# Patient Record
Sex: Male | Born: 1945
Health system: Southern US, Community
[De-identification: ages and names within clinical notes are randomized; demographics above are authoritative.]

## PROBLEM LIST (undated history)

## (undated) DIAGNOSIS — K284 Chronic or unspecified gastrojejunal ulcer with hemorrhage: Secondary | ICD-10-CM

## (undated) DIAGNOSIS — I10 Essential (primary) hypertension: Secondary | ICD-10-CM

## (undated) DIAGNOSIS — G510 Bell's palsy: Secondary | ICD-10-CM

## (undated) DIAGNOSIS — K219 Gastro-esophageal reflux disease without esophagitis: Secondary | ICD-10-CM

## (undated) DIAGNOSIS — E669 Obesity, unspecified: Secondary | ICD-10-CM

## (undated) DIAGNOSIS — H9190 Unspecified hearing loss, unspecified ear: Secondary | ICD-10-CM

## (undated) DIAGNOSIS — E119 Type 2 diabetes mellitus without complications: Secondary | ICD-10-CM

## (undated) DIAGNOSIS — E785 Hyperlipidemia, unspecified: Secondary | ICD-10-CM

## (undated) DIAGNOSIS — D509 Iron deficiency anemia, unspecified: Secondary | ICD-10-CM

## (undated) DIAGNOSIS — R0602 Shortness of breath: Secondary | ICD-10-CM

## (undated) DIAGNOSIS — K274 Chronic or unspecified peptic ulcer, site unspecified, with hemorrhage: Secondary | ICD-10-CM

## (undated) HISTORY — PX: FOOT SURGERY: SHX648

## (undated) HISTORY — DX: Unspecified hearing loss, unspecified ear: H91.90

---

## 2002-10-06 ENCOUNTER — Encounter: Payer: Self-pay | Admitting: Emergency Medicine

## 2002-10-06 ENCOUNTER — Emergency Department (HOSPITAL_COMMUNITY): Admission: EM | Admit: 2002-10-06 | Discharge: 2002-10-06 | Payer: Self-pay | Admitting: Emergency Medicine

## 2004-03-21 ENCOUNTER — Emergency Department (HOSPITAL_COMMUNITY): Admission: EM | Admit: 2004-03-21 | Discharge: 2004-03-21 | Payer: Self-pay | Admitting: Emergency Medicine

## 2004-07-06 ENCOUNTER — Ambulatory Visit (HOSPITAL_COMMUNITY): Admission: RE | Admit: 2004-07-06 | Discharge: 2004-07-06 | Payer: Self-pay | Admitting: Internal Medicine

## 2004-08-04 ENCOUNTER — Ambulatory Visit (HOSPITAL_COMMUNITY): Admission: RE | Admit: 2004-08-04 | Discharge: 2004-08-04 | Payer: Self-pay | Admitting: Internal Medicine

## 2004-08-10 ENCOUNTER — Ambulatory Visit (HOSPITAL_COMMUNITY): Admission: RE | Admit: 2004-08-10 | Discharge: 2004-08-10 | Payer: Self-pay | Admitting: Internal Medicine

## 2006-09-28 ENCOUNTER — Encounter: Payer: Self-pay | Admitting: Emergency Medicine

## 2006-09-28 ENCOUNTER — Ambulatory Visit: Payer: Self-pay | Admitting: Cardiology

## 2006-09-28 ENCOUNTER — Inpatient Hospital Stay (HOSPITAL_COMMUNITY): Admission: AD | Admit: 2006-09-28 | Discharge: 2006-09-29 | Payer: Self-pay | Admitting: Cardiology

## 2006-10-06 ENCOUNTER — Ambulatory Visit (HOSPITAL_COMMUNITY): Admission: RE | Admit: 2006-10-06 | Discharge: 2006-10-06 | Payer: Self-pay | Admitting: Internal Medicine

## 2006-10-06 ENCOUNTER — Ambulatory Visit: Payer: Self-pay | Admitting: Internal Medicine

## 2006-10-06 HISTORY — PX: COLONOSCOPY: SHX174

## 2007-02-02 ENCOUNTER — Emergency Department (HOSPITAL_COMMUNITY): Admission: EM | Admit: 2007-02-02 | Discharge: 2007-02-02 | Payer: Self-pay | Admitting: Emergency Medicine

## 2007-05-09 ENCOUNTER — Ambulatory Visit (HOSPITAL_COMMUNITY): Admission: RE | Admit: 2007-05-09 | Discharge: 2007-05-09 | Payer: Self-pay | Admitting: Ophthalmology

## 2007-05-23 ENCOUNTER — Ambulatory Visit (HOSPITAL_COMMUNITY): Admission: RE | Admit: 2007-05-23 | Discharge: 2007-05-23 | Payer: Self-pay | Admitting: Ophthalmology

## 2007-12-05 ENCOUNTER — Ambulatory Visit (HOSPITAL_COMMUNITY): Admission: RE | Admit: 2007-12-05 | Discharge: 2007-12-05 | Payer: Self-pay | Admitting: Internal Medicine

## 2008-05-17 ENCOUNTER — Emergency Department (HOSPITAL_COMMUNITY): Admission: EM | Admit: 2008-05-17 | Discharge: 2008-05-17 | Payer: Self-pay | Admitting: Emergency Medicine

## 2008-11-29 ENCOUNTER — Emergency Department (HOSPITAL_COMMUNITY): Admission: EM | Admit: 2008-11-29 | Discharge: 2008-11-29 | Payer: Self-pay | Admitting: Emergency Medicine

## 2009-09-09 ENCOUNTER — Emergency Department (HOSPITAL_COMMUNITY): Admission: EM | Admit: 2009-09-09 | Discharge: 2009-09-09 | Payer: Self-pay | Admitting: Emergency Medicine

## 2009-12-06 ENCOUNTER — Emergency Department (HOSPITAL_COMMUNITY): Admission: EM | Admit: 2009-12-06 | Discharge: 2009-12-06 | Payer: Self-pay | Admitting: Emergency Medicine

## 2010-01-14 ENCOUNTER — Emergency Department (HOSPITAL_COMMUNITY): Admission: EM | Admit: 2010-01-14 | Discharge: 2010-01-14 | Payer: Self-pay | Admitting: Emergency Medicine

## 2010-01-14 ENCOUNTER — Encounter: Payer: Self-pay | Admitting: Orthopedic Surgery

## 2010-01-15 ENCOUNTER — Encounter (INDEPENDENT_AMBULATORY_CARE_PROVIDER_SITE_OTHER): Payer: Self-pay | Admitting: *Deleted

## 2010-01-15 ENCOUNTER — Ambulatory Visit: Payer: Self-pay | Admitting: Orthopedic Surgery

## 2010-01-15 DIAGNOSIS — S93306A Unspecified dislocation of unspecified foot, initial encounter: Secondary | ICD-10-CM | POA: Insufficient documentation

## 2010-01-21 ENCOUNTER — Encounter: Payer: Self-pay | Admitting: Orthopedic Surgery

## 2010-02-20 ENCOUNTER — Emergency Department (HOSPITAL_COMMUNITY): Admission: EM | Admit: 2010-02-20 | Discharge: 2010-02-20 | Payer: Self-pay | Admitting: Emergency Medicine

## 2010-02-23 ENCOUNTER — Emergency Department (HOSPITAL_COMMUNITY): Admission: EM | Admit: 2010-02-23 | Discharge: 2010-02-23 | Payer: Self-pay | Admitting: Emergency Medicine

## 2010-05-27 ENCOUNTER — Inpatient Hospital Stay (HOSPITAL_COMMUNITY): Admission: EM | Admit: 2010-05-27 | Discharge: 2010-05-29 | Payer: Self-pay | Admitting: Emergency Medicine

## 2010-10-27 NOTE — Letter (Signed)
Summary: History form  History form   Imported By: Ruffin Pyo 01/20/2010 11:44:01  _____________________________________________________________________  External Attachment:    Type:   Image     Comment:   External Document

## 2010-10-27 NOTE — Miscellaneous (Signed)
Summary: [name] appt 01/21/10 245pm, faxed notes  Clinical Lists Changes

## 2010-10-27 NOTE — Consult Note (Signed)
Summary: Consult note from Dr. Romualdo Bolk  Consult note from Dr. Romualdo Bolk   Imported By: Ruffin Pyo 02/03/2010 09:56:26  _____________________________________________________________________  External Attachment:    Type:   Image     Comment:   External Document

## 2010-10-27 NOTE — Letter (Signed)
Summary: Referral faxed to Dr Beola Cord  Referral faxed to Dr Beola Cord   Imported By: Ihor Austin 02/17/2010 18:00:32  _____________________________________________________________________  External Attachment:    Type:   Image     Comment:   External Document

## 2010-10-27 NOTE — Assessment & Plan Note (Signed)
Summary: [facility] ER FOL/UP/FX FOOT/XRAY [facility]/MEDICARE,MEDICAID/CAF   Visit Type:  new patient Referring Provider:  ap er Primary Provider:  Dr. Legrand Rams  CC:  right foot fracture.  History of Present Illness: I saw [name] in the office today for an initial visit.  He is a 65 years old man with the complaint of: right foot fracture.  DOI 01/14/10 slipped on water.  AP er 01/14/10.  Lives at [facility].  independent ambulator with no assistive devices   Meds: Niaspan, Lasix, Benazepril, Ranitidine, Atenolol, Certirizine, ASA, Norco 5, Potassium, Arthritis pain relief, Simvastatin, Eye ointment.  He has been putting some weight on the foot when he moves from chair to bed, etc.  Lisfranc Injury   Allergies (verified): No Known Drug Allergies  Past History:  Past Medical History: htn bells palsy obesity ulcers cholesterol  Past Surgical History: na  Family History: FH of Cancer:  Family History of Diabetes  Social History: Patient is single.  unemployed no smoking no alcohol 2 cups of coffee per day  Review of Systems Constitutional:  Denies weight loss, weight gain, fever, chills, and fatigue. Cardiovascular:  Denies chest pain, palpitations, fainting, and murmurs. Respiratory:  Denies short of breath, wheezing, couch, tightness, pain on inspiration, and snoring . Gastrointestinal:  Complains of diarrhea; denies heartburn, nausea, vomiting, constipation, and blood in your stools. Genitourinary:  Denies frequency, urgency, difficulty urinating, painful urination, flank pain, and bleeding in urine. Neurologic:  Denies numbness, tingling, unsteady gait, dizziness, tremors, and seizure. Musculoskeletal:  Complains of joint pain and swelling; denies instability, stiffness, redness, heat, and muscle pain. Endocrine:  Complains of excessive thirst; denies exessive urination and heat or cold intolerance. Psychiatric:  Denies nervousness, depression, anxiety, and  hallucinations. Skin:  Denies changes in the skin, poor healing, rash, itching, and redness. HEENT:  Denies blurred or double vision, eye pain, redness, and watering. Immunology:  Denies seasonal allergies, sinus problems, and allergic to bee stings. Hemoatologic:  Denies easy bleeding and brusing.  Physical Exam  Msk:  The patient is well developed and nourished, with normal grooming and hygiene. The body habitus is  large  Pulses:  normal PT  weak DP Extremities:  right foot  swelling moderate   tender in the entire mid foot   ankle ROM is normal    The upper extremities have normal appearance, ROM, strength and stability.    No atrophy in the foot   ankle stable   midfoot stability ? based on the fracture  Neurologic:  normal feeling  in the right foot  Skin:  normal  right foot  Psych:  awake alert    Impression & Recommendations:  Problem # 1:  CLOSED DISLOCATION OF FOOT UNSPECIFIED PART (ICD-838.00) Assessment New  The x-rays were done at [facility]. The report and the films have been reviewed.  Lisfranc fracture dislocation right foot     Orders: Orthopedic Surgeon Referral (Ortho Surgeon) New Patient Level III (99203)/ Dr Beola Cord  post splint applied in Neutral   Medications Added to Medication List This Visit: 1)  Norco 5-325 Mg Tabs (Hydrocodone-acetaminophen) .Marland Kitchen.. 1 by mouth q 4 as needed pain  Patient Instructions: 1)  Referral to Dr. Beola Cord for definitive fixation  2)  Please schedule a follow-up appointment as needed. Prescriptions: NORCO 5-325 MG TABS (HYDROCODONE-ACETAMINOPHEN) 1 by mouth q 4 as needed pain  #42 x 5   Entered and Authorized by:   Arther Abbott MD   Signed by:   Arther Abbott MD  on 01/15/2010   Method used:   Print then Give to Patient   RxID:   [id number]

## 2010-12-10 LAB — BASIC METABOLIC PANEL
BUN: 9 mg/dL (ref 6–23)
CO2: 27 mEq/L (ref 19–32)
Calcium: 8.7 mg/dL (ref 8.4–10.5)
Chloride: 102 mEq/L (ref 96–112)
Creatinine, Ser: 1.02 mg/dL (ref 0.4–1.5)
GFR calc Af Amer: >60 mL/min (ref >60)
GFR calc non Af Amer: >60 mL/min (ref >60)
Glucose, Bld: 125 mg/dL — ABNORMAL HIGH (ref 70–99)
Potassium: 3.7 mEq/L (ref 3.5–5.1)
Sodium: 137 mEq/L (ref 135–145)

## 2010-12-10 LAB — DIFFERENTIAL
Basophils Absolute: 0 10*3/uL (ref 0.0–0.1)
Basophils Relative: 0 % (ref 0–1)
Eosinophils Absolute: 0.1 10*3/uL (ref 0.0–0.7)
Eosinophils Relative: 2 % (ref 0–5)
Lymphocytes Relative: 19 % (ref 12–46)
Lymphs Abs: 1.4 10*3/uL (ref 0.7–4.0)
Monocytes Absolute: 0.9 10*3/uL (ref 0.1–1.0)
Monocytes Relative: 12 % (ref 3–12)
Neutro Abs: 5 10*3/uL (ref 1.7–7.7)
Neutrophils Relative %: 67 % (ref 43–77)

## 2010-12-10 LAB — CBC
HCT: 33.4 % — ABNORMAL LOW (ref 39.0–52.0)
Hemoglobin: 10.9 g/dL — ABNORMAL LOW (ref 13.0–17.0)
MCH: 26.1 pg (ref 26.0–34.0)
MCHC: 32.7 g/dL (ref 30.0–36.0)
MCV: 79.8 fL (ref 78.0–100.0)
Platelets: 120 10*3/uL — ABNORMAL LOW (ref 150–400)
RBC: 4.19 MIL/uL — ABNORMAL LOW (ref 4.22–5.81)
RDW: 14.8 % (ref 11.5–15.5)
WBC: 7.4 10*3/uL (ref 4.0–10.5)

## 2010-12-11 LAB — URINALYSIS, ROUTINE W REFLEX MICROSCOPIC
Bilirubin Urine: NEGATIVE
Glucose, UA: NEGATIVE mg/dL
Hgb urine dipstick: NEGATIVE
Ketones, ur: NEGATIVE mg/dL
Leukocytes, UA: NEGATIVE
Nitrite: NEGATIVE
Nitrite: NEGATIVE
Protein, ur: NEGATIVE mg/dL
Protein, ur: NEGATIVE mg/dL
Specific Gravity, Urine: 1.015 (ref 1.005–1.030)
Urobilinogen, UA: 0.2 mg/dL (ref 0.0–1.0)
pH: 5 (ref 5.0–8.0)
pH: 5.5 (ref 5.0–8.0)

## 2010-12-11 LAB — BASIC METABOLIC PANEL
BUN: 14 mg/dL (ref 6–23)
CO2: 25 mEq/L (ref 19–32)
Calcium: 9 mg/dL (ref 8.4–10.5)
Chloride: 99 mEq/L (ref 96–112)
Creatinine, Ser: 1.12 mg/dL (ref 0.4–1.5)
GFR calc Af Amer: >60 mL/min (ref >60)
GFR calc non Af Amer: >60 mL/min (ref >60)
Glucose, Bld: 164 mg/dL — ABNORMAL HIGH (ref 70–99)
Potassium: 3.9 mEq/L (ref 3.5–5.1)
Sodium: 134 mEq/L — ABNORMAL LOW (ref 135–145)

## 2010-12-11 LAB — URINE CULTURE
Culture  Setup Time: 201108311202
Culture: NO GROWTH

## 2010-12-11 LAB — CBC
HCT: 35.9 % — ABNORMAL LOW (ref 39.0–52.0)
Hemoglobin: 11.8 g/dL — ABNORMAL LOW (ref 13.0–17.0)
MCH: 26 pg (ref 26.0–34.0)
MCHC: 32.8 g/dL (ref 30.0–36.0)
MCV: 79.3 fL (ref 78.0–100.0)
Platelets: 134 10*3/uL — ABNORMAL LOW (ref 150–400)
RBC: 4.52 MIL/uL (ref 4.22–5.81)
RDW: 14.2 % (ref 11.5–15.5)
WBC: 13.7 10*3/uL — ABNORMAL HIGH (ref 4.0–10.5)

## 2010-12-11 LAB — DIFFERENTIAL
Basophils Absolute: 0 10*3/uL (ref 0.0–0.1)
Basophils Relative: 0 % (ref 0–1)
Eosinophils Absolute: 0 10*3/uL (ref 0.0–0.7)
Eosinophils Relative: 0 % (ref 0–5)
Lymphocytes Relative: 6 % — ABNORMAL LOW (ref 12–46)
Lymphs Abs: 0.8 10*3/uL (ref 0.7–4.0)
Monocytes Absolute: 0.8 10*3/uL (ref 0.1–1.0)
Monocytes Relative: 6 % (ref 3–12)
Neutro Abs: 12 10*3/uL — ABNORMAL HIGH (ref 1.7–7.7)
Neutrophils Relative %: 88 % — ABNORMAL HIGH (ref 43–77)

## 2010-12-11 LAB — MRSA PCR SCREENING: MRSA by PCR: POSITIVE — AB

## 2010-12-11 LAB — LACTIC ACID, PLASMA: Lactic Acid, Venous: 3.6 mmol/L — ABNORMAL HIGH (ref 0.5–2.2)

## 2010-12-11 LAB — CULTURE, BLOOD (ROUTINE X 2)

## 2010-12-20 LAB — GLUCOSE, CAPILLARY: Glucose-Capillary: 134 mg/dL — ABNORMAL HIGH (ref 70–99)

## 2010-12-29 LAB — DIFFERENTIAL
Basophils Absolute: 0.1 10*3/uL (ref 0.0–0.1)
Lymphocytes Relative: 39 % (ref 12–46)
Monocytes Absolute: 0.6 10*3/uL (ref 0.1–1.0)
Monocytes Relative: 11 % (ref 3–12)
Neutro Abs: 2.6 10*3/uL (ref 1.7–7.7)

## 2010-12-29 LAB — TYPE AND SCREEN
Antibody Screen: POSITIVE
DAT, IgG: NEGATIVE

## 2010-12-29 LAB — BASIC METABOLIC PANEL
Calcium: 8.7 mg/dL (ref 8.4–10.5)
GFR calc Af Amer: >60 mL/min (ref >60)
GFR calc non Af Amer: >60 mL/min (ref >60)
Sodium: 137 mEq/L (ref 135–145)

## 2010-12-29 LAB — CBC
Hemoglobin: 14 g/dL (ref 13.0–17.0)
RBC: 4.62 MIL/uL (ref 4.22–5.81)

## 2011-02-12 NOTE — Op Note (Signed)
[name], [name]                   ACCOUNT NO.:  [id number]   MEDICAL RECORD NO.:  [id number]          PATIENT TYPE:  AMB   LOCATION:  DAY                           FACILITY:  [facility]   PHYSICIAN:  R. Garfield Cornea, M.D. DATE OF BIRTH:  1946/07/07   DATE OF PROCEDURE:  10/06/2006  DATE OF DISCHARGE:                               OPERATIVE REPORT   Screening colonoscopy.   INDICATIONS FOR PROCEDURE:  The patient is a 65 year old gentleman.  He  tells me he has never had a colonoscopy.  He has no lower GI tract  symptoms.  No family history colorectal neoplasia.  He comes for  screening.  He is referred at courtesy of Dr. Legrand Rams.  Screening  colonoscopy is now being done.  This approach has been discussed the  patient at length.  Potential risks, benefits and alternatives have been  reviewed, questions answered.  Please see the documentation in the  medical record.   PROCEDURE NOTE:  O2 saturation, blood pressure, pulse and respiration  were monitored throughout the entire procedure.  Conscious sedation with  Versed 2 mg IV, Demerol 50 mg IV in a single dose.   INSTRUMENT:  Pentax video chip system.   FINDINGS:  Digital rectal exam revealed no abnormalities.   ENDOSCOPIC FINDINGS:  Unfortunately, prep was suboptimal with vegetable  matter and viscous stool intermittently lining the entire colon and  rectum.  This made the exam more difficult, and the finer mucosal detail  of the entire rectal and colonic mucosa was not visualized because of  this deficient prep.   Colon:  Colonic mucosa was surveyed from rectosigmoid junction through  the left, transverse and right colon to the appendiceal orifice,  ileocecal valve and cecum.  These structures were seen and photographed  for the record.  From this level, the scope was slowly withdrawn, and  all previously mentioned mucosal surfaces were again seen.  The colonic  mucosa appeared normal which was seen.  However, because of the poor  prep, we had to turn the patient from side-to-side and wash and suction.  The scope kept getting clogged up.  There were no gross colonic mucosal  abnormalities seen.  Scope was pulled down into the rectum, again same  challenge with the prep.  Retroflexed view of the distal rectal revealed  anal papilla and some internal hemorrhoids.  We moved the patient around  to get the fluid column to move so that the rectal mucosa was seen  fairly well.  However, the prep left a lot to be desired.  The patient  tolerated the procedure well and was reactive to endoscopy.   IMPRESSION:  Grossly normal-appearing rectum and colon aside from  internal hemorrhoids and anal papilla.  Poor prep made the exam more  challenging.  Small polyp or other lesion may not have been seen today  because of the prep.   RECOMMENDATIONS:  Would bring him back a little early, i.e. 5 years, for  repeat screening colonoscopy.      Bridgette Habermann, M.D.  Electronically Signed  RMR/MEDQ  D:  10/06/2006  T:  10/06/2006  Job:  [id number]   cc:   Tesfaye D. Legrand Rams, MD  Fax: [phone]

## 2011-02-12 NOTE — Procedures (Signed)
NAMEJOSEJESUS, [name]                   ACCOUNT NO.:  [id number]   MEDICAL RECORD NO.:  [id number]          PATIENT TYPE:  OUT   LOCATION:  RAD                           FACILITY:  [facility]   PHYSICIAN:  Leslye Peer, MD       DATE OF BIRTH:  11/15/1945   DATE OF PROCEDURE:  DATE OF DISCHARGE:                                  ECHOCARDIOGRAM   REFERRING PHYSICIAN:  Dr. Brandon Melnick D. Fanta.   HISTORY OF PRESENT ILLNESS:  Mr. Billips is a 65 year old gentleman with a past  medical history of hypertension and shortness of breath.   TECHNICAL QUALITY:  The technical quality of the study is adequate.   FINDINGS:  1.  The aorta is within normal limits at 3.6 cm.  2.  The left atrium is mildly dilated at 4.1 cm.  No obvious clots or masses      were appreciated, and the patient appeared to be in sinus rhythm during      this procedure.  3.  The intraventricular septum and posterior wall are mildly thickened at      1.4 cm for each.  There is additionally basal septal hypertrophy noted.  4.  The aortic valve is thin, trileaflet and pliable with normal leaflet      excursion.  No significant aortic insufficiency is noted.  Doppler      interrogation of the aortic valve is within normal limits.  5.  The mitral valve is minimally thickened, but with normal leaflet      excursion.  There is mild prolapse of the posterior leaflet.  There is      an eccentric posteriorly directed jet of mitral regurgitation which      appears mild in most views, but in the apical four-chamber view, does      suggest a more moderate degree.  There is no obvious pulmonary vein flow      reversal noted.  Doppler interrogation of the mitral valve is within      normal limits. Mild mitral annular calcification is noted.  6.  The pulmonic valve appears grossly structurally normal with trivial      pulmonic insufficiency noted.  7.  The tricuspid valve also appears grossly structurally normal with mild      tricuspid regurgitation  noted.  8.  The left ventricle is normal in size with the LVIDD measured at 4.9 cm,      and the LVISD measured at 3.6 cm.  Overall, left ventricular systolic      function appears normal.  In one view only, (apical four-chamber), at      the apex, there is a subtle suggestion of apical hypokinesis.  However,      this is not confirmed in multiple views, and the apex is not well      visualized.  Again, overall left ventricular systolic function is      normal.  The presence of diastolic dysfunction is inferred from pulse      wave Doppler across the mitral valve.  9.  The right  atrium and right ventricle are both mildly generous. The right      ventricular systolic function appears to be preserved.  10. Small anterior echo-free space which may represent fat pad versus a      small anterior pericardial effusion without evidence of hemodynamic      compromise.   IMPRESSION:  1.  Mild biatrial enlargement.  2.  Mild concentric left ventricular hypertrophy.  3.  Mild prolapse of the posterior leaflet of the mitral valve.  4.  An eccentric posteriorly directed jet of mitral regurgitation which      appears mild in most views, but due to its eccentricity, could be      moderate.  5.  Trivial pulmonic insufficiency.  6.  Mild mitral annular calcification.  7.  Mild tricuspid regurgitation.  8.  Normal left ventricular size with overall normal ejection fraction.  In      the apical four-chamber view only, the apex, although not well      visualized, has a subtle suggestion of hypokinesis which is not      appreciated in any other view.  9.  The presence of diastolic dysfunction is inferred from pulse wave      Doppler across the mitral valve.  10. The right ventricle is mildly generous but with preserved right      ventricular systolic function.  11. Small anterior echo-free space which may represent fat pad versus a      small anterior pericardial effusion without evidence of  hemodynamic      compromise.      AB/MEDQ  D:  08/04/2004  T:  08/04/2004  Job:  [id number]   cc:   Tesfaye D. Legrand Rams, [address]  [place]  [place] [address]  Fax: [phone]

## 2011-02-12 NOTE — H&P (Signed)
[name], BOULOS NO.:  [id number]   MEDICAL RECORD NO.:  [id number]          PATIENT TYPE:  INP   LOCATION:  [id number]                         FACILITY:  [facility]   PHYSICIAN:  Ethelle Lyon, MD  DATE OF BIRTH:  08/18/1946   DATE OF ADMISSION:  09/28/2006  DATE OF DISCHARGE:                              HISTORY & PHYSICAL   SUMMARY OF HISTORY:  [name] is a 65 year old white male who was a  resident of [facility] in [place], [place]. He  was transported via [facility] from [facility] to [facility] for cardiac evaluation.  According to [name], he describes  and points to the epigastric xiphoid area and describes a sudden onset  of stabbing discomfort between breakfast and lunch today. He states the  discomfort did not radiate, nor was it associated with and shortness of  breath, nausea, vomiting, diaphoresis, burping, flatulence. He denies  prior occurrences. He denies recent accidents, injuries, lifting  problems, or falls. He is not specific as to the time of onset, except  that it occurred between breakfast and lunch. He is not clear about the  duration, but he states that it was gone before the ambulance arrived to  [facility]. For breakfast he ate his usual eggs and toast  without difficulty.  He denies any exertional fatigue, shortness of  breath, or chest discomfort.  He states that he has been residing in the  nursing home since his parents' death.  It is not clear why he resides  in nursing home from reports that are available.  History is somewhat  limited.   According to [facility] Room, he was having discomfort upon  their arrival, and it was relieved with one sublingual nitroglycerin.  However, on questioning, [name] does not recall receiving any  medications at the emergency room.   PAST MEDICAL HISTORY:  No known drug allergies.   MEDICATIONS:  Based on the nursing home list,  include:  1. Albuterol inhaler q.i.d.  2. Tylenol b.i.d.  3. Atenolol 50 mg daily.  4. Lotensin 10 mg daily.  5. Lasix 40 mg daily.  6. Claritin 10 mg daily.  7. Niaspan 500 mg at bedtime.  8. Zantac 150 daily.  9. Aspirin 81 daily.  10.Zetia 10 mg daily.  11.DuoNebs p.r.n. shortness of breath.   HIS PAST MEDICAL HISTORY:  1. Hypertension.  2. Bell's palsy.  3. Obesity.  4. Hyperlipidemia.  5. He states that he was hospitalized at [facility] long time      ago for bleeding ulcers.  6. He denies any surgeries.  7. He denies any history of diabetes, CVA, myocardial infarction,      COPD, bleeding dyscrasias, or thyroid disorder.   SOCIAL HISTORY:  He resides in [place] at [facility].  He is on disability.  He has never worked. He has no children.  He  states that he smoked a long time ago, approximately one pack per day,  for unknown duration.  He denies any  alcohol or drugs, herbal  medications.  The nursing home maintains a low salt diet.  He states  that the nursing home makes him exercises every day.   FAMILY HISTORY:  Both his parents are deceased.  He states that his  mother was in a nursing home, and his father was killed in a car  accident. Ages and history are unknown.  He states that he had four  brothers and sisters that are all deceased with some type of blood  disease.   REVIEW OF SYSTEMS:  In addition to the above, is notable for edentulous,  positive snoring, nocturia, rare diarrhea.   PHYSICAL EXAMINATION:  GENERAL:  Well-nourished, well-developed,  pleasant obese white male in no apparent distress.  VITAL SIGNS:  Temperature 97.4. Blood pressure initially at [facility]  was 150/84, is now 126/76.  Pulse rate was 91, now at this time it is  approximately 76. Respirations 80, 97% saturation on room air.  HEENT:  Grossly unremarkable, except for edentulous.  NECK:  Supple, without thyromegaly, masses, JVD, or carotid bruits.  CHEST:   Symmetrical excursion.  Lungs sounds were diminished but clear  to auscultation.  '  HEART:  PMI is not displaced.  He has a regular rate and rhythm, with  somewhat distant heart sounds.  I did not appreciate any rubs, clicks,  gallops, S3, or S4.  All peripheral pulses are symmetrical and intact.  I did not appreciate any abdominal or femoral bruits.  SKIN/INTEGUMENT:  Appears to be intact.  ABDOMEN:  Obese.  Bowel sounds present, without organomegaly, masses, or  tenderness.  EXTREMITIES:  Negative cyanosis or clubbing.  He does have approximately  1+ pitting lower extremity edema.  MUSCULOSKELETAL:  Grossly unremarkable.  NEUROLOGIC:  Alert and oriented.  Cranial nerves II-XII are grossly  intact.   At [facility], chest x-ray did not show any active disease.  EKG initially shows a lot of artifact, but appears to be sinus rhythm,  with a ventricular rate and 90, left axis deviation, early R, no acute  changes.  Second EKG shows limb lead reversal, but no acute changes.  H&H is 15.8 and 45.2, normal indices, platelets 167, WBC 6.8. Sodium  136, potassium 3.6, BUN 13, creatinine 1.1, glucose 102, calcium 9.2.  Point of care marker negative x1.   IMPRESSION:  1. Atypical chest or abdominal discomfort, with a negative EKG and      point of care marker. Second EKG is incorrectly applied.  2. Hypertension, improved.  3. History per past medical history.   DISPOSITION:  Dr. Albertine Patricia reviewed the patient's history, spoke with, and  examined the patient, and agrees with the above. We will keep him  overnight to rule out myocardial infarction. Continue his home  medications.  Will discontinue the IV heparin, as this does not sound  ischemic.  Will order a right upper quadrant ultrasound to rule out  cholelithiasis.  Involve the case manager for discharge needs, as he  comes from a nursing home..  We will perform an adenosine Myoview in the morning if his enzymes are negative.  If  this adenosine Myoview is  negative,  anticipate discharge back to [facility].  We will give him  an extra dose of Lasix to assist with his pedal edema and recheck a  potassium in the morning.  He is on Zantac at home.  We will change this  to Protonix 40 mg p.o. daily.  Sharyl Nimrod, PA-C      Ethelle Lyon, MD  Electronically Signed    EW/MEDQ  D:  09/28/2006  T:  09/28/2006  Job:  [id number]   cc:   Tesfaye D. Legrand Rams, MD

## 2011-02-12 NOTE — Discharge Summary (Signed)
[name], PADGET                   ACCOUNT NO.:  [id number]   MEDICAL RECORD NO.:  [id number]          PATIENT TYPE:  INP   LOCATION:  [id number]                         FACILITY:  [facility]   PHYSICIAN:  [name], M.D.             DATE OF BIRTH:  20-Mar-1946   DATE OF ADMISSION:  09/28/2006  DATE OF DISCHARGE:  09/29/2006                               DISCHARGE SUMMARY   DISCHARGE DIAGNOSES:  1. Chest discomfort of uncertain etiology.  2. Hypertension.  3. Dyslipidemia with significantly elevated triglycerides.  4. Unknown history requiring nursing home admission post parents      death.   SUMMARY OF HISTORY:  [name] is a 65 year old white male who is a  resident of [facility] in [place], [place].  He  was transported via ambulance from the nursing home to [facility] with reported chest discomfort and then from [facility] to [facility] for further cardiac evaluation.  However, according to Mr.  Howren he described and pointed at the gastric xiphoid area described a  sudden onset of stabbing sensation sometime between breakfast and lunch  on the day of admission.  The discomfort did not radiate, nor was it  associated with shortness of breath, nausea, vomiting, diaphoresis,  burping or flatulence.  He denied prior occurrences, recent injuries.  The patient is not clear the time of onset or the duration, however, he  states that it was gone prior to arriving to [facility].  Based  on records that are available it is not clear why he is living in a  nursing home.  He states that when his parents died he was moved to the  nursing home.   PAST MEDICAL HISTORY:  1. Hypertension.  2. Bell's palsy.  3. Obesity.  4. Hyperlipidemia.  5. Remote bleeding ulcers requiring hospitalization at [facility] per the patient.  6. Remote tobacco use.   LABORATORY DATA:  Chest x-ray did not show any acute findings.  Abdominal ultrasound on the  third did not show any evidence of  gallstones or biliary dilatation.  Diffuse fatty infiltration of the  liver.  Stress Myoview performed on the third showed EF of 54%, normal  wall motion and no findings of ischemia.  EKG's from [facility]  showed baseline artifact on the initial EKG, normal sinus rhythm, left  axis deviation. No acute abnormalities.  The second EKG from [facility] did not show any acute abnormalities, but it was noted that the  arm leads were reversed.  Admission weight was not documented.  H&H was  15.8 and 45.2.  Normal indices.  Platelets 167, WBC's 6.8, PTT is 66, PT  14.1, sodium 135, potassium 3.5, BUN 13, creatinine 0.83.  Normal LFTs  except for an AST was slightly elevated at 44.  Amylase and lipase were  36 and 23 respectively.  Hemoglobin A1C 5.6.  CK-MB's relative index and  troponin's were negative for myocardial infarction.  Fasting lipids  showed a total cholesterol of 114, triglycerides 583, HDL 27, LDL was  not calculated.  TSH was 2.02.   HOSPITAL COURSE:  [name] was admitted to 38 for further evaluation of  his chest discomfort.  Overnight he ruled out for myocardial infarction.  Did not have any further chest or abdominal discomfort.  Abdominal  ultrasound and Adenosine Myoview were performed with the previously  mentioned results.  It is felt that if these were unremarkable that he  could be discharged back to the nursing home with outpatient follow up  with Dr. Legrand [name].   DISPOSITION:  The patient is discharged back home to the [facility] home. His medications remain unchanged.  These include albuterol  inhaler 4x a day, Tylenol b.i.d., Atenolol 50 mg daily, Lotensin 10 mg  daily, Lasix 40 mg daily, Claritin 10 mg daily, Niaspan 500 mg q.h.s.,  Zantac 150 mg daily, aspirin 81 mg daily, Zetia 10 mg daily and DuoNeb  as needed for shortness of breath as previously.  We will also add a  baby aspirin given his cardiac  risk factors.  He is asked to call Dr.  Legrand [name] to arrange follow up appointment to follow up on hypertension and  his hypertriglyceridemia.  Consideration should be given to increasing  his Niaspan to 1000 mg q.h.s. and considering adding Tricor.   DISCHARGE TIME:  Twenty-five minutes.      Douglas Nimrod, PA-C    ______________________________  [name], M.D.    EW/MEDQ  D:  09/29/2006  T:  09/29/2006  Job:  [id number]   cc:   Douglas D. Legrand Rams, MD  Douglas Bjornstad, MD, [facility]

## 2011-03-25 ENCOUNTER — Ambulatory Visit (HOSPITAL_COMMUNITY)
Admission: RE | Admit: 2011-03-25 | Discharge: 2011-03-25 | Disposition: A | Discharge status: Home / Self Care | Payer: Medicaid Other | Source: Ambulatory Visit | Attending: Internal Medicine | Admitting: Internal Medicine

## 2011-03-25 ENCOUNTER — Other Ambulatory Visit (HOSPITAL_COMMUNITY): Payer: Self-pay | Admitting: Internal Medicine

## 2011-03-25 DIAGNOSIS — M25569 Pain in unspecified knee: Secondary | ICD-10-CM | POA: Insufficient documentation

## 2011-04-01 ENCOUNTER — Encounter (HOSPITAL_COMMUNITY): Payer: Self-pay | Admitting: Radiology

## 2011-04-01 ENCOUNTER — Emergency Department (HOSPITAL_COMMUNITY): Payer: Medicare Other

## 2011-04-01 ENCOUNTER — Emergency Department (HOSPITAL_COMMUNITY)
Admission: EM | Admit: 2011-04-01 | Discharge: 2011-04-01 | Disposition: A | Discharge status: Home / Self Care | Payer: Medicare Other | Attending: Emergency Medicine | Admitting: Emergency Medicine

## 2011-04-01 DIAGNOSIS — Y9210 Unspecified residential institution as the place of occurrence of the external cause: Secondary | ICD-10-CM | POA: Insufficient documentation

## 2011-04-01 DIAGNOSIS — S20219A Contusion of unspecified front wall of thorax, initial encounter: Secondary | ICD-10-CM | POA: Insufficient documentation

## 2011-04-01 DIAGNOSIS — R296 Repeated falls: Secondary | ICD-10-CM | POA: Insufficient documentation

## 2011-04-01 DIAGNOSIS — I10 Essential (primary) hypertension: Secondary | ICD-10-CM | POA: Insufficient documentation

## 2011-04-01 DIAGNOSIS — R109 Unspecified abdominal pain: Secondary | ICD-10-CM | POA: Insufficient documentation

## 2011-04-01 HISTORY — DX: Essential (primary) hypertension: I10

## 2011-04-01 LAB — CBC
MCH: 26.1 pg (ref 26.0–34.0)
Platelets: 166 10*3/uL (ref 150–400)
RBC: 4.67 MIL/uL (ref 4.22–5.81)
RDW: 14.9 % (ref 11.5–15.5)

## 2011-04-01 LAB — BASIC METABOLIC PANEL
CO2: 28 mEq/L (ref 19–32)
Calcium: 9.5 mg/dL (ref 8.4–10.5)
Creatinine, Ser: 1.04 mg/dL (ref 0.50–1.35)
GFR calc Af Amer: >60 mL/min (ref >60)
GFR calc non Af Amer: >60 mL/min (ref >60)
Sodium: 139 mEq/L (ref 135–145)

## 2011-04-01 LAB — DIFFERENTIAL
Basophils Relative: 0 % (ref 0–1)
Eosinophils Absolute: 0 10*3/uL (ref 0.0–0.7)
Eosinophils Relative: 1 % (ref 0–5)
Monocytes Relative: 12 % (ref 3–12)
Neutrophils Relative %: 62 % (ref 43–77)

## 2011-04-01 MED ORDER — IOHEXOL 300 MG/ML  SOLN
100.0000 mL | Freq: Once | INTRAMUSCULAR | Status: AC | PRN
  Administered 2011-04-01: 100 mL via INTRAVENOUS

## 2011-04-08 ENCOUNTER — Other Ambulatory Visit (HOSPITAL_COMMUNITY): Payer: Self-pay | Admitting: Orthopaedic Surgery

## 2011-04-08 DIAGNOSIS — M25562 Pain in left knee: Secondary | ICD-10-CM

## 2011-04-12 ENCOUNTER — Ambulatory Visit (HOSPITAL_COMMUNITY)
Admission: RE | Admit: 2011-04-12 | Discharge: 2011-04-12 | Disposition: A | Discharge status: Home / Self Care | Payer: Medicare Other | Source: Ambulatory Visit | Attending: Orthopaedic Surgery | Admitting: Orthopaedic Surgery

## 2011-04-12 DIAGNOSIS — M7120 Synovial cyst of popliteal space [Baker], unspecified knee: Secondary | ICD-10-CM | POA: Insufficient documentation

## 2011-04-12 DIAGNOSIS — M25569 Pain in unspecified knee: Secondary | ICD-10-CM | POA: Insufficient documentation

## 2011-04-12 DIAGNOSIS — M23329 Other meniscus derangements, posterior horn of medial meniscus, unspecified knee: Secondary | ICD-10-CM | POA: Insufficient documentation

## 2011-04-12 DIAGNOSIS — M25562 Pain in left knee: Secondary | ICD-10-CM

## 2011-05-28 ENCOUNTER — Other Ambulatory Visit (HOSPITAL_COMMUNITY): Payer: Self-pay | Admitting: Orthopaedic Surgery

## 2011-05-28 DIAGNOSIS — M25561 Pain in right knee: Secondary | ICD-10-CM

## 2011-05-29 ENCOUNTER — Emergency Department (HOSPITAL_COMMUNITY)
Admission: EM | Admit: 2011-05-29 | Discharge: 2011-05-29 | Disposition: A | Discharge status: Other Acute Inpatient Hospital | Payer: Medicare Other | Attending: Emergency Medicine | Admitting: Emergency Medicine

## 2011-05-29 ENCOUNTER — Emergency Department (HOSPITAL_COMMUNITY): Payer: Medicare Other

## 2011-05-29 ENCOUNTER — Encounter (HOSPITAL_COMMUNITY): Payer: Self-pay

## 2011-05-29 DIAGNOSIS — M542 Cervicalgia: Secondary | ICD-10-CM

## 2011-05-29 DIAGNOSIS — I10 Essential (primary) hypertension: Secondary | ICD-10-CM | POA: Insufficient documentation

## 2011-05-29 DIAGNOSIS — M549 Dorsalgia, unspecified: Secondary | ICD-10-CM

## 2011-05-29 DIAGNOSIS — M545 Low back pain, unspecified: Secondary | ICD-10-CM | POA: Insufficient documentation

## 2011-05-29 DIAGNOSIS — W1789XA Other fall from one level to another, initial encounter: Secondary | ICD-10-CM | POA: Insufficient documentation

## 2011-05-29 DIAGNOSIS — Z9181 History of falling: Secondary | ICD-10-CM

## 2011-05-29 DIAGNOSIS — Y9210 Unspecified residential institution as the place of occurrence of the external cause: Secondary | ICD-10-CM | POA: Insufficient documentation

## 2011-05-29 HISTORY — DX: Shortness of breath: R06.02

## 2011-05-29 HISTORY — DX: Bell's palsy: G51.0

## 2011-05-29 HISTORY — DX: Obesity, unspecified: E66.9

## 2011-05-29 HISTORY — DX: Hyperlipidemia, unspecified: E78.5

## 2011-05-29 HISTORY — DX: Chronic or unspecified gastrojejunal ulcer with hemorrhage: K28.4

## 2011-05-29 HISTORY — DX: Chronic or unspecified peptic ulcer, site unspecified, with hemorrhage: K27.4

## 2011-05-29 NOTE — ED Notes (Signed)
Pt reports pain in back of neck and upper back after a reported fall while in the bathroom.  Pt denies any LOC or hitting his head when falling.  Pt awake and alert at this time.

## 2011-05-29 NOTE — ED Notes (Signed)
Pt from [facility] for fall today. Pt states he was getting off of toilet when his legs "gave out" pt states he fell onto bathroom floor backwards. Denies hitting head. C/o neck and lower back pain. Denies loc. Pt fully immobilized with c-collar upon arrival. Pt a/o x3. Behavior age appropriate.

## 2011-05-29 NOTE — ED Provider Notes (Signed)
History     CSN: [id number] Arrival date & time: 05/29/2011  7:58 AM  Chief Complaint  Patient presents with  . Fall   Patient is a 65 y.o. male presenting with fall. The history is provided by the patient and the nursing home (pt fell and hurt his back today.  no loc).  Fall The accident occurred 1 to 2 hours ago. The fall occurred while walking. He fell from a height of 1 to 2 ft. He landed on a hard floor. Point of impact: back. Pain location: low back and neck. The pain is at a severity of 2/10. The pain is mild. He was not ambulatory at the scene. Pertinent negatives include no abdominal pain, no bowel incontinence, no nausea, no hematuria and no headaches. Treatment on scene includes a c-collar and a backboard. He has tried nothing for the symptoms.    Past Medical History  Diagnosis Date  . Hypertension   . Obesity   . Bell's palsy   . Bleeding ulcer   . SOB (shortness of breath) on exertion   . Hyperlipemia     Past Surgical History  Procedure Date  . Foot surgery     No family history on file.  History  Substance Use Topics  . Smoking status: Passive Smoker  . Smokeless tobacco: Not on file  . Alcohol Use: No      Review of Systems  Constitutional: Negative for fatigue.  HENT: Negative for congestion, sinus pressure and ear discharge.   Eyes: Negative for discharge.  Respiratory: Negative for cough.   Cardiovascular: Negative for chest pain.  Gastrointestinal: Negative for nausea, abdominal pain, diarrhea and bowel incontinence.  Genitourinary: Negative for frequency and hematuria.  Musculoskeletal: Positive for back pain.       Neck pain  Skin: Negative for rash.  Neurological: Negative for seizures and headaches.  Hematological: Negative.   Psychiatric/Behavioral: Negative for hallucinations.    Physical Exam  BP 131/69  Pulse 87  Temp(Src) 97.6 F (36.4 C) (Oral)  Resp 20  Wt 275 lb (124.739 kg)  SpO2 92%  Physical Exam  Constitutional: He  is oriented to person, place, and time. He appears well-developed.  HENT:  Head: Normocephalic and atraumatic.  Eyes: Conjunctivae and EOM are normal. No scleral icterus.  Neck: Neck supple. No thyromegaly present.  Cardiovascular: Normal rate and regular rhythm.  Exam reveals no gallop and no friction rub.   No murmur heard. Pulmonary/Chest: No stridor. He has no wheezes. He has no rales. He exhibits no tenderness.  Abdominal: He exhibits no distension. There is no tenderness. There is no rebound.  Musculoskeletal: Normal range of motion. He exhibits no edema.       Tender pos neck and mild lumbar tendernousl  Lymphadenopathy:    He has no cervical adenopathy.  Neurological: He is oriented to person, place, and time. He displays normal reflexes. No cranial nerve deficit. He exhibits normal muscle tone. Coordination normal.  Skin: No rash noted. No erythema.  Psychiatric: He has a normal mood and affect. His behavior is normal.    ED Course  Procedures  MDM Fall,  Lumbar and cervicle strain  Results for orders placed during the hospital encounter of 04/01/11  DIFFERENTIAL      Component Value Range   Neutrophils Relative 62  43 - 77 (%)   Neutro Abs 3.8  1.7 - 7.7 (K/uL)   Lymphocytes Relative 25  12 - 46 (%)   Lymphs Abs 1.5  0.7 - 4.0 (K/uL)   Monocytes Relative 12  3 - 12 (%)   Monocytes Absolute 0.7  0.1 - 1.0 (K/uL)   Eosinophils Relative 1  0 - 5 (%)   Eosinophils Absolute 0.0  0.0 - 0.7 (K/uL)   Basophils Relative 0  0 - 1 (%)   Basophils Absolute 0.0  0.0 - 0.1 (K/uL)  CBC      Component Value Range   WBC 6.0  4.0 - 10.5 (K/uL)   RBC 4.67  4.22 - 5.81 (MIL/uL)   Hemoglobin 12.2 (*) 13.0 - 17.0 (g/dL)   HCT 37.8 (*) 39.0 - 52.0 (%)   MCV 80.9  78.0 - 100.0 (fL)   MCH 26.1  26.0 - 34.0 (pg)   MCHC 32.3  30.0 - 36.0 (g/dL)   RDW 14.9  11.5 - 15.5 (%)   Platelets 166  150 - 400 (K/uL)  BASIC METABOLIC PANEL      Component Value Range   Sodium 139  135 - 145  (mEq/L)   Potassium 4.3  3.5 - 5.1 (mEq/L)   Chloride 102  96 - 112 (mEq/L)   CO2 28  19 - 32 (mEq/L)   Glucose, Bld 110 (*) 70 - 99 (mg/dL)   BUN 17  6 - 23 (mg/dL)   Creatinine, Ser 1.04  0.50 - 1.35 (mg/dL)   Calcium 9.5  8.4 - 10.5 (mg/dL)   GFR calc non Af Amer >60  >60 (mL/min)   GFR calc Af Amer >60  >60 (mL/min)   Dg Cervical Spine Complete  05/29/2011  *RADIOLOGY REPORT*  Clinical Data: [place].  CERVICAL SPINE - COMPLETE 4+ VIEW  Comparison: Cervical spine series 05/17/2008.  Findings: Mild degenerative cervical spondylosis.  Normal alignment and no acute bony findings.  The C1-2 articulations are maintained. The lung apices are clear.  IMPRESSION: Normal alignment and no acute bony findings.  Original Report Authenticated By: P. Kalman Jewels, M.D.   Dg Lumbar Spine Complete  05/29/2011  *RADIOLOGY REPORT*  Clinical Data: [place].  Back pain.  LUMBAR SPINE - COMPLETE 4+ VIEW  Comparison: CT scan 04/01/2011.  Findings: Severe degenerative lumbar spondylosis with severe disc disease and facet disease.  No definite acute bony findings.  The visualized bony pelvis is grossly intact.  IMPRESSION:  1.  Severe degenerative lumbar spondylosis. 2.  No definite acute fracture.  Original Report Authenticated By: P. Kalman Jewels, M.D.        Maudry Diego, MD 05/29/11 [id number]

## 2011-05-29 NOTE — ED Notes (Signed)
Dr. Roderic Palau in and examined pt. Pt removed for lsb. c-collar still intact.

## 2011-06-02 ENCOUNTER — Ambulatory Visit (HOSPITAL_COMMUNITY)
Admission: RE | Admit: 2011-06-02 | Discharge: 2011-06-02 | Disposition: A | Discharge status: Home / Self Care | Payer: Medicare Other | Source: Ambulatory Visit | Attending: Orthopaedic Surgery | Admitting: Orthopaedic Surgery

## 2011-06-02 DIAGNOSIS — M25561 Pain in right knee: Secondary | ICD-10-CM

## 2011-06-02 DIAGNOSIS — M23329 Other meniscus derangements, posterior horn of medial meniscus, unspecified knee: Secondary | ICD-10-CM | POA: Insufficient documentation

## 2011-06-02 DIAGNOSIS — M25569 Pain in unspecified knee: Secondary | ICD-10-CM | POA: Insufficient documentation

## 2011-06-02 DIAGNOSIS — M25469 Effusion, unspecified knee: Secondary | ICD-10-CM | POA: Insufficient documentation

## 2011-06-15 ENCOUNTER — Encounter (HOSPITAL_COMMUNITY)
Admission: RE | Admit: 2011-06-15 | Discharge: 2011-06-15 | Disposition: A | Discharge status: Home / Self Care | Payer: Medicare Other | Source: Ambulatory Visit | Attending: Orthopaedic Surgery | Admitting: Orthopaedic Surgery

## 2011-06-15 ENCOUNTER — Emergency Department (HOSPITAL_COMMUNITY): Payer: Medicare Other

## 2011-06-15 ENCOUNTER — Encounter (HOSPITAL_COMMUNITY): Payer: Self-pay

## 2011-06-15 ENCOUNTER — Emergency Department (HOSPITAL_COMMUNITY)
Admission: EM | Admit: 2011-06-15 | Discharge: 2011-06-15 | Disposition: A | Discharge status: Other Acute Inpatient Hospital | Payer: Medicare Other | Attending: Emergency Medicine | Admitting: Emergency Medicine

## 2011-06-15 ENCOUNTER — Encounter (HOSPITAL_COMMUNITY): Payer: Self-pay | Admitting: *Deleted

## 2011-06-15 ENCOUNTER — Other Ambulatory Visit: Payer: Self-pay

## 2011-06-15 DIAGNOSIS — Y9210 Unspecified residential institution as the place of occurrence of the external cause: Secondary | ICD-10-CM | POA: Insufficient documentation

## 2011-06-15 DIAGNOSIS — Z7982 Long term (current) use of aspirin: Secondary | ICD-10-CM | POA: Insufficient documentation

## 2011-06-15 DIAGNOSIS — S39012A Strain of muscle, fascia and tendon of lower back, initial encounter: Secondary | ICD-10-CM

## 2011-06-15 DIAGNOSIS — M545 Low back pain, unspecified: Secondary | ICD-10-CM | POA: Insufficient documentation

## 2011-06-15 DIAGNOSIS — F0390 Unspecified dementia without behavioral disturbance: Secondary | ICD-10-CM | POA: Insufficient documentation

## 2011-06-15 DIAGNOSIS — M5136 Other intervertebral disc degeneration, lumbar region: Secondary | ICD-10-CM

## 2011-06-15 DIAGNOSIS — Z87891 Personal history of nicotine dependence: Secondary | ICD-10-CM | POA: Insufficient documentation

## 2011-06-15 DIAGNOSIS — W050XXA Fall from non-moving wheelchair, initial encounter: Secondary | ICD-10-CM | POA: Insufficient documentation

## 2011-06-15 DIAGNOSIS — M51369 Other intervertebral disc degeneration, lumbar region without mention of lumbar back pain or lower extremity pain: Secondary | ICD-10-CM

## 2011-06-15 DIAGNOSIS — I10 Essential (primary) hypertension: Secondary | ICD-10-CM | POA: Insufficient documentation

## 2011-06-15 LAB — CBC
MCH: 26.6 pg (ref 26.0–34.0)
Platelets: 165 10*3/uL (ref 150–400)
RBC: 4.88 MIL/uL (ref 4.22–5.81)
RDW: 15 % (ref 11.5–15.5)
WBC: 5.7 10*3/uL (ref 4.0–10.5)

## 2011-06-15 LAB — COMPREHENSIVE METABOLIC PANEL
ALT: 24 U/L (ref 0–53)
AST: 34 U/L (ref 0–37)
Albumin: 4.4 g/dL (ref 3.5–5.2)
CO2: 27 mEq/L (ref 19–32)
Calcium: 9.5 mg/dL (ref 8.4–10.5)
Chloride: 101 mEq/L (ref 96–112)
GFR calc non Af Amer: >60 mL/min (ref >60)
Sodium: 139 mEq/L (ref 135–145)

## 2011-06-15 LAB — SURGICAL PCR SCREEN: Staphylococcus aureus: POSITIVE — AB

## 2011-06-15 MED ORDER — HYDROCODONE-ACETAMINOPHEN 5-325 MG PO TABS: 1.0000 | ORAL_TABLET | Freq: Four times a day (QID) | ORAL | Status: DC | PRN

## 2011-06-15 MED ORDER — HYDROCODONE-ACETAMINOPHEN 5-325 MG PO TABS
1.0000 | ORAL_TABLET | Freq: Once | ORAL | Status: AC
  Administered 2011-06-15: 1 via ORAL
  Filled 2011-06-15: qty 1

## 2011-06-15 NOTE — ED Notes (Signed)
Patient back to room from radiology. Pian 3\10 at this time. Denies any needs at this time. Call bell at bedside. Bed in low position and locked with side rails up. Will continue to monitor.

## 2011-06-15 NOTE — ED Notes (Signed)
Patient was sitting in wheelchair approximately 45 minutes. Was trying to get up to the bathroom and the wheelchair fell out from under him. Fell butt first onto linoleum floor on lower back.Complaints of middle lower back pain.

## 2011-06-15 NOTE — ED Notes (Signed)
Patient remains resting in bed on back with eyes closed and lights on. No facial grimacing. Equal chest rise and fall. Will continue to monitor. Bed in low position and locked with side rails up. Call bell within reach.

## 2011-06-15 NOTE — ED Provider Notes (Signed)
Scribed for Kathalene Frames, MD, the patient was seen in room APA05/APA05 . This chart was scribed by Glory Buff. This patient's care was started at 8:00 PM.   CSN: [id number] Arrival date & time: 06/15/2011  7:48 PM   Chief Complaint  Patient presents with  . Back Pain   (Include location/radiation/quality/duration/timing/severity/associated sxs/prior treatment) HPI [name] is a 65 y.o. male brought in by ambulance to the Emergency Department complaining of proximal lumbar back pain after a fall occuring ~45 minutes ago. Pt reports he attempted to get up from his wheel chair to go to the bathroom when his wheelchair rolled out from under him. Pt fell in a seated position onto a linoleum floor. Pt c/o proximal lumbar back pain that has been constant since onset and is described as severe. Denies LOC. Denies n/v, weakness or numbness. Denies other associated pain or injury.    Past Medical History  Diagnosis Date  . Hypertension   . Obesity   . Bell's palsy   . Bleeding ulcer   . SOB (shortness of breath) on exertion   . Hyperlipemia   . DEMENTIA     disoriented to time.does not know medical hx.information obtained from previous charts    Past Surgical History  Procedure Date  . Foot surgery    Family History  Problem Relation Age of Onset  . Anesthesia problems Neg Hx   . Hypotension Neg Hx   . Malignant hyperthermia Neg Hx   . Pseudochol deficiency Neg Hx    History  Substance Use Topics  . Smoking status: Former Smoker -- 1.0 packs/day for 30 years    Types: Cigarettes  . Smokeless tobacco: Not on file  . Alcohol Use: No    Review of Systems 10 Systems reviewed and are negative for acute change except as noted in the HPI.  Allergies  Review of patient's allergies indicates no known allergies.  Home Medications   Current Outpatient Rx  Name Route Sig Dispense Refill  . ACETAMINOPHEN 650 MG PO TBCR Oral Take 650 mg by mouth 2 (two) times daily.      . ASPIRIN  EC 325 MG PO TBEC Oral Take 325 mg by mouth 2 (two) times daily.      . ATENOLOL 50 MG PO TABS Oral Take 50 mg by mouth daily.      Marland Kitchen BENAZEPRIL HCL 10 MG PO TABS Oral Take 30 mg by mouth daily.      Marland Kitchen CETIRIZINE HCL 10 MG PO TABS Oral Take 10 mg by mouth daily.      . FUROSEMIDE 40 MG PO TABS Oral Take 40 mg by mouth 2 (two) times daily.      Marland Kitchen HYDROCORTISONE 2.5 % EX OINT Topical Apply 1 application topically 2 (two) times daily. Until healed     . NIACIN (ANTIHYPERLIPIDEMIC) 1000 MG PO TBCR Oral Take 1,000 mg by mouth at bedtime.      Marland Kitchen POTASSIUM CHLORIDE CRYS CR 20 MEQ PO TBCR Oral Take 40 mEq by mouth 2 (two) times daily.      Marland Kitchen RANITIDINE HCL 150 MG PO TABS Oral Take 150 mg by mouth daily.      Marland Kitchen SIMVASTATIN 20 MG PO TABS Oral Take 20 mg by mouth daily at 8 pm.      . VITAMIN C 500 MG PO TABS Oral Take 500 mg by mouth daily.        Physical Exam    BP 120/76  Pulse 86  Temp(Src) 98.6 F (37 C) (Oral)  Resp 20  Ht 6' (1.829 m)  Wt 260 lb (117.935 kg)  BMI 35.26 kg/m2  SpO2 93%  Physical Exam  Nursing note and vitals reviewed. Constitutional: He appears well-developed and well-nourished. No distress.  HENT:  Head: Normocephalic and atraumatic. Head is without raccoon's eyes and without Battle's sign.  Right Ear: External ear normal.  Left Ear: External ear normal.  Eyes: Conjunctivae and lids are normal. Right eye exhibits no discharge. Left eye exhibits no discharge. Right conjunctiva has no hemorrhage. Left conjunctiva has no hemorrhage. No scleral icterus.  Neck: Neck supple. No spinous process tenderness present. No tracheal deviation and no edema present.  Cardiovascular: Normal rate, regular rhythm and normal heart sounds.   Pulmonary/Chest: Effort normal and breath sounds normal. No stridor. No respiratory distress. He exhibits no tenderness, no crepitus and no deformity.  Abdominal: Soft. Normal appearance and bowel sounds are normal. He exhibits no distension and no  mass. There is no tenderness.  Musculoskeletal: He exhibits tenderness. He exhibits no edema.       Cervical back: He exhibits no tenderness, no swelling and no deformity.       Thoracic back: He exhibits no tenderness, no swelling and no deformity.       Lumbar back: He exhibits no tenderness and no swelling.       Proximal lumbar spine tenderness with no step off or deformity. No erythema or bruising.   Neurological: He is alert. He has normal strength. A cranial nerve deficit (no gross deficits) is present. No sensory deficit. He exhibits normal muscle tone. GCS eye subscore is 4. GCS verbal subscore is 5. GCS motor subscore is 6.       Able to move all extremities, sensation intact throughout  Skin: Skin is warm and dry. No rash noted. He is not diaphoretic.  Psychiatric: He has a normal mood and affect. His speech is normal and behavior is normal.   Procedures  OTHER DATA REVIEWED: Nursing notes, vital signs, and past medical records reviewed.  DIAGNOSTIC STUDIES: Oxygen Saturation is 93% on room air, adequate by my interpretation.    LABS / RADIOLOGY:  Dg Lumbar Spine Complete  06/15/2011  *RADIOLOGY REPORT*  Clinical Data: 65 year old male status post fall with pain.  LUMBAR SPINE - COMPLETE 4+ VIEW  Comparison: 05/29/2011 and earlier.  Findings: The normal lumbar segmentation.  Bulky endplate osteophytes throughout lumbar spine.  Multilevel severe disc space loss and chronic vacuum disc phenomena.  The facet hypertrophy.  No pars fracture.  Sacrum and SI joints within normal limits.  Stable vertebral height and alignment.  IMPRESSION: Diffusely advanced lumbar degenerative changes. No acute fracture or listhesis identified in the lumbar spine.  Original Report Authenticated By: Randall An, M.D.    ED COURSE / COORDINATION OF CARE: 20:00 EDP at PT bedside to remove Pt from c-spine immobilization and perform PE. Discussed with Pt treatment plan including pain management and xray of  his lumbar spine.   MDM: Patient slid out of her wheelchair and fell to the floor. There is no signs of fracture. Patient was given oral pain medications while in the emergency department. Patient states usually gets around using a wheelchair. At this point we'll discharge him home.  I will give a prescription for a few days of oral pain medications. Consistent with a lumbar strain.  SCRIBE ATTESTATION   I personally performed the services described in this documentation, which was  scribed in my presence.  The recorded information has been reviewed and considered.         Kathalene Frames, MD 06/15/11 [id number]

## 2011-06-15 NOTE — ED Notes (Signed)
MD at bedside. 

## 2011-06-15 NOTE — ED Notes (Signed)
Patient able to move extremities without difficulty. Has tenderness upon palpation of hips. No external rotation or shortening of either limb. Fully immobilized with c-collar intact. Pending MD evaluation. Bed in low position and locked with side rails up. Call bell at bedside. Patient oriented to person, place, and situation. Denies hitting head or losing consciousness.

## 2011-06-15 NOTE — ED Notes (Signed)
Into room with MD. MD evaluating patient at this time. C-collar removed by MD. Patient log rolled. Complaints of middle lower back pain only. No skin breakdown. Log rolled back onto back. Head of bed placed at 30 degree angle. Call bell at bedside. Bed in low position and locked with side rails up.

## 2011-06-15 NOTE — ED Notes (Signed)
Patient to radiology via stretcher

## 2011-06-15 NOTE — Patient Instructions (Addendum)
20 LEIB ASSELTA  06/15/2011   Your procedure is scheduled on:  06/17/2011  Report to [facility] at  700  AM.  Call this number if you have problems the morning of surgery: [phone]  Remember:   Do not eat food:After Midnight.  Do not drink clear liquids: After Midnight.  Take these medicines the morning of surgery with A SIP OF WATER: tenormin,lotensin,zantac   Do not wear jewelry, make-up or nail polish.  Do not wear lotions, powders, or perfumes. You may wear deodorant.  Do not shave 48 hours prior to surgery.  Do not bring valuables to the hospital.  Contacts, dentures or bridgework may not be worn into surgery.  Leave suitcase in the car. After surgery it may be brought to your room.  For patients admitted to the hospital, checkout time is 11:00 AM the day of discharge.   Patients discharged the day of surgery will not be allowed to drive home.  Name and phone number of your driver: family  Special Instructions: CHG Shower Use Special Wash: 1/2 bottle night before surgery and 1/2 bottle morning of surgery.   Please read over the following fact sheets that you were given: Pain Booklet, MRSA Information, Surgical Site Infection Prevention, Anesthesia Post-op Instructions and Care and Recovery After Surgery PATIENT INSTRUCTIONS POST-ANESTHESIA  IMMEDIATELY FOLLOWING SURGERY:  Do not drive or operate machinery for the first twenty four hours after surgery.  Do not make any important decisions for twenty four hours after surgery or while taking narcotic pain medications or sedatives.  If you develop intractable nausea and vomiting or a severe headache please notify your doctor immediately.  FOLLOW-UP:  Please make an appointment with your surgeon as instructed. You do not need to follow up with anesthesia unless specifically instructed to do so.  WOUND CARE INSTRUCTIONS (if applicable):  Keep a dry clean dressing on the anesthesia/puncture wound site if there is drainage.  Once the wound  has quit draining you may leave it open to air.  Generally you should leave the bandage intact for twenty four hours unless there is drainage.  If the epidural site drains for more than 36-48 hours please call the anesthesia department.  QUESTIONS?:  Please feel free to call your physician or the hospital operator if you have any questions, and they will be happy to assist you.     Seymour [place] [phone]

## 2011-06-15 NOTE — ED Notes (Signed)
Report called to Bosie Clos, RN at [facility]. States ambulance will have to bring patient back.

## 2011-06-15 NOTE — ED Notes (Signed)
EMS en route to ED to transfer patient back to [facility]. Patient resting comfortably in bed on back. Denies any pain at this time. Bed in low position and locked with side rails up. Call bell within reach.

## 2011-06-15 NOTE — ED Notes (Signed)
Patient resting in bed on back with eyes closed. No facial grimaces. Equal chest rise and fall. No distress at this time. Call bell at bedside. Bed in low position and locked with side rails up. Will continue to monitor.

## 2011-06-15 NOTE — ED Notes (Signed)
[facility] in ED to transfer patient to [facility].

## 2011-06-16 NOTE — H&P (Signed)
[name] is an 65 y.o. male.   Chief Complaint: Right knee pain HPI: He is a 65 year old male, resident of [facility], with right knee pain for several months.  He denies any trauma.  He has had swelling, giving way and popping of the right knee.  MRI done on 06/02/11 shows a tear of the medial meniscus with displacement.  He has not improved with rest and anti-inflammatories.  I have gone over the risks and imponderables with him and he appears to understand.    The rest home wants him admitted to a skilled unit post surgery for rehabillation.  This is being arranged.  Past Medical History  Diagnosis Date  . Hypertension   . Obesity   . Bell's palsy   . Bleeding ulcer   . SOB (shortness of breath) on exertion   . Hyperlipemia   . DEMENTIA     disoriented to time.does not know medical hx.information obtained from previous charts    Past Surgical History  Procedure Date  . Foot surgery     Family History  Problem Relation Age of Onset  . Anesthesia problems Neg Hx   . Hypotension Neg Hx   . Malignant hyperthermia Neg Hx   . Pseudochol deficiency Neg Hx    Social History:  reports that he has quit smoking. His smoking use included Cigarettes. He has a 30 pack-year smoking history. He does not have any smokeless tobacco history on file. He reports that he does not drink alcohol or use illicit drugs.  Allergies: No Known Allergies  No current facility-administered medications on file as of .   Medications Prior to Admission  Medication Sig Dispense Refill  . acetaminophen (QC ARTHRITIS PAIN RELIEF) 650 MG CR tablet Take 650 mg by mouth 2 (two) times daily.        Marland Kitchen aspirin EC 325 MG tablet Take 325 mg by mouth 2 (two) times daily.        Marland Kitchen atenolol (TENORMIN) 50 MG tablet Take 50 mg by mouth daily.        . benazepril (LOTENSIN) 10 MG tablet Take 30 mg by mouth daily.        . cetirizine (ZYRTEC) 10 MG tablet Take 10 mg by mouth daily.        . furosemide (LASIX) 40  MG tablet Take 40 mg by mouth 2 (two) times daily.        . niacin (NIASPAN) 1000 MG CR tablet Take 1,000 mg by mouth at bedtime.        . potassium chloride SA (K-DUR,KLOR-CON) 20 MEQ tablet Take 40 mEq by mouth 2 (two) times daily.        . ranitidine (ZANTAC) 150 MG tablet Take 150 mg by mouth daily.        . simvastatin (ZOCOR) 20 MG tablet Take 20 mg by mouth daily at 8 pm.        . vitamin C (ASCORBIC ACID) 500 MG tablet Take 500 mg by mouth daily.          Results for orders placed during the hospital encounter of 06/15/11 (from the past 48 hour(s))  SURGICAL PCR SCREEN     Status: Abnormal   Collection Time   06/15/11  1:20 PM      Component Value Range Comment   MRSA, PCR POSITIVE (*) NEGATIVE     Staphylococcus aureus POSITIVE (*) NEGATIVE    CBC  Status: Normal   Collection Time   06/15/11  2:00 PM      Component Value Range Comment   WBC 5.7  4.0 - 10.5 (K/uL)    RBC 4.88  4.22 - 5.81 (MIL/uL)    Hemoglobin 13.0  13.0 - 17.0 (g/dL)    HCT 41.5  39.0 - 52.0 (%)    MCV 85.0  78.0 - 100.0 (fL)    MCH 26.6  26.0 - 34.0 (pg)    MCHC 31.3  30.0 - 36.0 (g/dL)    RDW 15.0  11.5 - 15.5 (%)    Platelets 165  150 - 400 (K/uL)   COMPREHENSIVE METABOLIC PANEL     Status: Abnormal   Collection Time   06/15/11  2:00 PM      Component Value Range Comment   Sodium 139  135 - 145 (mEq/L)    Potassium 4.3  3.5 - 5.1 (mEq/L)    Chloride 101  96 - 112 (mEq/L)    CO2 27  19 - 32 (mEq/L)    Glucose, Bld 112 (*) 70 - 99 (mg/dL)    BUN 23  6 - 23 (mg/dL)    Creatinine, Ser 0.97  0.50 - 1.35 (mg/dL)    Calcium 9.5  8.4 - 10.5 (mg/dL)    Total Protein 7.6  6.0 - 8.3 (g/dL)    Albumin 4.4  3.5 - 5.2 (g/dL)    AST 34  0 - 37 (U/L)    ALT 24  0 - 53 (U/L)    Alkaline Phosphatase 133 (*) 39 - 117 (U/L)    Total Bilirubin 0.5  0.3 - 1.2 (mg/dL)    GFR calc non Af Amer >60  >60 (mL/min)    GFR calc Af Amer >60  >60 (mL/min)    Dg Lumbar Spine Complete  06/15/2011  *RADIOLOGY REPORT*   Clinical Data: 66 year old male status post fall with pain.  LUMBAR SPINE - COMPLETE 4+ VIEW  Comparison: 05/29/2011 and earlier.  Findings: The normal lumbar segmentation.  Bulky endplate osteophytes throughout lumbar spine.  Multilevel severe disc space loss and chronic vacuum disc phenomena.  The facet hypertrophy.  No pars fracture.  Sacrum and SI joints within normal limits.  Stable vertebral height and alignment.  IMPRESSION: Diffusely advanced lumbar degenerative changes. No acute fracture or listhesis identified in the lumbar spine.  Original Report Authenticated By: Randall An, M.D.    Review of Systems  Constitutional: Negative.   HENT: Negative.   Eyes: Negative.   Respiratory: Negative.   Cardiovascular: Negative.   Gastrointestinal: Negative.   Genitourinary: Negative.   Musculoskeletal: Positive for joint pain (right knee pain and swelling.  History of prior right foot surgery and some tenderness there.).  Skin: Negative.   Neurological: Negative.   Psychiatric/Behavioral: Positive for memory loss (He has a learning disability).    There were no vitals taken for this visit. Physical Exam  Nursing note and vitals reviewed. Constitutional: He is oriented to person, place, and time. He appears well-developed and well-nourished.  HENT:  Head: Normocephalic.  Eyes: Conjunctivae and EOM are normal. Pupils are equal, round, and reactive to light.  Neck: Normal range of motion. Neck supple.  Cardiovascular: Normal rate, regular rhythm, normal heart sounds and intact distal pulses.   Respiratory: Effort normal and breath sounds normal.  GI: Soft. Bowel sounds are normal.  Musculoskeletal: He exhibits tenderness (of the right knee and ROM of 0 to 95 with crepitus and pain medially.).  Right knee: He exhibits decreased range of motion, swelling and effusion. tenderness found. Medial joint line tenderness noted.       Legs: Neurological: He is alert and oriented to person,  place, and time. He has normal reflexes.  Skin: Skin is warm and dry.  Psychiatric: He has a normal mood and affect. His behavior is normal. Judgment and thought content normal.       He has a learning disability.     Assessment/Plan Arthroscopy of the left knee is planned as an outpatient.  He will be transferred to a skilled nursing unit post-op.    Shaheim Mahar 06/16/2011, 10:08 AM

## 2011-06-17 ENCOUNTER — Inpatient Hospital Stay
Admission: RE | Admit: 2011-06-17 | Discharge: 2011-07-15 | Disposition: A | Discharge status: Other Acute Inpatient Hospital | Payer: Medicaid Other | Source: Ambulatory Visit | Attending: Internal Medicine | Admitting: Internal Medicine

## 2011-06-17 ENCOUNTER — Encounter (HOSPITAL_COMMUNITY): Payer: Self-pay | Admitting: *Deleted

## 2011-06-17 ENCOUNTER — Encounter (HOSPITAL_COMMUNITY)
Admission: RE | Disposition: A | Discharge status: Skilled Nursing Facility | Payer: Self-pay | Source: Ambulatory Visit | Attending: Orthopaedic Surgery

## 2011-06-17 ENCOUNTER — Encounter (HOSPITAL_COMMUNITY): Payer: Self-pay | Admitting: Anesthesiology

## 2011-06-17 ENCOUNTER — Ambulatory Visit (HOSPITAL_COMMUNITY): Payer: Medicare Other | Admitting: Anesthesiology

## 2011-06-17 ENCOUNTER — Other Ambulatory Visit: Payer: Self-pay | Admitting: Orthopaedic Surgery

## 2011-06-17 ENCOUNTER — Ambulatory Visit (HOSPITAL_COMMUNITY)
Admission: RE | Admit: 2011-06-17 | Discharge: 2011-06-17 | Disposition: A | Discharge status: Skilled Nursing Facility | Payer: Medicare Other | Source: Ambulatory Visit | Attending: Orthopaedic Surgery | Admitting: Orthopaedic Surgery

## 2011-06-17 DIAGNOSIS — I10 Essential (primary) hypertension: Secondary | ICD-10-CM | POA: Insufficient documentation

## 2011-06-17 DIAGNOSIS — M23305 Other meniscus derangements, unspecified medial meniscus, unspecified knee: Secondary | ICD-10-CM | POA: Insufficient documentation

## 2011-06-17 DIAGNOSIS — S83206A Unspecified tear of unspecified meniscus, current injury, right knee, initial encounter: Secondary | ICD-10-CM

## 2011-06-17 DIAGNOSIS — Z01812 Encounter for preprocedural laboratory examination: Secondary | ICD-10-CM | POA: Insufficient documentation

## 2011-06-17 HISTORY — PX: KNEE ARTHROSCOPY: SHX127

## 2011-06-17 SURGERY — ARTHROSCOPY, KNEE
Anesthesia: Choice | Site: Knee | Laterality: Right | Wound class: Clean

## 2011-06-17 MED ORDER — PROPOFOL 10 MG/ML IV EMUL
INTRAVENOUS | Status: DC | PRN
  Administered 2011-06-17: 135 mg via INTRAVENOUS

## 2011-06-17 MED ORDER — LACTATED RINGERS IR SOLN
Status: DC | PRN
  Administered 2011-06-17: 3000 mL

## 2011-06-17 MED ORDER — LACTATED RINGERS IV SOLN
INTRAVENOUS | Status: DC
  Administered 2011-06-17: 1000 mL via INTRAVENOUS

## 2011-06-17 MED ORDER — FENTANYL CITRATE 0.05 MG/ML IJ SOLN: 25.0000 ug | INTRAMUSCULAR | Status: DC | PRN

## 2011-06-17 MED ORDER — FENTANYL CITRATE 0.05 MG/ML IJ SOLN
INTRAMUSCULAR | Status: AC
  Filled 2011-06-17: qty 2

## 2011-06-17 MED ORDER — TRAMADOL HCL 50 MG PO TABS: 50.0000 mg | ORAL_TABLET | Freq: Four times a day (QID) | ORAL | Status: AC | PRN

## 2011-06-17 MED ORDER — SODIUM CHLORIDE 0.9 % IR SOLN
Status: DC | PRN
  Administered 2011-06-17: 1

## 2011-06-17 MED ORDER — MIDAZOLAM HCL 2 MG/2ML IJ SOLN
INTRAMUSCULAR | Status: AC
  Administered 2011-06-17: 2 mg via INTRAVENOUS
  Filled 2011-06-17: qty 2

## 2011-06-17 MED ORDER — LIDOCAINE HCL (PF) 1 % IJ SOLN
INTRAMUSCULAR | Status: AC
  Filled 2011-06-17: qty 5

## 2011-06-17 MED ORDER — PROPOFOL 10 MG/ML IV EMUL
INTRAVENOUS | Status: AC
  Filled 2011-06-17: qty 20

## 2011-06-17 MED ORDER — MIDAZOLAM HCL 2 MG/2ML IJ SOLN
1.0000 mg | INTRAMUSCULAR | Status: DC | PRN
  Administered 2011-06-17: 2 mg via INTRAVENOUS

## 2011-06-17 MED ORDER — EPHEDRINE SULFATE 50 MG/ML IJ SOLN
INTRAMUSCULAR | Status: DC | PRN
  Administered 2011-06-17: 10 mg via INTRAVENOUS

## 2011-06-17 MED ORDER — FENTANYL CITRATE 0.05 MG/ML IJ SOLN
INTRAMUSCULAR | Status: DC | PRN
  Administered 2011-06-17 (×2): 50 ug via INTRAVENOUS

## 2011-06-17 MED ORDER — ONDANSETRON HCL 4 MG/2ML IJ SOLN: 4.0000 mg | Freq: Once | INTRAMUSCULAR | Status: DC | PRN

## 2011-06-17 MED ORDER — BUPIVACAINE HCL (PF) 0.25 % IJ SOLN
INTRAMUSCULAR | Status: AC
  Filled 2011-06-17: qty 30

## 2011-06-17 MED ORDER — BUPIVACAINE HCL (PF) 0.25 % IJ SOLN
INTRAMUSCULAR | Status: DC | PRN
  Administered 2011-06-17: 30 mL

## 2011-06-17 MED ORDER — ONDANSETRON HCL 4 MG/2ML IJ SOLN
INTRAMUSCULAR | Status: AC
  Filled 2011-06-17: qty 2

## 2011-06-17 SURGICAL SUPPLY — 61 items
BAG HAMPER (MISCELLANEOUS) ×3 IMPLANT
BANDAGE ELASTIC 6 VELCRO NS (GAUZE/BANDAGES/DRESSINGS) ×3 IMPLANT
BANDAGE ELASTIC 6 VELCRO ST LF (GAUZE/BANDAGES/DRESSINGS) ×3 IMPLANT
BANDAGE ESMARK 4X12 BL STRL LF (DISPOSABLE) ×2 IMPLANT
BLADE SURG 15 STRL LF DISP TIS (BLADE) ×2 IMPLANT
BLADE SURG 15 STRL SS (BLADE) ×1
BLADE SURG SZ11 CARB STEEL (BLADE) ×3 IMPLANT
BNDG ESMARK 4X12 BLUE STRL LF (DISPOSABLE) ×3
CLOTH BEACON ORANGE TIMEOUT ST (SAFETY) ×3 IMPLANT
CUFF TOURNIQUET SINGLE 34IN LL (TOURNIQUET CUFF) ×3 IMPLANT
CUFF TOURNIQUET SINGLE 44IN (TOURNIQUET CUFF) IMPLANT
CUTTER TOMCAT 4.0M (BURR) ×3 IMPLANT
DECANTER SPIKE VIAL GLASS SM (MISCELLANEOUS) ×3 IMPLANT
DRAPE PROXIMA HALF (DRAPES) ×3 IMPLANT
DRSG XEROFORM 1X8 (GAUZE/BANDAGES/DRESSINGS) ×3 IMPLANT
ELECT MENISCUS 165MM 90D (ELECTRODE) IMPLANT
FLOOR PAD 36X40 (MISCELLANEOUS)
FORMALIN 10 PREFIL 480ML (MISCELLANEOUS) ×3 IMPLANT
GAUZE SPONGE 4X4 12PLY STRL LF (GAUZE/BANDAGES/DRESSINGS) ×3 IMPLANT
GAUZE SPONGE 4X4 16PLY XRAY LF (GAUZE/BANDAGES/DRESSINGS) ×3 IMPLANT
GLOVE BIO SURGEON STRL SZ8 (GLOVE) ×3 IMPLANT
GLOVE BIO SURGEON STRL SZ8.5 (GLOVE) ×3 IMPLANT
GLOVE BIOGEL PI IND STRL 7.0 (GLOVE) ×2 IMPLANT
GLOVE BIOGEL PI INDICATOR 7.0 (GLOVE) ×1
GLOVE ECLIPSE 6.5 STRL STRAW (GLOVE) ×3 IMPLANT
GOWN BRE IMP SLV AUR XL STRL (GOWN DISPOSABLE) ×3 IMPLANT
GOWN STRL REIN XL XLG (GOWN DISPOSABLE) ×3 IMPLANT
HANDPIECE (MISCELLANEOUS) IMPLANT
HLDR LEG FOAM (MISCELLANEOUS) ×2 IMPLANT
IV LACTATED RINGER IRRG 3000ML (IV SOLUTION) ×3
IV LR IRRIG 3000ML ARTHROMATIC (IV SOLUTION) ×6 IMPLANT
KIT BLADEGUARD II DBL (SET/KITS/TRAYS/PACK) ×3 IMPLANT
KIT ROOM TURNOVER AP CYSTO (KITS) ×3 IMPLANT
KNIFE HOOK (BLADE) IMPLANT
KNIFE MENISECTOMY (BLADE) IMPLANT
LEG HOLDER FOAM (MISCELLANEOUS) ×1
MANIFOLD NEPTUNE II (INSTRUMENTS) ×3 IMPLANT
MANIFOLD NEPTUNE WASTE (CANNULA) ×3 IMPLANT
MARKER SKIN DUAL TIP RULER LAB (MISCELLANEOUS) ×3 IMPLANT
NEEDLE HYPO 21X1.5 SAFETY (NEEDLE) ×3 IMPLANT
NEEDLE SPNL 18GX3.5 QUINCKE PK (NEEDLE) ×3 IMPLANT
NS IRRIG 1000ML POUR BTL (IV SOLUTION) ×3 IMPLANT
PACK ARTHRO LIMB DRAPE STRL (MISCELLANEOUS) ×3 IMPLANT
PAD ABD 5X9 TENDERSORB (GAUZE/BANDAGES/DRESSINGS) ×3 IMPLANT
PAD ARMBOARD 7.5X6 YLW CONV (MISCELLANEOUS) ×3 IMPLANT
PAD FLOOR 36X40 (MISCELLANEOUS) IMPLANT
PADDING CAST COTTON 6X4 STRL (CAST SUPPLIES) ×3 IMPLANT
PADDING WEBRIL 6 STERILE (GAUZE/BANDAGES/DRESSINGS) ×3 IMPLANT
SET ARTHROSCOPY INST (INSTRUMENTS) ×3 IMPLANT
SET BASIN LINEN APH (SET/KITS/TRAYS/PACK) ×3 IMPLANT
SOL PREP PROV IODINE SCRUB 4OZ (MISCELLANEOUS) ×3 IMPLANT
SPONGE GAUZE 4X4 12PLY (GAUZE/BANDAGES/DRESSINGS) ×3 IMPLANT
SUT ETHILON 3 0 FSL (SUTURE) ×3 IMPLANT
SYR 30ML LL (SYRINGE) ×3 IMPLANT
TIP 0 DEGREE (MISCELLANEOUS) IMPLANT
TIP 30 DEGREE (MISCELLANEOUS) IMPLANT
TIP 70 DEGREE (MISCELLANEOUS) IMPLANT
TOWEL OR 17X26 4PK STRL BLUE (TOWEL DISPOSABLE) ×3 IMPLANT
TUBING FLOSTEADY ARTHRO PUMP (IRRIGATION / IRRIGATOR) ×3 IMPLANT
WATER STERILE IRR 1000ML POUR (IV SOLUTION) ×3 IMPLANT
YANKAUER SUCT BULB TIP 10FT TU (MISCELLANEOUS) ×6 IMPLANT

## 2011-06-17 NOTE — Progress Notes (Signed)
The History and Physical is unchanged. The patient is medically able to have surgery on the right knee.

## 2011-06-17 NOTE — Transfer of Care (Signed)
Immediate Anesthesia Transfer of Care Note  Patient: [name]  Procedure(s) Performed:  ARTHROSCOPY KNEE  Patient Location: PACU  Anesthesia Type: General  Level of Consciousness: awake and alert   Airway & Oxygen Therapy: Patient Spontanous Breathing and Patient connected to nasal cannula oxygen  Post-op Assessment: Report given to PACU RN, Post -op Vital signs reviewed and stable and Patient moving all extremities  Post vital signs: Reviewed and stable  Complications: No apparent anesthesia complications

## 2011-06-17 NOTE — Anesthesia Postprocedure Evaluation (Signed)
  Anesthesia Post-op Note  Patient: [name]  Procedure(s) Performed:  ARTHROSCOPY KNEE  Patient Location: PACU  Anesthesia Type: General  Level of Consciousness: awake and alert , As Before  Airway and Oxygen Therapy: Patient Spontanous Breathing and Patient connected to nasal cannula oxygen  Post-op Pain: none  Post-op Assessment: Post-op Vital signs reviewed, Patient's Cardiovascular Status Stable and Respiratory Function Stable  Post-op Vital Signs: Reviewed  Complications: No apparent anesthesia complications

## 2011-06-17 NOTE — Op Note (Signed)
[name], [name]                   ACCOUNT NO.:  [id number]  MEDICAL RECORD NO.:  [id number]  LOCATION:  APPO                          FACILITY:  [facility]  PHYSICIAN:  J. Sanjuana [name], M.D. DATE OF BIRTH:  1946/09/22  DATE OF PROCEDURE: DATE OF DISCHARGE:                              OPERATIVE REPORT   PREOPERATIVE DIAGNOSIS:  Tear medial meniscus of the right knee.  POSTOPERATIVE DIAGNOSIS:  Tear medial meniscus of the right knee.  PROCEDURE:  Operative arthroscopy, partial medial meniscectomy.  ANESTHESIA:  General.  TOURNIQUET TIME:  19 minutes.  No drains.  SURGEON:  J. Sanjuana Kava, MD  INDICATIONS:  The patient is a 65 year old male resident of [facility] with pain and tenderness in his right knee with giving way, swelling, popping.  MRI shows tear of the medial meniscus of the knee. Risks and imponderables were discussed with the patient preoperatively prior to surgery and he understands as an outpatient procedure.  The rest home, I would like to have him transferred to a skilled nursing and they have arranged after surgery days he is to be transferred to [facility] for rehabilitation in a skilled unit.  The patient is agreeable to this.  DESCRIPTION OF PROCEDURE:  The patient was seen in the holding area. The right knee was identified as correct surgical site.  I have placed a mark on the right knee.  She was brought to the operating room, given general anesthesia while supine.  Tourniquet and leg holder were placed, deflated right upper thigh.  The patient was prepped and draped in usual manner.  Generalized time-out identified the patient as [name] doing the right knee.  All instrumentation was deemed to be properly working and the operating room team knew each other.  Right lower extremity was elevated, prepped and draped in usual manner. Leg was elevated and wrapped circumferentially with Esmarch bandage. Tourniquet inflated to 300  mmHg.  Inflow cannula was inserted medially and the lactated Ringer's instilled into the knee by an infusion pump. Arthroscope was inserted laterally, knee systematically examined and suprapatellar pouch looked normal and the surface of patella with grade 2 changes medially, there were grade 2 to grade 3 changes of the distal femur proximal to the articular surfaces and there was a tear in the posterior horn of the medial meniscus.  There were no loose fragments. Anterior cruciate was intact laterally we could see well.  He had some slight degeneration around the meniscus, but no discrete tear.  There were no loose bodies present.  Attention was directed back to medial side.  Using meniscal punch, meniscal probe and meniscal shaver, good smooth contour was obtained.  Knee was systematically reexamined and no new pathology found.  Wound was irrigated with remaining part of lactated Ringer's.  Wound was reapproximated with 3-0 nylon interrupted vertical mattress manner.  Marcaine 0.25% of 30 mL was instilled into each of the portals.  The patient tolerated the procedure well.  Total tourniquet time was 19 minutes.  Go to recovery and then will be transferred as stated earlier to the [facility].  I will see him in the office in  2 weeks.  Physical therapy is arranged.          ______________________________ [name], M.D.     JWK/MEDQ  D:  06/17/2011  T:  06/17/2011  Job:  [id number]

## 2011-06-17 NOTE — Anesthesia Procedure Notes (Signed)
Procedure Name: LMA Insertion Date/Time: 06/17/2011 8:53 AM Performed by: Conni Slipper Pre-anesthesia Checklist: Patient identified and Timeout performed Patient Re-evaluated:Patient Re-evaluated prior to inductionOxygen Delivery Method: Circle System Utilized Preoxygenation: Pre-oxygenation with 100% oxygen Intubation Type: IV induction Ventilation: Mask ventilation without difficulty LMA: LMA inserted LMA Size: 5.0 Tube type: Oral Tube secured with: Tape Dental Injury: Teeth and Oropharynx as per pre-operative assessment

## 2011-06-17 NOTE — Brief Op Note (Signed)
06/17/2011  9:28 AM  PATIENT:  [name]  65 y.o. male  PRE-OPERATIVE DIAGNOSIS:  Tear Right knee Medial meniscus  POST-OPERATIVE DIAGNOSIS:  Right knee partial Medial meniscal tear  PROCEDURE:  Procedure(s): ARTHROSCOPY KNEE  SURGEON:  Surgeon(s): Sanjuana Kava  PHYSICIAN ASSISTANT:   ASSISTANTS: none   ANESTHESIA:   general  OR FLUID I/O:  Total I/O In: 400 [I.V.:400] Out: -   BLOOD ADMINISTERED:none  DRAINS: none   LOCAL MEDICATIONS USED:  BUPIVICAINE  30 cc  SPECIMEN:  Source of Specimen:  Medial meniscus of the right knee  DISPOSITION OF SPECIMEN:  PATHOLOGY  COUNTS:  YES  TOURNIQUET:   Total Tourniquet Time Documented: Thigh (Right) - 19 minutes  DICTATION: .Other Dictation: Dictation Number ?  PLAN OF CARE: Discharge to [facility]:  PACU - hemodynamically stable.   Delay start of Pharmacological VTE agent (>24hrs) due to surgical blood loss or risk of bleeding:  not applicable

## 2011-06-17 NOTE — Anesthesia Preprocedure Evaluation (Addendum)
Anesthesia Evaluation  Name, MR# and DOB Patient awake  General Assessment Comment  Reviewed: Allergy & Precautions, H&P , NPO status , Patient's Chart, lab work & pertinent test results, reviewed documented beta blocker date and time   History of Anesthesia Complications Negative for: history of anesthetic complications  Airway Mallampati: I  Neck ROM: Full    Dental  (+) Edentulous Upper and Edentulous Lower   Pulmonary (+) shortness of breath and With exertion     pulmonary exam normalPulmonary Exam Normal     Cardiovascular hypertension, Pt. on medications Regular Normal    Neuro/Psych    (+) PSYCHIATRIC DISORDERS,    GI/Hepatic/Renal       PUD,       Endo/Other    Abdominal (+) obese,   Musculoskeletal   Hematology   Peds  Reproductive/Obstetrics    Anesthesia Other Findings             Anesthesia Physical Anesthesia Plan  ASA: III  Anesthesia Plan: General   Post-op Pain Management:    Induction: Intravenous  Airway Management Planned: LMA  Additional Equipment:   Intra-op Plan:   Post-operative Plan: Extubation in OR  Informed Consent: I have reviewed the patients History and Physical, chart, labs and discussed the procedure including the risks, benefits and alternatives for the proposed anesthesia with the patient or authorized representative who has indicated his/her understanding and acceptance.     Plan Discussed with:   Anesthesia Plan Comments:         Anesthesia Quick Evaluation

## 2011-06-22 NOTE — H&P (Unsigned)
[name], [name]                   ACCOUNT NO.:  [id number]  MEDICAL RECORD NO.:  [id number]  LOCATION:  S117                          FACILITY:  [facility]  PHYSICIAN:  Hazelle Woollard D. Legrand Rams, MD   DATE OF BIRTH:  October 11, 1945  DATE OF ADMISSION:  06/17/2011 DATE OF DISCHARGE:  LH                             HISTORY & PHYSICAL   REASON FOR NURSING HOME ADMISSION:  For physical therapy post knee surgery.  HISTORY OF PRESENT ILLNESS:  [name] with history of multiple medical illnesses who was a resident of [facility], was admitted to [facility] after he had right knee operative arthroscopy.  He had a partial meniscus removal.  The patient was then admitted to receive physical therapy.  The patient is currently participating with physical therapy and he is feeling better.  REVIEW OF SYSTEMS:  No fever, chills, cough, chest pain, nausea, vomiting, abdominal pain, dysuria, urgency or frequency of urination.  PAST MEDICAL HISTORY: 1. Hypertension. 2. Hyperlipidemia. 3. Obesity. 4. Mental retardation. 5. Bleeding ulcers.  CURRENT MEDICATIONS: 1. Aspirin 325 mg p.o. daily. 2. Zyrtec 10 mg p.o. daily. 3. Vitamin C 500 mg p.o. daily. 4. Simvastatin 20 mg daily. 5. Ranitidine 150 mg daily. 6. Niaspan 100 mg nightly. 7. Lasix 40 mg b.i.d. 8. Benazepril 10 mg 3 tablets p.o. daily. 9. Atenolol 50 mg daily. 10.KCl 20 mEq 2 tablets p.o. b.i.d.  SOCIAL HISTORY:  The patient was a resident of local assisted living. No history of alcohol, tobacco or substance abuse.  FAMILY HISTORY:  Not available at this time.  PHYSICAL EXAMINATION:  GENERAL:  The patient is alert, awake and obese. VITAL SIGNS:  Blood pressure 138/68, pulse 78, respiratory rate 16, temperature 98 degrees Fahrenheit. HEENT:  Pupils are equal and reactive. NECK:  Supple. CHEST:  Clear lung fields.  Good air entry. CARDIOVASCULAR SYSTEM:  First and second heart sound heard.  No murmur, no  gallop.  ABDOMEN:  Obese, soft and lax.  Bowel sound is positive.  No mass or organomegaly. EXTREMITIES:  No leg edema.  Incision places on his right knee is clean and stitches are in place.  ASSESSMENT: 1. Status post right open arthroscopic surgery. 2. Deconditioning. 3. Obesity. 4. Hypertension. 5. Hyperlipidemia. 6. History of mental retardation.  PLAN:  We will continue the patient on physical therapy and occupational therapy.  Continue to assist in his daily living activity.  Continue current treatment and supportive care.     Donnah Levert D. Legrand Rams, MD     TDF/MEDQ  D:  06/22/2011  T:  06/22/2011  Job:  [id number]

## 2011-06-23 ENCOUNTER — Encounter (HOSPITAL_COMMUNITY): Payer: Self-pay | Admitting: Orthopaedic Surgery

## 2011-07-12 LAB — BASIC METABOLIC PANEL
BUN: 14
Chloride: 104
Glucose, Bld: 109 — ABNORMAL HIGH
Potassium: 3.9

## 2011-08-08 ENCOUNTER — Encounter (HOSPITAL_COMMUNITY): Payer: Self-pay | Admitting: Emergency Medicine

## 2011-08-08 ENCOUNTER — Emergency Department (HOSPITAL_COMMUNITY)
Admission: EM | Admit: 2011-08-08 | Discharge: 2011-08-08 | Disposition: A | Discharge status: Other Acute Inpatient Hospital | Payer: Medicare Other | Attending: Emergency Medicine | Admitting: Emergency Medicine

## 2011-08-08 ENCOUNTER — Emergency Department (HOSPITAL_COMMUNITY): Payer: Medicare Other

## 2011-08-08 DIAGNOSIS — F0390 Unspecified dementia without behavioral disturbance: Secondary | ICD-10-CM | POA: Insufficient documentation

## 2011-08-08 DIAGNOSIS — E785 Hyperlipidemia, unspecified: Secondary | ICD-10-CM | POA: Insufficient documentation

## 2011-08-08 DIAGNOSIS — M25522 Pain in left elbow: Secondary | ICD-10-CM

## 2011-08-08 DIAGNOSIS — M25562 Pain in left knee: Secondary | ICD-10-CM

## 2011-08-08 DIAGNOSIS — Y9210 Unspecified residential institution as the place of occurrence of the external cause: Secondary | ICD-10-CM | POA: Insufficient documentation

## 2011-08-08 DIAGNOSIS — W19XXXA Unspecified fall, initial encounter: Secondary | ICD-10-CM

## 2011-08-08 DIAGNOSIS — M25569 Pain in unspecified knee: Secondary | ICD-10-CM | POA: Insufficient documentation

## 2011-08-08 DIAGNOSIS — I10 Essential (primary) hypertension: Secondary | ICD-10-CM | POA: Insufficient documentation

## 2011-08-08 DIAGNOSIS — M25559 Pain in unspecified hip: Secondary | ICD-10-CM | POA: Insufficient documentation

## 2011-08-08 DIAGNOSIS — W06XXXA Fall from bed, initial encounter: Secondary | ICD-10-CM | POA: Insufficient documentation

## 2011-08-08 DIAGNOSIS — M25529 Pain in unspecified elbow: Secondary | ICD-10-CM | POA: Insufficient documentation

## 2011-08-08 DIAGNOSIS — M25552 Pain in left hip: Secondary | ICD-10-CM

## 2011-08-08 NOTE — ED Notes (Signed)
Pt arrived to ed saturated in urine. Pt did have a incontinence brief on. Pts back and legs had urine on them. Pt was cleaned and clothing removed on arrival.

## 2011-08-08 NOTE — ED Notes (Signed)
[facility] was called to come and transport pt back to [facility].

## 2011-08-08 NOTE — ED Provider Notes (Signed)
Scribed for [name], MD, the patient was seen in room APA14/APA14. This chart was scribed by [employer]. The patient's care started at 07:59  CSN: [id number] Arrival date & time: 08/08/2011  7:56 AM   First MD Initiated Contact with Patient 08/08/11 0759      Chief Complaint  Patient presents with  . Fall   HPI  A level 5 caveat pertains due to dementia [name] is a 65 y.o. male who presents to the Emergency Department complaining of Fall with associated left arm and left leg pain. Patient denies any loss of consciousness associated with fall. Patient states that he fell this morning as he tried getting out of bed. Patient localizes pain to his left elbow, left hip and left knee. Denies any neck pain, trauma to his head, recent nausea or vomiting.  Past Medical History  Diagnosis Date  . Hypertension   . Obesity   . Bell's palsy   . Bleeding ulcer   . SOB (shortness of breath) on exertion   . Hyperlipemia   . DEMENTIA     disoriented to time.does not know medical hx.information obtained from previous charts    Past Surgical History  Procedure Date  . Foot surgery   . Knee arthroscopy 06/17/2011    Procedure: ARTHROSCOPY KNEE;  Surgeon: Sanjuana Kava;  Location: [facility];  Service: Orthopedics;  Laterality: Right;    Family History  Problem Relation Age of Onset  . Anesthesia problems Neg Hx   . Hypotension Neg Hx   . Malignant hyperthermia Neg Hx   . Pseudochol deficiency Neg Hx     History  Substance Use Topics  . Smoking status: Former Smoker -- 1.0 packs/day for 30 years    Types: Cigarettes  . Smokeless tobacco: Not on file  . Alcohol Use: No      Review of Systems  Unable to perform ROS: Dementia  Constitutional:       10 Systems reviewed and are negative for acute change except as noted in the HPI.    Allergies  Hydrocodone-acetaminophen  Home Medications   Current Outpatient Rx  Name Route Sig Dispense Refill  . ACETAMINOPHEN ER  650 MG PO TBCR Oral Take 650 mg by mouth 2 (two) times daily.      . ASPIRIN EC 325 MG PO TBEC Oral Take 325 mg by mouth 2 (two) times daily.      . ATENOLOL 50 MG PO TABS Oral Take 50 mg by mouth daily.      Marland Kitchen BENAZEPRIL HCL 10 MG PO TABS Oral Take 30 mg by mouth daily.      Marland Kitchen CETIRIZINE HCL 10 MG PO TABS Oral Take 10 mg by mouth daily.      . FUROSEMIDE 40 MG PO TABS Oral Take 40 mg by mouth 2 (two) times daily.      Marland Kitchen HYDROCORTISONE 2.5 % EX OINT Topical Apply 1 application topically 2 (two) times daily. Until healed     . NIACIN (ANTIHYPERLIPIDEMIC) 1000 MG PO TBCR Oral Take 1,000 mg by mouth at bedtime.      Marland Kitchen POTASSIUM CHLORIDE CRYS CR 20 MEQ PO TBCR Oral Take 40 mEq by mouth 2 (two) times daily.      Marland Kitchen RANITIDINE HCL 150 MG PO TABS Oral Take 150 mg by mouth daily.      Marland Kitchen SIMVASTATIN 20 MG PO TABS Oral Take 20 mg by mouth daily at 8 pm.      .  VITAMIN C 500 MG PO TABS Oral Take 500 mg by mouth daily.        There were no vitals taken for this visit.  Physical Exam  Nursing note and vitals reviewed. Constitutional: He appears well-developed and well-nourished.  HENT:  Head: Normocephalic and atraumatic.  Mouth/Throat: Oropharynx is clear and moist. No oropharyngeal exudate.  Eyes: Conjunctivae are normal. Right eye exhibits no discharge. Left eye exhibits no discharge.  Neck: Neck supple. No tracheal deviation present.  Cardiovascular: Normal rate, regular rhythm and normal heart sounds.   No murmur heard. Pulmonary/Chest: Effort normal and breath sounds normal. He has no wheezes.  Abdominal: Soft. There is no tenderness.  Musculoskeletal: He exhibits tenderness. He exhibits no edema.       Mild tenderness to palpation over left elbow tenderness to palpation over the left patella with minimal pain with range of Motion Pain to the lateral left hip. Mild pain with range of motion. Other extremities from without pain Extremities were distally neuro-vascularly intact No Cervical  spine or other spinal tenderness.  Skin: Skin is warm and dry. No rash noted. He is not diaphoretic.  Psychiatric:       Demented     ED Course  Procedures  DIAGNOSTIC STUDIES: Oxygen Saturation is 95% on room air, normal by my interpretation.    COORDINATION OF CARE: 08:00 - EDP examined patient and ordered the following Orders Placed This Encounter  Procedures  . DG Elbow Complete Left  . DG Hip Complete Left  . DG Knee Complete 4 Views Left     Dg Elbow Complete Left  08/08/2011  *RADIOLOGY REPORT*  Clinical Data: Post fall, now with elbow pain  LEFT ELBOW - COMPLETE 3+ VIEW  Comparison: None.  Findings: No fracture or dislocation.  No joint effusion.  Joint spaces are preserved.  Regional soft tissues are normal.  Ossified fragment adjacent to the lateral epicondyle possibly the sequela of remote avulsion injury.  IMPRESSION: No acute findings.  Original Report Authenticated By: Rachel Moulds, M.D.   Dg Hip Complete Left  08/08/2011  *RADIOLOGY REPORT*  Clinical Data: Fall, now with left hip pain  LEFT HIP - COMPLETE 2+ VIEW  Comparison: None.  Findings: No fracture or dislocation.  There is minimal left hip degenerative change with joint space loss, subchondral sclerosis and osteophytosis.  Limited visualization of the adjacent pelvis is normal.  Regional soft tissues are normal.  IMPRESSION: 1.  No fracture. 2.  Minimal degenerative change of the left hip.  Original Report Authenticated By: Rachel Moulds, M.D.   Dg Knee Complete 4 Views Left  08/08/2011  *RADIOLOGY REPORT*  Clinical Data: Post fall, now with knee pain  LEFT KNEE - COMPLETE 4+ VIEW  Comparison: Left knee radiographs - 03/25/2011  Findings: No fracture or dislocation. Redemonstrated minimal degenerative change of the medial compartment with joint space loss and minimal osteophytosis.  No joint effusion.  Stable sequela of prior MCL injury.  Minimal avulsive change of the quadriceps tendon insertion site. No  evidence of chondrocalcinosis.  Regional soft tissues are normal.  IMPRESSION: No fracture.  Original Report Authenticated By: Rachel Moulds, M.D.     MDM:Pt from NH s/p fall- slipped and fell out of bed.  Pt c/o pain in left elbow, left knee, left hip.  Pt is not ambulatory at baseline- uses wheelchair.  xrays obtained and reassuring.  Plan for discharge back to [facility] I personally performed  the services described in this documentation, which was scribed in my presence. The recorded information has been reviewed and considered.     [name], MD 08/08/11 [phone]

## 2011-08-08 NOTE — ED Notes (Signed)
Pt arrived from [facility] d/t a fall this am. Pt c/o pain to left arm and leg. ems reported pt rolled out of his bed this am.

## 2011-08-08 NOTE — ED Notes (Signed)
Pt c/o left arm and leg pain after falling this am.

## 2011-08-11 ENCOUNTER — Inpatient Hospital Stay (HOSPITAL_COMMUNITY)
Admission: EM | Admit: 2011-08-11 | Discharge: 2011-08-16 | DRG: 948 | Disposition: A | Discharge status: Skilled Nursing Facility | Payer: Medicare Other | Attending: Internal Medicine | Admitting: Internal Medicine

## 2011-08-11 ENCOUNTER — Encounter (HOSPITAL_COMMUNITY): Payer: Self-pay

## 2011-08-11 ENCOUNTER — Emergency Department (HOSPITAL_COMMUNITY): Payer: Medicare Other

## 2011-08-11 DIAGNOSIS — Z9889 Other specified postprocedural states: Secondary | ICD-10-CM

## 2011-08-11 DIAGNOSIS — E669 Obesity, unspecified: Secondary | ICD-10-CM | POA: Diagnosis present

## 2011-08-11 DIAGNOSIS — R269 Unspecified abnormalities of gait and mobility: Secondary | ICD-10-CM | POA: Diagnosis present

## 2011-08-11 DIAGNOSIS — M1990 Unspecified osteoarthritis, unspecified site: Secondary | ICD-10-CM | POA: Diagnosis present

## 2011-08-11 DIAGNOSIS — Z9181 History of falling: Secondary | ICD-10-CM

## 2011-08-11 DIAGNOSIS — E538 Deficiency of other specified B group vitamins: Secondary | ICD-10-CM | POA: Diagnosis present

## 2011-08-11 DIAGNOSIS — R4182 Altered mental status, unspecified: Principal | ICD-10-CM | POA: Diagnosis present

## 2011-08-11 DIAGNOSIS — F79 Unspecified intellectual disabilities: Secondary | ICD-10-CM | POA: Diagnosis present

## 2011-08-11 DIAGNOSIS — M25561 Pain in right knee: Secondary | ICD-10-CM

## 2011-08-11 DIAGNOSIS — I10 Essential (primary) hypertension: Secondary | ICD-10-CM | POA: Diagnosis present

## 2011-08-11 LAB — COMPREHENSIVE METABOLIC PANEL
ALT: 15 U/L (ref 0–53)
CO2: 32 mEq/L (ref 19–32)
Calcium: 9.4 mg/dL (ref 8.4–10.5)
Chloride: 99 mEq/L (ref 96–112)
GFR calc Af Amer: >90 mL/min (ref >90)
GFR calc non Af Amer: 87 mL/min — ABNORMAL LOW (ref >90)
Glucose, Bld: 102 mg/dL — ABNORMAL HIGH (ref 70–99)
Sodium: 139 mEq/L (ref 135–145)
Total Bilirubin: 0.3 mg/dL (ref 0.3–1.2)

## 2011-08-11 LAB — MRSA PCR SCREENING: MRSA by PCR: NEGATIVE

## 2011-08-11 LAB — CBC
Hemoglobin: 12.2 g/dL — ABNORMAL LOW (ref 13.0–17.0)
MCH: 26 pg (ref 26.0–34.0)
MCV: 84.3 fL (ref 78.0–100.0)
RBC: 4.7 MIL/uL (ref 4.22–5.81)

## 2011-08-11 MED ORDER — ATENOLOL 25 MG PO TABS
50.0000 mg | ORAL_TABLET | Freq: Every day | ORAL | Status: DC
  Administered 2011-08-12 – 2011-08-16 (×5): 50 mg via ORAL
  Filled 2011-08-11 (×4): qty 2
  Filled 2011-08-11: qty 1

## 2011-08-11 MED ORDER — ENOXAPARIN SODIUM 40 MG/0.4ML ~~LOC~~ SOLN
40.0000 mg | Freq: Every day | SUBCUTANEOUS | Status: DC
  Administered 2011-08-11 – 2011-08-16 (×6): 40 mg via SUBCUTANEOUS
  Filled 2011-08-11 (×5): qty 0.4

## 2011-08-11 MED ORDER — SIMVASTATIN 20 MG PO TABS
20.0000 mg | ORAL_TABLET | Freq: Every day | ORAL | Status: DC
  Administered 2011-08-11 – 2011-08-15 (×5): 20 mg via ORAL
  Filled 2011-08-11 (×5): qty 1

## 2011-08-11 MED ORDER — POTASSIUM CHLORIDE CRYS ER 20 MEQ PO TBCR
20.0000 meq | EXTENDED_RELEASE_TABLET | Freq: Two times a day (BID) | ORAL | Status: DC
  Administered 2011-08-11 – 2011-08-16 (×10): 20 meq via ORAL
  Filled 2011-08-11 (×10): qty 1

## 2011-08-11 MED ORDER — BENAZEPRIL HCL 10 MG PO TABS
30.0000 mg | ORAL_TABLET | Freq: Every day | ORAL | Status: DC
  Administered 2011-08-12 – 2011-08-16 (×5): 30 mg via ORAL
  Filled 2011-08-11 (×5): qty 3

## 2011-08-11 MED ORDER — FUROSEMIDE 40 MG PO TABS
40.0000 mg | ORAL_TABLET | Freq: Two times a day (BID) | ORAL | Status: DC
  Administered 2011-08-12 – 2011-08-16 (×9): 40 mg via ORAL
  Filled 2011-08-11 (×9): qty 1

## 2011-08-11 MED ORDER — PNEUMOCOCCAL VAC POLYVALENT 25 MCG/0.5ML IJ INJ
0.5000 mL | INJECTION | INTRAMUSCULAR | Status: AC
  Administered 2011-08-12: 0.5 mL via INTRAMUSCULAR
  Filled 2011-08-11: qty 0.5

## 2011-08-11 MED ORDER — TRAMADOL HCL 50 MG PO TABS: 50.0000 mg | ORAL_TABLET | Freq: Four times a day (QID) | ORAL | Status: DC | PRN

## 2011-08-11 MED ORDER — ASPIRIN EC 325 MG PO TBEC
325.0000 mg | DELAYED_RELEASE_TABLET | Freq: Every day | ORAL | Status: DC
  Administered 2011-08-12 – 2011-08-16 (×5): 325 mg via ORAL
  Filled 2011-08-11 (×5): qty 1

## 2011-08-11 MED ORDER — FAMOTIDINE 20 MG PO TABS
20.0000 mg | ORAL_TABLET | Freq: Every day | ORAL | Status: DC
  Administered 2011-08-11 – 2011-08-16 (×6): 20 mg via ORAL
  Filled 2011-08-11 (×6): qty 1

## 2011-08-11 MED ORDER — MAGNESIUM HYDROXIDE 400 MG/5ML PO SUSP: 30.0000 mL | Freq: Every day | ORAL | Status: DC | PRN

## 2011-08-11 MED ORDER — SODIUM CHLORIDE 0.9 % IV SOLN
INTRAVENOUS | Status: DC
  Administered 2011-08-11 – 2011-08-15 (×3): via INTRAVENOUS

## 2011-08-11 NOTE — ED Notes (Signed)
Attempted to call report. Nurse to call back shortly.

## 2011-08-11 NOTE — ED Notes (Signed)
Per ems, pt was in rehab and his rt knee became weak. Per the facility, pt was lowered to the floor.  Facility called for him to be re-evaluated for more specialty rehabilitation than they can provide.

## 2011-08-11 NOTE — ED Notes (Signed)
Report given to RN on unit 300. Ready to receive patient.

## 2011-08-11 NOTE — ED Provider Notes (Addendum)
History     CSN: [id number] Arrival date & time: 08/11/2011  3:14 PM   First MD Initiated Contact with Patient 08/11/11 1517      Chief Complaint  Patient presents with  . Fall    (Consider location/radiation/quality/duration/timing/severity/associated sxs/prior treatment) HPI This gentleman with dementia lives from his nursing home following a fall. He notes that he was in his usual state of health, while trying to ambulate he felt his right knee become weak, and fell striking the knee on the ground. Since the event he has had persistent pain focally about the anterior of the knee. The pain is worse with motion, minimally better with rest. The pain is described as a soreness. No other contact with anything during the fall, no other focal complaints. Past Medical History  Diagnosis Date  . Hypertension   . Obesity   . Bell's palsy   . Bleeding ulcer   . SOB (shortness of breath) on exertion   . Hyperlipemia   . DEMENTIA     disoriented to time.does not know medical hx.information obtained from previous charts    Past Surgical History  Procedure Date  . Foot surgery   . Knee arthroscopy 06/17/2011    Procedure: ARTHROSCOPY KNEE;  Surgeon: Sanjuana Kava;  Location: [facility];  Service: Orthopedics;  Laterality: Right;    Family History  Problem Relation Age of Onset  . Anesthesia problems Neg Hx   . Hypotension Neg Hx   . Malignant hyperthermia Neg Hx   . Pseudochol deficiency Neg Hx     History  Substance Use Topics  . Smoking status: Former Smoker -- 1.0 packs/day for 30 years    Types: Cigarettes  . Smokeless tobacco: Not on file  . Alcohol Use: No      Review of Systems  Unable to perform ROS: Dementia    Allergies  Hydrocodone-acetaminophen  Home Medications   Current Outpatient Rx  Name Route Sig Dispense Refill  . ACETAMINOPHEN ER 650 MG PO TBCR Oral Take 650 mg by mouth 2 (two) times daily.      . ASPIRIN EC 325 MG PO TBEC Oral Take 325 mg by mouth  daily.     . ATENOLOL 50 MG PO TABS Oral Take 50 mg by mouth daily.      Marland Kitchen BENAZEPRIL HCL 10 MG PO TABS Oral Take 30 mg by mouth daily.      Marland Kitchen CETIRIZINE HCL 10 MG PO TABS Oral Take 10 mg by mouth daily.      . FUROSEMIDE 40 MG PO TABS Oral Take 40 mg by mouth 2 (two) times daily.      Marland Kitchen MAGNESIUM HYDROXIDE 400 MG/5ML PO SUSP Oral Take 30 mLs by mouth daily as needed. For constipation     . NIACIN (ANTIHYPERLIPIDEMIC) 1000 MG PO TBCR Oral Take 1,000 mg by mouth at bedtime.      Marland Kitchen POTASSIUM CHLORIDE CRYS CR 20 MEQ PO TBCR Oral Take 40 mEq by mouth 2 (two) times daily.      Marland Kitchen RANITIDINE HCL 150 MG PO TABS Oral Take 150 mg by mouth daily.      Marland Kitchen SIMVASTATIN 20 MG PO TABS Oral Take 20 mg by mouth daily at 8 pm.      . TRAMADOL HCL 50 MG PO TABS Oral Take 50 mg by mouth every 6 (six) hours as needed. For pain Maximum dose= 8 tablets per day     . VITAMIN C 500 MG PO TABS Oral  Take 500 mg by mouth daily.        BP 140/82  Pulse 91  Temp(Src) 97.5 F (36.4 C) (Oral)  Resp 19  SpO2 94%  Physical Exam  Constitutional: He appears well-developed and well-nourished. No distress.  HENT:  Head: Normocephalic and atraumatic.  Eyes: Pupils are equal, round, and reactive to light.  Cardiovascular: Normal rate and regular rhythm.   Pulmonary/Chest: Effort normal and breath sounds normal.  Abdominal: Soft.  Musculoskeletal:       The left leg is within normal limits. The right hip has normal range of motion with 5 out of 5 strength. The right knee is mildly edematous, with pain on palpation of the patella. Axial loading did not produce any increase in pain at the joint lines. The strength of the right knee is 5 out of 5 in both extension and flexion there is pain noted with extension. The ankle, and foot are both within normal limits.  Neurological: He is alert. No cranial nerve deficit. Coordination normal.  Skin: Skin is warm and dry. No rash noted. No erythema.  Psychiatric:       Flat affect     ED Course  Procedures (including critical care time)  Labs Reviewed - No data to display No results found.   No diagnosis found.  XR: no acute findings of R knee  MDM  This 58 roll with history of dementia and prior right meniscal repair of the knee now presents following a seemingly mechanical fall. On exam the patient has mild tenderness, mild edema. X-ray does not demonstrate acute changes. At baseline the patient is wheelchair and walker dependent. Following initial evaluation, and return of x-rays, the patient was informed of the results. Prior to discharge, the director of the patient's nursing facility came to the hospital. She informs me that the patient is no longer appropriate for their facility. She notes that since his knee surgery one month ago, he has seemed progressively more demented, less capable of ambulating, and self-care. With today's, and last week's fall, she states that he is no longer well served at their facility. Although the patient is in no distress, seems close to his baseline, per prior physician documentation, he will be admitted for further evaluation, management, and assistance with new placement.  Dr. Lorriane Shire will admit the patient   Carmin Muskrat, MD 08/11/11 [place], MD 08/11/11 1910

## 2011-08-12 ENCOUNTER — Inpatient Hospital Stay (HOSPITAL_COMMUNITY): Payer: Medicare Other

## 2011-08-12 LAB — BASIC METABOLIC PANEL
BUN: 16 mg/dL (ref 6–23)
CO2: 33 mEq/L — ABNORMAL HIGH (ref 19–32)
Chloride: 102 mEq/L (ref 96–112)
Creatinine, Ser: 0.81 mg/dL (ref 0.50–1.35)
Glucose, Bld: 105 mg/dL — ABNORMAL HIGH (ref 70–99)

## 2011-08-12 LAB — CBC
HCT: 38.3 % — ABNORMAL LOW (ref 39.0–52.0)
Hemoglobin: 12.1 g/dL — ABNORMAL LOW (ref 13.0–17.0)
MCH: 26.5 pg (ref 26.0–34.0)
MCHC: 31.6 g/dL (ref 30.0–36.0)
MCV: 84 fL (ref 78.0–100.0)
RBC: 4.56 MIL/uL (ref 4.22–5.81)

## 2011-08-12 NOTE — Progress Notes (Signed)
UR Chart Review Completed  

## 2011-08-12 NOTE — H&P (Signed)
NAMEYOVANY, BRANDLE                   ACCOUNT NO.:  [id number]  MEDICAL RECORD NO.:  [id number]  LOCATION:  [id number]                          FACILITY:  [facility]  PHYSICIAN:  Broly Hatfield D. Legrand Rams, MD   DATE OF BIRTH:  1946-05-28  DATE OF ADMISSION:  08/11/2011 DATE OF DISCHARGE:  LH                             HISTORY & PHYSICAL   CHIEF COMPLAINT:  Change in mental status and recurrent falls.  HISTORY OF PRESENT ILLNESS:  [name] with a history of multiple medical illnesses who was a resident of [place] brought to emergency room with above complaint.  The patient recently had arthroscopic knee surgery and he was admitted to pain center to receive physical therapy.  The patient completed his physical therapy and the patient was readmitted to [facility]. However, according to the rest home staff, the patient became confused and agitated and he was falling in the rest home.  They were unable to manage the patient.  He was brought to emergency room and was admitted. X-ray of the left kidney was done where he had a fall.  X-ray was negative for any fracture.  The patient was admitted for further evaluation, probably for possible admission to a skilled nursing care.  REVIEW OF SYSTEMS:  The patient has no headaches, fever, chills, cough, shortness of breath, chest pain, nausea, vomiting, abdominal pain, dysuria, urgency, or frequency of urination.  PAST MEDICAL HISTORY: 1. Hypertension. 2. Hyperlipidemia. 3. Obesity. 4. Mental retardation. 5. History of GI bleed. 6. History of right knee arthritis and status post right open     arthroscopic surgery.  CURRENT MEDICATIONS: 1. Acetaminophen 650 mg 2 tablets daily. 2. Aspirin 81 mg daily. 3. Atenolol 50 mg daily. 4. Benazepril 30 mg daily. 5. Zyrtec 10 mg daily. 6. Lasix 40 mg daily. 7. Niacin 1000 mg daily. 8. KCl 20 mEq daily. 9. Zantac 150 mg daily. 10.Simvastatin 20 mg daily. 11.Vitamins  C 500 mg daily. 12.Milk of magnesia 30 mL daily p.r.n. 13.Tramadol 50 mg daily.  SOCIAL HISTORY:  The patient is currently a resident of [place].  He has history of mental retardation.  No history of alcohol, tobacco, or substance abuse.  FAMILY HISTORY:  His mother, brother and his father all have died. There is no further detailed history.  PHYSICAL EXAMINATION:  GENERAL:  The patient is alert, awake, and chronically sick looking. VITAL SIGNS:  Blood pressure 121/71, pulse 84, respiratory rate 17, temperature 97.5 degrees Fahrenheit. HEENT:  Pupils are equal and reactive. NECK:  Supple. CHEST:  Decreased air entry, few rhonchi. CARDIOVASCULAR SYSTEM:  First and second heart sounds heard.  No murmur. No gallop. ABDOMEN:  Soft and lax.  Bowel sound is positive.  No mass or organomegaly. EXTREMITIES:  No leg edema.  The patient has tenderness of the left knee.  ASSESSMENT: 1. Recurrent fall, probably secondary to abnormal gait. 2. Degenerative joint disease. 3. Morbid obesity. 4. Mental retardation. 5. History of hypertension.  PLAN:  We will do CT of the head.  We will continue physical therapy and occupational therapy.  Continue social service evaluation  for possible placement.  Continue regular medications.     Ambyr Qadri D. Legrand Rams, MD     TDF/MEDQ  D:  08/12/2011  T:  08/12/2011  Job:  [id number]

## 2011-08-12 NOTE — Progress Notes (Signed)
Physical Therapy Evaluation Patient Details Name: [name] MRN: [id number] DOB: 09-24-1946 Today's Date: 08/12/2011  Problem List:  Patient Active Problem List  Diagnoses  . CLOSED DISLOCATION OF FOOT UNSPECIFIED PART    Past Medical History:  Past Medical History  Diagnosis Date  . Hypertension   . Obesity   . Bell's palsy   . Bleeding ulcer   . SOB (shortness of breath) on exertion   . Hyperlipemia   . DEMENTIA     disoriented to time.does not know medical hx.information obtained from previous charts   Past Surgical History:  Past Surgical History  Procedure Date  . Foot surgery   . Knee arthroscopy 06/17/2011    Procedure: ARTHROSCOPY KNEE;  Surgeon: Sanjuana Kava;  Location: [facility];  Service: Orthopedics;  Laterality: Right;    PT Assessment/Plan/Recommendation PT Assessment Clinical Impression Statement: very pleasant pt who is cognitively very slow...he is severely deconditioned and incontinent...he needs mod to max assist for all mobility and apparently was in SNF just recently in order to develop increased strength...this was clearly not successful...he states that he primarily functions out of a w/c and this is probably his highest functional potential..Marland KitchenHe needs to be in a setting where max assist for ADLs and transfers can be provided PT Recommendation/Assessment: Patent does not need any further PT services No Skilled PT: Patient unable to participate in therapy PT Goals     PT Evaluation Precautions/Restrictions  Precautions Precautions: Fall Required Braces or Orthoses: No Restrictions Weight Bearing Restrictions: No Prior [place] Help From: Personal care attendant Type of Home: Assisted living Home Access: Level entry Prior Function Level of Independence: Needs assistance with ADLs;Needs assistance with tranfers (gait status is unknown) Driving: No Cognition Cognition Arousal/Alertness: Awake/alert Overall Cognitive  Status: History of cognitive impairments History of Cognitive Impairment: Appears at baseline functioning Orientation Level: Oriented to person;Oriented to place;Oriented to situation Sensation/Coordination Sensation Light Touch: Appears Intact Stereognosis: Not tested Hot/Cold: Not tested Proprioception: Not tested Coordination Gross Motor Movements are Fluid and Coordinated: Yes Fine Motor Movements are Fluid and Coordinated: No Extremity Assessment RUE Assessment RUE Assessment: Exceptions to [facility] RUE Strength RUE Overall Strength Comments: 3-/5 LUE Assessment LUE Assessment: Exceptions to [facility] LUE Strength LUE Overall Strength Comments: 3-/5 RLE Assessment RLE Assessment: Exceptions to [facility] RLE Strength RLE Overall Strength Comments: 2/5 LLE Assessment LLE Assessment: Exceptions to [facility] LLE Strength LLE Overall Strength Comments: 2/5 Mobility (including Balance) Bed Mobility Bed Mobility: Yes Rolling Right: 2: Max assist Rolling Left: 2: Max assist Supine to Sit: 2: Max assist Supine to Sit Details (indicate cue type and reason): pt has poor understanding of patterns of motion required to perform transfer Sitting - Scoot to Edge of Bed: 2: Max assist Sitting - Scoot to [employer] of Bed Details (indicate cue type and reason): unable to move pelvis on bed Transfers Transfers: Yes Sit to Stand: 2: Max assist;From bed;With upper extremity assist Sit to Stand Details (indicate cue type and reason): assist to shift weight anteriorly Stand to Sit: 3: Mod assist Stand to Sit Details: to control descent Lateral/Scoot Transfers: 2: Max assist Lateral/Scoot Transfer Details (indicate cue type and reason): pt is obese and has difficulty lifting some of body weight off of bed in order to slide Ambulation/Gait Ambulation/Gait: No (unable) Stairs: No Wheelchair Mobility Wheelchair Mobility: No  Posture/Postural Control Posture/Postural Control: No significant  limitations Balance Balance Assessed: Yes Static Sitting Balance Static Sitting - Level of Assistance: 6: Modified independent (Device/Increase  time) Dynamic Sitting Balance Dynamic Sitting - Level of Assistance: 6: Modified independent (Device/Increase time) Exercise    End of Session PT - End of Session Equipment Utilized During Treatment: Gait belt Activity Tolerance: Patient tolerated treatment well Patient left: in bed;with call bell in reach;with bed alarm set General Behavior During Session: [facility] for tasks performed Cognition: Impaired, at baseline  Demetrios Isaacs L 08/12/2011, 12:12 PM

## 2011-08-13 LAB — BASIC METABOLIC PANEL
CO2: 31 mEq/L (ref 19–32)
Glucose, Bld: 106 mg/dL — ABNORMAL HIGH (ref 70–99)
Potassium: 3.6 mEq/L (ref 3.5–5.1)
Sodium: 140 mEq/L (ref 135–145)

## 2011-08-13 MED ORDER — CYANOCOBALAMIN 1000 MCG/ML IJ SOLN
1000.0000 ug | Freq: Once | INTRAMUSCULAR | Status: AC
  Administered 2011-08-13: 1000 ug via INTRAMUSCULAR
  Filled 2011-08-13: qty 1

## 2011-08-13 NOTE — Progress Notes (Signed)
CSW spoke with MD regarding medical concerns reported by guardian including rapid decline and incontinence.  MD to consult neurology.  Tom, pt's guardian, notified and also accepts bed at [facility].  Facility notified.  Medicaid PA submitted.  Facility is aware pt will be Medicaid only.   Salome Arnt

## 2011-08-13 NOTE — Progress Notes (Signed)
[name], [name]                   ACCOUNT NO.:  [id number]  MEDICAL RECORD NO.:  [id number]  LOCATION:  [id number]                          FACILITY:  [facility]  PHYSICIAN:  Larrisa Cravey D. Legrand Rams, MD   DATE OF BIRTH:  December 14, 1945  DATE OF PROCEDURE:  08/13/2011 DATE OF DISCHARGE:                                PROGRESS NOTE   SUBJECTIVE:  The patient feels slightly better.  However, he is not able to ambulate.  OBJECTIVE:  GENERAL:  The patient is awake, alert, and sick looking. VITAL SIGNS:  Blood pressure 120/69, pulse 76, respiratory rate 20, temperature 97.5 degrees Fahrenheit. CHEST:  Decreased air entry, few rhonchi. CARDIOVASCULAR SYSTEM:  First and second heart sounds heard.  No murmur. No gallop.  ABDOMEN:  Soft and lax.  Bowel sound is positive.  No mass or organomegaly. EXTREMITIES:  No leg edema.  CT scan of the head showed no acute changes.  LABORATORY DATA:  Sodium 140, potassium 3.6, chloride 103, carbon dioxide 31, glucose 106, BUN 12, creatinine 0.8, calcium 8.8, and vitamin B is 159.  ASSESSMENT: 1. Change in mental status, etiology not clear. 2. History of recurrent fall. 3. Mental retardation. 4. Obesity. 5. Hypertension. 6. Vitamin B12 deficiency.  PLAN:  We will supplement vitamin B.  We will do neurology consult.  We will continue current medications.  We will do physical therapy.     Khari Mally D. Legrand Rams, MD     TDF/MEDQ  D:  08/13/2011  T:  08/13/2011  Job:  [id number]

## 2011-08-13 NOTE — Progress Notes (Signed)
UR Chart Review Completed  

## 2011-08-14 NOTE — Progress Notes (Signed)
Subjective: [name] of Dr. Josephine Cables who has come to the hospital because of change in mental status and multiple falls. He had become agitated and confused and was brought to the emergency room. He did not show any fracture. He has had knee surgery fairly recently. This morning he says he feels well and has no complaints. He denies pain. He says he feels more like himself.  Objective: Vital signs in last 24 hours: Temp:  [97.2 F (36.2 C)-97.6 F (36.4 C)] 97.2 F (36.2 C) (11/17 0600) Pulse Rate:  [67-69] 67  (11/17 0600) Resp:  [16-18] 18  (11/17 0600) BP: (132-134)/(76-78) 132/76 mmHg (11/17 0600) SpO2:  [93 %-94 %] 93 % (11/17 0600) Weight change:  Last BM Date: 08/12/11  Intake/Output from previous day: 11/16 0701 - 11/17 0700 In: 3086 [P.O.:1380; I.V.:1706] Out: 1225 [Urine:1225]  PHYSICAL EXAM General appearance: alert, cooperative and no distress Resp: clear to auscultation bilaterally Cardio: regular rate and rhythm, S1, S2 normal, no murmur, click, rub or gallop GI: soft, non-tender; bowel sounds normal; no masses,  no organomegaly Extremities: extremities normal, atraumatic, no cyanosis or edema  Lab Results:    Basic Metabolic Panel:  Basename 08/13/11 0521 08/12/11 0449  NA 140 141  K 3.6 3.1*  CL 103 102  CO2 31 33*  GLUCOSE 106* 105*  BUN 12 16  CREATININE 0.84 0.81  CALCIUM 8.8 9.0  MG -- --  PHOS -- --   Liver Function Tests:  Basename 08/11/11 1657  AST 19  ALT 15  ALKPHOS 126*  BILITOT 0.3  PROT 7.3  ALBUMIN 3.9   No results found for this basename: LIPASE:2,AMYLASE:2 in the last 72 hours No results found for this basename: AMMONIA:2 in the last 72 hours CBC:  Basename 08/12/11 0449 08/11/11 1657  WBC 5.0 5.7  NEUTROABS -- --  HGB 12.1* 12.2*  HCT 38.3* 39.6  MCV 84.0 84.3  PLT 147* 159   Cardiac Enzymes: No results found for this basename: CKTOTAL:3,CKMB:3,CKMBINDEX:3,TROPONINI:3 in the last 72  hours BNP: No results found for this basename: POCBNP:3 in the last 72 hours D-Dimer: No results found for this basename: DDIMER:2 in the last 72 hours CBG: No results found for this basename: GLUCAP:6 in the last 72 hours Hemoglobin A1C: No results found for this basename: HGBA1C in the last 72 hours Fasting Lipid Panel: No results found for this basename: CHOL,HDL,LDLCALC,TRIG,CHOLHDL,LDLDIRECT in the last 72 hours Thyroid Function Tests:  [facility] 08/12/11 0449  TSH 1.547  T4TOTAL --  Delcie Roch --  T3FREE --  THYROIDAB --   Anemia Panel:  Basename 08/12/11 0449  VITAMINB12 159*  FOLATE 14.0  FERRITIN --  TIBC --  IRON --  RETICCTPCT --   Coagulation: No results found for this basename: LABPROT:2,INR:2 in the last 72 hours Urine Drug Screen:  Alcohol Level: No results found for this basename: ETH:2 in the last 72 hours Urinalysis:  Misc. Labs:  ABGS No results found for this basename: PHART,PCO2,PO2ART,TCO2,HCO3 in the last 72 hours CULTURES Recent Results (from the past 240 hour(s))  MRSA PCR SCREENING     Status: Normal   Collection Time   08/11/11  8:59 PM      Component Value Range Status Comment   MRSA by PCR NEGATIVE  NEGATIVE  Final    Studies/Results: No results found.  Medications:  Prior to Admission:  Prescriptions prior to admission  Medication Sig Dispense Refill  . acetaminophen (QC ARTHRITIS PAIN RELIEF) 650 MG  CR tablet Take 650 mg by mouth 2 (two) times daily.        Marland Kitchen aspirin EC 325 MG tablet Take 325 mg by mouth daily.       Marland Kitchen atenolol (TENORMIN) 50 MG tablet Take 50 mg by mouth daily.        . benazepril (LOTENSIN) 10 MG tablet Take 30 mg by mouth daily.        . cetirizine (ZYRTEC) 10 MG tablet Take 10 mg by mouth daily.        . furosemide (LASIX) 40 MG tablet Take 40 mg by mouth 2 (two) times daily.        . niacin (NIASPAN) 1000 MG CR tablet Take 1,000 mg by mouth at bedtime.       . potassium chloride SA (K-DUR,KLOR-CON) 20 MEQ  tablet Take 40 mEq by mouth 2 (two) times daily.        . ranitidine (ZANTAC) 150 MG tablet Take 150 mg by mouth daily.        . simvastatin (ZOCOR) 20 MG tablet Take 20 mg by mouth daily at 8 pm.        . vitamin C (ASCORBIC ACID) 500 MG tablet Take 500 mg by mouth daily.        . magnesium hydroxide (MILK OF MAGNESIA) 400 MG/5ML suspension Take 30 mLs by mouth daily as needed. For constipation       . traMADol (ULTRAM) 50 MG tablet Take 50 mg by mouth every 6 (six) hours as needed. For pain Maximum dose= 8 tablets per day        Scheduled:   . aspirin EC  325 mg Oral Daily  . tenormin  50 mg Oral Daily  . benazepril  30 mg Oral Daily  . cyanocobalamin  1,000 mcg Intramuscular Once  . enoxaparin (LOVENOX) injection  40 mg Subcutaneous Daily  . famotidine  20 mg Oral Daily  . furosemide  40 mg Oral BID  . potassium chloride  20 mEq Oral BID  . simvastatin  20 mg Oral QHS   Continuous:   . sodium chloride 75 mL/hr at 08/12/11 1309   [id number] hydroxide, traMADol  Assesment: He is admitted with change in mental status. He has not been able to ambulate and he has fallen multiple times. He has a history of mental retardation and I think she's back about to his baseline as far as mental status is concerned. He has hypertension which is been well-controlled. Active Problems:  * No active hospital problems. *     Plan: No change in his treatments he seems to be doing better.    LOS: 3 days   Khyleigh Furney L 08/14/2011, 11:17 AM

## 2011-08-15 NOTE — Progress Notes (Signed)
NAMENASZIR, RIGANO                   ACCOUNT NO.:  [id number]  MEDICAL RECORD NO.:  [id number]  LOCATION:  [id number]                          FACILITY:  [facility]  PHYSICIAN:  Ebert Forrester D. Legrand Rams, MD   DATE OF BIRTH:  Sep 27, 1946  DATE OF PROCEDURE:  08/15/2011 DATE OF DISCHARGE:                                PROGRESS NOTE   SUBJECTIVE:  The patient feels much better.  He is trying to work with physical therapy.  His knee is feeling better.  No new complaints.  OBJECTIVE:  GENERAL:  The patient is more alert, awake, and responding to verbal communication. VITAL SIGNS:  Blood pressure 105/64, pulse 70, respiratory rate 18, temperature 97 degrees Fahrenheit. CHEST:  Clear lung fields.  Good air entry. CARDIOVASCULAR SYSTEM:  First and second heart sounds heard.  No murmur. No gallop.  ABDOMEN:  Soft and lax.  Bowel sound is positive.  No mass or organomegaly. EXTREMITIES:  No leg edema.  ASSESSMENT: 1. Change in mental status due to multifactorial causes. 2. History of recurrent fall. 3. History of mental retardation. 4. Obesity. 5. History of degenerative joint disease.  PLAN:  We will continue the patient on current medications.  We will plan to discharge the patient to skilled nursing home once he is stable.     Birdell Frasier D. Legrand Rams, MD     TDF/MEDQ  D:  08/15/2011  T:  08/15/2011  Job:  [id number]

## 2011-08-16 MED ORDER — ONDANSETRON 4 MG PO TBDP
4.0000 mg | ORAL_TABLET | Freq: Four times a day (QID) | ORAL | Status: DC | PRN
  Administered 2011-08-16: 4 mg via ORAL
  Filled 2011-08-16: qty 1

## 2011-08-16 MED ORDER — PNEUMOCOCCAL VAC POLYVALENT 25 MCG/0.5ML IJ INJ
0.5000 mL | INJECTION | INTRAMUSCULAR | Status: DC
  Filled 2011-08-16: qty 0.5

## 2011-08-16 MED ORDER — ONDANSETRON HCL 4 MG/2ML IJ SOLN: 4.0000 mg | Freq: Four times a day (QID) | INTRAMUSCULAR | Status: DC | PRN

## 2011-08-16 NOTE — Discharge Summary (Signed)
[name], [name]                   ACCOUNT NO.:  [id number]  MEDICAL RECORD NO.:  [id number]  LOCATION:  [id number]                          FACILITY:  [facility]  PHYSICIAN:  Leighann Amadon D. Legrand Rams, MD   DATE OF BIRTH:  07/31/1946  DATE OF ADMISSION:  08/11/2011 DATE OF DISCHARGE:  11/19/2012LH                              DISCHARGE SUMMARY   DISCHARGE DIAGNOSES: 1. Change in mental status. 2. Recurrent fall. 3. Abnormal gait. 4. Degenerative joint disease. 5. History of mental retardation. 6. Hypertension. 7. Obesity. 8. Degenerative joint disease. 9. Status post arthroscopic knee surgery.  DISCHARGE MEDICATIONS: 1. Acetaminophen 650 q.6 hours p.r.n. 2. Aspirin 81 mg daily. 3. Tylenol 50 mg daily. 4. Benazepril 10 mg p.o. daily. 5. Zyrtec 10 mg daily. 6. Pepcid 20 mg daily. 7. Lasix 40 mg daily. 8. Milk of magnesium 30 mL p.o. p.r.n. 9. KCl 20 mEq p.o. b.i.d. 10.Simvastatin 20 mg p.o. daily. 11.Tramadol 50 mg p.o. q.6 hours p.r.n. 12.Ascorbic acid 500 mg p.o. daily.  DISPOSITION:  The patient will be transferred to nursing home.  DISCHARGE INSTRUCTIONS:  The patient to be transferred to skilled nursing care in stable condition.  He will continue physical therapy as the patient tolerates.  LABS ON DISCHARGE: Sodium 140, potassium 3.6, chloride 103, carbon dioxide 31, glucose 106, BUN 12, creatinine 0.8, calcium 8.8.  TSH 1.57.  CBC:  WBC 5.0, hemoglobin 12.1, hematocrit 38.3, and platelets 147.  Vitamins B12 159.  HOSPITAL COURSE:  [name] with multiple medical history, who was a resident of [place], was admitted due to change in mental status and recurrent falls.  He had gait abnormality.  On admission, the patient was evaluated and his baseline labs and CT scan of the head was negative.  He was admitted and monitored.  His symptoms gradually improved.  His vitamin B12 level was found to be low.  He was given a dose of vitamin B12 1000  units IM.  The patient may need to continue this every month until the level is rechecked and comes to normal level.  The patient requires at this time higher level of care.  Arrangement is going to be made to discharge him to skilled nursing care.  He will continue physical therapy and current treatment.     [name] D. Legrand Rams, MD     TDF/MEDQ  D:  08/16/2011  T:  08/16/2011  Job:  [id number]

## 2011-08-16 NOTE — Progress Notes (Signed)
NAMESY, SCHLOTTERBECK                   ACCOUNT NO.:  [id number]  MEDICAL RECORD NO.:  [id number]  LOCATION:  [id number]                          FACILITY:  [facility]  PHYSICIAN:  Kacia Halley D. Legrand Rams, MD   DATE OF BIRTH:  1945/10/31  DATE OF PROCEDURE:  08/16/2011 DATE OF DISCHARGE:                                PROGRESS NOTE   SUBJECTIVE:  The patient is feeling better.  His knees are improving and his pain is less.  No new complaints.  OBJECTIVE:  GENERAL:  The patient is more alert, awake, and comfortable. VITAL SIGNS:  Blood pressure 124/75, pulse 24, temperature 98.2, and pulse 74. CHEST:  Clear lung fields.  Good air entry. CARDIOVASCULAR:  First and second heart sounds heard.  No murmur.  No gallop. ABDOMEN:  Soft and lax.  Bowel sound is positive.  No mass or organomegaly. EXTREMITIES:  No leg edema.  ASSESSMENT: 1. Change in mental status, clinically improved. 2. History of recurrent fall. 3. Obesity. 4. Mental retardation.  PLAN:  The patient has improved and he is back to his baseline.  We will plan to discharge him to a skilled nursing care today.     Jalesha Plotz D. Legrand Rams, MD     TDF/MEDQ  D:  08/16/2011  T:  08/16/2011  Job:  [id number]

## 2011-08-16 NOTE — Progress Notes (Signed)
08/16/11 1322 patient being discharged to [facility] today. Called report to Fraser Din, receiving nurse at [facility]. Pt in stable condition, awaiting transfer via EMS transport.

## 2011-08-16 NOTE — Progress Notes (Signed)
Pt to D/C today.  CSW spoke with Pt's guardian, Roylene Reason [phone]) and with staff at [facility].  Both are in agreement with plan.  Pt to be transported by EMS.  CSW will sign off at this time.

## 2011-08-16 NOTE — Progress Notes (Signed)
CSW spoke with Debbie from [facility].  She confirmed that she had spoken with Pt's guardian and Pt's transfer has been approved.  Pt will be transported by [facility] CSW will sign off at this time.

## 2011-09-14 ENCOUNTER — Encounter: Payer: Self-pay | Admitting: Internal Medicine

## 2011-10-04 ENCOUNTER — Ambulatory Visit: Payer: Medicare Other | Admitting: Gastroenterology

## 2011-10-06 ENCOUNTER — Encounter: Payer: Self-pay | Admitting: Internal Medicine

## 2011-10-07 ENCOUNTER — Ambulatory Visit (INDEPENDENT_AMBULATORY_CARE_PROVIDER_SITE_OTHER): Payer: Medicare Other | Admitting: Gastroenterology

## 2011-10-07 ENCOUNTER — Encounter: Payer: Self-pay | Admitting: Gastroenterology

## 2011-10-07 DIAGNOSIS — Z1211 Encounter for screening for malignant neoplasm of colon: Secondary | ICD-10-CM

## 2011-10-07 NOTE — Patient Instructions (Signed)
Due to your recovery with your leg, we will have you continuing working with Physical Therapy and return to see [place] in March to set up a colonoscopy.  However, if you notice any change in your bowel habits, any bleeding, and abdominal pain, please notify us immediately.

## 2011-10-07 NOTE — Progress Notes (Signed)
Primary Care Physician:  Rosita Fire, MD Primary Gastroenterologist:  Dr. Gala Romney  Chief Complaint  Patient presents with  . Colonoscopy    HPI:   [name] is a 66 year old male who presents today for a visit prior to updated screening colonoscopy. Last was performed in Jan 2008 with poor prep; therefore, it was recommended to update this in 5 years. He unfortunately underwent knee surgery due to meniscus tear in September and suffered a fall several months later, requiring hospital admission again. He is undergoing physical therapy now. He was originally at [facility], where he has stayed since his parents died many years ago. He is now recovering at [facility]. He has a history of mental retardation. He presents in a wheel chair and is quite pleasant. Denies any abdominal pain, N/V. Denies any melena or hematochezia. No upper GI symptoms. He requires a lift to get him from bed to chair.   He is aware of the place and situation, unsure of year and president.    Past Medical History  Diagnosis Date  . Hypertension   . Obesity   . Bell's palsy   . Bleeding ulcer     remote past  . SOB (shortness of breath) on exertion   . Hyperlipemia   . DEMENTIA     disoriented to time.does not know medical hx.information obtained from previous charts    Past Surgical History  Procedure Date  . Foot surgery   . Knee arthroscopy 06/17/2011    Procedure: ARTHROSCOPY KNEE;  Surgeon: Sanjuana Kava;  Location: [facility];  Service: Orthopedics;  Laterality: Right;  . Colonoscopy 10/06/06    internal hemorrhoids and anal papilla/poor prep    Current Outpatient Prescriptions  Medication Sig Dispense Refill  . acetaminophen (QC ARTHRITIS PAIN RELIEF) 650 MG CR tablet Take 650 mg by mouth 2 (two) times daily.        Marland Kitchen aspirin EC 325 MG tablet Take 325 mg by mouth daily.       Marland Kitchen atenolol (TENORMIN) 50 MG tablet Take 50 mg by mouth daily.        Marland Kitchen atorvastatin (LIPITOR) 10 MG tablet Take 10 mg by mouth daily.       . benazepril (LOTENSIN) 10 MG tablet Take 30 mg by mouth daily.        . cetirizine (ZYRTEC) 10 MG tablet Take 10 mg by mouth daily.        . CYANOCOBALAMIN IJ Inject 1 mL as directed.      . furosemide (LASIX) 40 MG tablet Take 40 mg by mouth 2 (two) times daily.        . magnesium hydroxide (MILK OF MAGNESIA) 400 MG/5ML suspension Take 30 mLs by mouth daily as needed. For constipation       . potassium chloride SA (K-DUR,KLOR-CON) 20 MEQ tablet Take 40 mEq by mouth 2 (two) times daily.        . traMADol (ULTRAM) 50 MG tablet Take 50 mg by mouth every 6 (six) hours as needed. For pain Maximum dose= 8 tablets per day       . vitamin C (ASCORBIC ACID) 500 MG tablet Take 500 mg by mouth daily.        . niacin (NIASPAN) 1000 MG CR tablet Take 1,000 mg by mouth at bedtime.       . ranitidine (ZANTAC) 150 MG tablet Take 150 mg by mouth daily.        . simvastatin (ZOCOR) 20 MG tablet Take 20  mg by mouth daily at 8 pm.          Allergies as of 10/07/2011 - Review Complete 10/07/2011  Allergen Reaction Noted  . Hydrocodone-acetaminophen  06/17/2011    Family History  Problem Relation Age of Onset  . Anesthesia problems Neg Hx   . Hypotension Neg Hx   . Malignant hyperthermia Neg Hx   . Pseudochol deficiency Neg Hx   . COPD Mother   . Cancer Brother   . Colon cancer Neg Hx     History   Social History  . Marital Status: Single    Spouse Name: N/A    Number of Children: N/A  . Years of Education: N/A   Occupational History  . Not on file.   Social History Main Topics  . Smoking status: Former Smoker -- 1.0 packs/day for 30 years    Types: Cigarettes  . Smokeless tobacco: Not on file   Comment: quit a "long time ago"   . Alcohol Use: No  . Drug Use: No  . Sexually Active: No   Other Topics Concern  . Not on file   Social History Narrative  . No narrative on file    Review of Systems: Gen: Denies any fever, chills, fatigue, weight loss, lack of appetite.  CV:  Denies chest pain, heart palpitations, peripheral edema, syncope.  Resp: Denies shortness of breath at rest or with exertion. Denies wheezing or cough.  GI: Denies dysphagia or odynophagia. Denies jaundice, hematemesis, fecal incontinence. GU : Denies urinary burning, urinary frequency, urinary hesitancy MS: Denies joint pain, muscle weakness, cramps, or limitation of movement.  Derm: Denies rash, itching, dry skin Psych: Denies depression, anxiety, memory loss, and confusion Heme: Denies bruising, bleeding, and enlarged lymph nodes.  Physical Exam: Limited, pt in wheelchair, unable to get on table BP 116/71  Pulse 78  Temp(Src) 98.7 F (37.1 C) (Temporal)  Ht 5\' 7"  (1.702 m)  Wt 230 lb (104.327 kg)  BMI 36.02 kg/m2 General:   Alert and oriented. Pleasant and cooperative. Well-nourished and well-developed.  Head:  Normocephalic and atraumatic. Eyes:  Without icterus, sclera clear and conjunctiva pink.  Ears:  Normal auditory acuity. Nose:  No deformity, discharge,  or lesions. Mouth:  No dentition, oral mucosa pink and moist.   Neck:  Supple, without mass or thyromegaly. Lungs:  Clear to auscultation bilaterally. No wheezes, rales, or rhonchi. No distress.  Heart:  S1, S2 present without murmurs appreciated.  Abdomen:  +BS, soft, non-tender and non-distended. No HSM noted. No guarding or rebound. No masses appreciated.  Rectal:  Deferred  Msk:  Symmetrical without gross deformities. Normal posture. Pulses:  Normal pulses noted. Extremities:  Without clubbing or edema. Neurologic:  Alert to person, place, situation. Unsure year or president.  Skin:  Intact without significant lesions or rashes. Cervical Nodes:  No significant cervical adenopathy. Psych:  Alert and cooperative. Normal mood and affect.

## 2011-10-08 ENCOUNTER — Encounter: Payer: Self-pay | Admitting: Gastroenterology

## 2011-10-08 NOTE — Assessment & Plan Note (Signed)
66 year old male with hx of mental retardation, presenting for updated screening colonoscopy due to poor prep 5 years ago. No significant findings on last exam in 2008. Has had hospitalization in Sept for knee arthroscopy and then suffered a fall, requiring hospital admission recently. He now requires physical therapy and a lift to move from bed to chair. Prior to this happening, he was ambulatory. He has no lower or upper GI symptoms currently. At this time, I am concerned about him tolerating the prep; as he is not having issues, I'd like to give him more time to recover. We will have him return in March, when we will likely proceed with a screening colonoscopy at that time. He was agreeable to this and had no further questions.  As of note, Hgb very mildly decreased at 12.2 during Nov 2012. May be due to hospitalization, hemodilution? Will reassess for stability.

## 2011-10-08 NOTE — Progress Notes (Unsigned)
Let's get an updated CBC for pt. May need to fax order to [facility]. Thanks! This is due to Hgb 12.2 in Nov. Diagnosis: Anemia.

## 2011-10-11 ENCOUNTER — Other Ambulatory Visit: Payer: Self-pay | Admitting: Gastroenterology

## 2011-10-11 DIAGNOSIS — D649 Anemia, unspecified: Secondary | ICD-10-CM

## 2011-10-11 NOTE — Progress Notes (Signed)
Lab order and note faxed to [facility].

## 2011-10-11 NOTE — Progress Notes (Signed)
Faxed to PCP

## 2012-02-28 ENCOUNTER — Emergency Department (HOSPITAL_COMMUNITY)
Admission: EM | Admit: 2012-02-28 | Discharge: 2012-02-28 | Disposition: A | Discharge status: Home / Self Care | Payer: Medicare Other | Attending: Emergency Medicine | Admitting: Emergency Medicine

## 2012-02-28 ENCOUNTER — Encounter (HOSPITAL_COMMUNITY): Payer: Self-pay | Admitting: Emergency Medicine

## 2012-02-28 ENCOUNTER — Emergency Department (HOSPITAL_COMMUNITY): Payer: Medicare Other

## 2012-02-28 DIAGNOSIS — G510 Bell's palsy: Secondary | ICD-10-CM | POA: Insufficient documentation

## 2012-02-28 DIAGNOSIS — W19XXXA Unspecified fall, initial encounter: Secondary | ICD-10-CM

## 2012-02-28 DIAGNOSIS — M546 Pain in thoracic spine: Secondary | ICD-10-CM | POA: Insufficient documentation

## 2012-02-28 DIAGNOSIS — W050XXA Fall from non-moving wheelchair, initial encounter: Secondary | ICD-10-CM | POA: Insufficient documentation

## 2012-02-28 DIAGNOSIS — R51 Headache: Secondary | ICD-10-CM | POA: Insufficient documentation

## 2012-02-28 DIAGNOSIS — I10 Essential (primary) hypertension: Secondary | ICD-10-CM | POA: Insufficient documentation

## 2012-02-28 DIAGNOSIS — F068 Other specified mental disorders due to known physiological condition: Secondary | ICD-10-CM | POA: Insufficient documentation

## 2012-02-28 DIAGNOSIS — Y9210 Unspecified residential institution as the place of occurrence of the external cause: Secondary | ICD-10-CM | POA: Insufficient documentation

## 2012-02-28 DIAGNOSIS — M549 Dorsalgia, unspecified: Secondary | ICD-10-CM

## 2012-02-28 NOTE — ED Provider Notes (Signed)
History   This chart was scribed for Douglas Essex, MD by Roe Coombs. The patient was seen in room [facility]10/[facility]10. Patient's care was started at [facility].     CSN: [id number]  Arrival date & time 02/28/12  1803   First MD Initiated Contact with Patient 02/28/12 1816      Chief Complaint  Patient presents with  . Fall    (Consider location/radiation/quality/duration/timing/severity/associated sxs/prior treatment) HPI [name] is a 66 y.o. male who presents to the Emergency Department complaining of a fall while in his wheelchair. Patient has associated back pain. Patient said he was rushing to get food at [place] when his chair tumbled backwards. Patient reports that he wasn't experiencing back pain before the fall. Patient is nonambulatory. Patient says that he is in physical therapy. Patient denies LOC. Patient with h/o of knee arthroscopy in September 2012. Patient also with h/o HTN, obesity, Bell's Palsy, hyperlipidemia, and dementia.  Patient is a resident at [facility].  Past Medical History  Diagnosis Date  . Hypertension   . Obesity   . Bell's palsy   . Bleeding ulcer     remote past  . SOB (shortness of breath) on exertion   . Hyperlipemia   . DEMENTIA     disoriented to time.does not know medical hx.information obtained from previous charts    Past Surgical History  Procedure Date  . Foot surgery   . Knee arthroscopy 06/17/2011    Procedure: ARTHROSCOPY KNEE;  Surgeon: Sanjuana Kava;  Location: [facility];  Service: Orthopedics;  Laterality: Right;  . Colonoscopy 10/06/06    internal hemorrhoids and anal papilla/poor prep    Family History  Problem Relation Age of Onset  . Anesthesia problems Neg Hx   . Hypotension Neg Hx   . Malignant hyperthermia Neg Hx   . Pseudochol deficiency Neg Hx   . COPD Mother   . Cancer Brother   . Colon cancer Neg Hx     History  Substance Use Topics  . Smoking status: Former Smoker -- 1.0 packs/day for 30 years    Types: Cigarettes    . Smokeless tobacco: Not on file   Comment: quit a "long time ago"   . Alcohol Use: No      Review of Systems  Allergies  Hydrocodone-acetaminophen  Home Medications   Current Outpatient Rx  Name Route Sig Dispense Refill  . ACETAMINOPHEN ER 650 MG PO TBCR Oral Take 650 mg by mouth 2 (two) times daily.      . ASPIRIN EC 325 MG PO TBEC Oral Take 325 mg by mouth daily.     . ATENOLOL 50 MG PO TABS Oral Take 50 mg by mouth daily.      . ATORVASTATIN CALCIUM 10 MG PO TABS Oral Take 10 mg by mouth daily.    Marland Kitchen BENAZEPRIL HCL 10 MG PO TABS Oral Take 30 mg by mouth daily.      Marland Kitchen CETIRIZINE HCL 10 MG PO TABS Oral Take 10 mg by mouth daily.      . CYANOCOBALAMIN IJ Injection Inject 1 mL as directed.    . FUROSEMIDE 40 MG PO TABS Oral Take 40 mg by mouth 2 (two) times daily.      Marland Kitchen MAGNESIUM HYDROXIDE 400 MG/5ML PO SUSP Oral Take 30 mLs by mouth daily as needed. For constipation     . NIACIN ER (ANTIHYPERLIPIDEMIC) 1000 MG PO TBCR Oral Take 1,000 mg by mouth at bedtime.     Marland Kitchen POTASSIUM  CHLORIDE CRYS ER 20 MEQ PO TBCR Oral Take 40 mEq by mouth 2 (two) times daily.      Marland Kitchen RANITIDINE HCL 150 MG PO TABS Oral Take 150 mg by mouth daily.      Marland Kitchen SIMVASTATIN 20 MG PO TABS Oral Take 20 mg by mouth daily at 8 pm.      . TRAMADOL HCL 50 MG PO TABS Oral Take 50 mg by mouth every 6 (six) hours as needed. For pain Maximum dose= 8 tablets per day     . VITAMIN C 500 MG PO TABS Oral Take 500 mg by mouth daily.        There were no vitals taken for this visit.  Physical Exam  Nursing note and vitals reviewed. Constitutional: He is oriented to person, place, and time. He appears well-developed and well-nourished.  HENT:  Head: Normocephalic and atraumatic.  Eyes: Conjunctivae and EOM are normal. Pupils are equal, round, and reactive to light.  Neck: Normal range of motion. Neck supple.  Cardiovascular: Normal rate, regular rhythm and normal heart sounds.   No murmur heard. Pulmonary/Chest: Effort  normal and breath sounds normal. No respiratory distress. He has no wheezes.  Abdominal: Soft. Bowel sounds are normal. He exhibits no distension. There is no tenderness. There is no rebound and no guarding.  Musculoskeletal: Normal range of motion. He exhibits tenderness.       Thoracic midline tenderness. No step off or deformity.  Neurological: He is alert and oriented to person, place, and time.       No c-spine pain. No step off or deformity.  Skin: Skin is warm and dry.  Psychiatric: He has a normal mood and affect.    ED Course  Procedures (including critical care time) COORDINATION OF CARE: 6:23PM - Patient informed of current plan for treatment and evaluation and agrees with plan at this time.     Labs Reviewed - No data to display Dg Thoracic Spine 2 View  02/28/2012  *RADIOLOGY REPORT*  Clinical Data: Wheel chair fell backwards.  Upper back pain.  THORACIC SPINE - 2 VIEW  Comparison: None.  Findings: Bones are diffusely demineralized which limits assessment.  No acute fracture can be identified in the thoracic spine.  Intervertebral disc spaces are largely preserved.  Frontal film shows no abnormal paraspinal line.  IMPRESSION: Demineralization limits assessment, but no thoracic spine fracture is evident.  Original Report Authenticated By: ERIC A. MANSELL, M.D.   Ct Head Wo Contrast  02/28/2012  *RADIOLOGY REPORT*  Clinical Data: [place] over backwards.  Headache.  CT HEAD WITHOUT CONTRAST  Technique:  Contiguous axial images were obtained from the base of the skull through the vertex without contrast.  Comparison: 08/11/2011  Findings: There is no evidence for acute hemorrhage, hydrocephalus, mass lesion, or abnormal extra-axial fluid collection.  No definite CT evidence for acute infarction.  Chronic calcification again noted in the basal ganglia and deep hemispheric white matter bilaterally.  The visualized paranasal sinuses are clear.  As before, there is fluid in the right mastoid  air cells and middle ear.  IMPRESSION: Stable exam.  No acute intracranial abnormality.  Chronic basal ganglia and deep white matter mineralization.  Stable appearance of fluid in the right mastoid air cells and middle ear.  Original Report Authenticated By: ERIC A. MANSELL, M.D.     No diagnosis found.    MDM  Fall from wheelchair after falling backwards.  No LOC.  Pain in thoracic spine.  Wheelchair bound  at baseline.  No focal weakness, numbness, tingling.  No blood thinners.  TTP thoracic spine only.  No neuro deficits.  Xray negative.   I personally performed the services described in this documentation, which was scribed in my presence.  The recorded information has been reviewed and considered.   Douglas Essex, MD 02/29/12 1136

## 2012-02-28 NOTE — ED Notes (Signed)
Pt from [facility]. In w/c due to nonambulatory, was rushing to get some food and w/c went back wards. Pt is alert/oriented to some. Knows his name and birthmonth. States did not hit head but pain to mid back area. Pt fully immobilized. Pupils perrla. No loc.

## 2012-02-28 NOTE — Discharge Instructions (Signed)
Back Pain, Adult Low back pain is very common. About 1 in 5 people have back pain.The cause of low back pain is rarely dangerous. The pain often gets better over time.About half of people with a sudden onset of back pain feel better in just 2 weeks. About 8 in 10 people feel better by 6 weeks.  CAUSES Some common causes of back pain include:  Strain of the muscles or ligaments supporting the spine.   Wear and tear (degeneration) of the spinal discs.   Arthritis.   Direct injury to the back.  DIAGNOSIS Most of the time, the direct cause of low back pain is not known.However, back pain can be treated effectively even when the exact cause of the pain is unknown.Answering your caregiver's questions about your overall health and symptoms is one of the most accurate ways to make sure the cause of your pain is not dangerous. If your caregiver needs more information, he or she may order lab work or imaging tests (X-rays or MRIs).However, even if imaging tests show changes in your back, this usually does not require surgery. HOME CARE INSTRUCTIONS For many people, back pain returns.Since low back pain is rarely dangerous, it is often a condition that people can learn to manageon their own.   Remain active. It is stressful on the back to sit or stand in one place. Do not sit, drive, or stand in one place for more than 30 minutes at a time. Take short walks on level surfaces as soon as pain allows.Try to increase the length of time you walk each day.   Do not stay in bed.Resting more than 1 or 2 days can delay your recovery.   Do not avoid exercise or work.Your body is made to move.It is not dangerous to be active, even though your back may hurt.Your back will likely heal faster if you return to being active before your pain is gone.   Pay attention to your body when you bend and lift. Many people have less discomfortwhen lifting if they bend their knees, keep the load close to their  bodies,and avoid twisting. Often, the most comfortable positions are those that put less stress on your recovering back.   Find a comfortable position to sleep. Use a firm mattress and lie on your side with your knees slightly bent. If you lie on your back, put a pillow under your knees.   Only take over-the-counter or prescription medicines as directed by your caregiver. Over-the-counter medicines to reduce pain and inflammation are often the most helpful.Your caregiver may prescribe muscle relaxant drugs.These medicines help dull your pain so you can more quickly return to your normal activities and healthy exercise.   Put ice on the injured area.   Put ice in a plastic bag.   Place a towel between your skin and the bag.   Leave the ice on for 15 to 20 minutes, 3 to 4 times a day for the first 2 to 3 days. After that, ice and heat may be alternated to reduce pain and spasms.   Ask your caregiver about trying back exercises and gentle massage. This may be of some benefit.   Avoid feeling anxious or stressed.Stress increases muscle tension and can worsen back pain.It is important to recognize when you are anxious or stressed and learn ways to manage it.Exercise is a great option.  SEEK MEDICAL CARE IF:  You have pain that is not relieved with rest or medicine.   You have   pain that does not improve in 1 week.   You have new symptoms.   You are generally not feeling well.  SEEK IMMEDIATE MEDICAL CARE IF:   You have pain that radiates from your back into your legs.   You develop new bowel or bladder control problems.   You have unusual weakness or numbness in your arms or legs.   You develop nausea or vomiting.   You develop abdominal pain.   You feel faint.  Document Released: 09/13/2005 Document Revised: 09/02/2011 Document Reviewed: 02/01/2011 [employer] Patient Information 2012 [employer]. 

## 2017-07-21 ENCOUNTER — Ambulatory Visit (INDEPENDENT_AMBULATORY_CARE_PROVIDER_SITE_OTHER): Payer: Medicare Other | Admitting: Otolaryngology

## 2017-07-21 DIAGNOSIS — H6123 Impacted cerumen, bilateral: Secondary | ICD-10-CM | POA: Diagnosis not present

## 2017-07-21 DIAGNOSIS — H903 Sensorineural hearing loss, bilateral: Secondary | ICD-10-CM

## 2017-07-21 DIAGNOSIS — H608X3 Other otitis externa, bilateral: Secondary | ICD-10-CM | POA: Diagnosis not present

## 2017-10-20 ENCOUNTER — Ambulatory Visit (INDEPENDENT_AMBULATORY_CARE_PROVIDER_SITE_OTHER): Payer: Medicare Other | Admitting: Otolaryngology

## 2017-10-20 DIAGNOSIS — H6123 Impacted cerumen, bilateral: Secondary | ICD-10-CM

## 2018-01-16 ENCOUNTER — Ambulatory Visit (INDEPENDENT_AMBULATORY_CARE_PROVIDER_SITE_OTHER): Payer: Medicare Other | Admitting: Otolaryngology

## 2018-01-16 DIAGNOSIS — H6123 Impacted cerumen, bilateral: Secondary | ICD-10-CM | POA: Diagnosis not present

## 2018-03-27 ENCOUNTER — Ambulatory Visit (INDEPENDENT_AMBULATORY_CARE_PROVIDER_SITE_OTHER): Payer: Medicare Other | Admitting: Otolaryngology

## 2018-03-27 DIAGNOSIS — H6123 Impacted cerumen, bilateral: Secondary | ICD-10-CM

## 2018-04-30 ENCOUNTER — Encounter (HOSPITAL_COMMUNITY): Payer: Self-pay

## 2018-04-30 ENCOUNTER — Observation Stay (HOSPITAL_COMMUNITY)
Admission: EM | Admit: 2018-04-30 | Discharge: 2018-05-02 | Disposition: A | Discharge status: Home / Self Care | Payer: Medicare Other | Attending: Gastroenterology | Admitting: Gastroenterology

## 2018-04-30 DIAGNOSIS — R0602 Shortness of breath: Secondary | ICD-10-CM | POA: Insufficient documentation

## 2018-04-30 DIAGNOSIS — K3189 Other diseases of stomach and duodenum: Secondary | ICD-10-CM | POA: Diagnosis not present

## 2018-04-30 DIAGNOSIS — Z794 Long term (current) use of insulin: Secondary | ICD-10-CM | POA: Diagnosis not present

## 2018-04-30 DIAGNOSIS — D649 Anemia, unspecified: Secondary | ICD-10-CM

## 2018-04-30 DIAGNOSIS — K2950 Unspecified chronic gastritis without bleeding: Secondary | ICD-10-CM | POA: Insufficient documentation

## 2018-04-30 DIAGNOSIS — R195 Other fecal abnormalities: Secondary | ICD-10-CM

## 2018-04-30 DIAGNOSIS — I959 Hypotension, unspecified: Secondary | ICD-10-CM

## 2018-04-30 DIAGNOSIS — Z87891 Personal history of nicotine dependence: Secondary | ICD-10-CM | POA: Diagnosis not present

## 2018-04-30 DIAGNOSIS — K31819 Angiodysplasia of stomach and duodenum without bleeding: Secondary | ICD-10-CM | POA: Diagnosis not present

## 2018-04-30 DIAGNOSIS — K274 Chronic or unspecified peptic ulcer, site unspecified, with hemorrhage: Secondary | ICD-10-CM | POA: Diagnosis present

## 2018-04-30 DIAGNOSIS — F0390 Unspecified dementia without behavioral disturbance: Secondary | ICD-10-CM | POA: Diagnosis not present

## 2018-04-30 DIAGNOSIS — Z7401 Bed confinement status: Secondary | ICD-10-CM | POA: Insufficient documentation

## 2018-04-30 DIAGNOSIS — Z836 Family history of other diseases of the respiratory system: Secondary | ICD-10-CM | POA: Insufficient documentation

## 2018-04-30 DIAGNOSIS — K922 Gastrointestinal hemorrhage, unspecified: Secondary | ICD-10-CM | POA: Diagnosis present

## 2018-04-30 DIAGNOSIS — G510 Bell's palsy: Secondary | ICD-10-CM | POA: Insufficient documentation

## 2018-04-30 DIAGNOSIS — K284 Chronic or unspecified gastrojejunal ulcer with hemorrhage: Secondary | ICD-10-CM | POA: Diagnosis present

## 2018-04-30 DIAGNOSIS — K921 Melena: Secondary | ICD-10-CM | POA: Insufficient documentation

## 2018-04-30 DIAGNOSIS — D509 Iron deficiency anemia, unspecified: Secondary | ICD-10-CM | POA: Diagnosis not present

## 2018-04-30 DIAGNOSIS — D62 Acute posthemorrhagic anemia: Secondary | ICD-10-CM | POA: Diagnosis not present

## 2018-04-30 DIAGNOSIS — I10 Essential (primary) hypertension: Secondary | ICD-10-CM | POA: Diagnosis present

## 2018-04-30 DIAGNOSIS — E785 Hyperlipidemia, unspecified: Secondary | ICD-10-CM | POA: Diagnosis not present

## 2018-04-30 DIAGNOSIS — Z7982 Long term (current) use of aspirin: Secondary | ICD-10-CM | POA: Insufficient documentation

## 2018-04-30 DIAGNOSIS — Z888 Allergy status to other drugs, medicaments and biological substances status: Secondary | ICD-10-CM | POA: Insufficient documentation

## 2018-04-30 DIAGNOSIS — D500 Iron deficiency anemia secondary to blood loss (chronic): Secondary | ICD-10-CM

## 2018-04-30 DIAGNOSIS — Z809 Family history of malignant neoplasm, unspecified: Secondary | ICD-10-CM | POA: Diagnosis not present

## 2018-04-30 DIAGNOSIS — E119 Type 2 diabetes mellitus without complications: Secondary | ICD-10-CM | POA: Diagnosis not present

## 2018-04-30 DIAGNOSIS — Z79899 Other long term (current) drug therapy: Secondary | ICD-10-CM | POA: Insufficient documentation

## 2018-04-30 DIAGNOSIS — E669 Obesity, unspecified: Secondary | ICD-10-CM | POA: Diagnosis not present

## 2018-04-30 DIAGNOSIS — Z885 Allergy status to narcotic agent status: Secondary | ICD-10-CM | POA: Diagnosis not present

## 2018-04-30 DIAGNOSIS — Z8711 Personal history of peptic ulcer disease: Secondary | ICD-10-CM | POA: Diagnosis not present

## 2018-04-30 LAB — CBC WITH DIFFERENTIAL/PLATELET
BASOS ABS: 0 10*3/uL (ref 0.0–0.1)
Basophils Relative: 1 %
EOS ABS: 0.2 10*3/uL (ref 0.0–0.7)
Eosinophils Relative: 4 %
HCT: 23.2 % — ABNORMAL LOW (ref 39.0–52.0)
HEMOGLOBIN: 6.4 g/dL — AB (ref 13.0–17.0)
LYMPHS PCT: 34 %
Lymphs Abs: 1.4 10*3/uL (ref 0.7–4.0)
MCH: 19.5 pg — ABNORMAL LOW (ref 26.0–34.0)
MCHC: 27.6 g/dL — ABNORMAL LOW (ref 30.0–36.0)
MCV: 70.5 fL — ABNORMAL LOW (ref 78.0–100.0)
Monocytes Absolute: 0.6 10*3/uL (ref 0.1–1.0)
Monocytes Relative: 14 %
NEUTROS PCT: 47 %
Neutro Abs: 1.9 10*3/uL (ref 1.7–7.7)
Platelets: 217 10*3/uL (ref 150–400)
RBC: 3.29 MIL/uL — AB (ref 4.22–5.81)
RDW: 16 % — ABNORMAL HIGH (ref 11.5–15.5)
WBC: 4.1 10*3/uL (ref 4.0–10.5)

## 2018-04-30 LAB — BASIC METABOLIC PANEL
ANION GAP: 5 (ref 5–15)
BUN: 15 mg/dL (ref 8–23)
CALCIUM: 8.7 mg/dL — AB (ref 8.9–10.3)
CHLORIDE: 103 mmol/L (ref 98–111)
CO2: 30 mmol/L (ref 22–32)
Creatinine, Ser: 0.86 mg/dL (ref 0.61–1.24)
GFR calc non Af Amer: >60 mL/min (ref >60)
Glucose, Bld: 130 mg/dL — ABNORMAL HIGH (ref 70–99)
Potassium: 3.6 mmol/L (ref 3.5–5.1)
SODIUM: 138 mmol/L (ref 135–145)

## 2018-04-30 LAB — PROTIME-INR
INR: 1.05
PROTHROMBIN TIME: 13.6 s (ref 11.4–15.2)

## 2018-04-30 LAB — GLUCOSE, CAPILLARY: GLUCOSE-CAPILLARY: 122 mg/dL — AB (ref 70–99)

## 2018-04-30 LAB — IRON AND TIBC
IRON: 11 ug/dL — AB (ref 45–182)
Saturation Ratios: 3 % — ABNORMAL LOW (ref 17.9–39.5)
TIBC: 426 ug/dL (ref 250–450)
UIBC: 415 ug/dL

## 2018-04-30 LAB — LACTIC ACID, PLASMA
LACTIC ACID, VENOUS: 1.4 mmol/L (ref 0.5–1.9)
LACTIC ACID, VENOUS: 1.3 mmol/L (ref 0.5–1.9)

## 2018-04-30 LAB — PREPARE RBC (CROSSMATCH)

## 2018-04-30 LAB — RETICULOCYTES
RBC.: 3.29 MIL/uL — AB (ref 4.22–5.81)
RETIC COUNT ABSOLUTE: 65.8 10*3/uL (ref 19.0–186.0)
Retic Ct Pct: 2 % (ref 0.4–3.1)

## 2018-04-30 LAB — POC OCCULT BLOOD, ED: FECAL OCCULT BLD: POSITIVE — AB

## 2018-04-30 LAB — VITAMIN B12: VITAMIN B 12: 681 pg/mL (ref 180–914)

## 2018-04-30 LAB — FERRITIN: Ferritin: 3 ng/mL — ABNORMAL LOW (ref 24–336)

## 2018-04-30 LAB — FOLATE: FOLATE: 11.1 ng/mL (ref >5.9)

## 2018-04-30 MED ORDER — ATENOLOL 25 MG PO TABS
50.0000 mg | ORAL_TABLET | Freq: Every day | ORAL | Status: DC
  Administered 2018-04-30: 50 mg via ORAL
  Filled 2018-04-30 (×2): qty 2

## 2018-04-30 MED ORDER — MAGNESIUM HYDROXIDE 400 MG/5ML PO SUSP
30.0000 mL | Freq: Every day | ORAL | Status: DC | PRN
Start: 2018-04-30 — End: 2018-05-02

## 2018-04-30 MED ORDER — IPRATROPIUM-ALBUTEROL 0.5-2.5 (3) MG/3ML IN SOLN: 3.0000 mL | Freq: Three times a day (TID) | RESPIRATORY_TRACT | Status: DC | PRN

## 2018-04-30 MED ORDER — PANTOPRAZOLE SODIUM 40 MG IV SOLR
40.0000 mg | Freq: Once | INTRAVENOUS | Status: AC
  Administered 2018-04-30: 40 mg via INTRAVENOUS
  Filled 2018-04-30: qty 40

## 2018-04-30 MED ORDER — SODIUM CHLORIDE 0.9 % IV SOLN
Freq: Once | INTRAVENOUS | Status: AC
Start: 2018-04-30 — End: 2018-05-01
  Administered 2018-04-30: 20:00:00 via INTRAVENOUS

## 2018-04-30 MED ORDER — FAMOTIDINE IN NACL 20-0.9 MG/50ML-% IV SOLN
20.0000 mg | Freq: Two times a day (BID) | INTRAVENOUS | Status: DC
Start: 2018-04-30 — End: 2018-05-01
  Administered 2018-04-30 – 2018-05-01 (×2): 20 mg via INTRAVENOUS
  Filled 2018-04-30 (×2): qty 50

## 2018-04-30 MED ORDER — POLYVINYL ALCOHOL 1.4 % OP SOLN
1.0000 [drp] | Freq: Three times a day (TID) | OPHTHALMIC | Status: DC
  Administered 2018-04-30 – 2018-05-02 (×5): 1 [drp] via OPHTHALMIC
  Filled 2018-04-30 (×2): qty 15

## 2018-04-30 MED ORDER — POTASSIUM CHLORIDE CRYS ER 20 MEQ PO TBCR
20.0000 meq | EXTENDED_RELEASE_TABLET | Freq: Two times a day (BID) | ORAL | Status: DC
  Administered 2018-04-30 – 2018-05-02 (×4): 20 meq via ORAL
  Filled 2018-04-30 (×4): qty 1

## 2018-04-30 MED ORDER — KETOCONAZOLE 2 % EX SHAM
1.0000 "application " | MEDICATED_SHAMPOO | Freq: Every day | CUTANEOUS | Status: DC | PRN
  Filled 2018-04-30: qty 120

## 2018-04-30 MED ORDER — SODIUM CHLORIDE 0.9 % IV SOLN: 10.0000 mL/h | Freq: Once | INTRAVENOUS | Status: DC

## 2018-04-30 MED ORDER — SODIUM CHLORIDE 0.9% FLUSH: 3.0000 mL | INTRAVENOUS | Status: DC | PRN

## 2018-04-30 MED ORDER — SODIUM CHLORIDE 0.9 % IV SOLN
250.0000 mL | INTRAVENOUS | Status: DC | PRN
Start: 2018-04-30 — End: 2018-05-02

## 2018-04-30 MED ORDER — SODIUM CHLORIDE 0.9% FLUSH
3.0000 mL | Freq: Two times a day (BID) | INTRAVENOUS | Status: DC
  Administered 2018-04-30: 3 mL via INTRAVENOUS

## 2018-04-30 MED ORDER — BENAZEPRIL HCL 10 MG PO TABS
10.0000 mg | ORAL_TABLET | Freq: Every day | ORAL | Status: DC
  Filled 2018-04-30: qty 1

## 2018-04-30 MED ORDER — FAMOTIDINE 20 MG PO TABS: 20.0000 mg | ORAL_TABLET | Freq: Every evening | ORAL | Status: DC

## 2018-04-30 MED ORDER — VITAMIN B-12 1000 MCG PO TABS
1000.0000 ug | ORAL_TABLET | Freq: Every day | ORAL | Status: DC
  Administered 2018-05-01 – 2018-05-02 (×2): 1000 ug via ORAL
  Filled 2018-04-30 (×2): qty 1

## 2018-04-30 MED ORDER — ATORVASTATIN CALCIUM 10 MG PO TABS
10.0000 mg | ORAL_TABLET | Freq: Every day | ORAL | Status: DC
  Administered 2018-04-30 – 2018-05-01 (×2): 10 mg via ORAL
  Filled 2018-04-30 (×2): qty 1

## 2018-04-30 MED ORDER — FUROSEMIDE 40 MG PO TABS
40.0000 mg | ORAL_TABLET | Freq: Every day | ORAL | Status: DC
  Administered 2018-05-01 – 2018-05-02 (×2): 40 mg via ORAL
  Filled 2018-04-30 (×2): qty 1

## 2018-04-30 MED ORDER — INSULIN ASPART 100 UNIT/ML ~~LOC~~ SOLN
0.0000 [IU] | Freq: Three times a day (TID) | SUBCUTANEOUS | Status: DC
Start: 2018-05-01 — End: 2018-05-02

## 2018-04-30 MED ORDER — SODIUM CHLORIDE 0.9 % IV BOLUS
1000.0000 mL | Freq: Once | INTRAVENOUS | Status: AC
  Administered 2018-04-30: 1000 mL via INTRAVENOUS

## 2018-04-30 NOTE — ED Triage Notes (Signed)
Pt arrived via [facility], reports low hemoglobin and pt needs blood transfusion. Per nurse practitioner at [facility], pt has had gradual decrease in hemoglobin since April of unknown origin.

## 2018-04-30 NOTE — ED Provider Notes (Signed)
[facility] EMERGENCY DEPARTMENT Provider Note   CSN: [id number] Arrival date & time: 04/30/18  1432     History   Chief Complaint Chief Complaint  Patient presents with  . Anemia    HPI [name] is a 72 y.o. male.  72 year old male resident at long-term care facility here for evaluation of progressive anemia.  Patient is a very poor historian level 5 caveat secondary to fund of information.  He states he is here because his hemoglobin is low.  He denies any obvious signs of bleeding.  On reviewing his medical notes he has had a prior history of gastric ulcer.  He denies any headache chest pain shortness of breath abdominal pain.  No syncope.  At baseline he is bedbound or wheelchair with a lift.   The history is provided by the patient.    Past Medical History:  Diagnosis Date  . Bell's palsy   . Bleeding ulcer    remote past  . Dementia    disoriented to time.does not know medical hx.information obtained from previous charts  . Hyperlipemia   . Hypertension   . Obesity   . SOB (shortness of breath) on exertion     Patient Active Problem List   Diagnosis Date Noted  . Encounter for screening colonoscopy 10/08/2011  . CLOSED DISLOCATION OF FOOT UNSPECIFIED PART 01/15/2010    Past Surgical History:  Procedure Laterality Date  . COLONOSCOPY  10/06/06   internal hemorrhoids and anal papilla/poor prep  . FOOT SURGERY    . KNEE ARTHROSCOPY  06/17/2011   Procedure: ARTHROSCOPY KNEE;  Surgeon: Sanjuana Kava;  Location: [facility];  Service: Orthopedics;  Laterality: Right;        Home Medications    Prior to Admission medications   Medication Sig Start Date End Date Taking? Authorizing Provider  acetaminophen (QC ARTHRITIS PAIN RELIEF) 650 MG CR tablet Take 650 mg by mouth 2 (two) times daily.      [provider]  aspirin EC 325 MG tablet Take 325 mg by mouth daily.     [provider]  atenolol (TENORMIN) 50 MG tablet Take 50 mg by mouth daily.       [provider]  atorvastatin (LIPITOR) 10 MG tablet Take 10 mg by mouth daily.    [provider]  benazepril (LOTENSIN) 10 MG tablet Take 30 mg by mouth daily.      [provider]  cetirizine (ZYRTEC) 10 MG tablet Take 10 mg by mouth daily.      [provider]  CYANOCOBALAMIN IJ Inject 1 mL as directed.    [provider]  furosemide (LASIX) 40 MG tablet Take 40 mg by mouth 2 (two) times daily.      [provider]  magnesium hydroxide (MILK OF MAGNESIA) 400 MG/5ML suspension Take 30 mLs by mouth daily as needed. For constipation     [provider]  niacin (NIASPAN) 1000 MG CR tablet Take 1,000 mg by mouth at bedtime.     [provider]  potassium chloride SA (K-DUR,KLOR-CON) 20 MEQ tablet Take 40 mEq by mouth 2 (two) times daily.      [provider]  ranitidine (ZANTAC) 150 MG tablet Take 150 mg by mouth daily.      [provider]  simvastatin (ZOCOR) 20 MG tablet Take 20 mg by mouth daily at 8 pm.      [provider]  traMADol (ULTRAM) 50 MG tablet Take 50  mg by mouth every 6 (six) hours as needed. For pain Maximum dose= 8 tablets per day     [provider]  vitamin C (ASCORBIC ACID) 500 MG tablet Take 500 mg by mouth daily.      [provider]    Family History Family History  Problem Relation Age of Onset  . COPD Mother   . Cancer Brother   . Anesthesia problems Neg Hx   . Hypotension Neg Hx   . Malignant hyperthermia Neg Hx   . Pseudochol deficiency Neg Hx   . Colon cancer Neg Hx     Social History Social History   Tobacco Use  . Smoking status: Former Smoker    Packs/day: 1.00    Years: 30.00    Pack years: 30.00    Types: Cigarettes  . Tobacco comment: quit a "long time ago"   Substance Use Topics  . Alcohol use: No  . Drug use: No     Allergies   Hydrocodone-acetaminophen   Review of Systems Review of Systems  Constitutional:  Negative for activity change, appetite change and fever.  HENT: Negative for sore throat.   Eyes: Negative for visual disturbance.  Respiratory: Negative for shortness of breath.   Cardiovascular: Negative for chest pain.  Gastrointestinal: Negative for abdominal pain.  Genitourinary: Negative for dysuria.  Musculoskeletal: Positive for gait problem.  Skin: Negative for rash.  Neurological: Negative for syncope and headaches.     Physical Exam Updated Vital Signs BP (!) 87/35 (BP Location: Left Arm)   Pulse 84   Temp (!) 97.5 F (36.4 C) (Oral)   Resp 13   SpO2 99%   Physical Exam  Constitutional: He appears well-developed and well-nourished.  HENT:  Head: Normocephalic and atraumatic.  Eyes: Pupils are equal, round, and reactive to light. Conjunctivae are normal.  Neck: Neck supple.  Cardiovascular: Normal rate, regular rhythm and normal heart sounds.  Pulmonary/Chest: Effort normal and breath sounds normal. He has no wheezes. He has no rales.  Abdominal: He exhibits no mass. There is no tenderness. There is no guarding.  Musculoskeletal: He exhibits edema (bilat le edema, symmetric).  Neurological: He is alert. GCS eye subscore is 4. GCS verbal subscore is 5. GCS motor subscore is 6.  Patient has some chronic edema and muscle wasting of his lower extremities.  He is unable to lift them off the bed against gravity.  He is able to raise his arms up.  Unable to test gait.  Skin: Skin is warm and dry.  Psychiatric: He has a normal mood and affect.  Nursing note and vitals reviewed.    ED Treatments / Results  Labs (all labs ordered are listed, but only abnormal results are displayed) Labs Reviewed  MRSA PCR SCREENING - Abnormal; Notable for the following components:      Result Value   MRSA by PCR POSITIVE (*)    All other components within normal limits  IRON AND TIBC - Abnormal; Notable for the following components:   Iron 11 (*)    Saturation Ratios 3 (*)    All  other components within normal limits  FERRITIN - Abnormal; Notable for the following components:   Ferritin 3 (*)    All other components within normal limits  RETICULOCYTES - Abnormal; Notable for the following components:   RBC. 3.29 (*)    All other components within normal limits  CBC WITH DIFFERENTIAL/PLATELET - Abnormal; Notable for the following components:   RBC 3.29 (*)  Hemoglobin 6.4 (*)    HCT 23.2 (*)    MCV 70.5 (*)    MCH 19.5 (*)    MCHC 27.6 (*)    RDW 16.0 (*)    All other components within normal limits  BASIC METABOLIC PANEL - Abnormal; Notable for the following components:   Glucose, Bld 130 (*)    Calcium 8.7 (*)    All other components within normal limits  CBC - Abnormal; Notable for the following components:   RBC 3.88 (*)    Hemoglobin 8.3 (*)    HCT 28.9 (*)    MCV 74.5 (*)    MCH 21.4 (*)    MCHC 28.7 (*)    RDW 18.1 (*)    All other components within normal limits  BASIC METABOLIC PANEL - Abnormal; Notable for the following components:   Glucose, Bld 101 (*)    Calcium 8.4 (*)    All other components within normal limits  GLUCOSE, CAPILLARY - Abnormal; Notable for the following components:   Glucose-Capillary 122 (*)    All other components within normal limits  POC OCCULT BLOOD, ED - Abnormal; Notable for the following components:   Fecal Occult Bld POSITIVE (*)    All other components within normal limits  VITAMIN B12  FOLATE  PROTIME-INR  LACTIC ACID, PLASMA  LACTIC ACID, PLASMA  TYPE AND SCREEN  PREPARE RBC (CROSSMATCH)    EKG EKG Interpretation  Date/Time:  Sunday April 30 2018 14:38:26 EDT Ventricular Rate:  84 PR Interval:    QRS Duration: 118 QT Interval:  420 QTC Calculation: 497 R Axis:   -44 Text Interpretation:  Sinus rhythm Nonspecific IVCD with LAD Artifact in lead(s) I II III aVR aVL aVF V1 V2 similar pattern to prior 9/12 Confirmed by Aletta Edouard [phone]) on 04/30/2018 3:26:30 PM   Radiology No results  found.  Procedures .Critical Care Performed by: Hayden Rasmussen, MD Authorized by: Hayden Rasmussen, MD   Critical care provider statement:    Critical care time (minutes):  30   Critical care was necessary to treat or prevent imminent or life-threatening deterioration of the following conditions:  Circulatory failure   Critical care was time spent personally by me on the following activities:  Discussions with consultants, evaluation of patient's response to treatment, examination of patient, ordering and performing treatments and interventions, ordering and review of laboratory studies, ordering and review of radiographic studies, pulse oximetry, re-evaluation of patient's condition, obtaining history from patient or surrogate, review of old charts and development of treatment plan with patient or surrogate   I assumed direction of critical care for this patient from another provider in my specialty: no     (including critical care time)  Medications Ordered in ED Medications - No data to display   Initial Impression / Assessment and Plan / ED Course  I have reviewed the triage vital signs and the nursing notes.  Pertinent labs & imaging results that were available during my care of the patient were reviewed by me and considered in my medical decision making (see chart for details).  Clinical Course as of May 02 1115  Nancy Fetter Apr 30, 2938  1211 72 year old male here for evaluation of anemia possibly requiring transfusion.  Patient himself and is very little about why is here and there is no information in epic.  Per the triage note he is here to be transfused and this is been a slow process over months.  Patient's blood pressure here  is quite low at 87/35 but he is alert and denies any complaints.   [MB]  8403 Patient's hemoglobin here is 6.4.  With his comorbidities I feel is probably necessary to transfuse him.   [MB]  7543 The patient has a legal guardian and the charge nurse has  contacted them and is gotten consent for transfusion and admission as needed.   [MB]  1611 Rectal done with chaperone.  He has some erythema around his buttocks.  Brown soft stool in vault.  Guaiac pending.   [MB]  6067 Discussed with Dr. Shanon Brow from hospitalist service.  She is asking if we can let GI know about this patient and wants him to get some IV fluids until his blood is available so we can get his blood pressure up.   [MB]  1628 Discussed with Dr. Oneida Alar from GI who has no specific recommendations at present.   [MB]  2 Was informed by the nurse that the patient has antibodies and will be a difficult crossmatch and the anticipate blood will not be available till tomorrow.   [MB]    Clinical Course User Index [MB] Hayden Rasmussen, MD     Final Clinical Impressions(s) / ED Diagnoses   Final diagnoses:  Anemia, unspecified type  Guaiac positive stools  Hypotension, unspecified hypotension type    ED Discharge Orders    None       Hayden Rasmussen, MD 05/01/18 1121

## 2018-04-30 NOTE — H&P (Signed)
History and Physical    [name] FVC:[id number] DOB: January 06, 1946 DOA: 04/30/2018  PCP: Rosita Fire, MD  Patient coming from: Nursing home  Chief Complaint: Abnormal lab and needs blood transfusion  HPI: [name] is a 72 y.o. male with medical history significant of dementia, peptic ulcer disease, hypertension sent in from his nursing home for hemoglobin of 6 and the need for blood transfusion.  No report of overt bleeding.  Pt deneis any brbpr or n/v.  No abd pain.  No weakness.  Pt is heme pos in ED with brown stool.  Review of Systems: Unobtainable due to dementia  Past Medical History:  Diagnosis Date  . Bell's palsy   . Bleeding ulcer    remote past  . Dementia    disoriented to time.does not know medical hx.information obtained from previous charts  . Hyperlipemia   . Hypertension   . Obesity   . SOB (shortness of breath) on exertion     Past Surgical History:  Procedure Laterality Date  . COLONOSCOPY  10/06/06   internal hemorrhoids and anal papilla/poor prep  . FOOT SURGERY    . KNEE ARTHROSCOPY  06/17/2011   Procedure: ARTHROSCOPY KNEE;  Surgeon: Sanjuana Kava;  Location: [facility];  Service: Orthopedics;  Laterality: Right;     reports that he has quit smoking. His smoking use included cigarettes. He has a 30.00 pack-year smoking history. He does not have any smokeless tobacco history on file. He reports that he does not drink alcohol or use drugs.  Allergies  Allergen Reactions  . Hydrocodone-Acetaminophen     unknown    Family History  Problem Relation Age of Onset  . COPD Mother   . Cancer Brother   . Anesthesia problems Neg Hx   . Hypotension Neg Hx   . Malignant hyperthermia Neg Hx   . Pseudochol deficiency Neg Hx   . Colon cancer Neg Hx     Prior to Admission medications   Medication Sig Start Date End Date Taking? Authorizing Provider  acetaminophen (QC ARTHRITIS PAIN RELIEF) 650 MG CR tablet Take 650 mg by mouth every 8 (eight) hours as  needed for pain.    Yes [provider]  aspirin EC 81 MG tablet Take 81 mg by mouth daily.    Yes [provider]  atenolol (TENORMIN) 50 MG tablet Take 50 mg by mouth daily.     Yes [provider]  atorvastatin (LIPITOR) 10 MG tablet Take 10 mg by mouth at bedtime.    Yes [provider]  benazepril (LOTENSIN) 10 MG tablet Take 10 mg by mouth daily.    Yes [provider]  carboxymethylcellulose (REFRESH TEARS) 0.5 % SOLN Apply 1 drop to eye 3 (three) times daily.   Yes [provider]  famotidine (PEPCID) 20 MG tablet Take 20 mg by mouth every evening.   Yes [provider]  furosemide (LASIX) 40 MG tablet Take 40 mg by mouth daily.    Yes [provider]  ipratropium-albuterol (DUONEB) 0.5-2.5 (3) MG/3ML SOLN Take 3 mLs by nebulization every 8 (eight) hours as needed (for shortness of breath).   Yes [provider]  ketoconazole (NIZORAL) 2 % shampoo Apply 1 application topically daily as needed for irritation (to scalp).   Yes [provider]  metFORMIN (GLUCOPHAGE) 500 MG tablet Take 500 mg by mouth at bedtime.   Yes [provider]  mometasone (ELOCON) 0.1 % cream Apply 1 application topically daily  as needed (for dry skin).   Yes [provider]  nystatin-triamcinolone (MYCOLOG II) cream Apply 1 application topically daily as needed (for rash).   Yes [provider]  potassium chloride SA (K-DUR,KLOR-CON) 20 MEQ tablet Take 20 mEq by mouth 2 (two) times daily.    Yes [provider]  vitamin B-12 (CYANOCOBALAMIN) 1000 MCG tablet Take 1,000 mcg by mouth daily.   Yes [provider]  magnesium hydroxide (MILK OF MAGNESIA) 400 MG/5ML suspension Take 30 mLs by mouth daily as needed for mild constipation or moderate constipation. For constipation     [provider]    Physical Exam: Vitals:   04/30/18 1700 04/30/18 1845 04/30/18 1900 04/30/18 1930    BP: (!) 120/55 (!) 128/95  112/73  Pulse: 82 91    Resp: 14 (!) 24  (!) 21  Temp:      TempSrc:      SpO2: 95% 95% 95%       Constitutional: NAD, calm, comfortable Vitals:   04/30/18 1700 04/30/18 1845 04/30/18 1900 04/30/18 1930  BP: (!) 120/55 (!) 128/95  112/73  Pulse: 82 91    Resp: 14 (!) 24  (!) 21  Temp:      TempSrc:      SpO2: 95% 95% 95%    Eyes: PERRL, lids and conjunctivae normal ENMT: Mucous membranes are moist. Posterior pharynx clear of any exudate or lesions.Normal dentition.  Neck: normal, supple, no masses, no thyromegaly Respiratory: clear to auscultation bilaterally, no wheezing, no crackles. Normal respiratory effort. No accessory muscle use.  Cardiovascular: Regular rate and rhythm, no murmurs / rubs / gallops. No extremity edema. 2+ pedal pulses. No carotid bruits.  Abdomen: no tenderness, no masses palpated. No hepatosplenomegaly. Bowel sounds positive.  Musculoskeletal: no clubbing / cyanosis. No joint deformity upper and lower extremities. Good ROM, no contractures. Normal muscle tone.  Skin: no rashes, lesions, ulcers. No induration Neurologic: CN 2-12 grossly intact. Sensation intact, DTR normal. Strength 5/5 in all 4.  Psychiatric: Normal judgment and insight. Alert and oriented x 2. Normal mood.    Labs on Admission: I have personally reviewed following labs and imaging studies  CBC: Recent Labs  Lab 04/30/18 1513  WBC 4.1  NEUTROABS 1.9  HGB 6.4*  HCT 23.2*  MCV 70.5*  PLT 469   Basic Metabolic Panel: Recent Labs  Lab 04/30/18 1513  NA 138  K 3.6  CL 103  CO2 30  GLUCOSE 130*  BUN 15  CREATININE 0.86  CALCIUM 8.7*   GFR: CrCl cannot be calculated (Unknown ideal weight.). Liver Function Tests: No results for input(s): AST, ALT, ALKPHOS, BILITOT, PROT, ALBUMIN in the last 168 hours. No results for input(s): LIPASE, AMYLASE in the last 168 hours. No results for input(s): AMMONIA in the last 168 hours. Coagulation  Profile: Recent Labs  Lab 04/30/18 1513  INR 1.05   Cardiac Enzymes: No results for input(s): CKTOTAL, CKMB, CKMBINDEX, TROPONINI in the last 168 hours. BNP (last 3 results) No results for input(s): PROBNP in the last 8760 hours. HbA1C: No results for input(s): HGBA1C in the last 72 hours. CBG: No results for input(s): GLUCAP in the last 168 hours. Lipid Profile: No results for input(s): CHOL, HDL, LDLCALC, TRIG, CHOLHDL, LDLDIRECT in the last 72 hours. Thyroid Function Tests: No results for input(s): TSH, T4TOTAL, FREET4, T3FREE, THYROIDAB in the last 72 hours. Anemia Panel: Recent Labs    04/30/18 1513  RETICCTPCT 2.0   Urine analysis:  Component Value Date/Time   COLORURINE YELLOW 05/27/2010 2038   APPEARANCEUR CLEAR 05/27/2010 2038   LABSPEC 1.015 05/27/2010 2038   PHURINE 5.0 05/27/2010 2038   GLUCOSEU NEGATIVE 05/27/2010 2038   HGBUR TRACE (A) 05/27/2010 2038   BILIRUBINUR NEGATIVE 05/27/2010 2038   [place] NEGATIVE 05/27/2010 2038   PROTEINUR NEGATIVE 05/27/2010 2038   UROBILINOGEN 0.2 05/27/2010 2038   NITRITE NEGATIVE 05/27/2010 2038   LEUKOCYTESUR NEGATIVE 05/27/2010 2038   Sepsis Labs: !!!!!!!!!!!!!!!!!!!!!!!!!!!!!!!!!!!!!!!!!!!! @LABRCNTIP (procalcitonin:4,lacticidven:4) )No results found for this or any previous visit (from the past 240 hour(s)).   Radiological Exams on Admission: No results found.  Old chart reviewed Case discussed with Dr. Melina Copa in the ED  Assessment/Plan 72 yo male with acute on chronic anemia  Principal Problem:   GIB (gastrointestinal bleeding)- transfuse 2 units.  Ck anemia panel.  No overt bleeding.  PPI.  GI consulted.  Active Problems:   Bleeding ulcer- PPI   Hypertension- hold meds due to soft bp in ED resolved now   Mental retardation??-    DVT prophylaxis: SCDs Code Status: Full Family Communication: None Disposition Plan: Pending GI evaluation Consults called: GI Admission status:  Observation   DAVID,RACHAL A MD [facility]  If 7PM-7AM, please contact night-coverage [email or web address] Password [email or web address]  04/30/2018, 8:07 PM

## 2018-04-30 NOTE — ED Notes (Signed)
Verbal consent for blood obtained from Glennie Hawk, [place] supervisor. Witnessed by myself and R. Publishing rights manager

## 2018-04-30 NOTE — ED Notes (Signed)
CRITICAL VALUE ALERT  Critical Value:  Hgb 6.4  Date & Time Notied: 04/30/18 1535  Provider Notified: Dr Melina Copa   Orders Received/Actions taken: orders to be given

## 2018-04-30 NOTE — ED Notes (Signed)
Pt given meal tray for dinner

## 2018-04-30 NOTE — Progress Notes (Signed)
UNMATCHED BLOOD PRODUCT NOTE  Compare the patient ID on the blood tag to the patient ID on the hospital armband and Blood Bank armband. Then confirm the unit number on the blood tag matches the unit number on the blood product.  If a discrepancy is discovered return the product to blood bank immediately.   Blood Product Type: Packed Red Blood Cells  Unit #: (Found on blood product bag, begins with W) [id number]  Product Code #: (Found on blood product bag, begins with E) [id number]   Start Time: 2350  Starting Rate: 120 ml/hr  Rate increase/decreased  (if applicable): 675 ml/hr  Rate changed time (if applicable):    Stop Time: 05/01/18 @ 0250  Blood, blood identification tag and patient verified by Saralyn Pilar, RN and Efrain Sella, RN  All Other Documentation should be documented within the Blood Admin Flowsheet per policy.

## 2018-05-01 ENCOUNTER — Encounter (HOSPITAL_COMMUNITY): Payer: Self-pay

## 2018-05-01 ENCOUNTER — Encounter (HOSPITAL_COMMUNITY)
Admission: EM | Disposition: A | Discharge status: Still In Hospital | Payer: Self-pay | Source: Home / Self Care | Attending: Emergency Medicine

## 2018-05-01 ENCOUNTER — Other Ambulatory Visit: Payer: Self-pay

## 2018-05-01 DIAGNOSIS — R195 Other fecal abnormalities: Secondary | ICD-10-CM | POA: Diagnosis not present

## 2018-05-01 DIAGNOSIS — I10 Essential (primary) hypertension: Secondary | ICD-10-CM

## 2018-05-01 DIAGNOSIS — D62 Acute posthemorrhagic anemia: Secondary | ICD-10-CM

## 2018-05-01 DIAGNOSIS — K2951 Unspecified chronic gastritis with bleeding: Secondary | ICD-10-CM

## 2018-05-01 DIAGNOSIS — D500 Iron deficiency anemia secondary to blood loss (chronic): Secondary | ICD-10-CM | POA: Diagnosis not present

## 2018-05-01 HISTORY — PX: ESOPHAGOGASTRODUODENOSCOPY: SHX5428

## 2018-05-01 LAB — GLUCOSE, CAPILLARY
GLUCOSE-CAPILLARY: 79 mg/dL (ref 70–99)
GLUCOSE-CAPILLARY: 103 mg/dL — AB (ref 70–99)
Glucose-Capillary: 90 mg/dL (ref 70–99)
Glucose-Capillary: 87 mg/dL (ref 70–99)

## 2018-05-01 LAB — BASIC METABOLIC PANEL
Anion gap: 6 (ref 5–15)
BUN: 16 mg/dL (ref 8–23)
CO2: 30 mmol/L (ref 22–32)
Calcium: 8.4 mg/dL — ABNORMAL LOW (ref 8.9–10.3)
Chloride: 102 mmol/L (ref 98–111)
Creatinine, Ser: 0.81 mg/dL (ref 0.61–1.24)
GFR calc Af Amer: >60 mL/min (ref >60)
GFR calc non Af Amer: >60 mL/min (ref >60)
Glucose, Bld: 101 mg/dL — ABNORMAL HIGH (ref 70–99)
Potassium: 4 mmol/L (ref 3.5–5.1)
Sodium: 138 mmol/L (ref 135–145)

## 2018-05-01 LAB — CBC
HCT: 28.9 % — ABNORMAL LOW (ref 39.0–52.0)
Hemoglobin: 8.3 g/dL — ABNORMAL LOW (ref 13.0–17.0)
MCH: 21.4 pg — ABNORMAL LOW (ref 26.0–34.0)
MCHC: 28.7 g/dL — ABNORMAL LOW (ref 30.0–36.0)
MCV: 74.5 fL — ABNORMAL LOW (ref 78.0–100.0)
Platelets: 180 10*3/uL (ref 150–400)
RBC: 3.88 MIL/uL — ABNORMAL LOW (ref 4.22–5.81)
RDW: 18.1 % — ABNORMAL HIGH (ref 11.5–15.5)
WBC: 5.7 10*3/uL (ref 4.0–10.5)

## 2018-05-01 LAB — MRSA PCR SCREENING: MRSA by PCR: POSITIVE — AB

## 2018-05-01 SURGERY — EGD (ESOPHAGOGASTRODUODENOSCOPY)
Anesthesia: Moderate Sedation

## 2018-05-01 MED ORDER — MEPERIDINE HCL 100 MG/ML IJ SOLN
INTRAMUSCULAR | Status: DC | PRN
  Administered 2018-05-01 (×2): 25 mg via INTRAVENOUS

## 2018-05-01 MED ORDER — STERILE WATER FOR IRRIGATION IR SOLN
Status: DC | PRN
  Administered 2018-05-01: 2.5 mL

## 2018-05-01 MED ORDER — MEPERIDINE HCL 100 MG/ML IJ SOLN
INTRAMUSCULAR | Status: AC
  Filled 2018-05-01: qty 2

## 2018-05-01 MED ORDER — LIDOCAINE VISCOUS HCL 2 % MT SOLN
OROMUCOSAL | Status: AC
  Filled 2018-05-01: qty 15

## 2018-05-01 MED ORDER — MIDAZOLAM HCL 5 MG/5ML IJ SOLN
INTRAMUSCULAR | Status: AC
  Filled 2018-05-01: qty 10

## 2018-05-01 MED ORDER — ATENOLOL 25 MG PO TABS
25.0000 mg | ORAL_TABLET | Freq: Every day | ORAL | Status: DC
  Administered 2018-05-01 – 2018-05-02 (×2): 25 mg via ORAL
  Filled 2018-05-01: qty 1

## 2018-05-01 MED ORDER — SODIUM CHLORIDE 0.9 % IV SOLN: INTRAVENOUS | Status: DC

## 2018-05-01 MED ORDER — CHLORHEXIDINE GLUCONATE CLOTH 2 % EX PADS
6.0000 | MEDICATED_PAD | Freq: Every day | CUTANEOUS | Status: DC
  Administered 2018-05-01 – 2018-05-02 (×2): 6 via TOPICAL

## 2018-05-01 MED ORDER — ONDANSETRON HCL 4 MG/2ML IJ SOLN: 4.0000 mg | Freq: Four times a day (QID) | INTRAMUSCULAR | Status: DC | PRN

## 2018-05-01 MED ORDER — PANTOPRAZOLE SODIUM 40 MG PO TBEC
40.0000 mg | DELAYED_RELEASE_TABLET | Freq: Two times a day (BID) | ORAL | Status: DC
  Administered 2018-05-01 – 2018-05-02 (×2): 40 mg via ORAL
  Filled 2018-05-01 (×2): qty 1

## 2018-05-01 MED ORDER — PANTOPRAZOLE SODIUM 40 MG PO TBEC: 40.0000 mg | DELAYED_RELEASE_TABLET | Freq: Two times a day (BID) | ORAL | Status: DC

## 2018-05-01 MED ORDER — LIDOCAINE VISCOUS HCL 2 % MT SOLN
OROMUCOSAL | Status: DC | PRN
  Administered 2018-05-01: 1 via OROMUCOSAL

## 2018-05-01 MED ORDER — MUPIROCIN 2 % EX OINT
1.0000 "application " | TOPICAL_OINTMENT | Freq: Two times a day (BID) | CUTANEOUS | Status: DC
  Administered 2018-05-01 – 2018-05-02 (×4): 1 via NASAL
  Filled 2018-05-01: qty 22

## 2018-05-01 MED ORDER — SODIUM CHLORIDE 0.9 % IV SOLN
510.0000 mg | Freq: Once | INTRAVENOUS | Status: AC
  Administered 2018-05-01: 510 mg via INTRAVENOUS
  Filled 2018-05-01: qty 17

## 2018-05-01 MED ORDER — ACETAMINOPHEN 325 MG PO TABS: 650.0000 mg | ORAL_TABLET | Freq: Four times a day (QID) | ORAL | Status: DC | PRN

## 2018-05-01 MED ORDER — MIDAZOLAM HCL 5 MG/5ML IJ SOLN
INTRAMUSCULAR | Status: DC | PRN
  Administered 2018-05-01 (×3): 1 mg via INTRAVENOUS

## 2018-05-01 NOTE — H&P (View-Only) (Signed)
Referring Provider: Dr. Carles Collet  Primary Care Physician:  Rosita Fire, MD Primary Gastroenterologist:  Dr. Gala Romney   Date of Admission: 04/30/18 Date of Consultation: 05/01/18  Reason for Consultation:  Heme positive stool, acute blood loss anemia   HPI:  [name] is a 72 y.o. year old male who was last seen by [facility] in 2013 to schedule updated screening colonoscopy; his last colonoscopy was in Jan 2008 with a poor prep. At that time, he was undergoing physical therapy and recovering at [facility] (2013). He was to return in 2 months to arrange colonoscopy after further rehabilitation, and he was lost to follow-up. He has a history of cognitive impairment, mentally handicapped. He has been in long-term care centers since his parents death in the remote past. Per notes in epic, he has a history of "bleeding ulcers" at [facility] in the remote past.   Last Hgb on file in the 12 range in 2012. Presented to [facility] with Hgb 6.4. Evidence of IDA with ferritin 3. Heme positive in ED. Hgb has improved to 8.3 this morning s/p 2 units PRBCs. Per ED notes, patient has had a progressive decrease in Hgb since April.   Upon entering room, patient states he feels much better. He does not know the year or city, but he knows that he lives at [facility]. He states he has a history of bleeding ulcers in the remote past at [facility]. He is unable to tell me if an upper endoscopy was completed. He denies any abdominal pain, nausea, vomiting, dysphagia, loss of appetite. No changes in bowel habits. No overt GI bleeding per patient or nursing staff. He is agreeable to an upper endoscopy, but he does not want to pursue a colonoscopy. He has been NPO since midnight except for medications.   DSS is on file as POA. I have attempted to call the number listed Oretha Caprice, listed as legal guardian) but this is not correct. She is no longer working with DSS. I was transferred to Glennie Hawk. DSS legal guardian for patient.  Cell phone [phone]. She is agreeable to EGD.   Past Medical History:  Diagnosis Date  . Bell's palsy   . Bleeding ulcer    remote past  . Dementia    disoriented to time.does not know medical hx.information obtained from previous charts  . Hyperlipemia   . Hypertension   . Obesity   . SOB (shortness of breath) on exertion     Past Surgical History:  Procedure Laterality Date  . COLONOSCOPY  10/06/06   internal hemorrhoids and anal papilla/poor prep  . FOOT SURGERY    . KNEE ARTHROSCOPY  06/17/2011   Procedure: ARTHROSCOPY KNEE;  Surgeon: Sanjuana Kava;  Location: [facility];  Service: Orthopedics;  Laterality: Right;    Prior to Admission medications   Medication Sig Start Date End Date Taking? Authorizing Provider  acetaminophen (QC ARTHRITIS PAIN RELIEF) 650 MG CR tablet Take 650 mg by mouth every 8 (eight) hours as needed for pain.    Yes [provider]  aspirin EC 81 MG tablet Take 81 mg by mouth daily.    Yes [provider]  atenolol (TENORMIN) 50 MG tablet Take 50 mg by mouth daily.     Yes [provider]  atorvastatin (LIPITOR) 10 MG tablet Take 10 mg by mouth at bedtime.    Yes [provider]  benazepril (LOTENSIN) 10 MG tablet Take 10 mg by mouth daily.    Yes [provider]  carboxymethylcellulose (REFRESH TEARS) 0.5 % SOLN Apply 1 drop to eye 3 (three) times daily.   Yes [provider]  famotidine (PEPCID) 20 MG tablet Take 20 mg by mouth every evening.   Yes [provider]  furosemide (LASIX) 40 MG tablet Take 40 mg by mouth daily.    Yes [provider]  ipratropium-albuterol (DUONEB) 0.5-2.5 (3) MG/3ML SOLN Take 3 mLs by nebulization every 8 (eight) hours as needed (for shortness of breath).   Yes [provider]  ketoconazole (NIZORAL) 2 % shampoo Apply 1 application topically daily as needed for irritation (to scalp).   Yes [provider]  metFORMIN (GLUCOPHAGE) 500 MG  tablet Take 500 mg by mouth at bedtime.   Yes [provider]  mometasone (ELOCON) 0.1 % cream Apply 1 application topically daily as needed (for dry skin).   Yes [provider]  nystatin-triamcinolone (MYCOLOG II) cream Apply 1 application topically daily as needed (for rash).   Yes [provider]  potassium chloride SA (K-DUR,KLOR-CON) 20 MEQ tablet Take 20 mEq by mouth 2 (two) times daily.    Yes [provider]  vitamin B-12 (CYANOCOBALAMIN) 1000 MCG tablet Take 1,000 mcg by mouth daily.   Yes [provider]  magnesium hydroxide (MILK OF MAGNESIA) 400 MG/5ML suspension Take 30 mLs by mouth daily as needed for mild constipation or moderate constipation. For constipation     [provider]    Current Facility-Administered Medications  Medication Dose Route Frequency Provider Last Rate Last Dose  . 0.9 %  sodium chloride infusion  250 mL Intravenous PRN Phillips Grout, MD      . acetaminophen (TYLENOL) tablet 650 mg  650 mg Oral Q6H PRN Tat, David, MD      . atenolol (TENORMIN) tablet 25 mg  25 mg Oral Daily Tat, David, MD   25 mg at 05/01/18 0810  . atorvastatin (LIPITOR) tablet 10 mg  10 mg Oral QHS Derrill Kay A, MD   10 mg at 04/30/18 2200  . Chlorhexidine Gluconate Cloth 2 % PADS 6 each  6 each Topical Q0600 Phillips Grout, MD   6 each at 05/01/18 [phone]  . famotidine (PEPCID) IVPB 20 mg premix  20 mg Intravenous Q12H Phillips Grout, MD   Stopped at 05/01/18 0840  . ferumoxytol (FERAHEME) 510 mg in sodium chloride 0.9 % 100 mL IVPB  510 mg Intravenous Once Tat, David, MD      . furosemide (LASIX) tablet 40 mg  40 mg Oral Daily Phillips Grout, MD   40 mg at 05/01/18 0811  . insulin aspart (novoLOG) injection 0-9 Units  0-9 Units Subcutaneous TID WC Derrill Kay A, MD      . ipratropium-albuterol (DUONEB) 0.5-2.5 (3) MG/3ML nebulizer solution 3 mL  3 mL Nebulization Q8H PRN Phillips Grout, MD      . ketoconazole (NIZORAL) 2 %  shampoo 1 application  1 application Topical Daily PRN Derrill Kay A, MD      . magnesium hydroxide (MILK OF MAGNESIA) suspension 30 mL  30 mL Oral Daily PRN Phillips Grout, MD      . mupirocin ointment (BACTROBAN) 2 % 1 application  1 application Nasal BID Phillips Grout, MD   1 application at 62/70/35 0810  . ondansetron (ZOFRAN) injection 4 mg  4 mg Intravenous Q6H PRN Tat, David, MD      . polyvinyl alcohol (LIQUIFILM TEARS) 1.4 % ophthalmic solution  1 drop  1 drop Both Eyes TID Phillips Grout, MD   1 drop at 05/01/18 6834  . potassium chloride SA (K-DUR,KLOR-CON) CR tablet 20 mEq  20 mEq Oral BID Phillips Grout, MD   20 mEq at 05/01/18 0811  . sodium chloride flush (NS) 0.9 % injection 3 mL  3 mL Intravenous Q12H Derrill Kay A, MD   3 mL at 04/30/18 2200  . sodium chloride flush (NS) 0.9 % injection 3 mL  3 mL Intravenous PRN Phillips Grout, MD      . vitamin B-12 (CYANOCOBALAMIN) tablet 1,000 mcg  1,000 mcg Oral Daily Derrill Kay A, MD   1,000 mcg at 05/01/18 1962    Allergies as of 04/30/2018 - Review Complete 04/30/2018  Allergen Reaction Noted  . Hydrocodone-acetaminophen  06/17/2011    Family History  Problem Relation Age of Onset  . COPD Mother   . Cancer Brother   . Anesthesia problems Neg Hx   . Hypotension Neg Hx   . Malignant hyperthermia Neg Hx   . Pseudochol deficiency Neg Hx   . Colon cancer Neg Hx     Social History   Socioeconomic History  . Marital status: Single    Spouse name: Not on file  . Number of children: Not on file  . Years of education: Not on file  . Highest education level: Not on file  Occupational History  . Not on file  Social Needs  . Financial resource strain: Not on file  . Food insecurity:    Worry: Not on file    Inability: Not on file  . Transportation needs:    Medical: Not on file    Non-medical: Not on file  Tobacco Use  . Smoking status: Former Smoker    Packs/day: 1.00    Years: 30.00    Pack years: 30.00     Types: Cigarettes  . Tobacco comment: quit a "long time ago"   Substance and Sexual Activity  . Alcohol use: No  . Drug use: No  . Sexual activity: Never  Lifestyle  . Physical activity:    Days per week: Not on file    Minutes per session: Not on file  . Stress: Not on file  Relationships  . Social connections:    Talks on phone: Not on file    Gets together: Not on file    Attends religious service: Not on file    Active member of club or organization: Not on file    Attends meetings of clubs or organizations: Not on file    Relationship status: Not on file  . Intimate partner violence:    Fear of current or ex partner: Not on file    Emotionally abused: Not on file    Physically abused: Not on file    Forced sexual activity: Not on file  Other Topics Concern  . Not on file  Social History Narrative  . Not on file    Review of Systems: Limited due to cognitive status   Physical Exam: Vital signs in last 24 hours: Temp:  [97.5 F (36.4 C)-98.4 F (36.9 C)] 97.9 F (36.6 C) (08/05 0630) Pulse Rate:  [71-91] 72 (08/05 0630) Resp:  [11-24] 15 (08/05 0630) BP: (87-128)/(35-95) 113/73 (08/05 0630) SpO2:  [92 %-99 %] 97 % (08/05 0630) Weight:  [274 lb 7.6 oz (124.5 kg)] 274 lb 7.6 oz (124.5 kg) (08/04 2115)   General:   Alert,  Well-developed, well-nourished, pleasant  and cooperative in NAD Head:  Normocephalic and atraumatic. Eyes:  Sclera clear, no icterus.   Ears:  Normal auditory acuity. Nose:  No deformity, discharge,  or lesions. Lungs:  Clear throughout to auscultation.  Heart:  S1 S2 present without obvious murmur. Abdomen:  Soft, obese, nontender and nondistended. No masses, hepatosplenomegaly or hernias noted. Normal bowel sounds, without guarding, and without rebound.   Rectal:  Deferred   Extremities:  Marked pedal edema, 3+ pitting pedal and lower extremity edema Neurologic:  Alert and  oriented to person and situation only Psych:  Normal mood and  affect.  Intake/Output from previous day: 08/04 0701 - 08/05 0700 In: 1374 [Blood:324; IV Piggyback:1050] Out: 200 [Urine:200] Intake/Output this shift: No intake/output data recorded.  Lab Results: Recent Labs    04/30/18 1513 05/01/18 0706  WBC 4.1 5.7  HGB 6.4* 8.3*  HCT 23.2* 28.9*  PLT 217 180   BMET Recent Labs    04/30/18 1513 05/01/18 0706  NA 138 138  K 3.6 4.0  CL 103 102  CO2 30 30  GLUCOSE 130* 101*  BUN 15 16  CREATININE 0.86 0.81  CALCIUM 8.7* 8.4*   PT/INR Recent Labs    04/30/18 1513  LABPROT 13.6  INR 1.05     Impression: 72 year old male presenting with profound anemia, heme positive stool, but no overt GI bleeding. Per ED notes, staff at [facility] reported a slow trend in Hgb since April. He has had no overt GI bleeding here and responded appropriately to 2 units PRBCs. Evidence of IDA with ferritin profoundly low.   Possible history of ulcers per patient, but he is a limited historian. However, he does not want colonoscopy evaluation. Colon prep would be difficult given limited mobility, and he has a history of a poor prep in 2008. Recommend EGD to exclude occult upper GI source and consider colonoscopy if no obvious source for IDA. I note he has been on aspirin with Pepcid but no PPI prior to admission. There are no other NSAIDs listed or anticoagulation. He is agreeable to an EGD today after discussion of the risks, benefits, and alternatives. I have also spoken with DSS, Glennie Hawk [phone] cell phone), who is POA. She is agreeable to EGD for diagnostic purposes and is aware of the risks, benefits, and alternatives. Verbal consent obtained along with a second witness, Juanell Fairly, RN.   EGD with Dr. Oneida Alar for today. Patient has been NPO except medications since midnight.   Plan: Remain NPO EGD with Dr. Oneida Alar today Further recommendations to follow   Annitta Needs, PhD, ANP-BC [facility]     LOS: 0 days     05/01/2018, 8:59 AM

## 2018-05-01 NOTE — Discharge Summary (Signed)
Physician Discharge Summary  [name] ZOX:[id number] DOB: April 21, 1946 DOA: 04/30/2018  PCP: Rosita Fire, MD  Admit date: 04/30/2018 Discharge date: 05/02/2018  Admitted From: SNF Disposition:  SNF  Recommendations for Outpatient Follow-up:  1. Follow up with PCP in 1-2 weeks 2. Please obtain BMP/CBC in one week    Discharge Condition: Stable CODE STATUS: FULL Diet recommendation: Heart Healthy   Brief/Interim Summary: 72 year old male with a history of hypertension, hyperlipidemia, cognitive impairment presenting from [facility] due to low Hgb on labs.  Patient was noted to have Hgb of 6.  Unfortunately, the patient is unable to provide any significant history due to his cognitive impairment.  Patient denies fevers, chills, headache, chest pain, dyspnea, nausea, vomiting, diarrhea, abdominal pain, dysuria, hematuria, hematochezia, and melena. Upon presentation, patient was noted hemoglobin 6.4 with heme positive stool that was brown.  The patient was afebrile hemodynamically stable.    Discharge Diagnoses:  Acute blood loss anemia/heme positive stool -Transfused 2 units PRBC -Baseline hemoglobin unknown -GI consult appreciated -04/30/18 EGD--multiple stomach nodules-Diffuse moderate inflammation characterized by congestion (edema) and erythema was found in the gastric antrum.-, cannot r/o GIST -biopsies pending at time of d/c -Start PPI -Patient with remote history of peptic ulcer disease -IV fluids -Holding aspirin unless medically necessary  Essential hypertension -Holding atenolol and benazepril secondary to soft blood pressure -Holding furosemide -restart atenolol at lower dose 25 mg daily -will not restart benazepril -follow BP as outpt and restart BP meds as indicated  Hyperlipidemia -Continue statin  Iron deficiency anemia -Transfused Feraheme x1  Diabetes mellitus type 2 -Check hemoglobin A1c--pending at time of d/c -NovoLog sliding scale -Holding  metformin--restart after d/c     Discharge Instructions   Allergies as of 05/02/2018      Reactions   Hydrocodone-acetaminophen    unknown      Medication List    STOP taking these medications   aspirin EC 81 MG tablet   benazepril 10 MG tablet Commonly known as:  LOTENSIN   famotidine 20 MG tablet Commonly known as:  PEPCID     TAKE these medications   atenolol 25 MG tablet Commonly known as:  TENORMIN Take 1 tablet (25 mg total) by mouth daily. Start taking on:  05/03/2018 What changed:    medication strength  how much to take   atorvastatin 10 MG tablet Commonly known as:  LIPITOR Take 10 mg by mouth at bedtime.   furosemide 40 MG tablet Commonly known as:  LASIX Take 40 mg by mouth daily.   ipratropium-albuterol 0.5-2.5 (3) MG/3ML Soln Commonly known as:  DUONEB Take 3 mLs by nebulization every 8 (eight) hours as needed (for shortness of breath).   ketoconazole 2 % shampoo Commonly known as:  NIZORAL Apply 1 application topically daily as needed for irritation (to scalp).   magnesium hydroxide 400 MG/5ML suspension Commonly known as:  MILK OF MAGNESIA Take 30 mLs by mouth daily as needed for mild constipation or moderate constipation. For constipation   metFORMIN 500 MG tablet Commonly known as:  GLUCOPHAGE Take 500 mg by mouth at bedtime.   mometasone 0.1 % cream Commonly known as:  ELOCON Apply 1 application topically daily as needed (for dry skin).   nystatin-triamcinolone cream Commonly known as:  MYCOLOG II Apply 1 application topically daily as needed (for rash).   pantoprazole 40 MG tablet Commonly known as:  PROTONIX Take 1 tablet (40 mg total) by mouth 2 (two) times daily before a meal.   potassium chloride  SA 20 MEQ tablet Commonly known as:  K-DUR,KLOR-CON Take 20 mEq by mouth 2 (two) times daily.   QC ARTHRITIS PAIN RELIEF 650 MG CR tablet Generic drug:  acetaminophen Take 650 mg by mouth every 8 (eight) hours as needed for  pain.   REFRESH TEARS 0.5 % Soln Generic drug:  carboxymethylcellulose Apply 1 drop to eye 3 (three) times daily.   vitamin B-12 1000 MCG tablet Commonly known as:  CYANOCOBALAMIN Take 1,000 mcg by mouth daily.       Allergies  Allergen Reactions  . Hydrocodone-Acetaminophen     unknown    Consultations:  GI--[facility]   Procedures/Studies: No results found.      Discharge Exam: Vitals:   05/01/18 2112 05/02/18 0517  BP: (!) 108/51 (!) 114/46  Pulse: 71 70  Resp:    Temp: 98.1 F (36.7 C) 98.5 F (36.9 C)  SpO2: 98% 96%   Vitals:   05/01/18 1500 05/01/18 1529 05/01/18 2112 05/02/18 0517  BP: (!) 104/59 (!) 106/57 (!) 108/51 (!) 114/46  Pulse: 72 66 71 70  Resp: 13     Temp:   98.1 F (36.7 C) 98.5 F (36.9 C)  TempSrc:   Oral Oral  SpO2: 98% 94% 98% 96%  Weight:        General: Pt is alert, awake, not in acute distress Cardiovascular: RRR, S1/S2 +, no rubs, no gallops Respiratory: CTA bilaterally, no wheezing, no rhonchi Abdominal: Soft, NT, ND, bowel sounds + Extremities: no edema, no cyanosis   The results of significant diagnostics from this hospitalization (including imaging, microbiology, ancillary and laboratory) are listed below for reference.    Significant Diagnostic Studies: No results found.   Microbiology: Recent Results (from the past 240 hour(s))  MRSA PCR Screening     Status: Abnormal   Collection Time: 04/30/18  9:28 PM  Result Value Ref Range Status   MRSA by PCR POSITIVE (A) NEGATIVE Final    Comment:        The GeneXpert MRSA Assay (FDA approved for NASAL specimens only), is one component of a comprehensive MRSA colonization surveillance program. It is not intended to diagnose MRSA infection nor to guide or monitor treatment for MRSA infections. RESULT CALLED TO, READ BACK BY AND VERIFIED WITH: GRAVES,K @ 0034 ON 05/01/18 BY JUW Performed at [facility], [address], [place], [place] [address]       Labs: Basic Metabolic Panel: Recent Labs  Lab 04/30/18 1513 05/01/18 0706 05/02/18 0524  NA 138 138 139  K 3.6 4.0 3.7  CL 103 102 105  CO2 30 30 28   GLUCOSE 130* 101* 82  BUN 15 16 17   CREATININE 0.86 0.81 0.77  CALCIUM 8.7* 8.4* 8.5*  MG  --   --  2.0   Liver Function Tests: No results for input(s): AST, ALT, ALKPHOS, BILITOT, PROT, ALBUMIN in the last 168 hours. No results for input(s): LIPASE, AMYLASE in the last 168 hours. No results for input(s): AMMONIA in the last 168 hours. CBC: Recent Labs  Lab 04/30/18 1513 05/01/18 0706 05/02/18 0524  WBC 4.1 5.7 5.7  NEUTROABS 1.9  --   --   HGB 6.4* 8.3* 8.5*  HCT 23.2* 28.9* 28.6*  MCV 70.5* 74.5* 74.7*  PLT 217 180 168   Cardiac Enzymes: No results for input(s): CKTOTAL, CKMB, CKMBINDEX, TROPONINI in the last 168 hours. BNP: Invalid input(s): POCBNP CBG: Recent Labs  Lab 05/01/18 0802 05/01/18 1200 05/01/18 1621 05/01/18 2114 05/02/18 6812  GLUCAP 90 87 79 103* 82    Time coordinating discharge:  36 minutes  Signed:  Orson Eva, DO [facility] Pager: [phone] 05/02/2018, 10:50 AM

## 2018-05-01 NOTE — Progress Notes (Signed)
PROGRESS NOTE  [name] UDJ:[id number] DOB: 12-08-1945 DOA: 04/30/2018 PCP: Rosita Fire, MD  Brief History:  72 year old male with a history of hypertension, hyperlipidemia, cognitive impairment presenting from [facility] due to low Hgb on labs.  Patient was noted to have Hgb of 6.  Unfortunately, the patient is unable to provide any significant history due to his cognitive impairment.  Patient denies fevers, chills, headache, chest pain, dyspnea, nausea, vomiting, diarrhea, abdominal pain, dysuria, hematuria, hematochezia, and melena. Upon presentation, patient was noted hemoglobin 6.4 with heme positive stool that was brown.  The patient was afebrile hemodynamically stable.  Assessment/Plan: Acute blood loss anemia/heme positive stool -Transfused 2 units PRBC -Baseline hemoglobin unknown -GI consult -Start PPI -Patient with remote history of peptic ulcer disease -IV fluids -Holding aspirin  Essential hypertension -Holding atenolol and benazepril secondary to soft blood pressure -Holding furosemide  Hyperlipidemia -Continue statin  Iron deficiency anemia -Transfuse Feraheme  Diabetes mellitus type 2 -Check hemoglobin A1c -NovoLog sliding scale -Holding metformin   Disposition Plan:   SNF in 1-2 days  Family Communication:  No Family at bedside  Consultants:  GI  Code Status:  FULL   DVT Prophylaxis:  SCDs  Procedures: As Listed in Progress Note Above  Antibiotics: None    Subjective: Patient denies fevers, chills, headache, chest pain, dyspnea, nausea, vomiting, diarrhea, abdominal pain, dysuria, hematuria, hematochezia, and melena.   Objective: Vitals:   05/01/18 0253 05/01/18 0303 05/01/18 0333 05/01/18 0630  BP: (!) 110/58 119/62 (!) 104/54 113/73  Pulse: 76 78 78 72  Resp: 16 16 18 15   Temp: 97.8 F (36.6 C) 98 F (36.7 C) 98.4 F (36.9 C) 97.9 F (36.6 C)  TempSrc: Oral Oral Oral Oral  SpO2: 96% 96% 98% 97%  Weight:         Intake/Output Summary (Last 24 hours) at 05/01/2018 0834 Last data filed at 05/01/2018 0600 Gross per 24 hour  Intake 1374 ml  Output 200 ml  Net 1174 ml   Weight change:  Exam:   General:  Pt is alert, follows commands appropriately, not in acute distress  HEENT: No icterus, No thrush, No neck mass, [place]/AT  Cardiovascular: RRR, S1/S2, no rubs, no gallops  Respiratory: CTA bilaterally, no wheezing, no crackles, no rhonchi  Abdomen: Soft/+BS, non tender, non distended, no guarding  Extremities: 2+LE edema, No lymphangitis, No petechiae, No rashes, no synovitis   Data Reviewed: I have personally reviewed following labs and imaging studies Basic Metabolic Panel: Recent Labs  Lab 04/30/18 1513 05/01/18 0706  NA 138 138  K 3.6 4.0  CL 103 102  CO2 30 30  GLUCOSE 130* 101*  BUN 15 16  CREATININE 0.86 0.81  CALCIUM 8.7* 8.4*   Liver Function Tests: No results for input(s): AST, ALT, ALKPHOS, BILITOT, PROT, ALBUMIN in the last 168 hours. No results for input(s): LIPASE, AMYLASE in the last 168 hours. No results for input(s): AMMONIA in the last 168 hours. Coagulation Profile: Recent Labs  Lab 04/30/18 1513  INR 1.05   CBC: Recent Labs  Lab 04/30/18 1513 05/01/18 0706  WBC 4.1 5.7  NEUTROABS 1.9  --   HGB 6.4* 8.3*  HCT 23.2* 28.9*  MCV 70.5* 74.5*  PLT 217 180   Cardiac Enzymes: No results for input(s): CKTOTAL, CKMB, CKMBINDEX, TROPONINI in the last 168 hours. BNP: Invalid input(s): POCBNP CBG: Recent Labs  Lab 04/30/18 2131  GLUCAP 122*   HbA1C: No results  for input(s): HGBA1C in the last 72 hours. Urine analysis:    Component Value Date/Time   COLORURINE YELLOW 05/27/2010 2038   APPEARANCEUR CLEAR 05/27/2010 2038   LABSPEC 1.015 05/27/2010 2038   PHURINE 5.0 05/27/2010 2038   GLUCOSEU NEGATIVE 05/27/2010 2038   HGBUR TRACE (A) 05/27/2010 2038   BILIRUBINUR NEGATIVE 05/27/2010 2038   KETONESUR NEGATIVE 05/27/2010 2038   PROTEINUR  NEGATIVE 05/27/2010 2038   UROBILINOGEN 0.2 05/27/2010 2038   NITRITE NEGATIVE 05/27/2010 2038   LEUKOCYTESUR NEGATIVE 05/27/2010 2038   Sepsis Labs: @LABRCNTIP (procalcitonin:4,lacticidven:4) ) Recent Results (from the past 240 hour(s))  MRSA PCR Screening     Status: Abnormal   Collection Time: 04/30/18  9:28 PM  Result Value Ref Range Status   MRSA by PCR POSITIVE (A) NEGATIVE Final    Comment:        The GeneXpert MRSA Assay (FDA approved for NASAL specimens only), is one component of a comprehensive MRSA colonization surveillance program. It is not intended to diagnose MRSA infection nor to guide or monitor treatment for MRSA infections. RESULT CALLED TO, READ BACK BY AND VERIFIED WITH: GRAVES,K @ 0034 ON 05/01/18 BY JUW Performed at [facility], [address] [place], [place] [address]      Scheduled Meds: . atenolol  25 mg Oral Daily  . atorvastatin  10 mg Oral QHS  . Chlorhexidine Gluconate Cloth  6 each Topical Q0600  . furosemide  40 mg Oral Daily  . insulin aspart  0-9 Units Subcutaneous TID WC  . mupirocin ointment  1 application Nasal BID  . polyvinyl alcohol  1 drop Both Eyes TID  . potassium chloride SA  20 mEq Oral BID  . sodium chloride flush  3 mL Intravenous Q12H  . vitamin B-12  1,000 mcg Oral Daily   Continuous Infusions: . sodium chloride    . famotidine (PEPCID) IV 20 mg (05/01/18 7517)    Procedures/Studies: No results found.  Orson Eva, DO  [facility] Pager [phone]  If 7PM-7AM, please contact night-coverage [email or web address] Password [email or web address] 05/01/2018, 8:34 AM   LOS: 0 days

## 2018-05-01 NOTE — Interval H&P Note (Signed)
History and Physical Interval Note:  05/01/2018 2:56 PM  [name]  has presented today for surgery, with the diagnosis of anemia, heme positive stool  The various methods of treatment have been discussed with the patient and family. After consideration of risks, benefits and other options for treatment, the patient has consented to  Procedure(s): ESOPHAGOGASTRODUODENOSCOPY (EGD) (N/A) as a surgical intervention .  The patient's history has been reviewed, patient examined, no change in status, stable for surgery.  I have reviewed the patient's chart and labs.  Questions were answered to the patient's satisfaction.  DISCUSSED PROCEDURE, BENEFITS, & RISKS: < 1% chance of medication reaction, bleeding, OR perforation PRIOR TO SEDATION.   [employer]

## 2018-05-01 NOTE — Care Management Obs Status (Signed)
[place] NOTIFICATION   Patient Details  Name: [name] MRN: [id number] Date of Birth: 1946/01/28   Medicare Observation Status Notification Given:  Yes(legal [place])    [name], [name], RN 05/01/2018, 11:22 AM

## 2018-05-01 NOTE — Consult Note (Addendum)
Referring Provider: Dr. Carles Collet  Primary Care Physician:  Rosita Fire, MD Primary Gastroenterologist:  Dr. Gala Romney   Date of Admission: 04/30/18 Date of Consultation: 05/01/18  Reason for Consultation:  Heme positive stool, acute blood loss anemia   HPI:  [name] is a 72 y.o. year old male who was last seen by [facility] in 2013 to schedule updated screening colonoscopy; his last colonoscopy was in Jan 2008 with a poor prep. At that time, he was undergoing physical therapy and recovering at [facility] (2013). He was to return in 2 months to arrange colonoscopy after further rehabilitation, and he was lost to follow-up. He has a history of cognitive impairment, mentally handicapped. He has been in long-term care centers since his parents death in the remote past. Per notes in epic, he has a history of "bleeding ulcers" at [facility] in the remote past.   Last Hgb on file in the 12 range in 2012. Presented to [facility] with Hgb 6.4. Evidence of IDA with ferritin 3. Heme positive in ED. Hgb has improved to 8.3 this morning s/p 2 units PRBCs. Per ED notes, patient has had a progressive decrease in Hgb since April.   Upon entering room, patient states he feels much better. He does not know the year or city, but he knows that he lives at [facility]. He states he has a history of bleeding ulcers in the remote past at [facility]. He is unable to tell me if an upper endoscopy was completed. He denies any abdominal pain, nausea, vomiting, dysphagia, loss of appetite. No changes in bowel habits. No overt GI bleeding per patient or nursing staff. He is agreeable to an upper endoscopy, but he does not want to pursue a colonoscopy. He has been NPO since midnight except for medications.   DSS is on file as POA. I have attempted to call the number listed Oretha Caprice, listed as legal guardian) but this is not correct. She is no longer working with DSS. I was transferred to Glennie Hawk. DSS legal guardian for patient.  Cell phone [phone]. She is agreeable to EGD.   Past Medical History:  Diagnosis Date  . Bell's palsy   . Bleeding ulcer    remote past  . Dementia    disoriented to time.does not know medical hx.information obtained from previous charts  . Hyperlipemia   . Hypertension   . Obesity   . SOB (shortness of breath) on exertion     Past Surgical History:  Procedure Laterality Date  . COLONOSCOPY  10/06/06   internal hemorrhoids and anal papilla/poor prep  . FOOT SURGERY    . KNEE ARTHROSCOPY  06/17/2011   Procedure: ARTHROSCOPY KNEE;  Surgeon: Sanjuana Kava;  Location: [facility];  Service: Orthopedics;  Laterality: Right;    Prior to Admission medications   Medication Sig Start Date End Date Taking? Authorizing Provider  acetaminophen (QC ARTHRITIS PAIN RELIEF) 650 MG CR tablet Take 650 mg by mouth every 8 (eight) hours as needed for pain.    Yes [provider]  aspirin EC 81 MG tablet Take 81 mg by mouth daily.    Yes [provider]  atenolol (TENORMIN) 50 MG tablet Take 50 mg by mouth daily.     Yes [provider]  atorvastatin (LIPITOR) 10 MG tablet Take 10 mg by mouth at bedtime.    Yes [provider]  benazepril (LOTENSIN) 10 MG tablet Take 10 mg by mouth daily.    Yes [provider]  carboxymethylcellulose (REFRESH TEARS) 0.5 % SOLN Apply 1 drop to eye 3 (three) times daily.   Yes [provider]  famotidine (PEPCID) 20 MG tablet Take 20 mg by mouth every evening.   Yes [provider]  furosemide (LASIX) 40 MG tablet Take 40 mg by mouth daily.    Yes [provider]  ipratropium-albuterol (DUONEB) 0.5-2.5 (3) MG/3ML SOLN Take 3 mLs by nebulization every 8 (eight) hours as needed (for shortness of breath).   Yes [provider]  ketoconazole (NIZORAL) 2 % shampoo Apply 1 application topically daily as needed for irritation (to scalp).   Yes [provider]  metFORMIN (GLUCOPHAGE) 500 MG  tablet Take 500 mg by mouth at bedtime.   Yes [provider]  mometasone (ELOCON) 0.1 % cream Apply 1 application topically daily as needed (for dry skin).   Yes [provider]  nystatin-triamcinolone (MYCOLOG II) cream Apply 1 application topically daily as needed (for rash).   Yes [provider]  potassium chloride SA (K-DUR,KLOR-CON) 20 MEQ tablet Take 20 mEq by mouth 2 (two) times daily.    Yes [provider]  vitamin B-12 (CYANOCOBALAMIN) 1000 MCG tablet Take 1,000 mcg by mouth daily.   Yes [provider]  magnesium hydroxide (MILK OF MAGNESIA) 400 MG/5ML suspension Take 30 mLs by mouth daily as needed for mild constipation or moderate constipation. For constipation     [provider]    Current Facility-Administered Medications  Medication Dose Route Frequency Provider Last Rate Last Dose  . 0.9 %  sodium chloride infusion  250 mL Intravenous PRN Phillips Grout, MD      . acetaminophen (TYLENOL) tablet 650 mg  650 mg Oral Q6H PRN Tat, David, MD      . atenolol (TENORMIN) tablet 25 mg  25 mg Oral Daily Tat, David, MD   25 mg at 05/01/18 0810  . atorvastatin (LIPITOR) tablet 10 mg  10 mg Oral QHS Derrill Kay A, MD   10 mg at 04/30/18 2200  . Chlorhexidine Gluconate Cloth 2 % PADS 6 each  6 each Topical Q0600 Phillips Grout, MD   6 each at 05/01/18 [phone]  . famotidine (PEPCID) IVPB 20 mg premix  20 mg Intravenous Q12H Phillips Grout, MD   Stopped at 05/01/18 0840  . ferumoxytol (FERAHEME) 510 mg in sodium chloride 0.9 % 100 mL IVPB  510 mg Intravenous Once Tat, David, MD      . furosemide (LASIX) tablet 40 mg  40 mg Oral Daily Phillips Grout, MD   40 mg at 05/01/18 0811  . insulin aspart (novoLOG) injection 0-9 Units  0-9 Units Subcutaneous TID WC Derrill Kay A, MD      . ipratropium-albuterol (DUONEB) 0.5-2.5 (3) MG/3ML nebulizer solution 3 mL  3 mL Nebulization Q8H PRN Phillips Grout, MD      . ketoconazole (NIZORAL) 2 %  shampoo 1 application  1 application Topical Daily PRN Derrill Kay A, MD      . magnesium hydroxide (MILK OF MAGNESIA) suspension 30 mL  30 mL Oral Daily PRN Phillips Grout, MD      . mupirocin ointment (BACTROBAN) 2 % 1 application  1 application Nasal BID Phillips Grout, MD   1 application at 09/81/19 0810  . ondansetron (ZOFRAN) injection 4 mg  4 mg Intravenous Q6H PRN Tat, David, MD      . polyvinyl alcohol (LIQUIFILM TEARS) 1.4 % ophthalmic solution  1 drop  1 drop Both Eyes TID Phillips Grout, MD   1 drop at 05/01/18 6237  . potassium chloride SA (K-DUR,KLOR-CON) CR tablet 20 mEq  20 mEq Oral BID Phillips Grout, MD   20 mEq at 05/01/18 0811  . sodium chloride flush (NS) 0.9 % injection 3 mL  3 mL Intravenous Q12H Derrill Kay A, MD   3 mL at 04/30/18 2200  . sodium chloride flush (NS) 0.9 % injection 3 mL  3 mL Intravenous PRN Phillips Grout, MD      . vitamin B-12 (CYANOCOBALAMIN) tablet 1,000 mcg  1,000 mcg Oral Daily Derrill Kay A, MD   1,000 mcg at 05/01/18 6283    Allergies as of 04/30/2018 - Review Complete 04/30/2018  Allergen Reaction Noted  . Hydrocodone-acetaminophen  06/17/2011    Family History  Problem Relation Age of Onset  . COPD Mother   . Cancer Brother   . Anesthesia problems Neg Hx   . Hypotension Neg Hx   . Malignant hyperthermia Neg Hx   . Pseudochol deficiency Neg Hx   . Colon cancer Neg Hx     Social History   Socioeconomic History  . Marital status: Single    Spouse name: Not on file  . Number of children: Not on file  . Years of education: Not on file  . Highest education level: Not on file  Occupational History  . Not on file  Social Needs  . Financial resource strain: Not on file  . Food insecurity:    Worry: Not on file    Inability: Not on file  . Transportation needs:    Medical: Not on file    Non-medical: Not on file  Tobacco Use  . Smoking status: Former Smoker    Packs/day: 1.00    Years: 30.00    Pack years: 30.00     Types: Cigarettes  . Tobacco comment: quit a "long time ago"   Substance and Sexual Activity  . Alcohol use: No  . Drug use: No  . Sexual activity: Never  Lifestyle  . Physical activity:    Days per week: Not on file    Minutes per session: Not on file  . Stress: Not on file  Relationships  . Social connections:    Talks on phone: Not on file    Gets together: Not on file    Attends religious service: Not on file    Active member of club or organization: Not on file    Attends meetings of clubs or organizations: Not on file    Relationship status: Not on file  . Intimate partner violence:    Fear of current or ex partner: Not on file    Emotionally abused: Not on file    Physically abused: Not on file    Forced sexual activity: Not on file  Other Topics Concern  . Not on file  Social History Narrative  . Not on file    Review of Systems: Limited due to cognitive status   Physical Exam: Vital signs in last 24 hours: Temp:  [97.5 F (36.4 C)-98.4 F (36.9 C)] 97.9 F (36.6 C) (08/05 0630) Pulse Rate:  [71-91] 72 (08/05 0630) Resp:  [11-24] 15 (08/05 0630) BP: (87-128)/(35-95) 113/73 (08/05 0630) SpO2:  [92 %-99 %] 97 % (08/05 0630) Weight:  [274 lb 7.6 oz (124.5 kg)] 274 lb 7.6 oz (124.5 kg) (08/04 2115)   General:   Alert,  Well-developed, well-nourished, pleasant  and cooperative in NAD Head:  Normocephalic and atraumatic. Eyes:  Sclera clear, no icterus.   Ears:  Normal auditory acuity. Nose:  No deformity, discharge,  or lesions. Lungs:  Clear throughout to auscultation.  Heart:  S1 S2 present without obvious murmur. Abdomen:  Soft, obese, nontender and nondistended. No masses, hepatosplenomegaly or hernias noted. Normal bowel sounds, without guarding, and without rebound.   Rectal:  Deferred   Extremities:  Marked pedal edema, 3+ pitting pedal and lower extremity edema Neurologic:  Alert and  oriented to person and situation only Psych:  Normal mood and  affect.  Intake/Output from previous day: 08/04 0701 - 08/05 0700 In: 1374 [Blood:324; IV Piggyback:1050] Out: 200 [Urine:200] Intake/Output this shift: No intake/output data recorded.  Lab Results: Recent Labs    04/30/18 1513 05/01/18 0706  WBC 4.1 5.7  HGB 6.4* 8.3*  HCT 23.2* 28.9*  PLT 217 180   BMET Recent Labs    04/30/18 1513 05/01/18 0706  NA 138 138  K 3.6 4.0  CL 103 102  CO2 30 30  GLUCOSE 130* 101*  BUN 15 16  CREATININE 0.86 0.81  CALCIUM 8.7* 8.4*   PT/INR Recent Labs    04/30/18 1513  LABPROT 13.6  INR 1.05     Impression: 72 year old male presenting with profound anemia, heme positive stool, but no overt GI bleeding. Per ED notes, staff at [facility] reported a slow trend in Hgb since April. He has had no overt GI bleeding here and responded appropriately to 2 units PRBCs. Evidence of IDA with ferritin profoundly low.   Possible history of ulcers per patient, but he is a limited historian. However, he does not want colonoscopy evaluation. Colon prep would be difficult given limited mobility, and he has a history of a poor prep in 2008. Recommend EGD to exclude occult upper GI source and consider colonoscopy if no obvious source for IDA. I note he has been on aspirin with Pepcid but no PPI prior to admission. There are no other NSAIDs listed or anticoagulation. He is agreeable to an EGD today after discussion of the risks, benefits, and alternatives. I have also spoken with DSS, Glennie Hawk [phone] cell phone), who is POA. She is agreeable to EGD for diagnostic purposes and is aware of the risks, benefits, and alternatives. Verbal consent obtained along with a second witness, Juanell Fairly, RN.   EGD with Dr. Oneida Alar for today. Patient has been NPO except medications since midnight.   Plan: Remain NPO EGD with Dr. Oneida Alar today Further recommendations to follow   Annitta Needs, PhD, ANP-BC [facility]     LOS: 0 days     05/01/2018, 8:59 AM

## 2018-05-01 NOTE — Op Note (Signed)
[facility] Patient Name: Demitrious Mccannon Procedure Date: 05/01/2018 2:13 PM MRN: [id number] Date of Birth: 11-Apr-1946 Attending MD: Barney Drain MD, MD CSN: [id number] Age: 72 Admit Type: Inpatient Procedure:                Upper GI endoscopy WITH COLD FORCEPS BIOPSY Indications:              Heme positive stool, Anemia Providers:                Barney Drain MD, MD, Lurline Del, RN, Nelma Rothman,                            Technician Referring MD:             Rosita Fire MD, MD Medicines:                Meperidine 50 mg IV, Midazolam 3 mg IV Complications:            No immediate complications. Estimated Blood Loss:     Estimated blood loss was minimal. Procedure:                Pre-Anesthesia Assessment:                           - Prior to the procedure, a History and Physical                            was performed, and patient medications and                            allergies were reviewed. The patient's tolerance of                            previous anesthesia was also reviewed. The risks                            and benefits of the procedure and the sedation                            options and risks were discussed with the patient.                            All questions were answered, and informed consent                            was obtained. Prior Anticoagulants: The patient has                            taken aspirin, last dose was 1 day prior to                            procedure. ASA Grade Assessment: III - A patient                            with severe systemic disease. After reviewing the  risks and benefits, the patient was deemed in                            satisfactory condition to undergo the procedure.                            After obtaining informed consent, the endoscope was                            passed under direct vision. Throughout the                            procedure, the patient's blood pressure, pulse,  and                            oxygen saturations were monitored continuously. The                            [id number]-[id number] ([id number]) scope was introduced through the                            mouth, and advanced to the second part of duodenum.                            The upper GI endoscopy was accomplished without                            difficulty. The patient tolerated the procedure                            well. Scope In: 2:40:17 PM Scope Out: 2:51:53 PM Total Procedure Duration: 0 hours 11 minutes 36 seconds  Findings:      The examined esophagus was normal.      Multiple 3 to 6 mm papules (nodules) with MUCOSL CAP BUT no bleeding and       stigmata of recent bleeding BUT WITH SURROUNDING TELANGECTASIAS were       found in the gastric antrum. Biopsies(btl 1) were taken with a cold       forceps for histology.      Diffuse moderate inflammation characterized by congestion (edema) and       erythema was found in the gastric antrum. Biopsies(BTL -3 ANTRUM/2:       BODY) were taken with a cold forceps for Helicobacter pylori testing.      The examined duodenum was normal. Impression:               - HEME POSITIVE STOOLS/ANEMIA MOST LIKELY DUE TO                            Multiple nodules found in the stomach/Gastritis.                            Biopsied. Moderate Sedation:      Moderate (conscious) sedation was administered by the endoscopy nurse       and supervised by the endoscopist. The following  parameters were       monitored: oxygen saturation, heart rate, blood pressure, and response       to care. Total physician intraservice time was 31 minutes. Recommendation:           - Return patient to hospital ward.                           - Soft diet and low fat diet.                           - Continue present medications. D/C PEPCID. ADD                            PROTONIX DAILY. AVOID ASA UNLESS MEDICALLY                            NCESSATY. OK TO CONTINUE LOVENOX.                            - Await pathology results.                           - Return to SEE DR. Gala Romney at appointment to be                            scheduled. Procedure Code(s):        --- Professional ---                           [phone], Esophagogastroduodenoscopy, flexible,                            transoral; with biopsy, single or multiple                           G0500, Moderate sedation services provided by the                            same physician or other qualified health care                            professional performing a gastrointestinal                            endoscopic service that sedation supports,                            requiring the presence of an independent trained                            observer to assist in the monitoring of the                            patient's level of consciousness and physiological  status; initial 15 minutes of intra-service time;                            patient age 44 years or older (additional time may                            be reported with [phone], as appropriate)                           [phone], Moderate sedation services provided by the                            same physician or other qualified health care                            professional performing the diagnostic or                            therapeutic service that the sedation supports,                            requiring the presence of an independent trained                            observer to assist in the monitoring of the                            patient's level of consciousness and physiological                            status; each additional 15 minutes intraservice                            time (List separately in addition to code for                            primary service) Diagnosis Code(s):        --- Professional ---                           K31.89, Other diseases of stomach and duodenum                            K29.70, Gastritis, unspecified, without bleeding                           R19.5, Other fecal abnormalities                           D64.9, Anemia, unspecified CPT copyright 2017 American Medical Association. All rights reserved. The codes documented in this report are preliminary and upon coder review may  be revised to meet current compliance requirements. Barney Drain, MD Barney Drain MD, MD 05/01/2018 3:06:06 PM This report has been signed electronically. Number of Addenda: 0

## 2018-05-02 ENCOUNTER — Telehealth: Payer: Self-pay | Admitting: Gastroenterology

## 2018-05-02 DIAGNOSIS — I959 Hypotension, unspecified: Secondary | ICD-10-CM | POA: Diagnosis not present

## 2018-05-02 DIAGNOSIS — D62 Acute posthemorrhagic anemia: Secondary | ICD-10-CM | POA: Diagnosis not present

## 2018-05-02 DIAGNOSIS — R195 Other fecal abnormalities: Secondary | ICD-10-CM | POA: Diagnosis not present

## 2018-05-02 DIAGNOSIS — D500 Iron deficiency anemia secondary to blood loss (chronic): Secondary | ICD-10-CM | POA: Diagnosis not present

## 2018-05-02 DIAGNOSIS — I10 Essential (primary) hypertension: Secondary | ICD-10-CM | POA: Diagnosis not present

## 2018-05-02 LAB — CBC
HEMATOCRIT: 28.6 % — AB (ref 39.0–52.0)
Hemoglobin: 8.5 g/dL — ABNORMAL LOW (ref 13.0–17.0)
MCH: 22.2 pg — AB (ref 26.0–34.0)
MCHC: 29.7 g/dL — ABNORMAL LOW (ref 30.0–36.0)
MCV: 74.7 fL — AB (ref 78.0–100.0)
PLATELETS: 168 10*3/uL (ref 150–400)
RBC: 3.83 MIL/uL — AB (ref 4.22–5.81)
RDW: 18.8 % — ABNORMAL HIGH (ref 11.5–15.5)
WBC: 5.7 10*3/uL (ref 4.0–10.5)

## 2018-05-02 LAB — TYPE AND SCREEN
ABO/RH(D): O NEG
Antibody Screen: POSITIVE
DONOR AG TYPE: NEGATIVE
DONOR AG TYPE: NEGATIVE
Unit division: 0
Unit division: 0

## 2018-05-02 LAB — BPAM RBC
BLOOD PRODUCT EXPIRATION DATE: 201908202359
Blood Product Expiration Date: 201909022359
ISSUE DATE / TIME: 201908042342
ISSUE DATE / TIME: 201908051131
UNIT TYPE AND RH: 9500
Unit Type and Rh: 6200

## 2018-05-02 LAB — MAGNESIUM: Magnesium: 2 mg/dL (ref 1.7–2.4)

## 2018-05-02 LAB — GLUCOSE, CAPILLARY
Glucose-Capillary: 82 mg/dL (ref 70–99)
Glucose-Capillary: 92 mg/dL (ref 70–99)

## 2018-05-02 LAB — BASIC METABOLIC PANEL
Anion gap: 6 (ref 5–15)
BUN: 17 mg/dL (ref 8–23)
CHLORIDE: 105 mmol/L (ref 98–111)
CO2: 28 mmol/L (ref 22–32)
CREATININE: 0.77 mg/dL (ref 0.61–1.24)
Calcium: 8.5 mg/dL — ABNORMAL LOW (ref 8.9–10.3)
GFR calc non Af Amer: >60 mL/min (ref >60)
Glucose, Bld: 82 mg/dL (ref 70–99)
Potassium: 3.7 mmol/L (ref 3.5–5.1)
SODIUM: 139 mmol/L (ref 135–145)

## 2018-05-02 LAB — HEMOGLOBIN A1C
Hgb A1c MFr Bld: 6.2 % — ABNORMAL HIGH (ref 4.8–5.6)
Mean Plasma Glucose: 131.24 mg/dL

## 2018-05-02 MED ORDER — ATENOLOL 25 MG PO TABS: 25.0000 mg | ORAL_TABLET | Freq: Every day | ORAL | 0 refills | Status: DC

## 2018-05-02 NOTE — NC FL2 (Signed)
[place] LEVEL OF CARE SCREENING TOOL     IDENTIFICATION  Patient Name: [name] Birthdate: [birth year] Sex: male Admission Date (Current Location): 04/30/2018  [facility] and [place] Number:  [employer] and Address:  [facility] [address], [place]      Provider Number: [id number]  Attending Physician Name and Address:  Orson Eva, MD  Relative Name and Phone Number:       Current Level of Care: Other (Comment)(observation) Recommended Level of Care: [place] Prior Approval Number:    Date Approved/Denied:   PASRR Number:    Discharge Plan: SNF    Current Diagnoses: Patient Active Problem List   Diagnosis Date Noted  . Hypotension   . Acute blood loss anemia 05/01/2018  . Heme positive stool 05/01/2018  . Guaiac positive stools   . Iron deficiency anemia due to chronic blood loss   . GIB (gastrointestinal bleeding) 04/30/2018  . Bleeding ulcer   . Hypertension   . Encounter for screening colonoscopy 10/08/2011  . CLOSED DISLOCATION OF FOOT UNSPECIFIED PART 01/15/2010    Orientation RESPIRATION BLADDER Height & Weight     Self, Place, Situation  Normal Incontinent Weight: 274 lb 7.6 oz (124.5 kg) Height:     BEHAVIORAL SYMPTOMS/MOOD NEUROLOGICAL BOWEL NUTRITION STATUS      Incontinent Diet(heart healthy)  AMBULATORY STATUS COMMUNICATION OF NEEDS Skin   Total Care(wheelchair ) Verbally Other (Comment)                       Personal Care Assistance Level of Assistance  Bathing, Feeding, Dressing Bathing Assistance: Maximum assistance Feeding assistance: Independent Dressing Assistance: Maximum assistance     Functional Limitations Info             SPECIAL CARE FACTORS FREQUENCY        PT Frequency: n/a              Contractures Contractures Info: Not present    Additional Factors Info  Code Status, Allergies, Isolation Precautions Code Status Info: Full  Code Allergies Info: Hydrocodone-acetaminophen     Isolation Precautions Info: MRSA + by PCR 04/30/2018     Current Medications (05/02/2018):  This is the current hospital active medication list Current Facility-Administered Medications  Medication Dose Route Frequency Provider Last Rate Last Dose  . 0.9 %  sodium chloride infusion  250 mL Intravenous PRN Fields, Sandi L, MD      . acetaminophen (TYLENOL) tablet 650 mg  650 mg Oral Q6H PRN Fields, Sandi L, MD      . atenolol (TENORMIN) tablet 25 mg  25 mg Oral Daily Fields, Sandi L, MD   25 mg at 05/02/18 0824  . atorvastatin (LIPITOR) tablet 10 mg  10 mg Oral QHS Barney Drain L, MD   10 mg at 05/01/18 2307  . Chlorhexidine Gluconate Cloth 2 % PADS 6 each  6 each Topical Q0600 Danie Binder, MD   6 each at 05/02/18 [phone]  . furosemide (LASIX) tablet 40 mg  40 mg Oral Daily Fields, Sandi L, MD   40 mg at 05/02/18 0825  . insulin aspart (novoLOG) injection 0-9 Units  0-9 Units Subcutaneous TID WC Fields, Sandi L, MD      . ipratropium-albuterol (DUONEB) 0.5-2.5 (3) MG/3ML nebulizer solution 3 mL  3 mL Nebulization Q8H PRN Fields, Sandi L, MD      . ketoconazole (NIZORAL) 2 % shampoo 1  application  1 application Topical Daily PRN Fields, Sandi L, MD      . magnesium hydroxide (MILK OF MAGNESIA) suspension 30 mL  30 mL Oral Daily PRN Fields, Sandi L, MD      . mupirocin ointment (BACTROBAN) 2 % 1 application  1 application Nasal BID Danie Binder, MD   1 application at 01/74/94 [phone]  . ondansetron (ZOFRAN) injection 4 mg  4 mg Intravenous Q6H PRN Fields, Sandi L, MD      . pantoprazole (PROTONIX) EC tablet 40 mg  40 mg Oral BID AC Fields, Sandi L, MD   40 mg at 05/02/18 0824  . polyvinyl alcohol (LIQUIFILM TEARS) 1.4 % ophthalmic solution 1 drop  1 drop Both Eyes TID Danie Binder, MD   1 drop at 05/02/18 0824  . potassium chloride SA (K-DUR,KLOR-CON) CR tablet 20 mEq  20 mEq Oral BID Barney Drain L, MD   20 mEq at 05/02/18 0825  . sodium  chloride flush (NS) 0.9 % injection 3 mL  3 mL Intravenous Q12H Fields, Sandi L, MD   3 mL at 04/30/18 2200  . sodium chloride flush (NS) 0.9 % injection 3 mL  3 mL Intravenous PRN Fields, Sandi L, MD      . vitamin B-12 (CYANOCOBALAMIN) tablet 1,000 mcg  1,000 mcg Oral Daily Danie Binder, MD   1,000 mcg at 05/02/18 5916     Discharge Medications: Please see discharge summary for a list of discharge medications.  Relevant Imaging Results:  Relevant Lab Results:   Additional Information    Takeria Marquina, Clydene Pugh, LCSW

## 2018-05-02 NOTE — Progress Notes (Addendum)
    Subjective: No abdominal pain, N/V. No melena or hematochezia. Tolerating diet. States he feels back to his old self.   Objective: Vital signs in last 24 hours: Temp:  [97.7 F (36.5 C)-98.5 F (36.9 C)] 98.5 F (36.9 C) (08/06 0517) Pulse Rate:  [66-78] 70 (08/06 0517) Resp:  [8-17] 13 (08/05 1500) BP: (73-132)/(24-75) 114/46 (08/06 0517) SpO2:  [94 %-99 %] 96 % (08/06 0517)   General:   Alert and oriented to person and place, pleasant Abdomen:  Bowel sounds present, soft, non-tender, non-distended. No HSM or hernias noted Extremities:  Marked 3+ pedal edema Neurologic:  Alert and  oriented to person and place   Intake/Output from previous day: 08/05 0701 - 08/06 0700 In: 288.3 [P.O.:240; IV Piggyback:48.3] Out: 3734 [Urine:1550] Intake/Output this shift: No intake/output data recorded.  Lab Results: Recent Labs    04/30/18 1513 05/01/18 0706 05/02/18 0524  WBC 4.1 5.7 5.7  HGB 6.4* 8.3* 8.5*  HCT 23.2* 28.9* 28.6*  PLT 217 180 168   BMET Recent Labs    04/30/18 1513 05/01/18 0706 05/02/18 0524  NA 138 138 139  K 3.6 4.0 3.7  CL 103 102 105  CO2 30 30 28   GLUCOSE 130* 101* 82  BUN 15 16 17   CREATININE 0.86 0.81 0.77  CALCIUM 8.7* 8.4* 8.5*   PT/INR Recent Labs    04/30/18 1513  LABPROT 13.6  INR 1.05    Assessment: 72 year old male admitted with profound anemia, heme positive stool, no overt GI bleeding. Declined colonoscopy. EGD yesterday with multiple 3-6 mm nodules and surrounding  telangectasias, gastritis, s/p biopsy. Likely contributing to anemia. Hgb stable.   Appropriate for discharge today; we will arrange outpatient follow-up. Consider outpatient colonoscopy if patient is agreeable.     Plan: PPI BID Avoid aspirin unless medically necessary Soft/low fat diet Follow-up as outpatient Discuss colonoscopy as outpatient  Annitta Needs, PhD, ANP-BC [facility]      LOS: 0 days    05/02/2018, 8:11 AM

## 2018-05-02 NOTE — Telephone Encounter (Signed)
Please arrange outpatient follow-up, RMR patient, hospital follow-up.

## 2018-05-02 NOTE — Progress Notes (Signed)
Report called to April at [facility]

## 2018-05-04 ENCOUNTER — Encounter: Payer: Self-pay | Admitting: Internal Medicine

## 2018-05-04 NOTE — Telephone Encounter (Signed)
PATIENT SCHEDULED  °

## 2018-05-05 ENCOUNTER — Encounter (HOSPITAL_COMMUNITY): Payer: Self-pay | Admitting: Gastroenterology

## 2018-05-12 ENCOUNTER — Telehealth: Payer: Self-pay | Admitting: Gastroenterology

## 2018-05-12 NOTE — Telephone Encounter (Signed)
Please call pt. His stomach Bx shows ATROPHIC gastritis. ATROPHIC GASTRITIS MEANS THE ACID CELLS IN HIS STOMACH ARE NOT PRODUCING ENOUGH ACID SO HE CANNOT ABSORB IRON IN HIS FOOD.  PLAN: 1. HE SHOULD SEE HEMATOLOGY TO DISCUSS MANAGING HIS LOW BLOOD COUNT/ATROPHIC GASTRITIS.  2. HE SHOULD STOP THE PROTONIX.  3. HE SHOULD HAVE A CBC AFTER AUG 26(RMR ORDERING MD).  4. FOLLOW UP WITH DR. Gala Romney IN NOV 2019 E30 ATROPHIC GASTRITIS.

## 2018-05-15 NOTE — Telephone Encounter (Signed)
Spoke with nurse at pts facility. They will order CBC on 05/22/18 RMR ordering doctor, schedule with hematology, d/c Protonix. Pt was scheduled with RMR.

## 2018-05-15 NOTE — Telephone Encounter (Signed)
Patient scheduled.

## 2018-05-16 ENCOUNTER — Encounter (HOSPITAL_COMMUNITY): Payer: Self-pay | Admitting: Internal Medicine

## 2018-05-16 ENCOUNTER — Inpatient Hospital Stay (HOSPITAL_COMMUNITY): Payer: Medicare Other | Attending: Internal Medicine | Admitting: Internal Medicine

## 2018-05-16 VITALS — BP 116/53 | HR 81 | Temp 97.7°F | Resp 16

## 2018-05-16 DIAGNOSIS — Z7984 Long term (current) use of oral hypoglycemic drugs: Secondary | ICD-10-CM | POA: Insufficient documentation

## 2018-05-16 DIAGNOSIS — D500 Iron deficiency anemia secondary to blood loss (chronic): Secondary | ICD-10-CM | POA: Diagnosis not present

## 2018-05-16 DIAGNOSIS — Z79899 Other long term (current) drug therapy: Secondary | ICD-10-CM | POA: Insufficient documentation

## 2018-05-16 DIAGNOSIS — I10 Essential (primary) hypertension: Secondary | ICD-10-CM | POA: Diagnosis not present

## 2018-05-16 DIAGNOSIS — Z87891 Personal history of nicotine dependence: Secondary | ICD-10-CM | POA: Insufficient documentation

## 2018-05-16 DIAGNOSIS — E119 Type 2 diabetes mellitus without complications: Secondary | ICD-10-CM | POA: Insufficient documentation

## 2018-05-16 DIAGNOSIS — R531 Weakness: Secondary | ICD-10-CM | POA: Insufficient documentation

## 2018-05-16 NOTE — Patient Instructions (Signed)
[facility] Discharge Instructions  You saw Dr. Higgs today.   Thank you for choosing [facility] to provide your oncology and hematology care.  To afford each patient quality time with our provider, please arrive at least 15 minutes before your scheduled appointment time.   If you have a lab appointment with the Cancer Center please come in thru the  Main Entrance and check in at the main information desk  You need to re-schedule your appointment should you arrive 10 or more minutes late.  We strive to give you quality time with our providers, and arriving late affects you and other patients whose appointments are after yours.  Also, if you no show three or more times for appointments you may be dismissed from the clinic at the providers discretion.     Again, thank you for choosing [facility].  Our hope is that these requests will decrease the amount of time that you wait before being seen by our physicians.       _____________________________________________________________  Should you have questions after your visit to [facility], please contact our office at [phone] between the hours of 8:00 a.m. and 4:30 p.m.  Voicemails left after 4:00 p.m. will not be returned until the following business day.  For prescription refill requests, have your pharmacy contact our office and allow 72 hours.    Cancer Center Support Programs:   > Cancer Support Group  2nd Tuesday of the month 1pm-2pm, Journey Room    

## 2018-05-16 NOTE — Progress Notes (Signed)
Referring physician:  [facility]  Diagnosis Iron deficiency anemia due to chronic blood loss - Plan: CBC with Differential/Platelet, Comprehensive metabolic panel, Lactate dehydrogenase, Protein electrophoresis, serum, Ferritin, Hemoglobinopathy evaluation  Staging Cancer Staging No matching staging information was found for the patient.  Assessment and Plan:  1.  Iron deficiency anemia.  Patient had labs done 04/30/2018 that were reviewed and showed a hemoglobin of 6.4 with a ferritin of 3.  He had repeat blood work done on 05/02/2018 that showed a hemoglobin of 8.5.  He has undergone GI evaluation with endoscopy on 05/01/2018 that showed multiple nodules in the stomach.  Pathology was consistent with gastritis but was negative for malignancy.  He has an intolerance to oral iron.  Patient will be given a trial of IV iron with Injectafer 750 mg IV day 1 and day 8.  He will return to clinic 4 to 6 weeks after IV iron for repeat lab evaluation.  He should follow-up with GI as recommended.  2.  Multiple gastric nodules.  This was noted on endoscopy that was done 05/01/2018.  Pathology was consistent with gastritis but was negative for malignancy.  Will consider imaging if possible for further evaluation once hemoglobin has improved.  3.  Hypertension.  Blood pressure is 116/53.   Follow-up with PCP.  4.  Weakness.  Will determine if symptom improves once HB is improved after IV iron.    5.  Health maintenance.  Continue GI follow-up as recommended.    HPI: 72 year old [name] referred for evaluation of anemia.  Patient had labs done April 30, 2018 that showed a white count of 4.1 IMA globin 6.4 MCV was 70 platelets 217,000 chemistries within normal limits with a potassium of 3.6 creatinine 0.86 his ferritin was decreased at 3.  He had repeat blood work on 05/02/2018 that showed a hemoglobin of 8.5.  He is accompanied by caretaker who denies any blood in the stool of the urine.  He is undergone GI  evaluation.  FOBT testing done 04/30/2018 was positive.  Patient is seen today for consultation due to iron deficiency.  Problem List Patient Active Problem List   Diagnosis Date Noted  . Hypotension [I95.9]   . Acute blood loss anemia [D62] 05/01/2018  . Heme positive stool [R19.5] 05/01/2018  . Guaiac positive stools [R19.5]   . Iron deficiency anemia due to chronic blood loss [D50.0]   . GIB (gastrointestinal bleeding) [K92.2] 04/30/2018  . Bleeding ulcer [K27.4]   . Hypertension [I10]   . Encounter for screening colonoscopy 10/08/2011  . CLOSED DISLOCATION OF FOOT UNSPECIFIED PART [X32.355D] 01/15/2010    Past Medical History Past Medical History:  Diagnosis Date  . Bell's palsy   . Bleeding ulcer    remote past  . Dementia    disoriented to time.does not know medical hx.information obtained from previous charts  . Hyperlipemia   . Hypertension   . Obesity   . SOB (shortness of breath) on exertion     Past Surgical History Past Surgical History:  Procedure Laterality Date  . COLONOSCOPY  10/06/06   internal hemorrhoids and anal papilla/poor prep  . ESOPHAGOGASTRODUODENOSCOPY N/A 05/01/2018   Procedure: ESOPHAGOGASTRODUODENOSCOPY (EGD);  Surgeon: Danie Binder, MD;  Location: [facility]ITE;  Service: Endoscopy;  Laterality: N/A;  . FOOT SURGERY    . KNEE ARTHROSCOPY  06/17/2011   Procedure: ARTHROSCOPY KNEE;  Surgeon: Sanjuana Kava;  Location: [facility] ORS;  Service: Orthopedics;  Laterality: Right;    Family History  Family History  Problem Relation Age of Onset  . COPD Mother   . Cancer Brother   . Anesthesia problems Neg Hx   . Hypotension Neg Hx   . Malignant hyperthermia Neg Hx   . Pseudochol deficiency Neg Hx   . Colon cancer Neg Hx      Social History  reports that he has quit smoking. His smoking use included cigarettes. He has a 30.00 pack-year smoking history. He has never used smokeless tobacco. He reports that he does not drink alcohol or use  drugs.  Medications  Current Outpatient Medications:  .  acetaminophen (QC ARTHRITIS PAIN RELIEF) 650 MG CR tablet, Take 650 mg by mouth every 8 (eight) hours as needed for pain. , Disp: , Rfl:  .  Ascorbic Acid 500 MG CHEW, Chew 1 tablet by mouth daily., Disp: , Rfl:  .  atenolol (TENORMIN) 25 MG tablet, Take 1 tablet (25 mg total) by mouth daily., Disp: 30 tablet, Rfl: 0 .  atorvastatin (LIPITOR) 10 MG tablet, Take 10 mg by mouth at bedtime. , Disp: , Rfl:  .  carboxymethylcellulose (REFRESH TEARS) 0.5 % SOLN, Apply 1 drop to eye 3 (three) times daily., Disp: , Rfl:  .  cholecalciferol (VITAMIN D) 400 units TABS tablet, Take 400 Units by mouth daily., Disp: , Rfl:  .  ferrous sulfate 325 (65 FE) MG EC tablet, Take 325 mg by mouth 2 (two) times daily., Disp: , Rfl:  .  furosemide (LASIX) 40 MG tablet, Take 40 mg by mouth daily. , Disp: , Rfl:  .  ipratropium-albuterol (DUONEB) 0.5-2.5 (3) MG/3ML SOLN, Take 3 mLs by nebulization every 8 (eight) hours as needed (for shortness of breath)., Disp: , Rfl:  .  ketoconazole (NIZORAL) 2 % shampoo, Apply 1 application topically daily as needed for irritation (to scalp)., Disp: , Rfl:  .  magnesium hydroxide (MILK OF MAGNESIA) 400 MG/5ML suspension, Take 30 mLs by mouth daily as needed for mild constipation or moderate constipation. For constipation , Disp: , Rfl:  .  metFORMIN (GLUCOPHAGE) 500 MG tablet, Take 500 mg by mouth at bedtime., Disp: , Rfl:  .  mometasone (ELOCON) 0.1 % cream, Apply 1 application topically daily as needed (for dry skin)., Disp: , Rfl:  .  nystatin-triamcinolone (MYCOLOG II) cream, Apply 1 application topically daily as needed (for rash)., Disp: , Rfl:  .  pantoprazole (PROTONIX) 40 MG tablet, Take 1 tablet (40 mg total) by mouth 2 (two) times daily before a meal., Disp: 60 tablet, Rfl:  .  potassium chloride SA (K-DUR,KLOR-CON) 20 MEQ tablet, Take 20 mEq by mouth 2 (two) times daily. , Disp: , Rfl:  .  vitamin B-12  (CYANOCOBALAMIN) 1000 MCG tablet, Take 1,000 mcg by mouth daily., Disp: , Rfl:   Allergies Hydrocodone-acetaminophen  Review of Systems Review of Systems - Oncology ROS other than weakness   Physical Exam  Vitals Wt Readings from Last 3 Encounters:  04/30/18 274 lb 7.6 oz (124.5 kg)  10/07/11 230 lb (104.3 kg)  08/11/11 240 lb 4.8 oz (109 kg)   Temp Readings from Last 3 Encounters:  05/16/18 97.7 F (36.5 C) (Oral)  05/02/18 98.5 F (36.9 C) (Oral)  10/07/11 98.7 F (37.1 C) (Temporal)   BP Readings from Last 3 Encounters:  05/16/18 (!) 116/53  05/02/18 (!) 114/46  02/28/12 124/64   Pulse Readings from Last 3 Encounters:  05/16/18 81  05/02/18 70  02/28/12 70   Constitutional: Well-developed, well-nourished, and in no distress.  HENT: Head: Normocephalic and atraumatic.  Mouth/Throat: No oropharyngeal exudate. Mucosa moist. Eyes: Pupils are equal, round, and reactive to light. Conjunctivae are normal. No scleral icterus.  Neck: Normal range of motion. Neck supple. No JVD present.  Cardiovascular: Normal rate, regular rhythm and normal heart sounds.  Exam reveals no gallop and no friction rub.   No murmur heard. Pulmonary/Chest: Effort normal and breath sounds normal. No respiratory distress. No wheezes.No rales.  Abdominal: Soft. Bowel sounds are normal. No distension. There is no tenderness. There is no guarding.  Musculoskeletal: No edema or tenderness.  Lymphadenopathy: No cervical, axillary or supraclavicular adenopathy.  Neurological: Alert.  Neurological changes noted.   Skin: Skin is warm and dry. No rash noted. No erythema. No pallor.  Psychiatric: Affect and judgment normal.   Labs No visits with results within 3 Day(s) from this visit.  Latest known visit with results is:  Admission on 04/30/2018, Discharged on 05/02/2018  Component Date Value Ref Range Status  . Vitamin B-12 04/30/2018 681  180 - 914 pg/mL Final   Comment: (NOTE) This assay is not  validated for testing neonatal or myeloproliferative syndrome specimens for Vitamin B12 levels. Performed at [facility], [place] [address], [place], [place] [address]   . Folate 04/30/2018 11.1  >5.9 ng/mL Final   Performed at [facility], [place] [address] [place], [place] [address]  . Iron 04/30/2018 11* 45 - 182 ug/dL Final  . TIBC 04/30/2018 426  250 - 450 ug/dL Final  . Saturation Ratios 04/30/2018 3* 17.9 - 39.5 % Final  . UIBC 04/30/2018 415  ug/dL Final   Performed at [facility], [place] [address] [place], [place] [address]  . Ferritin 04/30/2018 3* 24 - 336 ng/mL Final   Performed at [facility], [place] [address] [place], [place] [address]  . Retic Ct Pct 04/30/2018 2.0  0.4 - 3.1 % Final  . RBC. 04/30/2018 3.29* 4.22 - 5.81 MIL/uL Final  . Retic Count, Absolute 04/30/2018 65.8  19.0 - 186.0 K/uL Final   Performed at [facility], [address], [place], [place] [address]  . WBC 04/30/2018 4.1  4.0 - 10.5 K/uL Final  . RBC 04/30/2018 3.29* 4.22 - 5.81 MIL/uL Final  . Hemoglobin 04/30/2018 6.4* 13.0 - 17.0 g/dL Final   Comment: REPEATED TO VERIFY CRITICAL RESULT CALLED TO, READ BACK BY AND VERIFIED WITH: BLACKBURN @ 1536 ON [id number] BY HENDERSON L.   . HCT 04/30/2018 23.2* 39.0 - 52.0 % Final  . MCV 04/30/2018 70.5* 78.0 - 100.0 fL Final  . MCH 04/30/2018 19.5* 26.0 - 34.0 pg Final  . MCHC 04/30/2018 27.6* 30.0 - 36.0 g/dL Final  . RDW 04/30/2018 16.0* 11.5 - 15.5 % Final  . Platelets 04/30/2018 217  150 - 400 K/uL Final  . Neutrophils Relative % 04/30/2018 47  % Final  . Lymphocytes Relative 04/30/2018 34  % Final  . Monocytes Relative 04/30/2018 14  % Final  . Eosinophils Relative 04/30/2018 4  % Final  . Basophils Relative 04/30/2018 1  % Final  . Neutro Abs 04/30/2018 1.9  1.7 - 7.7 K/uL Final  . Lymphs Abs 04/30/2018 1.4  0.7 - 4.0 K/uL Final  . Monocytes Absolute 04/30/2018 0.6  0.1 - 1.0 K/uL Final  . Eosinophils Absolute 04/30/2018 0.2  0.0  - 0.7 K/uL Final  . Basophils Absolute 04/30/2018 0.0  0.0 - 0.1 K/uL Final  . RBC Morphology 04/30/2018 ANISOCYTES  Final   Performed at [facility], [address], [place], [place] [address]  . Sodium 04/30/2018 138  135 - 145 mmol/L Final  . Potassium 04/30/2018 3.6  3.5 - 5.1 mmol/L Final  . Chloride 04/30/2018 103  98 - 111 mmol/L Final  . CO2 04/30/2018 30  22 - 32 mmol/L Final  . Glucose, Bld 04/30/2018 130* 70 - 99 mg/dL Final  . BUN 04/30/2018 15  8 - 23 mg/dL Final  . Creatinine, Ser 04/30/2018 0.86  0.61 - 1.24 mg/dL Final  . Calcium 04/30/2018 8.7* 8.9 - 10.3 mg/dL Final  . GFR calc non Af Amer 04/30/2018 >60  >60 mL/min Final  . GFR calc Af Amer 04/30/2018 >60  >60 mL/min Final   Comment: (NOTE) The eGFR has been calculated using the CKD EPI equation. This calculation has not been validated in all clinical situations. eGFR's persistently <60 mL/min signify possible Chronic Kidney Disease.   Georgiann Hahn gap 04/30/2018 5  5 - 15 Final   Performed at [facility], [address], [place], [place] [address]  . Prothrombin Time 04/30/2018 13.6  11.4 - 15.2 seconds Final  . INR 04/30/2018 1.05   Final   Performed at [facility], [address], [place], [place] [address]  . ABO/RH(D) 04/30/2018 O NEG   Final  . Antibody Screen 04/30/2018 POS   Final  . Sample Expiration 04/30/2018 05/03/2018   Final  . Antibody Identification 68/07/5725 ANTI K   Final  . Unit Number 04/30/2018    Final                   OMBTD:[id number] Performed at [facility], [place] [address], [place], [place] [address]   . Blood Component Type 04/30/2018    Final                   Value:RED CELLS,LR Performed at [facility], [place] [address], [place], [place] [address]   . Unit division 04/30/2018    Final                   Value:00 Performed at [facility], [place] [address] [place], [place] [address]   . Status of Unit 04/30/2018    Final                    Value:ISSUED,FINAL Performed at [facility], [address] [place], [place] [address]   . Donor AG Type 04/30/2018 NEGATIVE FOR KELL ANTIGEN   Final  . Transfusion Status 04/30/2018 OK TO TRANSFUSE   Final  . Crossmatch Result 04/30/2018 COMPATIBLE   Final  . Unit Number 04/30/2018 [id number]   Final  . Blood Component Type 04/30/2018 RED CELLS,LR   Final  . Unit division 04/30/2018 00   Final  . Status of Unit 04/30/2018 ISSUED,FINAL   Final  . Donor AG Type 04/30/2018 NEGATIVE FOR KELL ANTIGEN   Final  . Transfusion Status 04/30/2018 OK TO TRANSFUSE   Final  . Crossmatch Result 04/30/2018 COMPATIBLE   Final  . Fecal Occult Bld 04/30/2018 POSITIVE* NEGATIVE Final  . Lactic Acid, Venous 04/30/2018 1.4  0.5 - 1.9 mmol/L Final   Performed at [facility], [address] [place], [place] [address]  . Lactic Acid, Venous 04/30/2018 1.3  0.5 - 1.9 mmol/L Final   Performed at [facility], [address], [place], [place] [address]  . Order Confirmation 04/30/2018    Final  Value:ORDER PROCESSED BY BLOOD BANK Performed at [facility], [address] [place], [place] [address]   . ISSUE DATE / TIME 04/30/2018 [id number]   Final  . Blood Product Unit Number 04/30/2018 [id number]   Final  . PRODUCT CODE 04/30/2018 [id number]   Final  . Unit Type and Rh 04/30/2018 6200   Final  . Blood Product Expiration Date 04/30/2018 [id number]   Final  . ISSUE DATE / TIME 04/30/2018 [id number]   Final  . Blood Product Unit Number 04/30/2018 [id number]   Final  . PRODUCT CODE 04/30/2018 [id number]   Final  . Unit Type and Rh 04/30/2018 9500   Final  . Blood Product Expiration Date 04/30/2018 [id number]   Final  . WBC 05/01/2018 5.7  4.0 - 10.5 K/uL Final  . RBC 05/01/2018 3.88* 4.22 - 5.81 MIL/uL Final  . Hemoglobin 05/01/2018 8.3* 13.0 - 17.0 g/dL Final   Comment: DELTA CHECK NOTED POST TRANSFUSION SPECIMEN   . HCT 05/01/2018 28.9* 39.0 - 52.0 % Final  . MCV 05/01/2018  74.5* 78.0 - 100.0 fL Final  . MCH 05/01/2018 21.4* 26.0 - 34.0 pg Final  . MCHC 05/01/2018 28.7* 30.0 - 36.0 g/dL Final  . RDW 05/01/2018 18.1* 11.5 - 15.5 % Final  . Platelets 05/01/2018 180  150 - 400 K/uL Final   Performed at [facility], [address], [place], [place] [address]  . Sodium 05/01/2018 138  135 - 145 mmol/L Final  . Potassium 05/01/2018 4.0  3.5 - 5.1 mmol/L Final  . Chloride 05/01/2018 102  98 - 111 mmol/L Final  . CO2 05/01/2018 30  22 - 32 mmol/L Final  . Glucose, Bld 05/01/2018 101* 70 - 99 mg/dL Final  . BUN 05/01/2018 16  8 - 23 mg/dL Final  . Creatinine, Ser 05/01/2018 0.81  0.61 - 1.24 mg/dL Final  . Calcium 05/01/2018 8.4* 8.9 - 10.3 mg/dL Final  . GFR calc non Af Amer 05/01/2018 >60  >60 mL/min Final  . GFR calc Af Amer 05/01/2018 >60  >60 mL/min Final   Comment: (NOTE) The eGFR has been calculated using the CKD EPI equation. This calculation has not been validated in all clinical situations. eGFR's persistently <60 mL/min signify possible Chronic Kidney Disease.   Georgiann Hahn gap 05/01/2018 6  5 - 15 Final   Performed at [facility], [address], [place], [place] [address]  . MRSA by PCR 04/30/2018 POSITIVE* NEGATIVE Final   Comment:        The GeneXpert MRSA Assay (FDA approved for NASAL specimens only), is one component of a comprehensive MRSA colonization surveillance program. It is not intended to diagnose MRSA infection nor to guide or monitor treatment for MRSA infections. RESULT CALLED TO, READ BACK BY AND VERIFIED WITH: GRAVES,K @ 0034 ON 05/01/18 BY JUW Performed at [facility], [address], [place],  [address]   . Glucose-Capillary 04/30/2018 122* 70 - 99 mg/dL Final  . Comment 1 04/30/2018 Notify RN   Final  . Comment 2 04/30/2018 Document in Chart   Final  . Glucose-Capillary 05/01/2018 90  70 - 99 mg/dL Final  . Glucose-Capillary 05/01/2018 87  70 - 99 mg/dL Final  . Glucose-Capillary 05/01/2018 79  70 - 99 mg/dL Final  .  Comment 1 05/01/2018 Notify RN   Final  . Comment 2 05/01/2018 Document in Chart   Final  . Hgb A1c MFr Bld 05/02/2018 6.2* 4.8 - 5.6 % Final   Comment: (NOTE) Pre diabetes:  5.7%-6.4% Diabetes:              >6.4% Glycemic control for   <7.0% adults with diabetes   . Mean Plasma Glucose 05/02/2018 131.24  mg/dL Final   Performed at [place] [address], [place], [place] [address]  . Sodium 05/02/2018 139  135 - 145 mmol/L Final  . Potassium 05/02/2018 3.7  3.5 - 5.1 mmol/L Final  . Chloride 05/02/2018 105  98 - 111 mmol/L Final  . CO2 05/02/2018 28  22 - 32 mmol/L Final  . Glucose, Bld 05/02/2018 82  70 - 99 mg/dL Final  . BUN 05/02/2018 17  8 - 23 mg/dL Final  . Creatinine, Ser 05/02/2018 0.77  0.61 - 1.24 mg/dL Final  . Calcium 05/02/2018 8.5* 8.9 - 10.3 mg/dL Final  . GFR calc non Af Amer 05/02/2018 >60  >60 mL/min Final  . GFR calc Af Amer 05/02/2018 >60  >60 mL/min Final   Comment: (NOTE) The eGFR has been calculated using the CKD EPI equation. This calculation has not been validated in all clinical situations. eGFR's persistently <60 mL/min signify possible Chronic Kidney Disease.   Georgiann Hahn gap 05/02/2018 6  5 - 15 Final   Performed at [facility], [address], [place], [place] [address]  . WBC 05/02/2018 5.7  4.0 - 10.5 K/uL Final  . RBC 05/02/2018 3.83* 4.22 - 5.81 MIL/uL Final  . Hemoglobin 05/02/2018 8.5* 13.0 - 17.0 g/dL Final  . HCT 05/02/2018 28.6* 39.0 - 52.0 % Final  . MCV 05/02/2018 74.7* 78.0 - 100.0 fL Final  . MCH 05/02/2018 22.2* 26.0 - 34.0 pg Final  . MCHC 05/02/2018 29.7* 30.0 - 36.0 g/dL Final  . RDW 05/02/2018 18.8* 11.5 - 15.5 % Final  . Platelets 05/02/2018 168  150 - 400 K/uL Final   Performed at [facility], [address], [place], [place] [address]  . Magnesium 05/02/2018 2.0  1.7 - 2.4 mg/dL Final   Performed at [facility], [address], [place], [place] [address]  . Glucose-Capillary 05/01/2018 103* 70 - 99 mg/dL  Final  . Glucose-Capillary 05/02/2018 82  70 - 99 mg/dL Final  . Comment 1 05/02/2018 Notify RN   Final  . Comment 2 05/02/2018 Document in Chart   Final  . Glucose-Capillary 05/02/2018 92  70 - 99 mg/dL Final  . Comment 1 05/02/2018 Notify RN   Final  . Comment 2 05/02/2018 Document in Chart   Final     Pathology Orders Placed This Encounter  Procedures  . CBC with Differential/Platelet    Standing Status:   Future    Standing Expiration Date:   05/17/2019  . Comprehensive metabolic panel    Standing Status:   Future    Standing Expiration Date:   05/17/2019  . Lactate dehydrogenase    Standing Status:   Future    Standing Expiration Date:   05/17/2019  . Protein electrophoresis, serum    Standing Status:   Future    Standing Expiration Date:   05/17/2019  . Ferritin    Standing Status:   Future    Standing Expiration Date:   05/17/2019  . Hemoglobinopathy evaluation    Standing Status:   Future    Standing Expiration Date:   05/17/2019       Zoila Shutter MD

## 2018-05-19 ENCOUNTER — Inpatient Hospital Stay (HOSPITAL_COMMUNITY): Payer: Medicare Other

## 2018-05-19 VITALS — BP 124/53 | HR 75 | Temp 97.6°F | Resp 16

## 2018-05-19 DIAGNOSIS — Z7984 Long term (current) use of oral hypoglycemic drugs: Secondary | ICD-10-CM | POA: Diagnosis not present

## 2018-05-19 DIAGNOSIS — Z87891 Personal history of nicotine dependence: Secondary | ICD-10-CM | POA: Diagnosis not present

## 2018-05-19 DIAGNOSIS — D500 Iron deficiency anemia secondary to blood loss (chronic): Secondary | ICD-10-CM | POA: Diagnosis present

## 2018-05-19 DIAGNOSIS — E119 Type 2 diabetes mellitus without complications: Secondary | ICD-10-CM | POA: Diagnosis not present

## 2018-05-19 DIAGNOSIS — R531 Weakness: Secondary | ICD-10-CM | POA: Diagnosis not present

## 2018-05-19 DIAGNOSIS — Z79899 Other long term (current) drug therapy: Secondary | ICD-10-CM | POA: Diagnosis not present

## 2018-05-19 DIAGNOSIS — I10 Essential (primary) hypertension: Secondary | ICD-10-CM | POA: Diagnosis not present

## 2018-05-19 MED ORDER — SODIUM CHLORIDE 0.9 % IV SOLN
750.0000 mg | Freq: Once | INTRAVENOUS | Status: AC
  Administered 2018-05-19: 750 mg via INTRAVENOUS
  Filled 2018-05-19: qty 15

## 2018-05-19 MED ORDER — SODIUM CHLORIDE 0.9 % IV SOLN
Freq: Once | INTRAVENOUS | Status: AC
  Administered 2018-05-19: 15:00:00 via INTRAVENOUS

## 2018-05-19 NOTE — Patient Instructions (Signed)
[facility] at [facility]  Discharge Instructions:  You received an iron infusion today.  _______________________________________________________________  Thank you for choosing [facility] at [facility] to provide your oncology and hematology care.  To afford each patient quality time with our providers, please arrive at least 15 minutes before your scheduled appointment.  You need to re-schedule your appointment if you arrive 10 or more minutes late.  We strive to give you quality time with our providers, and arriving late affects you and other patients whose appointments are after yours.  Also, if you no show three or more times for appointments you may be dismissed from the clinic.  Again, thank you for choosing [facility]. Our hope is that these requests will allow you access to exceptional care and in a timely manner. _______________________________________________________________  If you have questions after your visit, please contact our office at [phone] between the hours of 8:30 a.m. and 5:00 p.m. Voicemails left after 4:30 p.m. will not be returned until the following business day. _______________________________________________________________  For prescription refill requests, have your pharmacy contact our office. _______________________________________________________________  Recommendations made by the consultant and any test results will be sent to your referring physician. _______________________________________________________________ 

## 2018-05-19 NOTE — Progress Notes (Signed)
Patient tolerated iron infusion with no complaints voiced.  Good blood return noted before and after administration of therapy.  Denied pain at site.  Band aid applied.  VSS with discharge and left ambulatory with no s/s of distress noted.

## 2018-05-26 ENCOUNTER — Inpatient Hospital Stay (HOSPITAL_COMMUNITY): Payer: Medicare Other

## 2018-05-26 ENCOUNTER — Encounter (HOSPITAL_COMMUNITY): Payer: Self-pay

## 2018-05-26 VITALS — BP 134/59 | HR 63 | Temp 97.8°F | Resp 18

## 2018-05-26 DIAGNOSIS — D500 Iron deficiency anemia secondary to blood loss (chronic): Secondary | ICD-10-CM | POA: Diagnosis not present

## 2018-05-26 MED ORDER — SODIUM CHLORIDE 0.9 % IV SOLN
750.0000 mg | Freq: Once | INTRAVENOUS | Status: AC
  Administered 2018-05-26: 750 mg via INTRAVENOUS
  Filled 2018-05-26: qty 15

## 2018-05-26 MED ORDER — SODIUM CHLORIDE 0.9 % IV SOLN
Freq: Once | INTRAVENOUS | Status: AC
  Administered 2018-05-26: 14:00:00 via INTRAVENOUS

## 2018-05-26 NOTE — Progress Notes (Signed)
Lowell Bouton tolerated Injectafer infusion well without complaints or incident. VSS upon discharge. Pt discharged via wheelchair in satisfactory condition accompanied by caregiver

## 2018-05-26 NOTE — Patient Instructions (Signed)
[facility] Discharge Instructions  Received Iron infusion today. Follow-up as scheduled. Call clinic for any questions or concerns   Thank you for choosing [facility] to provide your oncology and hematology care.  To afford each patient quality time with our provider, please arrive at least 15 minutes before your scheduled appointment time.   If you have a lab appointment with the [facility] please come in thru the  Main Entrance and check in at the main information desk  You need to re-schedule your appointment should you arrive 10 or more minutes late.  We strive to give you quality time with our providers, and arriving late affects you and other patients whose appointments are after yours.  Also, if you no show three or more times for appointments you may be dismissed from the clinic at the providers discretion.     Again, thank you for choosing [facility].  Our hope is that these requests will decrease the amount of time that you wait before being seen by our physicians.       _____________________________________________________________  Should you have questions after your visit to [facility], please contact our office at [phone] between the hours of 8:00 a.m. and 4:30 p.m.  Voicemails left after 4:00 p.m. will not be returned until the following business day.  For prescription refill requests, have your pharmacy contact our office and allow 72 hours.    Cancer Center Support Programs:   > Cancer Support Group  2nd Tuesday of the month 1pm-2pm, Journey Room

## 2018-07-03 ENCOUNTER — Ambulatory Visit (INDEPENDENT_AMBULATORY_CARE_PROVIDER_SITE_OTHER): Payer: Medicare Other | Admitting: Otolaryngology

## 2018-07-03 DIAGNOSIS — H6123 Impacted cerumen, bilateral: Secondary | ICD-10-CM | POA: Diagnosis not present

## 2018-07-21 ENCOUNTER — Encounter (HOSPITAL_COMMUNITY): Payer: Self-pay | Admitting: Internal Medicine

## 2018-07-21 ENCOUNTER — Other Ambulatory Visit: Payer: Self-pay

## 2018-07-21 ENCOUNTER — Inpatient Hospital Stay (HOSPITAL_COMMUNITY): Payer: Medicare Other | Attending: Internal Medicine | Admitting: Internal Medicine

## 2018-07-21 VITALS — BP 125/59 | HR 80 | Temp 97.7°F | Resp 20 | Wt 269.0 lb

## 2018-07-21 DIAGNOSIS — D500 Iron deficiency anemia secondary to blood loss (chronic): Secondary | ICD-10-CM

## 2018-07-21 DIAGNOSIS — Z79899 Other long term (current) drug therapy: Secondary | ICD-10-CM | POA: Diagnosis not present

## 2018-07-21 DIAGNOSIS — D508 Other iron deficiency anemias: Secondary | ICD-10-CM | POA: Diagnosis present

## 2018-07-21 DIAGNOSIS — Z87891 Personal history of nicotine dependence: Secondary | ICD-10-CM | POA: Insufficient documentation

## 2018-07-21 DIAGNOSIS — I10 Essential (primary) hypertension: Secondary | ICD-10-CM | POA: Diagnosis not present

## 2018-07-21 NOTE — Patient Instructions (Signed)
[facility] Discharge Instructions  Today you saw Dr. Higgs   Thank you for choosing [facility] at [facility] to provide your oncology and hematology care.  To afford each patient quality time with our provider, please arrive at least 15 minutes before your scheduled appointment time.   If you have a lab appointment with the Cancer Center please come in thru the  Main Entrance and check in at the main information desk  You need to re-schedule your appointment should you arrive 10 or more minutes late.  We strive to give you quality time with our providers, and arriving late affects you and other patients whose appointments are after yours.  Also, if you no show three or more times for appointments you may be dismissed from the clinic at the providers discretion.     Again, thank you for choosing [facility].  Our hope is that these requests will decrease the amount of time that you wait before being seen by our physicians.       _____________________________________________________________  Should you have questions after your visit to  Cancer Center, please contact our office at [phone] between the hours of 8:00 a.m. and 4:30 p.m.  Voicemails left after 4:00 p.m. will not be returned until the following business day.  For prescription refill requests, have your pharmacy contact our office and allow 72 hours.    Cancer Center Support Programs:   > Cancer Support Group  2nd Tuesday of the month 1pm-2pm, Journey Room   

## 2018-07-21 NOTE — Progress Notes (Signed)
Diagnosis Iron deficiency anemia due to chronic blood loss - Plan: CBC with Differential/Platelet, Comprehensive metabolic panel, Lactate dehydrogenase, Ferritin  Staging Cancer Staging No matching staging information was found for the patient.  Assessment and Plan:   1.  Iron deficiency anemia.  Patient had labs done 04/30/2018 that was reviewed and showed a hemoglobin of 6.4 with a ferritin of 3.  He had repeat blood work done on 05/02/2018 that showed a hemoglobin of 8.5.  He has undergone GI evaluation with endoscopy on 05/01/2018 that showed multiple nodules in the stomach.  Pathology was consistent with gastritis but was negative for malignancy.  He has an intolerance to oral iron.    Patient was treated with IV iron on 05/26/2018.  Outside labs done 05/23/2018 reviewed and showed WBC 4.9 HB 10.8 and plts 221,000.  HB is improved after IV iron.  He will have repeat labs and follow-up in 08/2018.   He should follow-up with GI as recommended.  2.  Multiple gastric nodules.  This was noted on endoscopy that was done 05/01/2018.  Pathology was consistent with gastritis but was negative for malignancy.  Will consider imaging if possible for further evaluation if HB remains improved on RTC.    3.  Hypertension.  Blood pressure is 125/59.   Follow-up with PCP.  4.  Weakness.  Pt reports energy level is improved after IV iron.    5.  Health maintenance.  GI follow-up as recommended.     Current Status:  Pt is seen today for follow-up after IV iron.  He bring in outside labs.     Problem List Patient Active Problem List   Diagnosis Date Noted  . Hypotension [I95.9]   . Acute blood loss anemia [D62] 05/01/2018  . Heme positive stool [R19.5] 05/01/2018  . Guaiac positive stools [R19.5]   . Iron deficiency anemia due to chronic blood loss [D50.0]   . GIB (gastrointestinal bleeding) [K92.2] 04/30/2018  . Bleeding ulcer [K27.4]   . Hypertension [I10]   . Encounter for screening colonoscopy  10/08/2011  . CLOSED DISLOCATION OF FOOT UNSPECIFIED PART [D40.814G] 01/15/2010    Past Medical History Past Medical History:  Diagnosis Date  . Bell's palsy   . Bleeding ulcer    remote past  . Dementia    disoriented to time.does not know medical hx.information obtained from previous charts  . Hyperlipemia   . Hypertension   . Obesity   . SOB (shortness of breath) on exertion     Past Surgical History Past Surgical History:  Procedure Laterality Date  . COLONOSCOPY  10/06/06   internal hemorrhoids and anal papilla/poor prep  . ESOPHAGOGASTRODUODENOSCOPY N/A 05/01/2018   Procedure: ESOPHAGOGASTRODUODENOSCOPY (EGD);  Surgeon: Danie Binder, MD;  Location: [facility] ENDO SUITE;  Service: Endoscopy;  Laterality: N/A;  . FOOT SURGERY    . KNEE ARTHROSCOPY  06/17/2011   Procedure: ARTHROSCOPY KNEE;  Surgeon: Sanjuana Kava;  Location: [facility] ORS;  Service: Orthopedics;  Laterality: Right;    Family History Family History  Problem Relation Age of Onset  . COPD Mother   . Cancer Brother   . Anesthesia problems Neg Hx   . Hypotension Neg Hx   . Malignant hyperthermia Neg Hx   . Pseudochol deficiency Neg Hx   . Colon cancer Neg Hx      Social History  reports that he has quit smoking. His smoking use included cigarettes. He has a 30.00 pack-year smoking history. He has never used smokeless tobacco. He  reports that he does not drink alcohol or use drugs.  Medications  Current Outpatient Medications:  .  acetaminophen (QC ARTHRITIS PAIN RELIEF) 650 MG CR tablet, Take 650 mg by mouth every 8 (eight) hours as needed for pain. , Disp: , Rfl:  .  Ascorbic Acid 500 MG CHEW, Chew 1 tablet by mouth daily., Disp: , Rfl:  .  atenolol (TENORMIN) 25 MG tablet, Take 1 tablet (25 mg total) by mouth daily., Disp: 30 tablet, Rfl: 0 .  atorvastatin (LIPITOR) 10 MG tablet, Take 10 mg by mouth at bedtime. , Disp: , Rfl:  .  carboxymethylcellulose (REFRESH TEARS) 0.5 % SOLN, Apply 1 drop to eye 3 (three)  times daily., Disp: , Rfl:  .  cholecalciferol (VITAMIN D) 400 units TABS tablet, Take 400 Units by mouth daily., Disp: , Rfl:  .  ferrous sulfate 325 (65 FE) MG EC tablet, Take 325 mg by mouth 2 (two) times daily., Disp: , Rfl:  .  furosemide (LASIX) 40 MG tablet, Take 40 mg by mouth daily. , Disp: , Rfl:  .  ipratropium-albuterol (DUONEB) 0.5-2.5 (3) MG/3ML SOLN, Take 3 mLs by nebulization every 8 (eight) hours as needed (for shortness of breath)., Disp: , Rfl:  .  ketoconazole (NIZORAL) 2 % shampoo, Apply 1 application topically daily as needed for irritation (to scalp)., Disp: , Rfl:  .  magnesium hydroxide (MILK OF MAGNESIA) 400 MG/5ML suspension, Take 30 mLs by mouth daily as needed for mild constipation or moderate constipation. For constipation , Disp: , Rfl:  .  metFORMIN (GLUCOPHAGE) 500 MG tablet, Take 500 mg by mouth at bedtime., Disp: , Rfl:  .  mometasone (ELOCON) 0.1 % cream, Apply 1 application topically daily as needed (for dry skin)., Disp: , Rfl:  .  nystatin-triamcinolone (MYCOLOG II) cream, Apply 1 application topically daily as needed (for rash)., Disp: , Rfl:  .  potassium chloride SA (K-DUR,KLOR-CON) 20 MEQ tablet, Take 20 mEq by mouth 2 (two) times daily. , Disp: , Rfl:  .  vitamin B-12 (CYANOCOBALAMIN) 1000 MCG tablet, Take 1,000 mcg by mouth daily., Disp: , Rfl:   Allergies Hydrocodone-acetaminophen  Review of Systems Review of Systems - Oncology ROS negative other than energy level improved   Physical Exam  Vitals Wt Readings from Last 3 Encounters:  07/21/18 269 lb (122 kg)  04/30/18 274 lb 7.6 oz (124.5 kg)  10/07/11 230 lb (104.3 kg)   Temp Readings from Last 3 Encounters:  07/21/18 97.7 F (36.5 C) (Oral)  05/26/18 97.8 F (36.6 C) (Oral)  05/19/18 97.6 F (36.4 C) (Oral)   BP Readings from Last 3 Encounters:  07/21/18 (!) 125/59  05/26/18 (!) 134/59  05/19/18 (!) 124/53   Pulse Readings from Last 3 Encounters:  07/21/18 80  05/26/18 63   05/19/18 75    Constitutional: Well-developed, well-nourished, and in no distress.   HENT: Head: Normocephalic and atraumatic.  Mouth/Throat: No oropharyngeal exudate. Mucosa moist. Eyes: Pupils are equal, round, and reactive to light. Conjunctivae are normal. No scleral icterus.  Neck: Normal range of motion. Neck supple. No JVD present.  Cardiovascular: Normal rate, regular rhythm and normal heart sounds.  Exam reveals no gallop and no friction rub.   No murmur heard. Pulmonary/Chest: Effort normal and breath sounds normal. No respiratory distress. No wheezes.No rales.  Abdominal: Soft. Bowel sounds are normal. Obese, There is no tenderness. There is no guarding.  Musculoskeletal: No edema or tenderness.  Lymphadenopathy: No cervical, axillary or supraclavicular adenopathy.  Neurological: Alert and oriented to person, place, and time. No cranial nerve deficit.  Skin: Skin is warm and dry. No rash noted. No erythema. No pallor.  Psychiatric: Affect and judgment normal.   Labs No visits with results within 3 Day(s) from this visit.  Latest known visit with results is:  Admission on 04/30/2018, Discharged on 05/02/2018  Component Date Value Ref Range Status  . Vitamin B-12 04/30/2018 681  180 - 914 pg/mL Final   Comment: (NOTE) This assay is not validated for testing neonatal or myeloproliferative syndrome specimens for Vitamin B12 levels. Performed at [facility], [place] [address], [place], [place] [address]   . Folate 04/30/2018 11.1  >5.9 ng/mL Final   Performed at [facility], [place] [address], [place], [place] [address]  . Iron 04/30/2018 11* 45 - 182 ug/dL Final  . TIBC 04/30/2018 426  250 - 450 ug/dL Final  . Saturation Ratios 04/30/2018 3* 17.9 - 39.5 % Final  . UIBC 04/30/2018 415  ug/dL Final   Performed at [facility], [place] [address] [place], [place] [address]  . Ferritin 04/30/2018 3* 24 - 336 ng/mL Final   Performed at [facility],  [place] [address], [place], [place] [address]  . Retic Ct Pct 04/30/2018 2.0  0.4 - 3.1 % Final  . RBC. 04/30/2018 3.29* 4.22 - 5.81 MIL/uL Final  . Retic Count, Absolute 04/30/2018 65.8  19.0 - 186.0 K/uL Final   Performed at [facility], [address] [place], [place] [address]  . WBC 04/30/2018 4.1  4.0 - 10.5 K/uL Final  . RBC 04/30/2018 3.29* 4.22 - 5.81 MIL/uL Final  . Hemoglobin 04/30/2018 6.4* 13.0 - 17.0 g/dL Final   Comment: REPEATED TO VERIFY CRITICAL RESULT CALLED TO, READ BACK BY AND VERIFIED WITH: BLACK[place]URN @ 1536 ON [id number] BY HENDERSON L.   . HCT 04/30/2018 23.2* 39.0 - 52.0 % Final  . MCV 04/30/2018 70.5* 78.0 - 100.0 fL Final  . MCH 04/30/2018 19.5* 26.0 - 34.0 pg Final  . MCHC 04/30/2018 27.6* 30.0 - 36.0 g/dL Final  . RDW 04/30/2018 16.0* 11.5 - 15.5 % Final  . Platelets 04/30/2018 217  150 - 400 K/uL Final  . Neutrophils Relative % 04/30/2018 47  % Final  . Lymphocytes Relative 04/30/2018 34  % Final  . Monocytes Relative 04/30/2018 14  % Final  . Eosinophils Relative 04/30/2018 4  % Final  . Basophils Relative 04/30/2018 1  % Final  . Neutro Abs 04/30/2018 1.9  1.7 - 7.7 K/uL Final  . Lymphs Abs 04/30/2018 1.4  0.7 - 4.0 K/uL Final  . Monocytes Absolute 04/30/2018 0.6  0.1 - 1.0 K/uL Final  . Eosinophils Absolute 04/30/2018 0.2  0.0 - 0.7 K/uL Final  . Basophils Absolute 04/30/2018 0.0  0.0 - 0.1 K/uL Final  . RBC Morphology 04/30/2018 ANISOCYTES   Final   Performed at [facility], [address], [place],  [address]  . Sodium 04/30/2018 138  135 - 145 mmol/L Final  . Potassium 04/30/2018 3.6  3.5 - 5.1 mmol/L Final  . Chloride 04/30/2018 103  98 - 111 mmol/L Final  . CO2 04/30/2018 30  22 - 32 mmol/L Final  . Glucose, Bld 04/30/2018 130* 70 - 99 mg/dL Final  . BUN 04/30/2018 15  8 - 23 mg/dL Final  . Creatinine, Ser 04/30/2018 0.86  0.61 - 1.24 mg/dL Final  . Calcium 04/30/2018 8.7* 8.9 - 10.3 mg/dL Final  .  GFR calc non Af Amer 04/30/2018 >60  >60  mL/min Final  . GFR calc Af Amer 04/30/2018 >60  >60 mL/min Final   Comment: (NOTE) The eGFR has been calculated using the CKD EPI equation. This calculation has not been validated in all clinical situations. eGFR's persistently <60 mL/min signify possible Chronic Kidney Disease.   Georgiann Hahn gap 04/30/2018 5  5 - 15 Final   Performed at [facility], [address], [place], [place] [address]  . Prothrombin Time 04/30/2018 13.6  11.4 - 15.2 seconds Final  . INR 04/30/2018 1.05   Final   Performed at [facility], [address] [place], [place] [address]  . ABO/RH(D) 04/30/2018 O NEG   Final  . Antibody Screen 04/30/2018 POS   Final  . Sample Expiration 04/30/2018 05/03/2018   Final  . Antibody Identification 33/35/4562 ANTI K   Final  . Unit Number 04/30/2018    Final                   BWLSL:[id number] Performed at [facility], [place] [address] [place], [place] [address]   . Blood Component Type 04/30/2018    Final                   Value:RED CELLS,LR Performed at [facility], [place] [address], [place], [place] [address]   . Unit division 04/30/2018    Final                   Value:00 Performed at [facility], [place] [address], [place], [place] [address]   . Status of Unit 04/30/2018    Final                   Value:ISSUED,FINAL Performed at [facility], [address] [place], [place] [address]   . Donor AG Type 04/30/2018 NEGATIVE FOR KELL ANTIGEN   Final  . Transfusion Status 04/30/2018 OK TO TRANSFUSE   Final  . Crossmatch Result 04/30/2018 COMPATIBLE   Final  . Unit Number 04/30/2018 [id number]   Final  . Blood Component Type 04/30/2018 RED CELLS,LR   Final  . Unit division 04/30/2018 00   Final  . Status of Unit 04/30/2018 ISSUED,FINAL   Final  . Donor AG Type 04/30/2018 NEGATIVE FOR KELL ANTIGEN   Final  . Transfusion Status 04/30/2018 OK TO TRANSFUSE   Final  . Crossmatch Result 04/30/2018 COMPATIBLE   Final  . Fecal Occult Bld 04/30/2018  POSITIVE* NEGATIVE Final  . Lactic Acid, Venous 04/30/2018 1.4  0.5 - 1.9 mmol/L Final   Performed at [facility], [address], [place], [place] [address]  . Lactic Acid, Venous 04/30/2018 1.3  0.5 - 1.9 mmol/L Final   Performed at [facility], [address], [place], [place] [address]  . Order Confirmation 04/30/2018    Final                   Value:ORDER PROCESSED BY BLOOD BANK Performed at [facility], [address] [place], [place] [address]   . ISSUE DATE / TIME 04/30/2018 [id number]   Final  . Blood Product Unit Number 04/30/2018 [id number]   Final  . PRODUCT CODE 04/30/2018 [id number]   Final  . Unit Type and Rh 04/30/2018 6200   Final  . Blood Product Expiration Date 04/30/2018 [id number]   Final  . ISSUE DATE / TIME 04/30/2018 [id number]   Final  . Blood Product  Unit Number 04/30/2018 [id number]   Final  . PRODUCT CODE 04/30/2018 [id number]   Final  . Unit Type and Rh 04/30/2018 9500   Final  . Blood Product Expiration Date 04/30/2018 [id number]   Final  . WBC 05/01/2018 5.7  4.0 - 10.5 K/uL Final  . RBC 05/01/2018 3.88* 4.22 - 5.81 MIL/uL Final  . Hemoglobin 05/01/2018 8.3* 13.0 - 17.0 g/dL Final   Comment: DELTA CHECK NOTED POST TRANSFUSION SPECIMEN   . HCT 05/01/2018 28.9* 39.0 - 52.0 % Final  . MCV 05/01/2018 74.5* 78.0 - 100.0 fL Final  . MCH 05/01/2018 21.4* 26.0 - 34.0 pg Final  . MCHC 05/01/2018 28.7* 30.0 - 36.0 g/dL Final  . RDW 05/01/2018 18.1* 11.5 - 15.5 % Final  . Platelets 05/01/2018 180  150 - 400 K/uL Final   Performed at [facility], [address], [place], [place] [address]  . Sodium 05/01/2018 138  135 - 145 mmol/L Final  . Potassium 05/01/2018 4.0  3.5 - 5.1 mmol/L Final  . Chloride 05/01/2018 102  98 - 111 mmol/L Final  . CO2 05/01/2018 30  22 - 32 mmol/L Final  . Glucose, Bld 05/01/2018 101* 70 - 99 mg/dL Final  . BUN 05/01/2018 16  8 - 23 mg/dL Final  . Creatinine, Ser 05/01/2018 0.81  0.61 - 1.24 mg/dL Final  . Calcium  05/01/2018 8.4* 8.9 - 10.3 mg/dL Final  . GFR calc non Af Amer 05/01/2018 >60  >60 mL/min Final  . GFR calc Af Amer 05/01/2018 >60  >60 mL/min Final   Comment: (NOTE) The eGFR has been calculated using the CKD EPI equation. This calculation has not been validated in all clinical situations. eGFR's persistently <60 mL/min signify possible Chronic Kidney Disease.   Georgiann Hahn gap 05/01/2018 6  5 - 15 Final   Performed at [facility], [address], [place], [place] [address]  . MRSA by PCR 04/30/2018 POSITIVE* NEGATIVE Final   Comment:        The GeneXpert MRSA Assay (FDA approved for NASAL specimens only), is one component of a comprehensive MRSA colonization surveillance program. It is not intended to diagnose MRSA infection nor to guide or monitor treatment for MRSA infections. RESULT CALLED TO, READ BACK BY AND VERIFIED WITH: GRAVES,K @ 0034 ON 05/01/18 BY JUW Performed at [facility], [address], [place], [place] [address]   . Glucose-Capillary 04/30/2018 122* 70 - 99 mg/dL Final  . Comment 1 04/30/2018 Notify RN   Final  . Comment 2 04/30/2018 Document in Chart   Final  . Glucose-Capillary 05/01/2018 90  70 - 99 mg/dL Final  . Glucose-Capillary 05/01/2018 87  70 - 99 mg/dL Final  . Glucose-Capillary 05/01/2018 79  70 - 99 mg/dL Final  . Comment 1 05/01/2018 Notify RN   Final  . Comment 2 05/01/2018 Document in Chart   Final  . Hgb A1c MFr Bld 05/02/2018 6.2* 4.8 - 5.6 % Final   Comment: (NOTE) Pre diabetes:          5.7%-6.4% Diabetes:              >6.4% Glycemic control for   <7.0% adults with diabetes   . Mean Plasma Glucose 05/02/2018 131.24  mg/dL Final   Performed at [place] [address], [place], [place] [address]  . Sodium 05/02/2018 139  135 - 145 mmol/L Final  . Potassium 05/02/2018 3.7  3.5 - 5.1 mmol/L Final  . Chloride 05/02/2018 105  98 - 111 mmol/L Final  . CO2 05/02/2018 28  22 - 32 mmol/L Final  . Glucose, Bld 05/02/2018 82  70 - 99  mg/dL Final  . BUN 05/02/2018 17  8 - 23 mg/dL Final  . Creatinine, Ser 05/02/2018 0.77  0.61 - 1.24 mg/dL Final  . Calcium 05/02/2018 8.5* 8.9 - 10.3 mg/dL Final  . GFR calc non Af Amer 05/02/2018 >60  >60 mL/min Final  . GFR calc Af Amer 05/02/2018 >60  >60 mL/min Final   Comment: (NOTE) The eGFR has been calculated using the CKD EPI equation. This calculation has not been validated in all clinical situations. eGFR's persistently <60 mL/min signify possible Chronic Kidney Disease.   Georgiann Hahn gap 05/02/2018 6  5 - 15 Final   Performed at [facility], [address], [place], [place] [address]  . WBC 05/02/2018 5.7  4.0 - 10.5 K/uL Final  . RBC 05/02/2018 3.83* 4.22 - 5.81 MIL/uL Final  . Hemoglobin 05/02/2018 8.5* 13.0 - 17.0 g/dL Final  . HCT 05/02/2018 28.6* 39.0 - 52.0 % Final  . MCV 05/02/2018 74.7* 78.0 - 100.0 fL Final  . MCH 05/02/2018 22.2* 26.0 - 34.0 pg Final  . MCHC 05/02/2018 29.7* 30.0 - 36.0 g/dL Final  . RDW 05/02/2018 18.8* 11.5 - 15.5 % Final  . Platelets 05/02/2018 168  150 - 400 K/uL Final   Performed at [facility], [address], [place], [place] [address]  . Magnesium 05/02/2018 2.0  1.7 - 2.4 mg/dL Final   Performed at [facility], [address] [place], [place] [address]  . Glucose-Capillary 05/01/2018 103* 70 - 99 mg/dL Final  . Glucose-Capillary 05/02/2018 82  70 - 99 mg/dL Final  . Comment 1 05/02/2018 Notify RN   Final  . Comment 2 05/02/2018 Document in Chart   Final  . Glucose-Capillary 05/02/2018 92  70 - 99 mg/dL Final  . Comment 1 05/02/2018 Notify RN   Final  . Comment 2 05/02/2018 Document in Chart   Final     Pathology Orders Placed This Encounter  Procedures  . CBC with Differential/Platelet    Standing Status:   Future    Standing Expiration Date:   07/22/2019  . Comprehensive metabolic panel    Standing Status:   Future    Standing Expiration Date:   07/22/2019  . Lactate dehydrogenase    Standing Status:   Future    Standing  Expiration Date:   07/22/2019  . Ferritin    Standing Status:   Future    Standing Expiration Date:   07/22/2019       Zoila Shutter MD

## 2018-07-28 DIAGNOSIS — F70 Mild intellectual disabilities: Secondary | ICD-10-CM | POA: Diagnosis not present

## 2018-07-28 DIAGNOSIS — R278 Other lack of coordination: Secondary | ICD-10-CM | POA: Diagnosis not present

## 2018-07-28 DIAGNOSIS — R293 Abnormal posture: Secondary | ICD-10-CM | POA: Diagnosis not present

## 2018-07-28 DIAGNOSIS — M5136 Other intervertebral disc degeneration, lumbar region: Secondary | ICD-10-CM | POA: Diagnosis not present

## 2018-07-31 DIAGNOSIS — R278 Other lack of coordination: Secondary | ICD-10-CM | POA: Diagnosis not present

## 2018-07-31 DIAGNOSIS — F70 Mild intellectual disabilities: Secondary | ICD-10-CM | POA: Diagnosis not present

## 2018-07-31 DIAGNOSIS — M5136 Other intervertebral disc degeneration, lumbar region: Secondary | ICD-10-CM | POA: Diagnosis not present

## 2018-07-31 DIAGNOSIS — R293 Abnormal posture: Secondary | ICD-10-CM | POA: Diagnosis not present

## 2018-08-01 DIAGNOSIS — R278 Other lack of coordination: Secondary | ICD-10-CM | POA: Diagnosis not present

## 2018-08-01 DIAGNOSIS — M5136 Other intervertebral disc degeneration, lumbar region: Secondary | ICD-10-CM | POA: Diagnosis not present

## 2018-08-01 DIAGNOSIS — F70 Mild intellectual disabilities: Secondary | ICD-10-CM | POA: Diagnosis not present

## 2018-08-01 DIAGNOSIS — R293 Abnormal posture: Secondary | ICD-10-CM | POA: Diagnosis not present

## 2018-08-02 DIAGNOSIS — M5136 Other intervertebral disc degeneration, lumbar region: Secondary | ICD-10-CM | POA: Diagnosis not present

## 2018-08-02 DIAGNOSIS — F70 Mild intellectual disabilities: Secondary | ICD-10-CM | POA: Diagnosis not present

## 2018-08-02 DIAGNOSIS — R278 Other lack of coordination: Secondary | ICD-10-CM | POA: Diagnosis not present

## 2018-08-02 DIAGNOSIS — R293 Abnormal posture: Secondary | ICD-10-CM | POA: Diagnosis not present

## 2018-08-03 DIAGNOSIS — R278 Other lack of coordination: Secondary | ICD-10-CM | POA: Diagnosis not present

## 2018-08-03 DIAGNOSIS — F70 Mild intellectual disabilities: Secondary | ICD-10-CM | POA: Diagnosis not present

## 2018-08-03 DIAGNOSIS — R293 Abnormal posture: Secondary | ICD-10-CM | POA: Diagnosis not present

## 2018-08-03 DIAGNOSIS — L97423 Non-pressure chronic ulcer of left heel and midfoot with necrosis of muscle: Secondary | ICD-10-CM | POA: Diagnosis not present

## 2018-08-03 DIAGNOSIS — M5136 Other intervertebral disc degeneration, lumbar region: Secondary | ICD-10-CM | POA: Diagnosis not present

## 2018-08-04 DIAGNOSIS — R293 Abnormal posture: Secondary | ICD-10-CM | POA: Diagnosis not present

## 2018-08-04 DIAGNOSIS — R278 Other lack of coordination: Secondary | ICD-10-CM | POA: Diagnosis not present

## 2018-08-04 DIAGNOSIS — M5136 Other intervertebral disc degeneration, lumbar region: Secondary | ICD-10-CM | POA: Diagnosis not present

## 2018-08-04 DIAGNOSIS — F70 Mild intellectual disabilities: Secondary | ICD-10-CM | POA: Diagnosis not present

## 2018-08-07 DIAGNOSIS — F70 Mild intellectual disabilities: Secondary | ICD-10-CM | POA: Diagnosis not present

## 2018-08-07 DIAGNOSIS — R278 Other lack of coordination: Secondary | ICD-10-CM | POA: Diagnosis not present

## 2018-08-07 DIAGNOSIS — M5136 Other intervertebral disc degeneration, lumbar region: Secondary | ICD-10-CM | POA: Diagnosis not present

## 2018-08-07 DIAGNOSIS — R293 Abnormal posture: Secondary | ICD-10-CM | POA: Diagnosis not present

## 2018-08-08 ENCOUNTER — Encounter: Payer: Self-pay | Admitting: Gastroenterology

## 2018-08-08 ENCOUNTER — Ambulatory Visit (INDEPENDENT_AMBULATORY_CARE_PROVIDER_SITE_OTHER): Payer: Medicare Other | Admitting: Gastroenterology

## 2018-08-08 VITALS — BP 141/80 | HR 82 | Temp 97.1°F | Ht 67.0 in | Wt 265.2 lb

## 2018-08-08 DIAGNOSIS — F70 Mild intellectual disabilities: Secondary | ICD-10-CM | POA: Diagnosis not present

## 2018-08-08 DIAGNOSIS — R293 Abnormal posture: Secondary | ICD-10-CM | POA: Diagnosis not present

## 2018-08-08 DIAGNOSIS — D500 Iron deficiency anemia secondary to blood loss (chronic): Secondary | ICD-10-CM | POA: Diagnosis not present

## 2018-08-08 DIAGNOSIS — R278 Other lack of coordination: Secondary | ICD-10-CM | POA: Diagnosis not present

## 2018-08-08 DIAGNOSIS — M5136 Other intervertebral disc degeneration, lumbar region: Secondary | ICD-10-CM | POA: Diagnosis not present

## 2018-08-08 NOTE — Patient Instructions (Signed)
1.  Continue to follow-up with hematology, Dr. Walden Field regarding iron deficiency anemia.  If you have decline in her hemoglobin or ferritin, may need to reevaluate possibility of pursuing colonoscopy 2. Return office visit here with Dr. Gala Romney in 3 months.

## 2018-08-08 NOTE — Progress Notes (Signed)
Primary Care Physician: Caprice Renshaw, MD  Primary Gastroenterologist:  Garfield Cornea, MD   Chief Complaint  Patient presents with  . ATROPHIC GASTRITIS    HFU.    HPI: [name] is a 72 y.o. male here for hospital follow-up.  We saw him in August when he presented to [facility] with a hemoglobin of 6.4, ferritin 3, Hemoccult positive stool.  No overt GI bleeding noted or reported.  Patient has a history of cognitive deficits has resided in assisted living or skilled nursing facility since his parent's deaths.  His power of attorney is DSS, Glennie Hawk (cell phone [phone]).  While he was in the hospital he agreed to EGD for diagnostic purposes.  Patient did not want to pursue a colonoscopy.  EGD showed atrophic gastritis.  His PPI was stopped.  His last colonoscopy was in 2008, he had internal hemorrhoids, poor prep.    He received 2 iron infusions in August.  Labs drawn at the nursing facility on July 19, 2018 showed white blood cell count 5400, hemoglobin 13.2, hematocrit 39.2, MCV 89.7.  Platelets 149,000.  Labs on August 27 with a hemoglobin of 10.8, hematocrit 35, MCV 78.8.  Patient presents today with nonclinical staff from nursing facility.  Patient knows who he is.  He does not know his date of birth, month, president.  He states he is feeling well.  Denies any abdominal pain.  Appetite is good.  There has been no reported melena or rectal bleeding   Current Outpatient Medications  Medication Sig Dispense Refill  . acetaminophen (QC ARTHRITIS PAIN RELIEF) 650 MG CR tablet Take 650 mg by mouth every 8 (eight) hours as needed for pain.     . Ascorbic Acid 500 MG CHEW Chew 1 tablet by mouth daily.    Marland Kitchen atenolol (TENORMIN) 25 MG tablet Take 1 tablet (25 mg total) by mouth daily. 30 tablet 0  . atorvastatin (LIPITOR) 10 MG tablet Take 10 mg by mouth at bedtime.     . cadexomer iodine (IODOSORB) 0.9 % gel Apply 1 application topically. Apply to left plantar  foot topically every shift to promote wound healing    . carboxymethylcellulose (REFRESH TEARS) 0.5 % SOLN Apply 1 drop to eye 2 (two) times daily.     . cholecalciferol (VITAMIN D) 400 units TABS tablet Take 400 Units by mouth daily.    . ferrous sulfate 325 (65 FE) MG EC tablet Take 325 mg by mouth 2 (two) times daily.    . furosemide (LASIX) 40 MG tablet Take 40 mg by mouth daily.     Marland Kitchen ipratropium-albuterol (DUONEB) 0.5-2.5 (3) MG/3ML SOLN Take 3 mLs by nebulization every 8 (eight) hours as needed (for shortness of breath).    Marland Kitchen ketoconazole (NIZORAL) 2 % shampoo Apply 1 application topically daily as needed for irritation (to scalp).    . magnesium hydroxide (MILK OF MAGNESIA) 400 MG/5ML suspension Take 30 mLs by mouth daily as needed for mild constipation or moderate constipation. For constipation     . metFORMIN (GLUCOPHAGE) 500 MG tablet Take 500 mg by mouth at bedtime.    . mometasone (ELOCON) 0.1 % cream Apply 1 application topically daily as needed (for dry skin).    . nystatin (MYCOSTATIN/NYSTOP) powder Apply topically 2 (two) times daily. Apply to groin and penis topically every day and evening shift to promote skin healing    . nystatin-triamcinolone (MYCOLOG II) cream Apply 1 application topically daily as  needed (for rash).    . potassium chloride SA (K-DUR,KLOR-CON) 20 MEQ tablet Take 20 mEq by mouth 2 (two) times daily.     . vitamin B-12 (CYANOCOBALAMIN) 1000 MCG tablet Take 1,000 mcg by mouth daily.     No current facility-administered medications for this visit.     Allergies as of 08/08/2018 - Review Complete 08/08/2018  Allergen Reaction Noted  . Hydrocodone-acetaminophen  06/17/2011    ROS: Unreliable  Physical Examination:   BP (!) 141/80   Pulse 82   Temp (!) 97.1 F (36.2 C) (Oral)   Ht 5\' 7"  (1.702 m)   Wt 265 lb 3.2 oz (120.3 kg)   BMI 41.54 kg/m   General: Elderly gentleman, polite, in no acute distress.  Eyes: No icterus. Mouth: Oropharyngeal  mucosa moist and pink , no lesions erythema or exudate. Abdomen: Bowel sounds are normal, nontender.  Exam limited by body habitus Extremities: 2+ pitting edema in both lower extremities  Neuro: Alert and oriented to person only   Skin: Warm and dry, no jaundice.   Psych: Alert and cooperative, flat affect Labs:  Lab Results  Component Value Date   FERRITIN 3 (L) 04/30/2018   Lab Results  Component Value Date   IRON 11 (L) 04/30/2018   TIBC 426 04/30/2018   FERRITIN 3 (L) 04/30/2018   Lab Results  Component Value Date   WBC 5.7 05/02/2018   HGB 8.5 (L) 05/02/2018   HCT 28.6 (L) 05/02/2018   MCV 74.7 (L) 05/02/2018   PLT 168 05/02/2018    Lab Results  Component Value Date   CREATININE 0.77 05/02/2018   BUN 17 05/02/2018   NA 139 05/02/2018   K 3.7 05/02/2018   CL 105 05/02/2018   CO2 28 05/02/2018    Imaging Studies: No results found.

## 2018-08-08 NOTE — Assessment & Plan Note (Signed)
72 year old gentleman with history of profound iron deficiency anemia, hospitalized in August 2019.  EGD showed atrophic gastritis.  PPI was stopped at that time.  He is followed by hematology where he has received iron infusions twice in August.  He has had improvement in both ferritin and hemoglobin as noted above.  No reported overt GI bleeding.  Colonoscopy would be difficult to pursue in this patient given his mobility issues.  If he has further decline in ferritin, hemoglobin, we may need to consider pursuing colonoscopy at that point.  Return to the office in 3 months to see Dr. Gala Romney.

## 2018-08-08 NOTE — Progress Notes (Signed)
CC'D TO PCP °

## 2018-08-09 DIAGNOSIS — R278 Other lack of coordination: Secondary | ICD-10-CM | POA: Diagnosis not present

## 2018-08-09 DIAGNOSIS — L97429 Non-pressure chronic ulcer of left heel and midfoot with unspecified severity: Secondary | ICD-10-CM | POA: Diagnosis not present

## 2018-08-09 DIAGNOSIS — R293 Abnormal posture: Secondary | ICD-10-CM | POA: Diagnosis not present

## 2018-08-09 DIAGNOSIS — I10 Essential (primary) hypertension: Secondary | ICD-10-CM | POA: Diagnosis not present

## 2018-08-09 DIAGNOSIS — E538 Deficiency of other specified B group vitamins: Secondary | ICD-10-CM | POA: Diagnosis not present

## 2018-08-09 DIAGNOSIS — M5136 Other intervertebral disc degeneration, lumbar region: Secondary | ICD-10-CM | POA: Diagnosis not present

## 2018-08-09 DIAGNOSIS — F70 Mild intellectual disabilities: Secondary | ICD-10-CM | POA: Diagnosis not present

## 2018-08-09 DIAGNOSIS — D649 Anemia, unspecified: Secondary | ICD-10-CM | POA: Diagnosis not present

## 2018-08-10 DIAGNOSIS — F70 Mild intellectual disabilities: Secondary | ICD-10-CM | POA: Diagnosis not present

## 2018-08-10 DIAGNOSIS — R293 Abnormal posture: Secondary | ICD-10-CM | POA: Diagnosis not present

## 2018-08-10 DIAGNOSIS — M5136 Other intervertebral disc degeneration, lumbar region: Secondary | ICD-10-CM | POA: Diagnosis not present

## 2018-08-10 DIAGNOSIS — R278 Other lack of coordination: Secondary | ICD-10-CM | POA: Diagnosis not present

## 2018-08-11 DIAGNOSIS — F70 Mild intellectual disabilities: Secondary | ICD-10-CM | POA: Diagnosis not present

## 2018-08-11 DIAGNOSIS — M5136 Other intervertebral disc degeneration, lumbar region: Secondary | ICD-10-CM | POA: Diagnosis not present

## 2018-08-11 DIAGNOSIS — R278 Other lack of coordination: Secondary | ICD-10-CM | POA: Diagnosis not present

## 2018-08-11 DIAGNOSIS — R293 Abnormal posture: Secondary | ICD-10-CM | POA: Diagnosis not present

## 2018-08-14 DIAGNOSIS — R293 Abnormal posture: Secondary | ICD-10-CM | POA: Diagnosis not present

## 2018-08-14 DIAGNOSIS — M5136 Other intervertebral disc degeneration, lumbar region: Secondary | ICD-10-CM | POA: Diagnosis not present

## 2018-08-14 DIAGNOSIS — F70 Mild intellectual disabilities: Secondary | ICD-10-CM | POA: Diagnosis not present

## 2018-08-14 DIAGNOSIS — R278 Other lack of coordination: Secondary | ICD-10-CM | POA: Diagnosis not present

## 2018-08-15 DIAGNOSIS — R293 Abnormal posture: Secondary | ICD-10-CM | POA: Diagnosis not present

## 2018-08-15 DIAGNOSIS — R278 Other lack of coordination: Secondary | ICD-10-CM | POA: Diagnosis not present

## 2018-08-15 DIAGNOSIS — F70 Mild intellectual disabilities: Secondary | ICD-10-CM | POA: Diagnosis not present

## 2018-08-15 DIAGNOSIS — M5136 Other intervertebral disc degeneration, lumbar region: Secondary | ICD-10-CM | POA: Diagnosis not present

## 2018-08-16 DIAGNOSIS — L84 Corns and callosities: Secondary | ICD-10-CM | POA: Diagnosis not present

## 2018-08-16 DIAGNOSIS — L97423 Non-pressure chronic ulcer of left heel and midfoot with necrosis of muscle: Secondary | ICD-10-CM | POA: Diagnosis not present

## 2018-08-16 DIAGNOSIS — M5136 Other intervertebral disc degeneration, lumbar region: Secondary | ICD-10-CM | POA: Diagnosis not present

## 2018-08-16 DIAGNOSIS — F70 Mild intellectual disabilities: Secondary | ICD-10-CM | POA: Diagnosis not present

## 2018-08-16 DIAGNOSIS — R293 Abnormal posture: Secondary | ICD-10-CM | POA: Diagnosis not present

## 2018-08-16 DIAGNOSIS — E1151 Type 2 diabetes mellitus with diabetic peripheral angiopathy without gangrene: Secondary | ICD-10-CM | POA: Diagnosis not present

## 2018-08-16 DIAGNOSIS — R278 Other lack of coordination: Secondary | ICD-10-CM | POA: Diagnosis not present

## 2018-08-17 DIAGNOSIS — R293 Abnormal posture: Secondary | ICD-10-CM | POA: Diagnosis not present

## 2018-08-17 DIAGNOSIS — F70 Mild intellectual disabilities: Secondary | ICD-10-CM | POA: Diagnosis not present

## 2018-08-17 DIAGNOSIS — M5136 Other intervertebral disc degeneration, lumbar region: Secondary | ICD-10-CM | POA: Diagnosis not present

## 2018-08-17 DIAGNOSIS — R278 Other lack of coordination: Secondary | ICD-10-CM | POA: Diagnosis not present

## 2018-08-18 DIAGNOSIS — F70 Mild intellectual disabilities: Secondary | ICD-10-CM | POA: Diagnosis not present

## 2018-08-18 DIAGNOSIS — M5136 Other intervertebral disc degeneration, lumbar region: Secondary | ICD-10-CM | POA: Diagnosis not present

## 2018-08-18 DIAGNOSIS — R278 Other lack of coordination: Secondary | ICD-10-CM | POA: Diagnosis not present

## 2018-08-18 DIAGNOSIS — R293 Abnormal posture: Secondary | ICD-10-CM | POA: Diagnosis not present

## 2018-08-21 DIAGNOSIS — R278 Other lack of coordination: Secondary | ICD-10-CM | POA: Diagnosis not present

## 2018-08-21 DIAGNOSIS — F70 Mild intellectual disabilities: Secondary | ICD-10-CM | POA: Diagnosis not present

## 2018-08-21 DIAGNOSIS — M5136 Other intervertebral disc degeneration, lumbar region: Secondary | ICD-10-CM | POA: Diagnosis not present

## 2018-08-21 DIAGNOSIS — R293 Abnormal posture: Secondary | ICD-10-CM | POA: Diagnosis not present

## 2018-08-22 DIAGNOSIS — R293 Abnormal posture: Secondary | ICD-10-CM | POA: Diagnosis not present

## 2018-08-22 DIAGNOSIS — R278 Other lack of coordination: Secondary | ICD-10-CM | POA: Diagnosis not present

## 2018-08-22 DIAGNOSIS — F70 Mild intellectual disabilities: Secondary | ICD-10-CM | POA: Diagnosis not present

## 2018-08-22 DIAGNOSIS — M5136 Other intervertebral disc degeneration, lumbar region: Secondary | ICD-10-CM | POA: Diagnosis not present

## 2018-08-23 DIAGNOSIS — F70 Mild intellectual disabilities: Secondary | ICD-10-CM | POA: Diagnosis not present

## 2018-08-23 DIAGNOSIS — R278 Other lack of coordination: Secondary | ICD-10-CM | POA: Diagnosis not present

## 2018-08-23 DIAGNOSIS — M5136 Other intervertebral disc degeneration, lumbar region: Secondary | ICD-10-CM | POA: Diagnosis not present

## 2018-08-23 DIAGNOSIS — R293 Abnormal posture: Secondary | ICD-10-CM | POA: Diagnosis not present

## 2018-08-25 DIAGNOSIS — R293 Abnormal posture: Secondary | ICD-10-CM | POA: Diagnosis not present

## 2018-08-25 DIAGNOSIS — M5136 Other intervertebral disc degeneration, lumbar region: Secondary | ICD-10-CM | POA: Diagnosis not present

## 2018-08-25 DIAGNOSIS — F70 Mild intellectual disabilities: Secondary | ICD-10-CM | POA: Diagnosis not present

## 2018-08-25 DIAGNOSIS — R278 Other lack of coordination: Secondary | ICD-10-CM | POA: Diagnosis not present

## 2018-08-26 DIAGNOSIS — M5136 Other intervertebral disc degeneration, lumbar region: Secondary | ICD-10-CM | POA: Diagnosis not present

## 2018-08-26 DIAGNOSIS — F70 Mild intellectual disabilities: Secondary | ICD-10-CM | POA: Diagnosis not present

## 2018-08-26 DIAGNOSIS — R293 Abnormal posture: Secondary | ICD-10-CM | POA: Diagnosis not present

## 2018-08-26 DIAGNOSIS — R278 Other lack of coordination: Secondary | ICD-10-CM | POA: Diagnosis not present

## 2018-08-28 DIAGNOSIS — R293 Abnormal posture: Secondary | ICD-10-CM | POA: Diagnosis not present

## 2018-08-28 DIAGNOSIS — R278 Other lack of coordination: Secondary | ICD-10-CM | POA: Diagnosis not present

## 2018-08-28 DIAGNOSIS — M5136 Other intervertebral disc degeneration, lumbar region: Secondary | ICD-10-CM | POA: Diagnosis not present

## 2018-08-28 DIAGNOSIS — F70 Mild intellectual disabilities: Secondary | ICD-10-CM | POA: Diagnosis not present

## 2018-08-29 DIAGNOSIS — F70 Mild intellectual disabilities: Secondary | ICD-10-CM | POA: Diagnosis not present

## 2018-08-29 DIAGNOSIS — R278 Other lack of coordination: Secondary | ICD-10-CM | POA: Diagnosis not present

## 2018-08-29 DIAGNOSIS — M5136 Other intervertebral disc degeneration, lumbar region: Secondary | ICD-10-CM | POA: Diagnosis not present

## 2018-08-29 DIAGNOSIS — R293 Abnormal posture: Secondary | ICD-10-CM | POA: Diagnosis not present

## 2018-08-30 DIAGNOSIS — L97429 Non-pressure chronic ulcer of left heel and midfoot with unspecified severity: Secondary | ICD-10-CM | POA: Diagnosis not present

## 2018-08-30 DIAGNOSIS — R293 Abnormal posture: Secondary | ICD-10-CM | POA: Diagnosis not present

## 2018-08-30 DIAGNOSIS — F70 Mild intellectual disabilities: Secondary | ICD-10-CM | POA: Diagnosis not present

## 2018-08-30 DIAGNOSIS — M5136 Other intervertebral disc degeneration, lumbar region: Secondary | ICD-10-CM | POA: Diagnosis not present

## 2018-08-30 DIAGNOSIS — R278 Other lack of coordination: Secondary | ICD-10-CM | POA: Diagnosis not present

## 2018-08-31 DIAGNOSIS — M5136 Other intervertebral disc degeneration, lumbar region: Secondary | ICD-10-CM | POA: Diagnosis not present

## 2018-08-31 DIAGNOSIS — R293 Abnormal posture: Secondary | ICD-10-CM | POA: Diagnosis not present

## 2018-08-31 DIAGNOSIS — R278 Other lack of coordination: Secondary | ICD-10-CM | POA: Diagnosis not present

## 2018-08-31 DIAGNOSIS — F70 Mild intellectual disabilities: Secondary | ICD-10-CM | POA: Diagnosis not present

## 2018-09-01 DIAGNOSIS — M5136 Other intervertebral disc degeneration, lumbar region: Secondary | ICD-10-CM | POA: Diagnosis not present

## 2018-09-01 DIAGNOSIS — R293 Abnormal posture: Secondary | ICD-10-CM | POA: Diagnosis not present

## 2018-09-01 DIAGNOSIS — F70 Mild intellectual disabilities: Secondary | ICD-10-CM | POA: Diagnosis not present

## 2018-09-01 DIAGNOSIS — R278 Other lack of coordination: Secondary | ICD-10-CM | POA: Diagnosis not present

## 2018-09-04 DIAGNOSIS — R278 Other lack of coordination: Secondary | ICD-10-CM | POA: Diagnosis not present

## 2018-09-04 DIAGNOSIS — M5136 Other intervertebral disc degeneration, lumbar region: Secondary | ICD-10-CM | POA: Diagnosis not present

## 2018-09-04 DIAGNOSIS — F70 Mild intellectual disabilities: Secondary | ICD-10-CM | POA: Diagnosis not present

## 2018-09-04 DIAGNOSIS — R293 Abnormal posture: Secondary | ICD-10-CM | POA: Diagnosis not present

## 2018-09-05 DIAGNOSIS — M5136 Other intervertebral disc degeneration, lumbar region: Secondary | ICD-10-CM | POA: Diagnosis not present

## 2018-09-05 DIAGNOSIS — R278 Other lack of coordination: Secondary | ICD-10-CM | POA: Diagnosis not present

## 2018-09-05 DIAGNOSIS — F70 Mild intellectual disabilities: Secondary | ICD-10-CM | POA: Diagnosis not present

## 2018-09-05 DIAGNOSIS — R293 Abnormal posture: Secondary | ICD-10-CM | POA: Diagnosis not present

## 2018-09-06 DIAGNOSIS — E08621 Diabetes mellitus due to underlying condition with foot ulcer: Secondary | ICD-10-CM | POA: Diagnosis not present

## 2018-09-06 DIAGNOSIS — F70 Mild intellectual disabilities: Secondary | ICD-10-CM | POA: Diagnosis not present

## 2018-09-06 DIAGNOSIS — I739 Peripheral vascular disease, unspecified: Secondary | ICD-10-CM | POA: Diagnosis not present

## 2018-09-06 DIAGNOSIS — L97429 Non-pressure chronic ulcer of left heel and midfoot with unspecified severity: Secondary | ICD-10-CM | POA: Diagnosis not present

## 2018-09-06 DIAGNOSIS — R293 Abnormal posture: Secondary | ICD-10-CM | POA: Diagnosis not present

## 2018-09-06 DIAGNOSIS — E785 Hyperlipidemia, unspecified: Secondary | ICD-10-CM | POA: Diagnosis not present

## 2018-09-06 DIAGNOSIS — I10 Essential (primary) hypertension: Secondary | ICD-10-CM | POA: Diagnosis not present

## 2018-09-06 DIAGNOSIS — R278 Other lack of coordination: Secondary | ICD-10-CM | POA: Diagnosis not present

## 2018-09-06 DIAGNOSIS — M5136 Other intervertebral disc degeneration, lumbar region: Secondary | ICD-10-CM | POA: Diagnosis not present

## 2018-09-07 DIAGNOSIS — R278 Other lack of coordination: Secondary | ICD-10-CM | POA: Diagnosis not present

## 2018-09-07 DIAGNOSIS — F70 Mild intellectual disabilities: Secondary | ICD-10-CM | POA: Diagnosis not present

## 2018-09-07 DIAGNOSIS — M5136 Other intervertebral disc degeneration, lumbar region: Secondary | ICD-10-CM | POA: Diagnosis not present

## 2018-09-07 DIAGNOSIS — R293 Abnormal posture: Secondary | ICD-10-CM | POA: Diagnosis not present

## 2018-09-08 DIAGNOSIS — R293 Abnormal posture: Secondary | ICD-10-CM | POA: Diagnosis not present

## 2018-09-08 DIAGNOSIS — F70 Mild intellectual disabilities: Secondary | ICD-10-CM | POA: Diagnosis not present

## 2018-09-08 DIAGNOSIS — R278 Other lack of coordination: Secondary | ICD-10-CM | POA: Diagnosis not present

## 2018-09-08 DIAGNOSIS — M5136 Other intervertebral disc degeneration, lumbar region: Secondary | ICD-10-CM | POA: Diagnosis not present

## 2018-09-11 DIAGNOSIS — M5136 Other intervertebral disc degeneration, lumbar region: Secondary | ICD-10-CM | POA: Diagnosis not present

## 2018-09-11 DIAGNOSIS — R293 Abnormal posture: Secondary | ICD-10-CM | POA: Diagnosis not present

## 2018-09-11 DIAGNOSIS — R278 Other lack of coordination: Secondary | ICD-10-CM | POA: Diagnosis not present

## 2018-09-11 DIAGNOSIS — F70 Mild intellectual disabilities: Secondary | ICD-10-CM | POA: Diagnosis not present

## 2018-09-12 ENCOUNTER — Other Ambulatory Visit: Payer: Self-pay

## 2018-09-12 ENCOUNTER — Encounter (HOSPITAL_COMMUNITY): Payer: Self-pay | Admitting: Internal Medicine

## 2018-09-12 ENCOUNTER — Inpatient Hospital Stay (HOSPITAL_COMMUNITY): Payer: Medicaid Other | Attending: Internal Medicine | Admitting: Internal Medicine

## 2018-09-12 VITALS — BP 154/65 | HR 80 | Temp 98.5°F | Resp 20

## 2018-09-12 DIAGNOSIS — I10 Essential (primary) hypertension: Secondary | ICD-10-CM | POA: Insufficient documentation

## 2018-09-12 DIAGNOSIS — Z87891 Personal history of nicotine dependence: Secondary | ICD-10-CM | POA: Insufficient documentation

## 2018-09-12 DIAGNOSIS — Z79899 Other long term (current) drug therapy: Secondary | ICD-10-CM | POA: Insufficient documentation

## 2018-09-12 DIAGNOSIS — M5136 Other intervertebral disc degeneration, lumbar region: Secondary | ICD-10-CM | POA: Diagnosis not present

## 2018-09-12 DIAGNOSIS — R278 Other lack of coordination: Secondary | ICD-10-CM | POA: Diagnosis not present

## 2018-09-12 DIAGNOSIS — R293 Abnormal posture: Secondary | ICD-10-CM | POA: Diagnosis not present

## 2018-09-12 DIAGNOSIS — D509 Iron deficiency anemia, unspecified: Secondary | ICD-10-CM | POA: Diagnosis not present

## 2018-09-12 DIAGNOSIS — D500 Iron deficiency anemia secondary to blood loss (chronic): Secondary | ICD-10-CM

## 2018-09-12 DIAGNOSIS — F70 Mild intellectual disabilities: Secondary | ICD-10-CM | POA: Diagnosis not present

## 2018-09-12 NOTE — Patient Instructions (Signed)
[facility] Discharge Instructions  You were seen by Dr. Higgs today   Thank you for choosing [facility] to provide your oncology and hematology care.  To afford each patient quality time with our provider, please arrive at least 15 minutes before your scheduled appointment time.   If you have a lab appointment with the Cancer Center please come in thru the  Main Entrance and check in at the main information desk  You need to re-schedule your appointment should you arrive 10 or more minutes late.  We strive to give you quality time with our providers, and arriving late affects you and other patients whose appointments are after yours.  Also, if you no show three or more times for appointments you may be dismissed from the clinic at the providers discretion.     Again, thank you for choosing [facility].  Our hope is that these requests will decrease the amount of time that you wait before being seen by our physicians.       _____________________________________________________________  Should you have questions after your visit to [facility], please contact our office at [phone] between the hours of 8:00 a.m. and 4:30 p.m.  Voicemails left after 4:00 p.m. will not be returned until the following business day.  For prescription refill requests, have your pharmacy contact our office and allow 72 hours.    Cancer Center Support Programs:   > Cancer Support Group  2nd Tuesday of the month 1pm-2pm, Journey Room   

## 2018-09-12 NOTE — Progress Notes (Signed)
Diagnosis No diagnosis found.  Staging Cancer Staging No matching staging information was found for the patient.  Assessment and Plan:   1.  Iron deficiency anemia.  Patient was last treated with IV iron 04/2018.    Outside labs done 09/06/2018 reviewed and showed WBC 5.4 HB 13.4 plts 155,000.  Chemistries WNL.  Ferritin 39 which is improved from labs done 04/2018.  He has undergone GI evaluation with endoscopy on 05/01/2018 that showed multiple nodules in the stomach.  Pathology was consistent with gastritis but was negative for malignancy.  Pt should follow-up with GI.  He will RTC in 11/2018 for follow-up and labs.   2.  Multiple gastric nodules.  This was noted on endoscopy that was done 05/01/2018.  Pathology was consistent with gastritis but was negative for malignancy.  Pt referred back to GI for ongoing follow-up due to IDA.    3.  Hypertension.  Blood pressure is 154/65.  Follow-up with PCP.  Current Status:  Pt is seen today for follow-up after IV iron.  He bring in outside labs.    Problem List Patient Active Problem List   Diagnosis Date Noted  . Hypotension [I95.9]   . Acute blood loss anemia [D62] 05/01/2018  . Heme positive stool [R19.5] 05/01/2018  . Guaiac positive stools [R19.5]   . Iron deficiency anemia due to chronic blood loss [D50.0]   . GIB (gastrointestinal bleeding) [K92.2] 04/30/2018  . Bleeding ulcer [K27.4]   . Hypertension [I10]   . Encounter for screening colonoscopy 10/08/2011  . CLOSED DISLOCATION OF FOOT UNSPECIFIED PART [P59.458P] 01/15/2010    Past Medical History Past Medical History:  Diagnosis Date  . Bell's palsy   . Bleeding ulcer    remote past  . Dementia    disoriented to time.does not know medical hx.information obtained from previous charts  . Hyperlipemia   . Hypertension   . Obesity   . SOB (shortness of breath) on exertion     Past Surgical History Past Surgical History:  Procedure Laterality Date  . COLONOSCOPY  10/06/06   internal hemorrhoids and anal papilla/poor prep  . ESOPHAGOGASTRODUODENOSCOPY N/A 05/01/2018   Procedure: ESOPHAGOGASTRODUODENOSCOPY (EGD);  Surgeon: Danie Binder, MD;  Location: [facility];  Service: Endoscopy;  Laterality: N/A;  . FOOT SURGERY    . KNEE ARTHROSCOPY  06/17/2011   Procedure: ARTHROSCOPY KNEE;  Surgeon: Sanjuana Kava;  Location: [facility];  Service: Orthopedics;  Laterality: Right;    Family History Family History  Problem Relation Age of Onset  . COPD Mother   . Cancer Brother   . Anesthesia problems Neg Hx   . Hypotension Neg Hx   . Malignant hyperthermia Neg Hx   . Pseudochol deficiency Neg Hx   . Colon cancer Neg Hx      Social History  reports that he has quit smoking. His smoking use included cigarettes. He has a 30.00 pack-year smoking history. He has never used smokeless tobacco. He reports that he does not drink alcohol or use drugs.  Medications  Current Outpatient Medications:  .  acetaminophen (QC ARTHRITIS PAIN RELIEF) 650 MG CR tablet, Take 650 mg by mouth every 8 (eight) hours as needed for pain. , Disp: , Rfl:  .  Ascorbic Acid 500 MG CHEW, Chew 1 tablet by mouth daily., Disp: , Rfl:  .  atenolol (TENORMIN) 25 MG tablet, Take 1 tablet (25 mg total) by mouth daily., Disp: 30 tablet, Rfl: 0 .  atorvastatin (LIPITOR) 10 MG tablet,  Take 10 mg by mouth at bedtime. , Disp: , Rfl:  .  cadexomer iodine (IODOSORB) 0.9 % gel, Apply 1 application topically. Apply to left plantar foot topically every shift to promote wound healing, Disp: , Rfl:  .  carboxymethylcellulose (REFRESH TEARS) 0.5 % SOLN, Apply 1 drop to eye 2 (two) times daily. , Disp: , Rfl:  .  cholecalciferol (VITAMIN D) 400 units TABS tablet, Take 400 Units by mouth daily., Disp: , Rfl:  .  ferrous sulfate 325 (65 FE) MG EC tablet, Take 325 mg by mouth 2 (two) times daily., Disp: , Rfl:  .  furosemide (LASIX) 40 MG tablet, Take 40 mg by mouth daily. , Disp: , Rfl:  .  ipratropium-albuterol  (DUONEB) 0.5-2.5 (3) MG/3ML SOLN, Take 3 mLs by nebulization every 8 (eight) hours as needed (for shortness of breath)., Disp: , Rfl:  .  ketoconazole (NIZORAL) 2 % shampoo, Apply 1 application topically daily as needed for irritation (to scalp)., Disp: , Rfl:  .  magnesium hydroxide (MILK OF MAGNESIA) 400 MG/5ML suspension, Take 30 mLs by mouth daily as needed for mild constipation or moderate constipation. For constipation , Disp: , Rfl:  .  metFORMIN (GLUCOPHAGE) 500 MG tablet, Take 500 mg by mouth at bedtime., Disp: , Rfl:  .  mometasone (ELOCON) 0.1 % cream, Apply 1 application topically daily as needed (for dry skin)., Disp: , Rfl:  .  nystatin (MYCOSTATIN/NYSTOP) powder, Apply topically 2 (two) times daily. Apply to groin and penis topically every day and evening shift to promote skin healing, Disp: , Rfl:  .  nystatin-triamcinolone (MYCOLOG II) cream, Apply 1 application topically daily as needed (for rash)., Disp: , Rfl:  .  potassium chloride SA (K-DUR,KLOR-CON) 20 MEQ tablet, Take 20 mEq by mouth 2 (two) times daily. , Disp: , Rfl:  .  vitamin B-12 (CYANOCOBALAMIN) 1000 MCG tablet, Take 1,000 mcg by mouth daily., Disp: , Rfl:   Allergies Hydrocodone-acetaminophen  Review of Systems Review of Systems - Oncology ROS negative   Physical Exam  Vitals Wt Readings from Last 3 Encounters:  08/08/18 265 lb 3.2 oz (120.3 kg)  07/21/18 269 lb (122 kg)  04/30/18 274 lb 7.6 oz (124.5 kg)   Temp Readings from Last 3 Encounters:  09/12/18 98.5 F (36.9 C) (Oral)  08/08/18 (!) 97.1 F (36.2 C) (Oral)  07/21/18 97.7 F (36.5 C) (Oral)   BP Readings from Last 3 Encounters:  09/12/18 (!) 154/65  08/08/18 (!) 141/80  07/21/18 (!) 125/59   Pulse Readings from Last 3 Encounters:  09/12/18 80  08/08/18 82  07/21/18 80   Constitutional: Well-developed, well-nourished, and in no distress.   HENT: Head: Normocephalic and atraumatic.  Mouth/Throat: No oropharyngeal exudate. Mucosa  moist. Eyes: Pupils are equal, round, and reactive to light. Conjunctivae are normal. No scleral icterus.  Neck: Normal range of motion. Neck supple. No JVD present.  Cardiovascular: Normal rate, regular rhythm and normal heart sounds.  Exam reveals no gallop and no friction rub.   No murmur heard. Pulmonary/Chest: Effort normal and breath sounds normal. No respiratory distress. No wheezes.No rales.  Abdominal: Soft. Bowel sounds are normal. No distension. There is no tenderness. There is no guarding.  Musculoskeletal: No edema or tenderness.  Lymphadenopathy: No cervical, axillary or supraclavicular adenopathy.  Neurological: Alert and oriented to person, place, and time. No cranial nerve deficit.  Skin: Skin is warm and dry. No rash noted. No erythema. No pallor.  Psychiatric: Affect and judgment normal.  Labs No visits with results within 3 Day(s) from this visit.  Latest known visit with results is:  Admission on 04/30/2018, Discharged on 05/02/2018  Component Date Value Ref Range Status  . Vitamin B-12 04/30/2018 681  180 - 914 pg/mL Final   Comment: (NOTE) This assay is not validated for testing neonatal or myeloproliferative syndrome specimens for Vitamin B12 levels. Performed at [facility], [place] [address], [place], [place] [address]   . Folate 04/30/2018 11.1  >5.9 ng/mL Final   Performed at [facility], [place] [address], [place], [place] [address]  . Iron 04/30/2018 11* 45 - 182 ug/dL Final  . TIBC 04/30/2018 426  250 - 450 ug/dL Final  . Saturation Ratios 04/30/2018 3* 17.9 - 39.5 % Final  . UIBC 04/30/2018 415  ug/dL Final   Performed at [facility], [place] [address] [place], [place] [address]  . Ferritin 04/30/2018 3* 24 - 336 ng/mL Final   Performed at [facility], [place] [address] [place], [place] [address]  . Retic Ct Pct 04/30/2018 2.0  0.4 - 3.1 % Final  . RBC. 04/30/2018 3.29* 4.22 - 5.81 MIL/uL Final  . Retic Count, Absolute 04/30/2018  65.8  19.0 - 186.0 K/uL Final   Performed at [facility], [address] [place], [place] [address]  . WBC 04/30/2018 4.1  4.0 - 10.5 K/uL Final  . RBC 04/30/2018 3.29* 4.22 - 5.81 MIL/uL Final  . Hemoglobin 04/30/2018 6.4* 13.0 - 17.0 g/dL Final   Comment: REPEATED TO VERIFY CRITICAL RESULT CALLED TO, READ BACK BY AND VERIFIED WITH: BLACKBURN @ 1536 ON [id number] BY HENDERSON L.   . HCT 04/30/2018 23.2* 39.0 - 52.0 % Final  . MCV 04/30/2018 70.5* 78.0 - 100.0 fL Final  . MCH 04/30/2018 19.5* 26.0 - 34.0 pg Final  . MCHC 04/30/2018 27.6* 30.0 - 36.0 g/dL Final  . RDW 04/30/2018 16.0* 11.5 - 15.5 % Final  . Platelets 04/30/2018 217  150 - 400 K/uL Final  . Neutrophils Relative % 04/30/2018 47  % Final  . Lymphocytes Relative 04/30/2018 34  % Final  . Monocytes Relative 04/30/2018 14  % Final  . Eosinophils Relative 04/30/2018 4  % Final  . Basophils Relative 04/30/2018 1  % Final  . Neutro Abs 04/30/2018 1.9  1.7 - 7.7 K/uL Final  . Lymphs Abs 04/30/2018 1.4  0.7 - 4.0 K/uL Final  . Monocytes Absolute 04/30/2018 0.6  0.1 - 1.0 K/uL Final  . Eosinophils Absolute 04/30/2018 0.2  0.0 - 0.7 K/uL Final  . Basophils Absolute 04/30/2018 0.0  0.0 - 0.1 K/uL Final  . RBC Morphology 04/30/2018 ANISOCYTES   Final   Performed at [facility], [address] [place], [place] [address]  . Sodium 04/30/2018 138  135 - 145 mmol/L Final  . Potassium 04/30/2018 3.6  3.5 - 5.1 mmol/L Final  . Chloride 04/30/2018 103  98 - 111 mmol/L Final  . CO2 04/30/2018 30  22 - 32 mmol/L Final  . Glucose, Bld 04/30/2018 130* 70 - 99 mg/dL Final  . BUN 04/30/2018 15  8 - 23 mg/dL Final  . Creatinine, Ser 04/30/2018 0.86  0.61 - 1.24 mg/dL Final  . Calcium 04/30/2018 8.7* 8.9 - 10.3 mg/dL Final  . GFR calc non Af Amer 04/30/2018 >60  >60 mL/min Final  . GFR calc Af Amer 04/30/2018 >60  >60 mL/min Final   Comment: (NOTE) The eGFR has been calculated using  the CKD EPI equation. This calculation has not been  validated in all clinical situations. eGFR's persistently <60 mL/min signify possible Chronic Kidney Disease.   Georgiann Hahn gap 04/30/2018 5  5 - 15 Final   Performed at [facility], [address] [place], [place] [address]  . Prothrombin Time 04/30/2018 13.6  11.4 - 15.2 seconds Final  . INR 04/30/2018 1.05   Final   Performed at [facility], [address] [place], [place] [address]  . ABO/RH(D) 04/30/2018 O NEG   Final  . Antibody Screen 04/30/2018 POS   Final  . Sample Expiration 04/30/2018 05/03/2018   Final  . Antibody Identification 61/44/3154 ANTI K   Final  . Unit Number 04/30/2018    Final                   MGQQP:[id number] Performed at [facility], [place] [address], [place], [place] [address]   . Blood Component Type 04/30/2018    Final                   Value:RED CELLS,LR Performed at [facility], [place] [address] [place], [place] [address]   . Unit division 04/30/2018    Final                   Value:00 Performed at [facility], [place] [address] [place], [place] [address]   . Status of Unit 04/30/2018    Final                   Value:ISSUED,FINAL Performed at [facility], [address], [place], [place] [address]   . Donor AG Type 04/30/2018 NEGATIVE FOR KELL ANTIGEN   Final  . Transfusion Status 04/30/2018 OK TO TRANSFUSE   Final  . Crossmatch Result 04/30/2018 COMPATIBLE   Final  . Unit Number 04/30/2018 [id number]   Final  . Blood Component Type 04/30/2018 RED CELLS,LR   Final  . Unit division 04/30/2018 00   Final  . Status of Unit 04/30/2018 ISSUED,FINAL   Final  . Donor AG Type 04/30/2018 NEGATIVE FOR KELL ANTIGEN   Final  . Transfusion Status 04/30/2018 OK TO TRANSFUSE   Final  . Crossmatch Result 04/30/2018 COMPATIBLE   Final  . Fecal Occult Bld 04/30/2018 POSITIVE* NEGATIVE Final  . Lactic Acid, Venous 04/30/2018 1.4  0.5 - 1.9 mmol/L Final   Performed at [facility], [address], [place], [place] [address]  . Lactic Acid,  Venous 04/30/2018 1.3  0.5 - 1.9 mmol/L Final   Performed at [facility], [address], [place], [place] [address]  . Order Confirmation 04/30/2018    Final                   Value:ORDER PROCESSED BY BLOOD BANK Performed at [facility], [address], [place], [place] [address]   . ISSUE DATE / TIME 04/30/2018 [id number]   Final  . Blood Product Unit Number 04/30/2018 [id number]   Final  . PRODUCT CODE 04/30/2018 [id number]   Final  . Unit Type and Rh 04/30/2018 6200   Final  . Blood Product Expiration Date 04/30/2018 [id number]   Final  . ISSUE DATE / TIME 04/30/2018 [id number]   Final  . Blood Product Unit Number 04/30/2018 [id number]   Final  . PRODUCT CODE 04/30/2018 [id number]   Final  . Unit Type and Rh 04/30/2018 9500   Final  . Blood Product Expiration Date  04/30/2018 [id number]   Final  . WBC 05/01/2018 5.7  4.0 - 10.5 K/uL Final  . RBC 05/01/2018 3.88* 4.22 - 5.81 MIL/uL Final  . Hemoglobin 05/01/2018 8.3* 13.0 - 17.0 g/dL Final   Comment: DELTA CHECK NOTED POST TRANSFUSION SPECIMEN   . HCT 05/01/2018 28.9* 39.0 - 52.0 % Final  . MCV 05/01/2018 74.5* 78.0 - 100.0 fL Final  . MCH 05/01/2018 21.4* 26.0 - 34.0 pg Final  . MCHC 05/01/2018 28.7* 30.0 - 36.0 g/dL Final  . RDW 05/01/2018 18.1* 11.5 - 15.5 % Final  . Platelets 05/01/2018 180  150 - 400 K/uL Final   Performed at [facility], [address], [place], [place] [address]  . Sodium 05/01/2018 138  135 - 145 mmol/L Final  . Potassium 05/01/2018 4.0  3.5 - 5.1 mmol/L Final  . Chloride 05/01/2018 102  98 - 111 mmol/L Final  . CO2 05/01/2018 30  22 - 32 mmol/L Final  . Glucose, Bld 05/01/2018 101* 70 - 99 mg/dL Final  . BUN 05/01/2018 16  8 - 23 mg/dL Final  . Creatinine, Ser 05/01/2018 0.81  0.61 - 1.24 mg/dL Final  . Calcium 05/01/2018 8.4* 8.9 - 10.3 mg/dL Final  . GFR calc non Af Amer 05/01/2018 >60  >60 mL/min Final  . GFR calc Af Amer 05/01/2018 >60  >60 mL/min Final   Comment: (NOTE) The eGFR has  been calculated using the CKD EPI equation. This calculation has not been validated in all clinical situations. eGFR's persistently <60 mL/min signify possible Chronic Kidney Disease.   Georgiann Hahn gap 05/01/2018 6  5 - 15 Final   Performed at [facility], [address] [place], [place] [address]  . MRSA by PCR 04/30/2018 POSITIVE* NEGATIVE Final   Comment:        The GeneXpert MRSA Assay (FDA approved for NASAL specimens only), is one component of a comprehensive MRSA colonization surveillance program. It is not intended to diagnose MRSA infection nor to guide or monitor treatment for MRSA infections. RESULT CALLED TO, READ BACK BY AND VERIFIED WITH: GRAVES,K @ 0034 ON 05/01/18 BY JUW Performed at [facility], [address], [place], [place] [address]   . Glucose-Capillary 04/30/2018 122* 70 - 99 mg/dL Final  . Comment 1 04/30/2018 Notify RN   Final  . Comment 2 04/30/2018 Document in Chart   Final  . Glucose-Capillary 05/01/2018 90  70 - 99 mg/dL Final  . Glucose-Capillary 05/01/2018 87  70 - 99 mg/dL Final  . Glucose-Capillary 05/01/2018 79  70 - 99 mg/dL Final  . Comment 1 05/01/2018 Notify RN   Final  . Comment 2 05/01/2018 Document in Chart   Final  . Hgb A1c MFr Bld 05/02/2018 6.2* 4.8 - 5.6 % Final   Comment: (NOTE) Pre diabetes:          5.7%-6.4% Diabetes:              >6.4% Glycemic control for   <7.0% adults with diabetes   . Mean Plasma Glucose 05/02/2018 131.24  mg/dL Final   Performed at [place] [address], [place], [place] [address]  . Sodium 05/02/2018 139  135 - 145 mmol/L Final  . Potassium 05/02/2018 3.7  3.5 - 5.1 mmol/L Final  . Chloride 05/02/2018 105  98 - 111 mmol/L Final  . CO2 05/02/2018 28  22 - 32 mmol/L Final  . Glucose, Bld 05/02/2018 82  70 - 99 mg/dL Final  . BUN 05/02/2018  17  8 - 23 mg/dL Final  . Creatinine, Ser 05/02/2018 0.77  0.61 - 1.24 mg/dL Final  . Calcium 05/02/2018 8.5* 8.9 - 10.3 mg/dL Final  . GFR calc non Af  Amer 05/02/2018 >60  >60 mL/min Final  . GFR calc Af Amer 05/02/2018 >60  >60 mL/min Final   Comment: (NOTE) The eGFR has been calculated using the CKD EPI equation. This calculation has not been validated in all clinical situations. eGFR's persistently <60 mL/min signify possible Chronic Kidney Disease.   Georgiann Hahn gap 05/02/2018 6  5 - 15 Final   Performed at [facility], [address] [place], [place] [address]  . WBC 05/02/2018 5.7  4.0 - 10.5 K/uL Final  . RBC 05/02/2018 3.83* 4.22 - 5.81 MIL/uL Final  . Hemoglobin 05/02/2018 8.5* 13.0 - 17.0 g/dL Final  . HCT 05/02/2018 28.6* 39.0 - 52.0 % Final  . MCV 05/02/2018 74.7* 78.0 - 100.0 fL Final  . MCH 05/02/2018 22.2* 26.0 - 34.0 pg Final  . MCHC 05/02/2018 29.7* 30.0 - 36.0 g/dL Final  . RDW 05/02/2018 18.8* 11.5 - 15.5 % Final  . Platelets 05/02/2018 168  150 - 400 K/uL Final   Performed at [facility], [address], [place], [place] [address]  . Magnesium 05/02/2018 2.0  1.7 - 2.4 mg/dL Final   Performed at [facility], [address], [place], [place] [address]  . Glucose-Capillary 05/01/2018 103* 70 - 99 mg/dL Final  . Glucose-Capillary 05/02/2018 82  70 - 99 mg/dL Final  . Comment 1 05/02/2018 Notify RN   Final  . Comment 2 05/02/2018 Document in Chart   Final  . Glucose-Capillary 05/02/2018 92  70 - 99 mg/dL Final  . Comment 1 05/02/2018 Notify RN   Final  . Comment 2 05/02/2018 Document in Chart   Final     Pathology No orders of the defined types were placed in this encounter.      Zoila Shutter MD

## 2018-09-13 DIAGNOSIS — R278 Other lack of coordination: Secondary | ICD-10-CM | POA: Diagnosis not present

## 2018-09-13 DIAGNOSIS — R293 Abnormal posture: Secondary | ICD-10-CM | POA: Diagnosis not present

## 2018-09-13 DIAGNOSIS — L97423 Non-pressure chronic ulcer of left heel and midfoot with necrosis of muscle: Secondary | ICD-10-CM | POA: Diagnosis not present

## 2018-09-13 DIAGNOSIS — F70 Mild intellectual disabilities: Secondary | ICD-10-CM | POA: Diagnosis not present

## 2018-09-13 DIAGNOSIS — M5136 Other intervertebral disc degeneration, lumbar region: Secondary | ICD-10-CM | POA: Diagnosis not present

## 2018-10-03 DIAGNOSIS — E119 Type 2 diabetes mellitus without complications: Secondary | ICD-10-CM | POA: Diagnosis not present

## 2018-10-03 DIAGNOSIS — D649 Anemia, unspecified: Secondary | ICD-10-CM | POA: Diagnosis not present

## 2018-10-03 DIAGNOSIS — E785 Hyperlipidemia, unspecified: Secondary | ICD-10-CM | POA: Diagnosis not present

## 2018-10-03 DIAGNOSIS — I10 Essential (primary) hypertension: Secondary | ICD-10-CM | POA: Diagnosis not present

## 2018-10-05 ENCOUNTER — Ambulatory Visit (INDEPENDENT_AMBULATORY_CARE_PROVIDER_SITE_OTHER): Payer: Medicare Other | Admitting: Otolaryngology

## 2018-10-05 DIAGNOSIS — H6123 Impacted cerumen, bilateral: Secondary | ICD-10-CM

## 2018-10-16 ENCOUNTER — Encounter: Payer: Self-pay | Admitting: Internal Medicine

## 2018-10-17 DIAGNOSIS — L219 Seborrheic dermatitis, unspecified: Secondary | ICD-10-CM | POA: Diagnosis not present

## 2018-10-17 DIAGNOSIS — M6281 Muscle weakness (generalized): Secondary | ICD-10-CM | POA: Diagnosis not present

## 2018-10-17 DIAGNOSIS — E11621 Type 2 diabetes mellitus with foot ulcer: Secondary | ICD-10-CM | POA: Diagnosis not present

## 2018-10-17 DIAGNOSIS — E119 Type 2 diabetes mellitus without complications: Secondary | ICD-10-CM | POA: Diagnosis not present

## 2018-10-17 DIAGNOSIS — R293 Abnormal posture: Secondary | ICD-10-CM | POA: Diagnosis not present

## 2018-10-17 DIAGNOSIS — E538 Deficiency of other specified B group vitamins: Secondary | ICD-10-CM | POA: Diagnosis not present

## 2018-10-18 DIAGNOSIS — M6281 Muscle weakness (generalized): Secondary | ICD-10-CM | POA: Diagnosis not present

## 2018-10-18 DIAGNOSIS — R293 Abnormal posture: Secondary | ICD-10-CM | POA: Diagnosis not present

## 2018-10-18 DIAGNOSIS — E11621 Type 2 diabetes mellitus with foot ulcer: Secondary | ICD-10-CM | POA: Diagnosis not present

## 2018-10-19 DIAGNOSIS — E11621 Type 2 diabetes mellitus with foot ulcer: Secondary | ICD-10-CM | POA: Diagnosis not present

## 2018-10-19 DIAGNOSIS — M6281 Muscle weakness (generalized): Secondary | ICD-10-CM | POA: Diagnosis not present

## 2018-10-19 DIAGNOSIS — R293 Abnormal posture: Secondary | ICD-10-CM | POA: Diagnosis not present

## 2018-10-20 DIAGNOSIS — E11621 Type 2 diabetes mellitus with foot ulcer: Secondary | ICD-10-CM | POA: Diagnosis not present

## 2018-10-20 DIAGNOSIS — M6281 Muscle weakness (generalized): Secondary | ICD-10-CM | POA: Diagnosis not present

## 2018-10-20 DIAGNOSIS — R293 Abnormal posture: Secondary | ICD-10-CM | POA: Diagnosis not present

## 2018-10-21 DIAGNOSIS — E08621 Diabetes mellitus due to underlying condition with foot ulcer: Secondary | ICD-10-CM | POA: Diagnosis not present

## 2018-10-21 DIAGNOSIS — D509 Iron deficiency anemia, unspecified: Secondary | ICD-10-CM | POA: Diagnosis not present

## 2018-10-21 DIAGNOSIS — E119 Type 2 diabetes mellitus without complications: Secondary | ICD-10-CM | POA: Diagnosis not present

## 2018-10-21 DIAGNOSIS — D519 Vitamin B12 deficiency anemia, unspecified: Secondary | ICD-10-CM | POA: Diagnosis not present

## 2018-10-21 DIAGNOSIS — D649 Anemia, unspecified: Secondary | ICD-10-CM | POA: Diagnosis not present

## 2018-10-21 DIAGNOSIS — N184 Chronic kidney disease, stage 4 (severe): Secondary | ICD-10-CM | POA: Diagnosis not present

## 2018-10-21 DIAGNOSIS — Z79899 Other long term (current) drug therapy: Secondary | ICD-10-CM | POA: Diagnosis not present

## 2018-10-23 DIAGNOSIS — D649 Anemia, unspecified: Secondary | ICD-10-CM | POA: Diagnosis not present

## 2018-10-23 DIAGNOSIS — E11621 Type 2 diabetes mellitus with foot ulcer: Secondary | ICD-10-CM | POA: Diagnosis not present

## 2018-10-23 DIAGNOSIS — E119 Type 2 diabetes mellitus without complications: Secondary | ICD-10-CM | POA: Diagnosis not present

## 2018-10-23 DIAGNOSIS — M6281 Muscle weakness (generalized): Secondary | ICD-10-CM | POA: Diagnosis not present

## 2018-10-23 DIAGNOSIS — R293 Abnormal posture: Secondary | ICD-10-CM | POA: Diagnosis not present

## 2018-10-23 DIAGNOSIS — K219 Gastro-esophageal reflux disease without esophagitis: Secondary | ICD-10-CM | POA: Diagnosis not present

## 2018-10-24 DIAGNOSIS — E08621 Diabetes mellitus due to underlying condition with foot ulcer: Secondary | ICD-10-CM | POA: Diagnosis not present

## 2018-10-24 DIAGNOSIS — M6281 Muscle weakness (generalized): Secondary | ICD-10-CM | POA: Diagnosis not present

## 2018-10-24 DIAGNOSIS — R293 Abnormal posture: Secondary | ICD-10-CM | POA: Diagnosis not present

## 2018-10-24 DIAGNOSIS — E11621 Type 2 diabetes mellitus with foot ulcer: Secondary | ICD-10-CM | POA: Diagnosis not present

## 2018-10-25 DIAGNOSIS — R293 Abnormal posture: Secondary | ICD-10-CM | POA: Diagnosis not present

## 2018-10-25 DIAGNOSIS — E11621 Type 2 diabetes mellitus with foot ulcer: Secondary | ICD-10-CM | POA: Diagnosis not present

## 2018-10-25 DIAGNOSIS — M6281 Muscle weakness (generalized): Secondary | ICD-10-CM | POA: Diagnosis not present

## 2018-10-26 DIAGNOSIS — R293 Abnormal posture: Secondary | ICD-10-CM | POA: Diagnosis not present

## 2018-10-26 DIAGNOSIS — M6281 Muscle weakness (generalized): Secondary | ICD-10-CM | POA: Diagnosis not present

## 2018-10-26 DIAGNOSIS — E11621 Type 2 diabetes mellitus with foot ulcer: Secondary | ICD-10-CM | POA: Diagnosis not present

## 2018-10-27 DIAGNOSIS — M6281 Muscle weakness (generalized): Secondary | ICD-10-CM | POA: Diagnosis not present

## 2018-10-27 DIAGNOSIS — R293 Abnormal posture: Secondary | ICD-10-CM | POA: Diagnosis not present

## 2018-10-27 DIAGNOSIS — E11621 Type 2 diabetes mellitus with foot ulcer: Secondary | ICD-10-CM | POA: Diagnosis not present

## 2018-10-30 DIAGNOSIS — M1612 Unilateral primary osteoarthritis, left hip: Secondary | ICD-10-CM | POA: Diagnosis not present

## 2018-10-30 DIAGNOSIS — M6281 Muscle weakness (generalized): Secondary | ICD-10-CM | POA: Diagnosis not present

## 2018-10-30 DIAGNOSIS — E11621 Type 2 diabetes mellitus with foot ulcer: Secondary | ICD-10-CM | POA: Diagnosis not present

## 2018-10-30 DIAGNOSIS — M25552 Pain in left hip: Secondary | ICD-10-CM | POA: Diagnosis not present

## 2018-10-30 DIAGNOSIS — R293 Abnormal posture: Secondary | ICD-10-CM | POA: Diagnosis not present

## 2018-10-31 DIAGNOSIS — R293 Abnormal posture: Secondary | ICD-10-CM | POA: Diagnosis not present

## 2018-10-31 DIAGNOSIS — E11621 Type 2 diabetes mellitus with foot ulcer: Secondary | ICD-10-CM | POA: Diagnosis not present

## 2018-10-31 DIAGNOSIS — M1990 Unspecified osteoarthritis, unspecified site: Secondary | ICD-10-CM | POA: Diagnosis not present

## 2018-10-31 DIAGNOSIS — E08621 Diabetes mellitus due to underlying condition with foot ulcer: Secondary | ICD-10-CM | POA: Diagnosis not present

## 2018-10-31 DIAGNOSIS — M25552 Pain in left hip: Secondary | ICD-10-CM | POA: Diagnosis not present

## 2018-10-31 DIAGNOSIS — M6281 Muscle weakness (generalized): Secondary | ICD-10-CM | POA: Diagnosis not present

## 2018-11-01 DIAGNOSIS — M6281 Muscle weakness (generalized): Secondary | ICD-10-CM | POA: Diagnosis not present

## 2018-11-01 DIAGNOSIS — E11621 Type 2 diabetes mellitus with foot ulcer: Secondary | ICD-10-CM | POA: Diagnosis not present

## 2018-11-01 DIAGNOSIS — R293 Abnormal posture: Secondary | ICD-10-CM | POA: Diagnosis not present

## 2018-11-02 DIAGNOSIS — E11621 Type 2 diabetes mellitus with foot ulcer: Secondary | ICD-10-CM | POA: Diagnosis not present

## 2018-11-02 DIAGNOSIS — M6281 Muscle weakness (generalized): Secondary | ICD-10-CM | POA: Diagnosis not present

## 2018-11-02 DIAGNOSIS — R293 Abnormal posture: Secondary | ICD-10-CM | POA: Diagnosis not present

## 2018-11-03 DIAGNOSIS — R293 Abnormal posture: Secondary | ICD-10-CM | POA: Diagnosis not present

## 2018-11-03 DIAGNOSIS — E11621 Type 2 diabetes mellitus with foot ulcer: Secondary | ICD-10-CM | POA: Diagnosis not present

## 2018-11-03 DIAGNOSIS — M6281 Muscle weakness (generalized): Secondary | ICD-10-CM | POA: Diagnosis not present

## 2018-11-05 DIAGNOSIS — E11621 Type 2 diabetes mellitus with foot ulcer: Secondary | ICD-10-CM | POA: Diagnosis not present

## 2018-11-05 DIAGNOSIS — R293 Abnormal posture: Secondary | ICD-10-CM | POA: Diagnosis not present

## 2018-11-05 DIAGNOSIS — M6281 Muscle weakness (generalized): Secondary | ICD-10-CM | POA: Diagnosis not present

## 2018-11-07 DIAGNOSIS — R293 Abnormal posture: Secondary | ICD-10-CM | POA: Diagnosis not present

## 2018-11-07 DIAGNOSIS — E08621 Diabetes mellitus due to underlying condition with foot ulcer: Secondary | ICD-10-CM | POA: Diagnosis not present

## 2018-11-07 DIAGNOSIS — R278 Other lack of coordination: Secondary | ICD-10-CM | POA: Diagnosis not present

## 2018-11-07 DIAGNOSIS — M6281 Muscle weakness (generalized): Secondary | ICD-10-CM | POA: Diagnosis not present

## 2018-11-07 DIAGNOSIS — E11621 Type 2 diabetes mellitus with foot ulcer: Secondary | ICD-10-CM | POA: Diagnosis not present

## 2018-11-08 DIAGNOSIS — R293 Abnormal posture: Secondary | ICD-10-CM | POA: Diagnosis not present

## 2018-11-08 DIAGNOSIS — M6281 Muscle weakness (generalized): Secondary | ICD-10-CM | POA: Diagnosis not present

## 2018-11-08 DIAGNOSIS — E11621 Type 2 diabetes mellitus with foot ulcer: Secondary | ICD-10-CM | POA: Diagnosis not present

## 2018-11-08 DIAGNOSIS — E1151 Type 2 diabetes mellitus with diabetic peripheral angiopathy without gangrene: Secondary | ICD-10-CM | POA: Diagnosis not present

## 2018-11-08 DIAGNOSIS — L84 Corns and callosities: Secondary | ICD-10-CM | POA: Diagnosis not present

## 2018-11-08 DIAGNOSIS — I739 Peripheral vascular disease, unspecified: Secondary | ICD-10-CM | POA: Diagnosis not present

## 2018-11-09 DIAGNOSIS — R293 Abnormal posture: Secondary | ICD-10-CM | POA: Diagnosis not present

## 2018-11-09 DIAGNOSIS — E11621 Type 2 diabetes mellitus with foot ulcer: Secondary | ICD-10-CM | POA: Diagnosis not present

## 2018-11-09 DIAGNOSIS — M6281 Muscle weakness (generalized): Secondary | ICD-10-CM | POA: Diagnosis not present

## 2018-11-10 DIAGNOSIS — M6281 Muscle weakness (generalized): Secondary | ICD-10-CM | POA: Diagnosis not present

## 2018-11-10 DIAGNOSIS — D649 Anemia, unspecified: Secondary | ICD-10-CM | POA: Diagnosis not present

## 2018-11-10 DIAGNOSIS — R293 Abnormal posture: Secondary | ICD-10-CM | POA: Diagnosis not present

## 2018-11-10 DIAGNOSIS — E559 Vitamin D deficiency, unspecified: Secondary | ICD-10-CM | POA: Diagnosis not present

## 2018-11-10 DIAGNOSIS — E11621 Type 2 diabetes mellitus with foot ulcer: Secondary | ICD-10-CM | POA: Diagnosis not present

## 2018-11-10 DIAGNOSIS — L219 Seborrheic dermatitis, unspecified: Secondary | ICD-10-CM | POA: Diagnosis not present

## 2018-11-13 DIAGNOSIS — M6281 Muscle weakness (generalized): Secondary | ICD-10-CM | POA: Diagnosis not present

## 2018-11-13 DIAGNOSIS — R293 Abnormal posture: Secondary | ICD-10-CM | POA: Diagnosis not present

## 2018-11-13 DIAGNOSIS — M25552 Pain in left hip: Secondary | ICD-10-CM | POA: Diagnosis not present

## 2018-11-13 DIAGNOSIS — L219 Seborrheic dermatitis, unspecified: Secondary | ICD-10-CM | POA: Diagnosis not present

## 2018-11-13 DIAGNOSIS — E11621 Type 2 diabetes mellitus with foot ulcer: Secondary | ICD-10-CM | POA: Diagnosis not present

## 2018-11-14 DIAGNOSIS — E08621 Diabetes mellitus due to underlying condition with foot ulcer: Secondary | ICD-10-CM | POA: Diagnosis not present

## 2018-11-14 DIAGNOSIS — R278 Other lack of coordination: Secondary | ICD-10-CM | POA: Diagnosis not present

## 2018-11-14 DIAGNOSIS — R293 Abnormal posture: Secondary | ICD-10-CM | POA: Diagnosis not present

## 2018-11-14 DIAGNOSIS — E11621 Type 2 diabetes mellitus with foot ulcer: Secondary | ICD-10-CM | POA: Diagnosis not present

## 2018-11-14 DIAGNOSIS — M6281 Muscle weakness (generalized): Secondary | ICD-10-CM | POA: Diagnosis not present

## 2018-11-15 DIAGNOSIS — M6281 Muscle weakness (generalized): Secondary | ICD-10-CM | POA: Diagnosis not present

## 2018-11-15 DIAGNOSIS — R293 Abnormal posture: Secondary | ICD-10-CM | POA: Diagnosis not present

## 2018-11-15 DIAGNOSIS — E11621 Type 2 diabetes mellitus with foot ulcer: Secondary | ICD-10-CM | POA: Diagnosis not present

## 2018-11-23 DIAGNOSIS — E08621 Diabetes mellitus due to underlying condition with foot ulcer: Secondary | ICD-10-CM | POA: Diagnosis not present

## 2018-11-23 DIAGNOSIS — M6281 Muscle weakness (generalized): Secondary | ICD-10-CM | POA: Diagnosis not present

## 2018-11-23 DIAGNOSIS — R278 Other lack of coordination: Secondary | ICD-10-CM | POA: Diagnosis not present

## 2018-11-28 DIAGNOSIS — E08621 Diabetes mellitus due to underlying condition with foot ulcer: Secondary | ICD-10-CM | POA: Diagnosis not present

## 2018-11-28 DIAGNOSIS — R278 Other lack of coordination: Secondary | ICD-10-CM | POA: Diagnosis not present

## 2018-11-28 DIAGNOSIS — M6281 Muscle weakness (generalized): Secondary | ICD-10-CM | POA: Diagnosis not present

## 2018-12-05 DIAGNOSIS — E08621 Diabetes mellitus due to underlying condition with foot ulcer: Secondary | ICD-10-CM | POA: Diagnosis not present

## 2018-12-05 DIAGNOSIS — M6281 Muscle weakness (generalized): Secondary | ICD-10-CM | POA: Diagnosis not present

## 2018-12-05 DIAGNOSIS — R278 Other lack of coordination: Secondary | ICD-10-CM | POA: Diagnosis not present

## 2018-12-06 ENCOUNTER — Other Ambulatory Visit: Payer: Self-pay

## 2018-12-06 ENCOUNTER — Inpatient Hospital Stay (HOSPITAL_COMMUNITY): Payer: Medicare Other | Attending: Internal Medicine

## 2018-12-06 DIAGNOSIS — E119 Type 2 diabetes mellitus without complications: Secondary | ICD-10-CM | POA: Diagnosis not present

## 2018-12-06 DIAGNOSIS — D509 Iron deficiency anemia, unspecified: Secondary | ICD-10-CM | POA: Diagnosis not present

## 2018-12-06 DIAGNOSIS — Z7984 Long term (current) use of oral hypoglycemic drugs: Secondary | ICD-10-CM | POA: Insufficient documentation

## 2018-12-06 DIAGNOSIS — Z79899 Other long term (current) drug therapy: Secondary | ICD-10-CM | POA: Diagnosis not present

## 2018-12-06 DIAGNOSIS — D500 Iron deficiency anemia secondary to blood loss (chronic): Secondary | ICD-10-CM

## 2018-12-06 DIAGNOSIS — Z87891 Personal history of nicotine dependence: Secondary | ICD-10-CM | POA: Insufficient documentation

## 2018-12-06 LAB — COMPREHENSIVE METABOLIC PANEL
ALK PHOS: 103 U/L (ref 38–126)
ALT: 18 U/L (ref 0–44)
ANION GAP: 9 (ref 5–15)
AST: 20 U/L (ref 15–41)
Albumin: 3.9 g/dL (ref 3.5–5.0)
BILIRUBIN TOTAL: 0.7 mg/dL (ref 0.3–1.2)
BUN: 18 mg/dL (ref 8–23)
CALCIUM: 9.2 mg/dL (ref 8.9–10.3)
CO2: 29 mmol/L (ref 22–32)
Chloride: 103 mmol/L (ref 98–111)
Creatinine, Ser: 0.81 mg/dL (ref 0.61–1.24)
Glucose, Bld: 130 mg/dL — ABNORMAL HIGH (ref 70–99)
Potassium: 3.8 mmol/L (ref 3.5–5.1)
Sodium: 141 mmol/L (ref 135–145)
TOTAL PROTEIN: 7.1 g/dL (ref 6.5–8.1)

## 2018-12-06 LAB — CBC WITH DIFFERENTIAL/PLATELET
Abs Immature Granulocytes: 0.02 10*3/uL (ref 0.00–0.07)
Basophils Absolute: 0 10*3/uL (ref 0.0–0.1)
Basophils Relative: 1 %
EOS PCT: 3 %
Eosinophils Absolute: 0.2 10*3/uL (ref 0.0–0.5)
HCT: 44.1 % (ref 39.0–52.0)
HEMOGLOBIN: 14.1 g/dL (ref 13.0–17.0)
Immature Granulocytes: 0 %
LYMPHS PCT: 30 %
Lymphs Abs: 1.7 10*3/uL (ref 0.7–4.0)
MCH: 30.4 pg (ref 26.0–34.0)
MCHC: 32 g/dL (ref 30.0–36.0)
MCV: 95 fL (ref 80.0–100.0)
MONO ABS: 0.5 10*3/uL (ref 0.1–1.0)
Monocytes Relative: 9 %
Neutro Abs: 3.2 10*3/uL (ref 1.7–7.7)
Neutrophils Relative %: 57 %
Platelets: 178 10*3/uL (ref 150–400)
RBC: 4.64 MIL/uL (ref 4.22–5.81)
RDW: 13.2 % (ref 11.5–15.5)
WBC: 5.6 10*3/uL (ref 4.0–10.5)
nRBC: 0 % (ref 0.0–0.2)

## 2018-12-06 LAB — LACTATE DEHYDROGENASE: LDH: 107 U/L (ref 98–192)

## 2018-12-06 LAB — FERRITIN: FERRITIN: 20 ng/mL — AB (ref 24–336)

## 2018-12-12 DIAGNOSIS — R278 Other lack of coordination: Secondary | ICD-10-CM | POA: Diagnosis not present

## 2018-12-12 DIAGNOSIS — M6281 Muscle weakness (generalized): Secondary | ICD-10-CM | POA: Diagnosis not present

## 2018-12-12 DIAGNOSIS — E08621 Diabetes mellitus due to underlying condition with foot ulcer: Secondary | ICD-10-CM | POA: Diagnosis not present

## 2018-12-13 ENCOUNTER — Inpatient Hospital Stay (HOSPITAL_BASED_OUTPATIENT_CLINIC_OR_DEPARTMENT_OTHER): Payer: Medicare Other | Admitting: Hematology

## 2018-12-13 ENCOUNTER — Encounter (HOSPITAL_COMMUNITY): Payer: Self-pay | Admitting: Hematology

## 2018-12-13 ENCOUNTER — Other Ambulatory Visit: Payer: Self-pay

## 2018-12-13 VITALS — BP 131/57 | HR 74 | Temp 97.8°F | Resp 18

## 2018-12-13 DIAGNOSIS — D500 Iron deficiency anemia secondary to blood loss (chronic): Secondary | ICD-10-CM

## 2018-12-13 DIAGNOSIS — Z87891 Personal history of nicotine dependence: Secondary | ICD-10-CM

## 2018-12-13 DIAGNOSIS — D509 Iron deficiency anemia, unspecified: Secondary | ICD-10-CM | POA: Diagnosis not present

## 2018-12-13 DIAGNOSIS — E119 Type 2 diabetes mellitus without complications: Secondary | ICD-10-CM

## 2018-12-13 DIAGNOSIS — Z79899 Other long term (current) drug therapy: Secondary | ICD-10-CM | POA: Diagnosis not present

## 2018-12-13 DIAGNOSIS — Z7984 Long term (current) use of oral hypoglycemic drugs: Secondary | ICD-10-CM | POA: Diagnosis not present

## 2018-12-13 NOTE — Assessment & Plan Note (Signed)
1.  Iron deficiency anemia: -EGD on 05/01/2018 shows multiple gastric nodules, biopsy consistent with gastritis. - He received Feraheme on 05/01/2018 and Injectafer on 05/19/2018 and 05/26/2018. -Denies any bleeding per rectum or melena. -We reviewed his blood work.  Hemoglobin was 14.1 and ferritin was 20. -As the ferritin was dropping again, I have recommended 2 infusions of Feraheme weekly.  We discussed about side effects in detail. - I will see him back in 6 months for follow-up with repeat labs.

## 2018-12-13 NOTE — Progress Notes (Signed)
[place], [place] [address]   CLINIC:  Medical Oncology/Hematology  PCP:  Caprice Renshaw, [place] [address] [phone]   REASON FOR VISIT:  Follow-up for iron deficiency anemia     INTERVAL HISTORY:  [name] 73 y.o. male returns for routine follow-up and consideration for next cycle of chemotherapy. He is here today with a caregiver. He states that he has been feeling pretty good. He denies any constipation. Denies any nausea, vomiting, or diarrhea. Denies any new pains. Had not noticed any recent bleeding such as epistaxis, hematuria or hematochezia. Denies recent chest pain on exertion, shortness of breath on minimal exertion, pre-syncopal episodes, or palpitations. Denies any numbness or tingling in hands or feet. Denies any recent fevers, infections, or recent hospitalizations. Patient reports appetite at 100% and energy level at 100%.   REVIEW OF SYSTEMS:  Review of Systems  All other systems reviewed and are negative.    PAST MEDICAL/SURGICAL HISTORY:  Past Medical History:  Diagnosis Date  . Bell's palsy   . Bleeding ulcer    remote past  . Dementia    disoriented to time.does not know medical hx.information obtained from previous charts  . Hyperlipemia   . Hypertension   . Obesity   . SOB (shortness of breath) on exertion    Past Surgical History:  Procedure Laterality Date  . COLONOSCOPY  10/06/06   internal hemorrhoids and anal papilla/poor prep  . ESOPHAGOGASTRODUODENOSCOPY N/A 05/01/2018   Procedure: ESOPHAGOGASTRODUODENOSCOPY (EGD);  Surgeon: Danie Binder, MD;  Location: [facility];  Service: Endoscopy;  Laterality: N/A;  . FOOT SURGERY    . KNEE ARTHROSCOPY  06/17/2011   Procedure: ARTHROSCOPY KNEE;  Surgeon: Sanjuana Kava;  Location: [facility] ORS;  Service: Orthopedics;  Laterality: Right;     SOCIAL HISTORY:  Social History   Socioeconomic History  . Marital status: Single    Spouse name:  Not on file  . Number of children: Not on file  . Years of education: Not on file  . Highest education level: Not on file  Occupational History  . Not on file  Social Needs  . Financial resource strain: Not on file  . Food insecurity:    Worry: Not on file    Inability: Not on file  . Transportation needs:    Medical: Not on file    Non-medical: Not on file  Tobacco Use  . Smoking status: Former Smoker    Packs/day: 1.00    Years: 30.00    Pack years: 30.00    Types: Cigarettes  . Smokeless tobacco: Never Used  . Tobacco comment: quit a "long time ago"   Substance and Sexual Activity  . Alcohol use: No  . Drug use: No  . Sexual activity: Never  Lifestyle  . Physical activity:    Days per week: Not on file    Minutes per session: Not on file  . Stress: Not on file  Relationships  . Social connections:    Talks on phone: Not on file    Gets together: Not on file    Attends religious service: Not on file    Active member of club or organization: Not on file    Attends meetings of clubs or organizations: Not on file    Relationship status: Not on file  . Intimate partner violence:    Fear of current or ex partner: Not on file    Emotionally abused:  Not on file    Physically abused: Not on file    Forced sexual activity: Not on file  Other Topics Concern  . Not on file  Social History Narrative  . Not on file    FAMILY HISTORY:  Family History  Problem Relation Age of Onset  . COPD Mother   . Cancer Brother   . Anesthesia problems Neg Hx   . Hypotension Neg Hx   . Malignant hyperthermia Neg Hx   . Pseudochol deficiency Neg Hx   . Colon cancer Neg Hx     CURRENT MEDICATIONS:  Outpatient Encounter Medications as of 12/13/2018  Medication Sig  . acetaminophen (QC ARTHRITIS PAIN RELIEF) 650 MG CR tablet Take 650 mg by mouth every 8 (eight) hours as needed for pain.   . Ascorbic Acid 500 MG CHEW Chew 1 tablet by mouth daily.  Marland Kitchen atenolol (TENORMIN) 25 MG tablet  Take 1 tablet (25 mg total) by mouth daily.  Marland Kitchen atorvastatin (LIPITOR) 10 MG tablet Take 10 mg by mouth at bedtime.   . cadexomer iodine (IODOSORB) 0.9 % gel Apply 1 application topically. Apply to left plantar foot topically every shift to promote wound healing  . carboxymethylcellulose (REFRESH TEARS) 0.5 % SOLN Apply 1 drop to eye 2 (two) times daily.   . cholecalciferol (VITAMIN D) 400 units TABS tablet Take 400 Units by mouth daily.  . ferrous sulfate 325 (65 FE) MG EC tablet Take 325 mg by mouth 2 (two) times daily.  . furosemide (LASIX) 40 MG tablet Take 40 mg by mouth daily.   Marland Kitchen ipratropium-albuterol (DUONEB) 0.5-2.5 (3) MG/3ML SOLN Take 3 mLs by nebulization every 8 (eight) hours as needed (for shortness of breath).  Marland Kitchen ketoconazole (NIZORAL) 2 % shampoo Apply 1 application topically daily as needed for irritation (to scalp).  . magnesium hydroxide (MILK OF MAGNESIA) 400 MG/5ML suspension Take 30 mLs by mouth daily as needed for mild constipation or moderate constipation. For constipation   . metFORMIN (GLUCOPHAGE) 500 MG tablet Take 500 mg by mouth at bedtime.  . mometasone (ELOCON) 0.1 % cream Apply 1 application topically daily as needed (for dry skin).  . nystatin (MYCOSTATIN/NYSTOP) powder Apply topically 2 (two) times daily. Apply to groin and penis topically every day and evening shift to promote skin healing  . nystatin-triamcinolone (MYCOLOG II) cream Apply 1 application topically daily as needed (for rash).  . potassium chloride SA (K-DUR,KLOR-CON) 20 MEQ tablet Take 20 mEq by mouth 2 (two) times daily.   . vitamin B-12 (CYANOCOBALAMIN) 1000 MCG tablet Take 1,000 mcg by mouth daily.   No facility-administered encounter medications on file as of 12/13/2018.     ALLERGIES:  Allergies  Allergen Reactions  . Hydrocodone-Acetaminophen     unknown     PHYSICAL EXAM:  ECOG Performance status: 2  Vitals:   12/13/18 1106  BP: (!) 131/57  Pulse: 74  Resp: 18  Temp: 97.8 F  (36.6 C)  SpO2: 98%   Filed Weights    Physical Exam Constitutional:      Appearance: Normal appearance.  Cardiovascular:     Rate and Rhythm: Normal rate and regular rhythm.  Pulmonary:     Effort: Pulmonary effort is normal.     Breath sounds: Normal breath sounds.  Neurological:     Mental Status: He is alert and oriented to person, place, and time.  Psychiatric:        Mood and Affect: Mood normal.  Behavior: Behavior normal.      LABORATORY DATA:  I have reviewed the labs as listed.  CBC    Component Value Date/Time   WBC 5.6 12/06/2018 1043   RBC 4.64 12/06/2018 1043   HGB 14.1 12/06/2018 1043   HCT 44.1 12/06/2018 1043   PLT 178 12/06/2018 1043   MCV 95.0 12/06/2018 1043   MCH 30.4 12/06/2018 1043   MCHC 32.0 12/06/2018 1043   RDW 13.2 12/06/2018 1043   LYMPHSABS 1.7 12/06/2018 1043   MONOABS 0.5 12/06/2018 1043   EOSABS 0.2 12/06/2018 1043   BASOSABS 0.0 12/06/2018 1043   CMP Latest Ref Rng & Units 12/06/2018 05/02/2018 05/01/2018  Glucose 70 - 99 mg/dL 130(H) 82 101(H)  BUN 8 - 23 mg/dL 18 17 16   Creatinine 0.61 - 1.24 mg/dL 0.81 0.77 0.81  Sodium 135 - 145 mmol/L 141 139 138  Potassium 3.5 - 5.1 mmol/L 3.8 3.7 4.0  Chloride 98 - 111 mmol/L 103 105 102  CO2 22 - 32 mmol/L 29 28 30   Calcium 8.9 - 10.3 mg/dL 9.2 8.5(L) 8.4(L)  Total Protein 6.5 - 8.1 g/dL 7.1 - -  Total Bilirubin 0.3 - 1.2 mg/dL 0.7 - -  Alkaline Phos 38 - 126 U/L 103 - -  AST 15 - 41 U/L 20 - -  ALT 0 - 44 U/L 18 - -       DIAGNOSTIC IMAGING:  I have independently reviewed the scans and discussed with the patient.   I have reviewed Venita Lick LPN's note and agree with the documentation.  I personally performed a face-to-face visit, made revisions and my assessment and plan is as follows.    ASSESSMENT & PLAN:   Iron deficiency anemia due to chronic blood loss 1.  Iron deficiency anemia: -EGD on 05/01/2018 shows multiple gastric nodules, biopsy consistent with  gastritis. - He received Feraheme on 05/01/2018 and Injectafer on 05/19/2018 and 05/26/2018. -Denies any bleeding per rectum or melena. -We reviewed his blood work.  Hemoglobin was 14.1 and ferritin was 20. -As the ferritin was dropping again, I have recommended 2 infusions of Feraheme weekly.  We discussed about side effects in detail. - I will see him back in 6 months for follow-up with repeat labs.      Orders placed this encounter:  Orders Placed This Encounter  Procedures  . CBC with Differential/Platelet  . Comprehensive metabolic panel  . Iron and TIBC  . Ferritin      Derek Jack, MD [place] [phone]

## 2018-12-13 NOTE — Patient Instructions (Addendum)
[place] at [facility] Discharge Instructions  You were seen today by Dr. Delton Coombes. He went over your recent lab results. Your iron level is still low, so we need to give you some IV iron infusions. He will see you back in 1 week for iron infusion. He will see you back in 6 months labs and follow up.   Thank you for choosing [place] at [facility] to provide your oncology and hematology care.  To afford each patient quality time with our provider, please arrive at least 15 minutes before your scheduled appointment time.   If you have a lab appointment with the [facility] please come in thru the  Main Entrance and check in at the main information desk  You need to re-schedule your appointment should you arrive 10 or more minutes late.  We strive to give you quality time with our providers, and arriving late affects you and other patients whose appointments are after yours.  Also, if you no show three or more times for appointments you may be dismissed from the clinic at the providers discretion.     Again, thank you for choosing [facility].  Our hope is that these requests will decrease the amount of time that you wait before being seen by our physicians.       _____________________________________________________________  Should you have questions after your visit to [facility], please contact our office at [phone] between the hours of 8:00 a.m. and 4:30 p.m.  Voicemails left after 4:00 p.m. will not be returned until the following business day.  For prescription refill requests, have your pharmacy contact our office and allow 72 hours.    Cancer Center Support Programs:   > Cancer Support Group  2nd Tuesday of the month 1pm-2pm, Journey Room

## 2018-12-15 ENCOUNTER — Telehealth: Payer: Self-pay | Admitting: Gastroenterology

## 2018-12-15 NOTE — Telephone Encounter (Signed)
Patient is due for RMR only ov for IDA. Please schedule nonurgent RMR appt.   I reviewed his 11/2018 labs, Hgb better, ferritin still low.

## 2018-12-18 ENCOUNTER — Encounter: Payer: Self-pay | Admitting: Internal Medicine

## 2018-12-18 NOTE — Telephone Encounter (Signed)
PATIENT SCHEDULED AND LETTER SENT  °

## 2018-12-19 DIAGNOSIS — E08621 Diabetes mellitus due to underlying condition with foot ulcer: Secondary | ICD-10-CM | POA: Diagnosis not present

## 2018-12-19 DIAGNOSIS — R278 Other lack of coordination: Secondary | ICD-10-CM | POA: Diagnosis not present

## 2018-12-19 DIAGNOSIS — M6281 Muscle weakness (generalized): Secondary | ICD-10-CM | POA: Diagnosis not present

## 2018-12-21 ENCOUNTER — Encounter (HOSPITAL_COMMUNITY): Payer: Self-pay

## 2018-12-21 ENCOUNTER — Inpatient Hospital Stay (HOSPITAL_COMMUNITY): Payer: Medicare Other

## 2018-12-21 ENCOUNTER — Other Ambulatory Visit: Payer: Self-pay

## 2018-12-21 VITALS — BP 116/57 | HR 77 | Temp 98.2°F | Resp 18

## 2018-12-21 DIAGNOSIS — D500 Iron deficiency anemia secondary to blood loss (chronic): Secondary | ICD-10-CM

## 2018-12-21 DIAGNOSIS — Z79899 Other long term (current) drug therapy: Secondary | ICD-10-CM | POA: Diagnosis not present

## 2018-12-21 DIAGNOSIS — D509 Iron deficiency anemia, unspecified: Secondary | ICD-10-CM | POA: Diagnosis not present

## 2018-12-21 DIAGNOSIS — E119 Type 2 diabetes mellitus without complications: Secondary | ICD-10-CM | POA: Diagnosis not present

## 2018-12-21 DIAGNOSIS — Z7984 Long term (current) use of oral hypoglycemic drugs: Secondary | ICD-10-CM | POA: Diagnosis not present

## 2018-12-21 DIAGNOSIS — Z87891 Personal history of nicotine dependence: Secondary | ICD-10-CM | POA: Diagnosis not present

## 2018-12-21 MED ORDER — SODIUM CHLORIDE 0.9 % IV SOLN
Freq: Once | INTRAVENOUS | Status: AC
  Administered 2018-12-21: 09:00:00 via INTRAVENOUS

## 2018-12-21 MED ORDER — SODIUM CHLORIDE 0.9 % IV SOLN
510.0000 mg | Freq: Once | INTRAVENOUS | Status: AC
  Administered 2018-12-21: 510 mg via INTRAVENOUS
  Filled 2018-12-21: qty 510

## 2018-12-21 NOTE — Progress Notes (Signed)
Patient tolerated iron infusion with no complaints voiced.  Peripheral IV with good blood return noted before and after infusion.  No bruising or swelling noted at site.  VSs with discharge and left ambulatory with no s/s of distress noted.

## 2018-12-21 NOTE — Patient Instructions (Addendum)
[place] at [facility]  Discharge Instructions:  _______________________________________________________________  Thank you for choosing [place] at [facility] to provide your oncology and hematology care.  To afford each patient quality time with our providers, please arrive at least 15 minutes before your scheduled appointment.  You need to re-schedule your appointment if you arrive 10 or more minutes late.  We strive to give you quality time with our providers, and arriving late affects you and other patients whose appointments are after yours.  Also, if you no show three or more times for appointments you may be dismissed from the clinic.  Again, thank you for choosing [facility] [place] hope is that these requests will allow you access to exceptional care and in a timely manner. _______________________________________________________________  If you have questions after your visit, please contact our office at [phone] between the hours of 8:30 a.m. and 5:00 p.m. Voicemails left after 4:30 p.m. will not be returned until the following business day. _______________________________________________________________  For prescription refill requests, have your pharmacy contact our office. _______________________________________________________________  Recommendations made by the consultant and any test results will be sent to your referring physician. _______________________________________________________________  Ferumoxytol injection What is this medicine? FERUMOXYTOL is an iron complex. Iron is used to make healthy red blood cells, which carry oxygen and nutrients throughout the body. This medicine is used to treat iron deficiency anemia. This medicine may be used for other purposes; ask your health care provider or pharmacist if you have questions. COMMON BRAND NAME(S): Feraheme What should I tell  my health care provider before I take this medicine? They need to know if you have any of these conditions: -anemia not caused by low iron levels -high levels of iron in the blood -magnetic resonance imaging (MRI) test scheduled -an unusual or allergic reaction to iron, other medicines, foods, dyes, or preservatives -pregnant or trying to get pregnant -breast-feeding How should I use this medicine? This medicine is for injection into a vein. It is given by a health care professional in a hospital or clinic setting. Talk to your pediatrician regarding the use of this medicine in children. Special care may be needed. Overdosage: If you think you have taken too much of this medicine contact a poison control center or emergency room at once. NOTE: This medicine is only for you. Do not share this medicine with others. What if I miss a dose? It is important not to miss your dose. Call your doctor or health care professional if you are unable to keep an appointment. What may interact with this medicine? This medicine may interact with the following medications: -other iron products This list may not describe all possible interactions. Give your health care provider a list of all the medicines, herbs, non-prescription drugs, or dietary supplements you use. Also tell them if you smoke, drink alcohol, or use illegal drugs. Some items may interact with your medicine. What should I watch for while using this medicine? Visit your doctor or healthcare professional regularly. Tell your doctor or healthcare professional if your symptoms do not start to get better or if they get worse. You may need blood work done while you are taking this medicine. You may need to follow a special diet. Talk to your doctor. Foods that contain iron include: whole grains/cereals, dried fruits, beans, or peas, leafy green vegetables, and organ meats (liver, kidney). What side effects may I notice from receiving  this  medicine? Side effects that you should report to your doctor or health care professional as soon as possible: -allergic reactions like skin rash, itching or hives, swelling of the face, lips, or tongue -breathing problems -changes in blood pressure -feeling faint or lightheaded, falls -fever or chills -flushing, sweating, or hot feelings -swelling of the ankles or feet Side effects that usually do not require medical attention (report to your doctor or health care professional if they continue or are bothersome): -diarrhea -headache -nausea, vomiting -stomach pain This list may not describe all possible side effects. Call your doctor for medical advice about side effects. You may report side effects to FDA at 1-800-FDA-1088. Where should I keep my medicine? This drug is given in a hospital or clinic and will not be stored at home. NOTE: This sheet is a summary. It may not cover all possible information. If you have questions about this medicine, talk to your doctor, pharmacist, or health care provider.  2019 Elsevier/Gold Standard (2016-11-01 20:21:10)

## 2018-12-26 DIAGNOSIS — M6281 Muscle weakness (generalized): Secondary | ICD-10-CM | POA: Diagnosis not present

## 2018-12-26 DIAGNOSIS — R278 Other lack of coordination: Secondary | ICD-10-CM | POA: Diagnosis not present

## 2018-12-26 DIAGNOSIS — E08621 Diabetes mellitus due to underlying condition with foot ulcer: Secondary | ICD-10-CM | POA: Diagnosis not present

## 2018-12-27 ENCOUNTER — Other Ambulatory Visit: Payer: Self-pay

## 2018-12-28 ENCOUNTER — Inpatient Hospital Stay (HOSPITAL_COMMUNITY): Payer: Medicare Other | Attending: Internal Medicine

## 2018-12-28 DIAGNOSIS — K922 Gastrointestinal hemorrhage, unspecified: Secondary | ICD-10-CM | POA: Insufficient documentation

## 2018-12-28 DIAGNOSIS — D500 Iron deficiency anemia secondary to blood loss (chronic): Secondary | ICD-10-CM | POA: Insufficient documentation

## 2018-12-28 NOTE — Progress Notes (Signed)
IV Feraheme infusion rescheduled. 4 RNs unsuccessful with IV insertion. Pt discharged via wheelchair in satisfactory condition.

## 2018-12-28 NOTE — Patient Instructions (Addendum)
[place] at [facility]  Discharge Instructions:  Feraheme infusion rescheduled. Unable to successfully insert IV. _______________________________________________________________  Thank you for choosing [place] at [facility] to provide your oncology and hematology care.  To afford each patient quality time with our providers, please arrive at least 15 minutes before your scheduled appointment.  You need to re-schedule your appointment if you arrive 10 or more minutes late.  We strive to give you quality time with our providers, and arriving late affects you and other patients whose appointments are after yours.  Also, if you no show three or more times for appointments you may be dismissed from the clinic.  Again, thank you for choosing [place] at [place] hope is that these requests will allow you access to exceptional care and in a timely manner. _______________________________________________________________  If you have questions after your visit, please contact our office at [phone] between the hours of 8:30 a.m. and 5:00 p.m. Voicemails left after 4:30 p.m. will not be returned until the following business day. _______________________________________________________________  For prescription refill requests, have your pharmacy contact our office. _______________________________________________________________  Recommendations made by the consultant and any test results will be sent to your referring physician. _______________________________________________________________

## 2019-01-02 ENCOUNTER — Encounter (HOSPITAL_COMMUNITY): Payer: Self-pay

## 2019-01-02 ENCOUNTER — Other Ambulatory Visit: Payer: Self-pay

## 2019-01-02 ENCOUNTER — Inpatient Hospital Stay (HOSPITAL_COMMUNITY): Payer: Medicare Other

## 2019-01-02 VITALS — BP 148/65 | HR 79 | Temp 97.3°F | Resp 18

## 2019-01-02 DIAGNOSIS — R278 Other lack of coordination: Secondary | ICD-10-CM | POA: Diagnosis not present

## 2019-01-02 DIAGNOSIS — E08621 Diabetes mellitus due to underlying condition with foot ulcer: Secondary | ICD-10-CM | POA: Diagnosis not present

## 2019-01-02 DIAGNOSIS — D500 Iron deficiency anemia secondary to blood loss (chronic): Secondary | ICD-10-CM | POA: Diagnosis not present

## 2019-01-02 DIAGNOSIS — M6281 Muscle weakness (generalized): Secondary | ICD-10-CM | POA: Diagnosis not present

## 2019-01-02 DIAGNOSIS — K922 Gastrointestinal hemorrhage, unspecified: Secondary | ICD-10-CM | POA: Diagnosis not present

## 2019-01-02 MED ORDER — SODIUM CHLORIDE 0.9 % IV SOLN
Freq: Once | INTRAVENOUS | Status: AC
  Administered 2019-01-02: 12:00:00 via INTRAVENOUS

## 2019-01-02 MED ORDER — SODIUM CHLORIDE 0.9 % IV SOLN
510.0000 mg | Freq: Once | INTRAVENOUS | Status: AC
  Administered 2019-01-02: 12:00:00 510 mg via INTRAVENOUS
  Filled 2019-01-02: qty 510

## 2019-01-02 NOTE — Progress Notes (Signed)
Feraheme given today per orders.  Patient tolerated it well without problems. Vitals stable and discharged home from clinic via wheelchair.  Follow up as scheduled.

## 2019-01-02 NOTE — Patient Instructions (Signed)
[facility] at [facility]  Discharge Instructions:   _______________________________________________________________  Thank you for choosing [facility] to provide your oncology and hematology care.  To afford each patient quality time with our providers, please arrive at least 15 minutes before your scheduled appointment.  You need to re-schedule your appointment if you arrive 10 or more minutes late.  We strive to give you quality time with our providers, and arriving late affects you and other patients whose appointments are after yours.  Also, if you no show three or more times for appointments you may be dismissed from the clinic.  Again, thank you for choosing  [facility]. Our hope is that these requests will allow you access to exceptional care and in a timely manner. _______________________________________________________________  If you have questions after your visit, please contact our office at [phone] between the hours of 8:30 a.m. and 5:00 p.m. Voicemails left after 4:30 p.m. will not be returned until the following business day. _______________________________________________________________  For prescription refill requests, have your pharmacy contact our office. _______________________________________________________________  Recommendations made by the consultant and any test results will be sent to your referring physician. _______________________________________________________________ 

## 2019-01-11 DIAGNOSIS — I10 Essential (primary) hypertension: Secondary | ICD-10-CM | POA: Diagnosis not present

## 2019-01-11 DIAGNOSIS — E785 Hyperlipidemia, unspecified: Secondary | ICD-10-CM | POA: Diagnosis not present

## 2019-01-11 DIAGNOSIS — K219 Gastro-esophageal reflux disease without esophagitis: Secondary | ICD-10-CM | POA: Diagnosis not present

## 2019-01-11 DIAGNOSIS — E119 Type 2 diabetes mellitus without complications: Secondary | ICD-10-CM | POA: Diagnosis not present

## 2019-02-14 DIAGNOSIS — E785 Hyperlipidemia, unspecified: Secondary | ICD-10-CM | POA: Diagnosis not present

## 2019-02-14 DIAGNOSIS — E538 Deficiency of other specified B group vitamins: Secondary | ICD-10-CM | POA: Diagnosis not present

## 2019-02-14 DIAGNOSIS — I10 Essential (primary) hypertension: Secondary | ICD-10-CM | POA: Diagnosis not present

## 2019-03-20 ENCOUNTER — Ambulatory Visit: Payer: Self-pay | Admitting: Internal Medicine

## 2019-03-20 ENCOUNTER — Other Ambulatory Visit: Payer: Self-pay

## 2019-03-20 ENCOUNTER — Ambulatory Visit (INDEPENDENT_AMBULATORY_CARE_PROVIDER_SITE_OTHER): Payer: Medicare Other | Admitting: Internal Medicine

## 2019-03-20 ENCOUNTER — Encounter: Payer: Self-pay | Admitting: Internal Medicine

## 2019-03-20 VITALS — BP 139/77 | HR 93 | Temp 97.0°F | Ht 67.0 in | Wt 257.2 lb

## 2019-03-20 DIAGNOSIS — R195 Other fecal abnormalities: Secondary | ICD-10-CM | POA: Diagnosis not present

## 2019-03-20 DIAGNOSIS — D500 Iron deficiency anemia secondary to blood loss (chronic): Secondary | ICD-10-CM | POA: Diagnosis not present

## 2019-03-20 NOTE — Patient Instructions (Addendum)
Schedule a diagnostic colonoscopy - conscious sedation;  Hem positive stool ; IDA  Will need overnight observation and help with the prep  Will need to start clear liquids 2 days before procedure  Linzess 290 daily x 3 days prior to procedure   Hold metformin the night before the procedure

## 2019-03-20 NOTE — Progress Notes (Signed)
Primary Care Physician:  Caprice Renshaw, MD Primary Gastroenterologist:  Dr. Gala Romney  Pre-Procedure History & Physical: HPI:  [name] is a 73 y.o. male nursing home resident for further evaluation of iron deficiency anemia.  Patient was Hemoccult positive last year along with anemia.  EGD revealed intestinal metaplasia no tumor no ulcer H. pylori stains were negative.  He was referred back to the office for further valuation.  He returns just today.  He has well-documented iron deficiency anemia.  He is receiving parenteral iron through hematology on the fourth floor.  Last ferritin in March of this year was 20  His hemoglobin actually come up to 14 with parenteral iron.  There is been no rectal bleeding, melena constipation or diarrhea.  Patient denies abdominal pain or change in bowel habits.  Patient is demented.  His care caregiver corroborates this report.  No diminution in appetite no apparent dysphagia nausea or vomiting.  Last colonoscopy done back in 2008-revealed relatively poor prep internal hemorrhoids and anal papilla.   He is a diabetic on metformin.  He is taking no antiplatelet or anticoagulation therapy.  Past Medical History:  Diagnosis Date  . Bell's palsy   . Bleeding ulcer    remote past  . Dementia    disoriented to time.does not know medical hx.information obtained from previous charts  . Hyperlipemia   . Hypertension   . Obesity   . SOB (shortness of breath) on exertion     Past Surgical History:  Procedure Laterality Date  . COLONOSCOPY  10/06/06   internal hemorrhoids and anal papilla/poor prep  . ESOPHAGOGASTRODUODENOSCOPY N/A 05/01/2018   Procedure: ESOPHAGOGASTRODUODENOSCOPY (EGD);  Surgeon: Danie Binder, MD;  [facility]: [facility];  Service: Endoscopy;  Laterality: N/A;  . FOOT SURGERY    . KNEE ARTHROSCOPY  06/17/2011   Procedure: ARTHROSCOPY KNEE;  Surgeon: Sanjuana Kava;  Location: [facility];  Service: Orthopedics;  Laterality: Right;    Prior to  Admission medications   Medication Sig Start Date End Date Taking? Authorizing Provider  acetaminophen (QC ARTHRITIS PAIN RELIEF) 650 MG CR tablet Take 650 mg by mouth every 8 (eight) hours as needed for pain.    Yes [provider]  Ascorbic Acid 500 MG CHEW Chew 1 tablet by mouth daily.   Yes [provider]  atenolol (TENORMIN) 25 MG tablet Take 1 tablet (25 mg total) by mouth daily. 05/03/18  Yes Tat, Shanon Brow, MD  carboxymethylcellulose (REFRESH TEARS) 0.5 % SOLN Apply 1 drop to eye 2 (two) times daily.    Yes [provider]  furosemide (LASIX) 40 MG tablet Take 40 mg by mouth daily.    Yes [provider]  ipratropium-albuterol (DUONEB) 0.5-2.5 (3) MG/3ML SOLN Take 3 mLs by nebulization every 8 (eight) hours as needed (for shortness of breath).   Yes [provider]  ketoconazole (NIZORAL) 2 % shampoo Apply 1 application topically daily as needed for irritation (to scalp).   Yes [provider]  magnesium hydroxide (MILK OF MAGNESIA) 400 MG/5ML suspension Take 30 mLs by mouth daily as needed for mild constipation or moderate constipation. For constipation    Yes [provider]  metFORMIN (GLUCOPHAGE) 500 MG tablet Take 500 mg by mouth at bedtime.   Yes [provider]  mometasone (ELOCON) 0.1 % cream Apply 1 application topically daily as needed (for dry skin).   Yes [provider]  nystatin (MYCOSTATIN/NYSTOP) powder Apply topically 2 (two) times daily. Apply to  groin and penis topically every day and evening shift to promote skin healing   Yes [provider]  nystatin-triamcinolone (MYCOLOG II) cream Apply 1 application topically daily as needed (for rash).   Yes [provider]  potassium chloride SA (K-DUR,KLOR-CON) 20 MEQ tablet Take 20 mEq by mouth 2 (two) times daily.    Yes [provider]  atorvastatin (LIPITOR) 10 MG tablet Take 10 mg by mouth at bedtime.     [provider]  cadexomer iodine (IODOSORB) 0.9 % gel Apply 1 application topically. Apply to left plantar foot topically every shift to promote wound healing    [provider]  cholecalciferol (VITAMIN D) 400 units TABS tablet Take 400 Units by mouth daily.    [provider]  ferrous sulfate 325 (65 FE) MG EC tablet Take 325 mg by mouth 2 (two) times daily.    [provider]  vitamin B-12 (CYANOCOBALAMIN) 1000 MCG tablet Take 1,000 mcg by mouth daily.    [provider]    Allergies as of 03/20/2019 - Review Complete 03/20/2019  Allergen Reaction Noted  . Hydrocodone-acetaminophen  06/17/2011    Family History  Problem Relation Age of Onset  . COPD Mother   . Cancer Brother   . Anesthesia problems Neg Hx   . Hypotension Neg Hx   . Malignant hyperthermia Neg Hx   . Pseudochol deficiency Neg Hx   . Colon cancer Neg Hx     Social History   Socioeconomic History  . Marital status: Single    Spouse name: Not on file  . Number of children: Not on file  . Years of education: Not on file  . Highest education level: Not on file  Occupational History  . Not on file  Social Needs  . Financial resource strain: Not on file  . Food insecurity    Worry: Not on file    Inability: Not on file  . Transportation needs    Medical: Not on file    Non-medical: Not on file  Tobacco Use  . Smoking status: Former Smoker    Packs/day: 1.00    Years: 30.00    Pack years: 30.00    Types: Cigarettes  . Smokeless tobacco: Never Used  . Tobacco comment: quit a "long time ago"   Substance and Sexual Activity  . Alcohol use: No  . Drug use: No  . Sexual activity: Never  Lifestyle  . Physical activity    Days per week: Not on file    Minutes per session: Not on file  . Stress: Not on file  Relationships  . Social Herbalist on phone: Not on file    Gets together: Not on file    Attends religious service: Not on file    Active member of club or  organization: Not on file    Attends meetings of clubs or organizations: Not on file    Relationship status: Not on file  . Intimate partner violence    Fear of current or ex partner: Not on file    Emotionally abused: Not on file    Physically abused: Not on file    Forced sexual activity: Not on file  Other Topics Concern  . Not on file  Social History Narrative  . Not on file    Review of Systems: See HPI, otherwise negative ROS  Physical Exam: BP 139/77   Pulse 93   Temp (!) 97 F (36.1  C) (Oral)   Ht 5\' 7"  (1.702 m)   Wt 257 lb 3.2 oz (116.7 kg)   BMI 40.28 kg/m  General:   Debilitated appearing gentleman confined to a wheelchair.  He is conversant and answers questions with short responses.  Accompanied by his caregiver Lynita Lombard.  Right hemiparesis present Neck:  Supple; no masses or thyromegaly. No significant cervical adenopathy. Lungs:  Clear throughout to auscultation.   No wheezes, crackles, or rhonchi. No acute distress. Heart:  Regular rate and rhythm; no murmurs, clicks, rubs,  or gallops. Abdomen: Obese.  Non-distended, normal bowel sounds.  Soft and nontender without appreciable mass or hepatosplenomegaly.  Pulses:  Normal pulses noted. Extremities:  Without clubbing or edema. Rectal: Deferred until the time of colonoscopy.   Impression: Pleasant 73 year old gentleman dimension multiple comorbidities referred for further evaluation of iron deficiency anemia/Hemoccult positive stool.  EGD last year revealed gastric intestinal metaplasia but no evidence of H. pylori ulcer or neoplasm.  He clearly has iron deficiency anemia the etiology of which is not been determined.  He has been documented to be Hemoccult positive.  To complete the GI evaluation he ought to undergo a diagnostic colonoscopy.  The risks, benefits, limitations, alternatives and imponderables have been reviewed with the patient. Questions have been answered. All parties are agreeable.    Debilitation will make preparation a challenge.  I feel it would be best to have the patient brought into the hospital in observation the night before to facilitate adequate preparation (prep was relatively poor the last colonoscopy)  Recommendations:  Schedule a diagnostic colonoscopy - conscious sedation;  Hem positive stool ; IDA  Will need overnight observation and help with the prep  Will need to start clear liquids 2 days before procedure  Linzess 290 daily x 3 days prior to procedure   Hold metformin the night before the procedure  Notice: This dictation was prepared with Dragon dictation along with smaller phrase technology. Any transcriptional errors that result from this process are unintentional and may not be corrected upon review.

## 2019-04-18 ENCOUNTER — Other Ambulatory Visit: Payer: Self-pay

## 2019-04-19 ENCOUNTER — Telehealth: Payer: Self-pay

## 2019-04-19 NOTE — Telephone Encounter (Signed)
Spoke to [facility] at [facility]) yesterday. Pt to be admitted 05/08/19 for TCS prep. Tiffany advised pt would receive rapid COVID test the morning of admission. He would be on droplet precautions until test results are completed. Tiffany wasn't sure if order could be placed prior to admission. Order entered for rapid COVID test if needed.

## 2019-04-27 ENCOUNTER — Telehealth: Payer: Self-pay

## 2019-04-27 NOTE — Telephone Encounter (Signed)
[employer] and spoke to nurse April. Confirmed pt will be having TCS 05/09/19 with admission for prep 05/08/19. She also confirmed receiving instructions.

## 2019-04-28 DIAGNOSIS — C180 Malignant neoplasm of cecum: Secondary | ICD-10-CM

## 2019-04-28 HISTORY — DX: Malignant neoplasm of cecum: C18.0

## 2019-05-03 DIAGNOSIS — E119 Type 2 diabetes mellitus without complications: Secondary | ICD-10-CM | POA: Diagnosis not present

## 2019-05-03 DIAGNOSIS — I10 Essential (primary) hypertension: Secondary | ICD-10-CM | POA: Diagnosis not present

## 2019-05-03 DIAGNOSIS — K219 Gastro-esophageal reflux disease without esophagitis: Secondary | ICD-10-CM | POA: Diagnosis not present

## 2019-05-03 DIAGNOSIS — R293 Abnormal posture: Secondary | ICD-10-CM | POA: Diagnosis not present

## 2019-05-03 DIAGNOSIS — M6281 Muscle weakness (generalized): Secondary | ICD-10-CM | POA: Diagnosis not present

## 2019-05-03 DIAGNOSIS — E785 Hyperlipidemia, unspecified: Secondary | ICD-10-CM | POA: Diagnosis not present

## 2019-05-03 DIAGNOSIS — F79 Unspecified intellectual disabilities: Secondary | ICD-10-CM | POA: Diagnosis not present

## 2019-05-03 DIAGNOSIS — M1990 Unspecified osteoarthritis, unspecified site: Secondary | ICD-10-CM | POA: Diagnosis not present

## 2019-05-04 DIAGNOSIS — R293 Abnormal posture: Secondary | ICD-10-CM | POA: Diagnosis not present

## 2019-05-04 DIAGNOSIS — M1990 Unspecified osteoarthritis, unspecified site: Secondary | ICD-10-CM | POA: Diagnosis not present

## 2019-05-04 DIAGNOSIS — M6281 Muscle weakness (generalized): Secondary | ICD-10-CM | POA: Diagnosis not present

## 2019-05-04 DIAGNOSIS — F79 Unspecified intellectual disabilities: Secondary | ICD-10-CM | POA: Diagnosis not present

## 2019-05-07 ENCOUNTER — Telehealth: Payer: Self-pay

## 2019-05-07 ENCOUNTER — Telehealth: Payer: Self-pay | Admitting: Internal Medicine

## 2019-05-07 DIAGNOSIS — E119 Type 2 diabetes mellitus without complications: Secondary | ICD-10-CM | POA: Diagnosis not present

## 2019-05-07 DIAGNOSIS — E08621 Diabetes mellitus due to underlying condition with foot ulcer: Secondary | ICD-10-CM | POA: Diagnosis not present

## 2019-05-07 DIAGNOSIS — N184 Chronic kidney disease, stage 4 (severe): Secondary | ICD-10-CM | POA: Diagnosis not present

## 2019-05-07 DIAGNOSIS — R293 Abnormal posture: Secondary | ICD-10-CM | POA: Diagnosis not present

## 2019-05-07 DIAGNOSIS — M6281 Muscle weakness (generalized): Secondary | ICD-10-CM | POA: Diagnosis not present

## 2019-05-07 DIAGNOSIS — E782 Mixed hyperlipidemia: Secondary | ICD-10-CM | POA: Diagnosis not present

## 2019-05-07 DIAGNOSIS — E559 Vitamin D deficiency, unspecified: Secondary | ICD-10-CM | POA: Diagnosis not present

## 2019-05-07 DIAGNOSIS — E785 Hyperlipidemia, unspecified: Secondary | ICD-10-CM | POA: Diagnosis not present

## 2019-05-07 DIAGNOSIS — D649 Anemia, unspecified: Secondary | ICD-10-CM | POA: Diagnosis not present

## 2019-05-07 DIAGNOSIS — M1990 Unspecified osteoarthritis, unspecified site: Secondary | ICD-10-CM | POA: Diagnosis not present

## 2019-05-07 DIAGNOSIS — D519 Vitamin B12 deficiency anemia, unspecified: Secondary | ICD-10-CM | POA: Diagnosis not present

## 2019-05-07 DIAGNOSIS — F79 Unspecified intellectual disabilities: Secondary | ICD-10-CM | POA: Diagnosis not present

## 2019-05-07 DIAGNOSIS — Z79899 Other long term (current) drug therapy: Secondary | ICD-10-CM | POA: Diagnosis not present

## 2019-05-07 NOTE — Telephone Encounter (Signed)
[phone] BRENDA FROM [name] CALLED AND NEEDS SOMEONE TO CALL HER AND LET HER KNOW WHEN TO HAVE THE PATIENT AT [place]

## 2019-05-07 NOTE — Telephone Encounter (Signed)
Spoke to Savannah, she request for bed placement to call her directly when bed is ready.  Called bed placement and spoke to [facility], gave her Brenda's number to call tomorrow morning when bed is ready. [phone]

## 2019-05-07 NOTE — Telephone Encounter (Signed)
See other phone note. Hassan Rowan (transporter at [facility]) requests for bed placement to call her directly [phone] when bed is ready tomorrow.  Called bed placement and informed Dawn to call Hassan Rowan tomorrow and gave phone number.

## 2019-05-07 NOTE — Telephone Encounter (Signed)
TCS w/RMR scheduled for 05/09/19. To be admitted 05/08/19 for prep. AB spoke to Dr. Irwin Brakeman who accepted admission. Called bed placement, spoke to [facility], informed of admission to [facility] tomorrow. Asked for pt to be admitted by 8:00am if possible. Bed placement will call facility when bed is ready.

## 2019-05-08 ENCOUNTER — Inpatient Hospital Stay (HOSPITAL_COMMUNITY)
Admission: RE | Admit: 2019-05-08 | Discharge: 2019-05-15 | DRG: 330 | Disposition: A | Discharge status: Skilled Nursing Facility | Payer: Medicare Other | Source: Skilled Nursing Facility | Attending: Internal Medicine | Admitting: Internal Medicine

## 2019-05-08 ENCOUNTER — Other Ambulatory Visit: Payer: Self-pay

## 2019-05-08 ENCOUNTER — Encounter (HOSPITAL_COMMUNITY): Payer: Self-pay | Admitting: Gastroenterology

## 2019-05-08 DIAGNOSIS — E785 Hyperlipidemia, unspecified: Secondary | ICD-10-CM | POA: Diagnosis present

## 2019-05-08 DIAGNOSIS — K274 Chronic or unspecified peptic ulcer, site unspecified, with hemorrhage: Secondary | ICD-10-CM

## 2019-05-08 DIAGNOSIS — E119 Type 2 diabetes mellitus without complications: Secondary | ICD-10-CM | POA: Diagnosis present

## 2019-05-08 DIAGNOSIS — K2940 Chronic atrophic gastritis without bleeding: Secondary | ICD-10-CM | POA: Diagnosis not present

## 2019-05-08 DIAGNOSIS — Z7401 Bed confinement status: Secondary | ICD-10-CM

## 2019-05-08 DIAGNOSIS — F0390 Unspecified dementia without behavioral disturbance: Secondary | ICD-10-CM | POA: Diagnosis present

## 2019-05-08 DIAGNOSIS — Z6841 Body Mass Index (BMI) 40.0 and over, adult: Secondary | ICD-10-CM

## 2019-05-08 DIAGNOSIS — K2970 Gastritis, unspecified, without bleeding: Secondary | ICD-10-CM | POA: Diagnosis present

## 2019-05-08 DIAGNOSIS — L89151 Pressure ulcer of sacral region, stage 1: Secondary | ICD-10-CM | POA: Diagnosis present

## 2019-05-08 DIAGNOSIS — K6389 Other specified diseases of intestine: Secondary | ICD-10-CM

## 2019-05-08 DIAGNOSIS — R195 Other fecal abnormalities: Secondary | ICD-10-CM | POA: Diagnosis not present

## 2019-05-08 DIAGNOSIS — N5089 Other specified disorders of the male genital organs: Secondary | ICD-10-CM | POA: Diagnosis present

## 2019-05-08 DIAGNOSIS — R0602 Shortness of breath: Secondary | ICD-10-CM | POA: Diagnosis not present

## 2019-05-08 DIAGNOSIS — E876 Hypokalemia: Secondary | ICD-10-CM | POA: Diagnosis not present

## 2019-05-08 DIAGNOSIS — Z87891 Personal history of nicotine dependence: Secondary | ICD-10-CM

## 2019-05-08 DIAGNOSIS — Z993 Dependence on wheelchair: Secondary | ICD-10-CM

## 2019-05-08 DIAGNOSIS — C180 Malignant neoplasm of cecum: Principal | ICD-10-CM

## 2019-05-08 DIAGNOSIS — Z885 Allergy status to narcotic agent status: Secondary | ICD-10-CM

## 2019-05-08 DIAGNOSIS — E669 Obesity, unspecified: Secondary | ICD-10-CM | POA: Diagnosis present

## 2019-05-08 DIAGNOSIS — I10 Essential (primary) hypertension: Secondary | ICD-10-CM | POA: Diagnosis not present

## 2019-05-08 DIAGNOSIS — D123 Benign neoplasm of transverse colon: Secondary | ICD-10-CM | POA: Diagnosis present

## 2019-05-08 DIAGNOSIS — D500 Iron deficiency anemia secondary to blood loss (chronic): Secondary | ICD-10-CM | POA: Diagnosis not present

## 2019-05-08 DIAGNOSIS — Z5331 Laparoscopic surgical procedure converted to open procedure: Secondary | ICD-10-CM

## 2019-05-08 DIAGNOSIS — Z79899 Other long term (current) drug therapy: Secondary | ICD-10-CM

## 2019-05-08 DIAGNOSIS — K284 Chronic or unspecified gastrojejunal ulcer with hemorrhage: Secondary | ICD-10-CM | POA: Diagnosis present

## 2019-05-08 DIAGNOSIS — R109 Unspecified abdominal pain: Secondary | ICD-10-CM | POA: Diagnosis present

## 2019-05-08 DIAGNOSIS — K922 Gastrointestinal hemorrhage, unspecified: Secondary | ICD-10-CM | POA: Diagnosis present

## 2019-05-08 DIAGNOSIS — Q438 Other specified congenital malformations of intestine: Secondary | ICD-10-CM

## 2019-05-08 DIAGNOSIS — Z20828 Contact with and (suspected) exposure to other viral communicable diseases: Secondary | ICD-10-CM | POA: Diagnosis present

## 2019-05-08 DIAGNOSIS — K2951 Unspecified chronic gastritis with bleeding: Secondary | ICD-10-CM

## 2019-05-08 DIAGNOSIS — Z8711 Personal history of peptic ulcer disease: Secondary | ICD-10-CM

## 2019-05-08 DIAGNOSIS — Z7984 Long term (current) use of oral hypoglycemic drugs: Secondary | ICD-10-CM

## 2019-05-08 LAB — BASIC METABOLIC PANEL
Anion gap: 14 (ref 5–15)
BUN: 14 mg/dL (ref 8–23)
CO2: 22 mmol/L (ref 22–32)
Calcium: 8.6 mg/dL — ABNORMAL LOW (ref 8.9–10.3)
Chloride: 107 mmol/L (ref 98–111)
Creatinine, Ser: 0.7 mg/dL (ref 0.61–1.24)
GFR calc Af Amer: >60 mL/min (ref >60)
GFR calc non Af Amer: >60 mL/min (ref >60)
Glucose, Bld: 117 mg/dL — ABNORMAL HIGH (ref 70–99)
Potassium: 4 mmol/L (ref 3.5–5.1)
Sodium: 143 mmol/L (ref 135–145)

## 2019-05-08 LAB — GLUCOSE, CAPILLARY
Glucose-Capillary: 137 mg/dL — ABNORMAL HIGH (ref 70–99)
Glucose-Capillary: 129 mg/dL — ABNORMAL HIGH (ref 70–99)
Glucose-Capillary: 97 mg/dL (ref 70–99)

## 2019-05-08 LAB — CBC
HCT: 39 % (ref 39.0–52.0)
Hemoglobin: 12.2 g/dL — ABNORMAL LOW (ref 13.0–17.0)
MCH: 29.8 pg (ref 26.0–34.0)
MCHC: 31.3 g/dL (ref 30.0–36.0)
MCV: 95.1 fL (ref 80.0–100.0)
Platelets: 168 10*3/uL (ref 150–400)
RBC: 4.1 MIL/uL — ABNORMAL LOW (ref 4.22–5.81)
RDW: 12.6 % (ref 11.5–15.5)
WBC: 5.8 10*3/uL (ref 4.0–10.5)
nRBC: 0 % (ref 0.0–0.2)

## 2019-05-08 LAB — MRSA PCR SCREENING: MRSA by PCR: NEGATIVE

## 2019-05-08 LAB — CREATININE, SERUM
Creatinine, Ser: 0.62 mg/dL (ref 0.61–1.24)
GFR calc Af Amer: >60 mL/min (ref >60)
GFR calc non Af Amer: >60 mL/min (ref >60)

## 2019-05-08 LAB — HEMOGLOBIN A1C
Hgb A1c MFr Bld: 5.5 % (ref 4.8–5.6)
Mean Plasma Glucose: 111.15 mg/dL

## 2019-05-08 LAB — SARS CORONAVIRUS 2 BY RT PCR (HOSPITAL ORDER, PERFORMED IN ~~LOC~~ HOSPITAL LAB): SARS Coronavirus 2: NEGATIVE

## 2019-05-08 MED ORDER — SODIUM CHLORIDE 0.9% FLUSH: 3.0000 mL | INTRAVENOUS | Status: DC | PRN

## 2019-05-08 MED ORDER — SODIUM CHLORIDE 0.9 % IV SOLN: 250.0000 mL | INTRAVENOUS | Status: DC | PRN

## 2019-05-08 MED ORDER — VITAMIN B-12 1000 MCG PO TABS
1000.0000 ug | ORAL_TABLET | Freq: Every day | ORAL | Status: DC
  Administered 2019-05-08 – 2019-05-15 (×7): 1000 ug via ORAL
  Filled 2019-05-08 (×8): qty 1

## 2019-05-08 MED ORDER — LINACLOTIDE 145 MCG PO CAPS
290.0000 ug | ORAL_CAPSULE | Freq: Once | ORAL | Status: AC
  Administered 2019-05-08: 290 ug via ORAL
  Filled 2019-05-08: qty 2

## 2019-05-08 MED ORDER — TRAZODONE HCL 50 MG PO TABS: 25.0000 mg | ORAL_TABLET | Freq: Every evening | ORAL | Status: DC | PRN

## 2019-05-08 MED ORDER — FUROSEMIDE 40 MG PO TABS
40.0000 mg | ORAL_TABLET | Freq: Every day | ORAL | Status: DC
  Administered 2019-05-08 – 2019-05-15 (×7): 40 mg via ORAL
  Filled 2019-05-08 (×8): qty 1

## 2019-05-08 MED ORDER — IPRATROPIUM-ALBUTEROL 0.5-2.5 (3) MG/3ML IN SOLN: 3.0000 mL | Freq: Three times a day (TID) | RESPIRATORY_TRACT | Status: DC | PRN

## 2019-05-08 MED ORDER — ATORVASTATIN CALCIUM 10 MG PO TABS
10.0000 mg | ORAL_TABLET | Freq: Every day | ORAL | Status: DC
  Administered 2019-05-09 – 2019-05-14 (×6): 10 mg via ORAL
  Filled 2019-05-08 (×7): qty 1

## 2019-05-08 MED ORDER — SODIUM CHLORIDE 0.9% FLUSH
3.0000 mL | Freq: Two times a day (BID) | INTRAVENOUS | Status: DC
  Administered 2019-05-08 – 2019-05-15 (×11): 3 mL via INTRAVENOUS

## 2019-05-08 MED ORDER — FERROUS SULFATE 325 (65 FE) MG PO TABS
325.0000 mg | ORAL_TABLET | Freq: Every day | ORAL | Status: DC
  Filled 2019-05-08: qty 1

## 2019-05-08 MED ORDER — ONDANSETRON HCL 4 MG/2ML IJ SOLN
4.0000 mg | Freq: Four times a day (QID) | INTRAMUSCULAR | Status: DC | PRN
  Filled 2019-05-08: qty 2

## 2019-05-08 MED ORDER — POLYVINYL ALCOHOL 1.4 % OP SOLN
1.0000 [drp] | Freq: Two times a day (BID) | OPHTHALMIC | Status: DC
  Administered 2019-05-08 – 2019-05-15 (×12): 1 [drp] via OPHTHALMIC
  Filled 2019-05-08 (×2): qty 15

## 2019-05-08 MED ORDER — INSULIN ASPART 100 UNIT/ML ~~LOC~~ SOLN
0.0000 [IU] | Freq: Three times a day (TID) | SUBCUTANEOUS | Status: DC
  Administered 2019-05-08 – 2019-05-10 (×3): 1 [IU] via SUBCUTANEOUS
  Administered 2019-05-11 (×2): 2 [IU] via SUBCUTANEOUS
  Administered 2019-05-13: 1 [IU] via SUBCUTANEOUS

## 2019-05-08 MED ORDER — ONDANSETRON HCL 4 MG PO TABS: 4.0000 mg | ORAL_TABLET | Freq: Four times a day (QID) | ORAL | Status: DC | PRN

## 2019-05-08 MED ORDER — ATENOLOL 25 MG PO TABS
25.0000 mg | ORAL_TABLET | Freq: Every day | ORAL | Status: DC
  Administered 2019-05-08 – 2019-05-15 (×6): 25 mg via ORAL
  Filled 2019-05-08 (×8): qty 1

## 2019-05-08 MED ORDER — HEPARIN SODIUM (PORCINE) 5000 UNIT/ML IJ SOLN
5000.0000 [IU] | Freq: Three times a day (TID) | INTRAMUSCULAR | Status: AC
  Administered 2019-05-08: 5000 [IU] via SUBCUTANEOUS
  Filled 2019-05-08: qty 1

## 2019-05-08 MED ORDER — ACETAMINOPHEN 325 MG PO TABS: 650.0000 mg | ORAL_TABLET | Freq: Four times a day (QID) | ORAL | Status: DC | PRN

## 2019-05-08 MED ORDER — ACETAMINOPHEN 650 MG RE SUPP: 650.0000 mg | Freq: Four times a day (QID) | RECTAL | Status: DC | PRN

## 2019-05-08 MED ORDER — PEG 3350-KCL-NA BICARB-NACL 420 G PO SOLR
4000.0000 mL | Freq: Once | ORAL | Status: AC
  Administered 2019-05-08: 4000 mL via ORAL

## 2019-05-08 NOTE — Progress Notes (Signed)
Is alert and oriented x 2, knows name and where he is.  Can communicate appropriately and feed self.  Has drunk almost half of bowel prep by himself.  Telephone consent obtained from legal guardian Hubert Azure DDS for colonoscopy tomorrow.

## 2019-05-08 NOTE — H&P (Addendum)
History and Physical  [name]:[id number] DOB: 07-10-1946 DOA: 05/08/2019  PCP: Caprice Renshaw, MD   Chief Complaint: I am having a procedure  HPI: [name] is a 73 y.o. male with a past medical history of iron deficiency anemia thought to be secondary to chronic blood loss has a history of multiple nodules in the stomach and gastritis but no malignancy.  He has mild dementia and was brought in by family for preparation for colonoscopy.  He doesn't have a full understanding about why he is in the hospital but he is cooperative and willing to accept care.   He has hypertension and hyperlipidemia, Bell's palsy and chronic shortness of breath on exertion.  He also has a remote history of bleeding ulcer.  He reports he has been feeling fairly well.  He denies having black stools or blood in stool.  He is taking daily iron tablets.  He also is receiving IV iron and is followed by hematology.  He denies fever chills cough and chest pain.  His COVID-19 testing is pending.  Review of Systems: UTO due to confusion.   Past Medical History:  Diagnosis Date  . Bell's palsy   . Bleeding ulcer    remote past  . Dementia    disoriented to time.does not know medical hx.information obtained from previous charts  . Hyperlipemia   . Hypertension   . Obesity   . SOB (shortness of breath) on exertion    Past Surgical History:  Procedure Laterality Date  . COLONOSCOPY  10/06/06   internal hemorrhoids and anal papilla/poor prep  . ESOPHAGOGASTRODUODENOSCOPY N/A 05/01/2018   multiple 3-6 mm gastric nodules and gastritis, surrounding telangectasias. Biopsy with atrophic gastritis.   [facility] Kitchen FOOT SURGERY    . KNEE ARTHROSCOPY  06/17/2011   Procedure: ARTHROSCOPY KNEE;  Surgeon: Sanjuana Kava;  Location: [facility];  Service: Orthopedics;  Laterality: Right;   Social History:  reports that he has quit smoking. His smoking use included cigarettes. He has a 30.00 pack-year smoking history. He has never used smokeless  tobacco. He reports that he does not drink alcohol or use drugs.  Allergies  Allergen Reactions  . Hydrocodone-Acetaminophen     unknown    Family History  Problem Relation Age of Onset  . COPD Mother   . Cancer Brother   . Anesthesia problems Neg Hx   . Hypotension Neg Hx   . Malignant hyperthermia Neg Hx   . Pseudochol deficiency Neg Hx   . Colon cancer Neg Hx     Prior to Admission medications   Medication Sig Start Date End Date Taking? Authorizing Provider  acetaminophen (QC ARTHRITIS PAIN RELIEF) 650 MG CR tablet Take 650 mg by mouth every 8 (eight) hours as needed for pain.     [provider]  Ascorbic Acid 500 MG CHEW Chew 1 tablet by mouth daily.    [provider]  atenolol (TENORMIN) 25 MG tablet Take 1 tablet (25 mg total) by mouth daily. 05/03/18   Orson Eva, MD  atorvastatin (LIPITOR) 10 MG tablet Take 10 mg by mouth at bedtime     [provider]  cadexomer iodine (IODOSORB) 0.9 % gel Apply 1 application topically. Apply to left plantar foot topically every shift to promote wound healing    [provider]  carboxymethylcellulose (REFRESH TEARS) 0.5 % SOLN Apply 1 drop to eye 2 (two) times daily.     [provider]  cholecalciferol (VITAMIN D) 400 units  TABS tablet Take 400 Units by mouth daily.    [provider]  ferrous sulfate 325 (65 FE) MG EC tablet Take 325 mg by mouth 2 (two) times daily.    [provider]  furosemide (LASIX) 40 MG tablet Take 40 mg by mouth daily.     [provider]  ipratropium-albuterol (DUONEB) 0.5-2.5 (3) MG/3ML SOLN Take 3 mLs by nebulization every 8 (eight) hours as needed (for shortness of breath).    [provider]  ketoconazole (NIZORAL) 2 % shampoo Apply 1 application topically daily as needed for irritation (to scalp).    [provider]  magnesium hydroxide (MILK OF MAGNESIA) 400 MG/5ML suspension Take 30 mLs by mouth daily as needed for  mild constipation or moderate constipation. For constipation     [provider]  metFORMIN (GLUCOPHAGE) 500 MG tablet Take 500 mg by mouth at bedtime.    [provider]  mometasone (ELOCON) 0.1 % cream Apply 1 application topically daily as needed (for dry skin).    [provider]  nystatin (MYCOSTATIN/NYSTOP) powder Apply topically 2 (two) times daily. Apply to groin and penis topically every day and evening shift to promote skin healing    [provider]  nystatin-triamcinolone (MYCOLOG II) cream Apply 1 application topically daily as needed (for rash).    [provider]  potassium chloride SA (K-DUR,KLOR-CON) 20 MEQ tablet Take 20 mEq by mouth 2 (two) times daily.     [provider]  vitamin B-12 (CYANOCOBALAMIN) 1000 MCG tablet Take 1,000 mcg by mouth daily.    [provider]   Physical Exam: There were no vitals filed for this visit.   General exam: Moderately built and nourished patient, lying comfortably supine on the gurney in no obvious distress.  Head, eyes and ENT: Nontraumatic and normocephalic. Pupils equally reacting to light and accommodation. Oral mucosa moist.  Neck: Supple. No JVD, carotid bruit or thyromegaly.  Lymphatics: No lymphadenopathy.  Respiratory system: Clear to auscultation. No increased work of breathing.  Cardiovascular system: S1 and S2 heard, RRR. No JVD, murmurs, gallops, clicks or pedal edema.  Gastrointestinal system: Abdomen is nondistended, soft and nontender. Normal bowel sounds heard. No organomegaly or masses appreciated.  Central nervous system: Alert and oriented. No focal neurological deficits.  Extremities: Symmetric 5 x 5 power. Peripheral pulses symmetrically felt.   Skin: No rashes or acute findings.  Musculoskeletal system: braced left leg.  1+ edema BLEs.  Psychiatry: Pleasant and cooperative.  Labs on Admission:  Basic Metabolic Panel: No results for  input(s): NA, K, CL, CO2, GLUCOSE, BUN, CREATININE, CALCIUM, MG, PHOS in the last 168 hours. Liver Function Tests: No results for input(s): AST, ALT, ALKPHOS, BILITOT, PROT, ALBUMIN in the last 168 hours. No results for input(s): LIPASE, AMYLASE in the last 168 hours. No results for input(s): AMMONIA in the last 168 hours. CBC: No results for input(s): WBC, NEUTROABS, HGB, HCT, MCV, PLT in the last 168 hours. Cardiac Enzymes: No results for input(s): CKTOTAL, CKMB, CKMBINDEX, TROPONINI in the last 168 hours.  BNP (last 3 results) No results for input(s): PROBNP in the last 8760 hours. CBG: No results for input(s): GLUCAP in the last 168 hours.  Radiological Exams on Admission: No results found.  Assessment/Plan Active Problems:   GIB (gastrointestinal bleeding)   History of Bleeding ulcer   Hypertension   Heme positive stool   Iron deficiency anemia due to chronic blood loss   atrophic Gastritis   Dementia  SOB (shortness of breath) on exertion   Abdominal pain  1. Iron deficiency anemia-likely secondary to chronic blood loss.  Check CBC.  Patient is being followed and treated by hematology and is on iron supplementation.  He is being bowel prepped for colonoscopy. 2. History of gastric polyps and gastritis - He is being closely followed by GI.  3. Dementia - follow, encouraged family to stay with him as much as possible.  4. Essential hypertension - resume home meds and follow.   DVT Prophylaxis: subcut hep, SCD Code Status: full   Family Communication: updated at bedside  Disposition Plan: obs   Time spent: 73 minutes   Clanford Wynetta Emery, MD [facility] How to contact the [facility] Attending or Consulting provider [place] or covering provider during after hours [place], for this patient?  1. Check the care team in [place] and look for a) attending/consulting [facility] provider listed and b) the [facility] team listed 2. Log into [email or web address] and use [place]'s universal password to  access. If you do not have the password, please contact the hospital operator. 3. Locate the [facility] provider you are looking for under [facility] and page to a number that you can be directly reached. 4. If you still have difficulty reaching the provider, please page the [facility] (Director on Call) for the Hospitalists listed on amion for assistance.

## 2019-05-08 NOTE — Consult Note (Signed)
Primary Care Physician:  Caprice Renshaw, MD Primary Gastroenterologist:  Dr. Gala Romney   Date of Admission: 05/08/19 Date of Consultation: 05/08/19  Reason for Consultation:  Inpatient observation for bowel prep   HPI:  [name] is a 73 y.o. year old male who resides in a nursing home, presenting today for inpatient admission to help facilitate bowel prep. Heme positive last year. Known IDA. Inpatient last year with profound anemia without overt GI bleeding. Received 2 units PRBCs at that time. EGD Aug 2019 during inpatient admission with multiple 3-6 mm gastric nodules and gastritis, surrounding telangectasias. Biopsy with atrophic gastritis. Colonoscopy remote past (2008) with poor prep. Followed by Hematology and has been receiving IV iron. Ferritin 20 in March 2020 and Hgb 14. Evaluated as outpatient in June 2020 with colonoscopy now arranged for diagnostic purposes.  Rapid COVID testing: negative.  He was to start clear liquids 2 days prior to procedure and Linzess 290 daily X 3 days prior to procedure. Recommending holding metformin evening prior to procedure. Office staff called [name] yesterday and confirmed he had started Linzess. I am unable to reach staff today at [facility].   Several BMs this morning, large amount. Patient unable to give appropriate history. Does not know the city or year. Denies abdominal pain, N/V. No rectal bleeding. Bed bound.     Past Medical History:  Diagnosis Date  . Bell's palsy   . Bleeding ulcer    remote past  . Dementia    disoriented to time.does not know medical hx.information obtained from previous charts  . Hyperlipemia   . Hypertension   . Obesity   . SOB (shortness of breath) on exertion     Past Surgical History:  Procedure Laterality Date  . COLONOSCOPY  10/06/06   internal hemorrhoids and anal papilla/poor prep  . ESOPHAGOGASTRODUODENOSCOPY N/A 05/01/2018   multiple 3-6 mm gastric nodules and gastritis, surrounding telangectasias.  Biopsy with atrophic gastritis.   Marland Kitchen FOOT SURGERY    . KNEE ARTHROSCOPY  06/17/2011   Procedure: ARTHROSCOPY KNEE;  Surgeon: Sanjuana Kava;  Location: [facility];  Service: Orthopedics;  Laterality: Right;    Prior to Admission medications   Medication Sig Start Date End Date Taking? Authorizing Provider  acetaminophen (QC ARTHRITIS PAIN RELIEF) 650 MG CR tablet Take 650 mg by mouth every 8 (eight) hours as needed for pain.     [provider]  Ascorbic Acid 500 MG CHEW Chew 1 tablet by mouth daily.    [provider]  atenolol (TENORMIN) 25 MG tablet Take 1 tablet (25 mg total) by mouth daily. 05/03/18   Orson Eva, MD  atorvastatin (LIPITOR) 10 MG tablet Take 10 mg by mouth at bedtime.     [provider]  cadexomer iodine (IODOSORB) 0.9 % gel Apply 1 application topically. Apply to left plantar foot topically every shift to promote wound healing    [provider]  carboxymethylcellulose (REFRESH TEARS) 0.5 % SOLN Apply 1 drop to eye 2 (two) times daily.     [provider]  cholecalciferol (VITAMIN D) 400 units TABS tablet Take 400 Units by mouth daily.    [provider]  ferrous sulfate 325 (65 FE) MG EC tablet Take 325 mg by mouth 2 (two) times daily.    [provider]  furosemide (LASIX) 40 MG tablet Take 40 mg by mouth daily.     [provider]  ipratropium-albuterol (DUONEB) 0.5-2.5 (3) MG/3ML SOLN Take 3 mLs by nebulization  every 8 (eight) hours as needed (for shortness of breath).    [provider]  ketoconazole (NIZORAL) 2 % shampoo Apply 1 application topically daily as needed for irritation (to scalp).    [provider]  magnesium hydroxide (MILK OF MAGNESIA) 400 MG/5ML suspension Take 30 mLs by mouth daily as needed for mild constipation or moderate constipation. For constipation     [provider]  metFORMIN (GLUCOPHAGE) 500 MG tablet Take 500 mg by mouth at bedtime.    [provider]  mometasone (ELOCON) 0.1 % cream Apply 1 application topically daily as needed (for dry skin).    [provider]  nystatin (MYCOSTATIN/NYSTOP) powder Apply topically 2 (two) times daily. Apply to groin and penis topically every day and evening shift to promote skin healing    [provider]  nystatin-triamcinolone (MYCOLOG II) cream Apply 1 application topically daily as needed (for rash).    [provider]  potassium chloride SA (K-DUR,KLOR-CON) 20 MEQ tablet Take 20 mEq by mouth 2 (two) times daily.     [provider]  vitamin B-12 (CYANOCOBALAMIN) 1000 MCG tablet Take 1,000 mcg by mouth daily.    [provider]    Current Facility-Administered Medications  Medication Dose Route Frequency Provider Last Rate Last Dose  . 0.9 %  sodium chloride infusion  250 mL Intravenous PRN Johnson, Clanford L, MD      . acetaminophen (TYLENOL) tablet 650 mg  650 mg Oral Q6H PRN Johnson, Clanford L, MD       Or  . acetaminophen (TYLENOL) suppository 650 mg  650 mg Rectal Q6H PRN Johnson, Clanford L, MD      . atenolol (TENORMIN) tablet 25 mg  25 mg Oral Daily Johnson, Clanford L, MD   25 mg at 05/08/19 1052  . atorvastatin (LIPITOR) tablet 10 mg  10 mg Oral QHS Johnson, Clanford L, MD      . Derrill Memo ON 05/09/2019] ferrous sulfate tablet 325 mg  325 mg Oral Q breakfast Johnson, Clanford L, MD      . heparin injection 5,000 Units  5,000 Units Subcutaneous Q8H Johnson, Clanford L, MD      . insulin aspart (novoLOG) injection 0-9 Units  0-9 Units Subcutaneous TID WC Johnson, Clanford L, MD      . ipratropium-albuterol (DUONEB) 0.5-2.5 (3) MG/3ML nebulizer solution 3 mL  3 mL Nebulization Q8H PRN Johnson, Clanford L, MD      . ondansetron (ZOFRAN) tablet 4 mg  4 mg Oral Q6H PRN Johnson, Clanford L, MD       Or  . ondansetron (ZOFRAN) injection 4 mg  4 mg Intravenous Q6H PRN Johnson, Clanford L, MD      . polyvinyl alcohol (LIQUIFILM TEARS) 1.4 %  ophthalmic solution 1 drop  1 drop Both Eyes BID Johnson, Clanford L, MD   1 drop at 05/08/19 1052  . sodium chloride flush (NS) 0.9 % injection 3 mL  3 mL Intravenous Q12H Johnson, Clanford L, MD   3 mL at 05/08/19 1053  . sodium chloride flush (NS) 0.9 % injection 3 mL  3 mL Intravenous PRN Johnson, Clanford L, MD      . traZODone (DESYREL) tablet 25 mg  25 mg Oral QHS PRN Johnson, Clanford L, MD      . vitamin B-12 (CYANOCOBALAMIN) tablet 1,000 mcg  1,000 mcg Oral Daily Johnson, Clanford L, MD   1,000 mcg at 05/08/19 1052    Allergies as of 05/07/2019 -  Review Complete 03/20/2019  Allergen Reaction Noted  . Hydrocodone-acetaminophen  06/17/2011    Family History  Problem Relation Age of Onset  . COPD Mother   . Cancer Brother   . Anesthesia problems Neg Hx   . Hypotension Neg Hx   . Malignant hyperthermia Neg Hx   . Pseudochol deficiency Neg Hx   . Colon cancer Neg Hx     Social History   Socioeconomic History  . Marital status: Single    Spouse name: Not on file  . Number of children: Not on file  . Years of education: Not on file  . Highest education level: Not on file  Occupational History  . Not on file  Social Needs  . Financial resource strain: Not on file  . Food insecurity    Worry: Not on file    Inability: Not on file  . Transportation needs    Medical: Not on file    Non-medical: Not on file  Tobacco Use  . Smoking status: Former Smoker    Packs/day: 1.00    Years: 30.00    Pack years: 30.00    Types: Cigarettes  . Smokeless tobacco: Never Used  . Tobacco comment: quit a "long time ago"   Substance and Sexual Activity  . Alcohol use: No  . Drug use: No  . Sexual activity: Never  Lifestyle  . Physical activity    Days per week: Not on file    Minutes per session: Not on file  . Stress: Not on file  Relationships  . Social Herbalist on phone: Not on file    Gets together: Not on file    Attends religious service: Not on file     Active member of club or organization: Not on file    Attends meetings of clubs or organizations: Not on file    Relationship status: Not on file  . Intimate partner violence    Fear of current or ex partner: Not on file    Emotionally abused: Not on file    Physically abused: Not on file    Forced sexual activity: Not on file  Other Topics Concern  . Not on file  Social History Narrative  . Not on file    Review of Systems: Unable to obtain due to cognitive deficits.   Physical Exam: Vital signs in last 24 hours:     General:   Alert,  Well-developed, well-nourished, pleasant and cooperative in NAD. Pleasantly confused.  Head:  Normocephalic and atraumatic. Lungs:  Clear throughout to auscultation.    Heart:  S1 S2 present without murmurs Abdomen:  Obese, Soft, nontender and nondistended. +BS. Unable to appreciate HSM due to large AP diameter Rectal:  Deferred until time of colonoscopy.   Msk:  Bed bound Extremities:  Without  edema. Neurologic:  Alert and  Oriented to person only.  Psych:  Alert and cooperative. Normal mood and affect.  Intake/Output from previous day: No intake/output data recorded. Intake/Output this shift: Total I/O In: 360 [P.O.:360] Out: -   Lab Results  Component Value Date   WBC 5.8 05/08/2019   HGB 12.2 (L) 05/08/2019   HCT 39.0 05/08/2019   MCV 95.1 05/08/2019   PLT 168 05/08/2019   Creatinine: 0.62   Impression: 73 year old male with history of dementia and multiple other comorbidities, presenting today for inpatient observation to complete bowel prep for colonoscopy due to IDA and heme positive stool. EGD on file from last year.  Due to history of poor prep in remote past, bed bound, debilitated, it was felt best to have him present for overnight stay prior to colonoscopy to facilitate adequate preparation.   Clear liquids today. He is already having multiple BMs and had been prescribed Linzess 290 mcg leading up to the procedure. Will  discontinue heparin after last dose this afternoon. Prep starting today. Plans for discharge tomorrow after colonoscopy back to [name].   COVID-19 NEGATIVE.   Plan: [place]oy[place]  today Clear liquids NPO after midnight except sips with meds Heparin discontinued this afternoon  Tap water enemas in the morning Will need consent from [place], Marinda Elk, legal guardian. [phone], extension [phone]. Nursing staff aware Colonoscopy with conscious sedation by Dr. Gala Romney on 05/09/2019   Annitta Needs, PhD, ANP-BC [facility]     LOS: 0 days    05/08/2019, 12:09 PM

## 2019-05-09 ENCOUNTER — Inpatient Hospital Stay (HOSPITAL_COMMUNITY): Payer: Medicare Other

## 2019-05-09 ENCOUNTER — Encounter (HOSPITAL_COMMUNITY): Payer: Self-pay

## 2019-05-09 ENCOUNTER — Encounter (HOSPITAL_COMMUNITY)
Admission: RE | Disposition: A | Discharge status: Skilled Nursing Facility | Payer: Self-pay | Source: Home / Self Care | Attending: Internal Medicine

## 2019-05-09 DIAGNOSIS — Z20828 Contact with and (suspected) exposure to other viral communicable diseases: Secondary | ICD-10-CM | POA: Diagnosis present

## 2019-05-09 DIAGNOSIS — Z5331 Laparoscopic surgical procedure converted to open procedure: Secondary | ICD-10-CM | POA: Diagnosis not present

## 2019-05-09 DIAGNOSIS — R0602 Shortness of breath: Secondary | ICD-10-CM | POA: Diagnosis not present

## 2019-05-09 DIAGNOSIS — I10 Essential (primary) hypertension: Secondary | ICD-10-CM

## 2019-05-09 DIAGNOSIS — Z7984 Long term (current) use of oral hypoglycemic drugs: Secondary | ICD-10-CM | POA: Diagnosis not present

## 2019-05-09 DIAGNOSIS — Z885 Allergy status to narcotic agent status: Secondary | ICD-10-CM | POA: Diagnosis not present

## 2019-05-09 DIAGNOSIS — R293 Abnormal posture: Secondary | ICD-10-CM | POA: Diagnosis present

## 2019-05-09 DIAGNOSIS — L89151 Pressure ulcer of sacral region, stage 1: Secondary | ICD-10-CM | POA: Diagnosis present

## 2019-05-09 DIAGNOSIS — D649 Anemia, unspecified: Secondary | ICD-10-CM | POA: Diagnosis not present

## 2019-05-09 DIAGNOSIS — K6389 Other specified diseases of intestine: Secondary | ICD-10-CM | POA: Diagnosis not present

## 2019-05-09 DIAGNOSIS — Z79899 Other long term (current) drug therapy: Secondary | ICD-10-CM | POA: Diagnosis not present

## 2019-05-09 DIAGNOSIS — E11621 Type 2 diabetes mellitus with foot ulcer: Secondary | ICD-10-CM | POA: Diagnosis present

## 2019-05-09 DIAGNOSIS — Z7401 Bed confinement status: Secondary | ICD-10-CM | POA: Diagnosis not present

## 2019-05-09 DIAGNOSIS — Z6841 Body Mass Index (BMI) 40.0 and over, adult: Secondary | ICD-10-CM | POA: Diagnosis not present

## 2019-05-09 DIAGNOSIS — K219 Gastro-esophageal reflux disease without esophagitis: Secondary | ICD-10-CM | POA: Diagnosis present

## 2019-05-09 DIAGNOSIS — Z993 Dependence on wheelchair: Secondary | ICD-10-CM | POA: Diagnosis not present

## 2019-05-09 DIAGNOSIS — D120 Benign neoplasm of cecum: Secondary | ICD-10-CM

## 2019-05-09 DIAGNOSIS — D500 Iron deficiency anemia secondary to blood loss (chronic): Secondary | ICD-10-CM | POA: Diagnosis present

## 2019-05-09 DIAGNOSIS — R404 Transient alteration of awareness: Secondary | ICD-10-CM | POA: Diagnosis not present

## 2019-05-09 DIAGNOSIS — Z8711 Personal history of peptic ulcer disease: Secondary | ICD-10-CM | POA: Diagnosis not present

## 2019-05-09 DIAGNOSIS — K635 Polyp of colon: Secondary | ICD-10-CM | POA: Diagnosis not present

## 2019-05-09 DIAGNOSIS — R0902 Hypoxemia: Secondary | ICD-10-CM | POA: Diagnosis not present

## 2019-05-09 DIAGNOSIS — R278 Other lack of coordination: Secondary | ICD-10-CM | POA: Diagnosis present

## 2019-05-09 DIAGNOSIS — R195 Other fecal abnormalities: Secondary | ICD-10-CM | POA: Diagnosis not present

## 2019-05-09 DIAGNOSIS — M5136 Other intervertebral disc degeneration, lumbar region: Secondary | ICD-10-CM | POA: Diagnosis present

## 2019-05-09 DIAGNOSIS — Q438 Other specified congenital malformations of intestine: Secondary | ICD-10-CM | POA: Diagnosis not present

## 2019-05-09 DIAGNOSIS — M1990 Unspecified osteoarthritis, unspecified site: Secondary | ICD-10-CM | POA: Diagnosis present

## 2019-05-09 DIAGNOSIS — K922 Gastrointestinal hemorrhage, unspecified: Secondary | ICD-10-CM | POA: Diagnosis not present

## 2019-05-09 DIAGNOSIS — E876 Hypokalemia: Secondary | ICD-10-CM | POA: Diagnosis not present

## 2019-05-09 DIAGNOSIS — E669 Obesity, unspecified: Secondary | ICD-10-CM | POA: Diagnosis present

## 2019-05-09 DIAGNOSIS — Z9181 History of falling: Secondary | ICD-10-CM | POA: Diagnosis not present

## 2019-05-09 DIAGNOSIS — D509 Iron deficiency anemia, unspecified: Secondary | ICD-10-CM | POA: Diagnosis present

## 2019-05-09 DIAGNOSIS — F79 Unspecified intellectual disabilities: Secondary | ICD-10-CM | POA: Diagnosis present

## 2019-05-09 DIAGNOSIS — R29898 Other symptoms and signs involving the musculoskeletal system: Secondary | ICD-10-CM | POA: Diagnosis not present

## 2019-05-09 DIAGNOSIS — E785 Hyperlipidemia, unspecified: Secondary | ICD-10-CM | POA: Diagnosis present

## 2019-05-09 DIAGNOSIS — D123 Benign neoplasm of transverse colon: Secondary | ICD-10-CM | POA: Diagnosis present

## 2019-05-09 DIAGNOSIS — N5089 Other specified disorders of the male genital organs: Secondary | ICD-10-CM | POA: Diagnosis present

## 2019-05-09 DIAGNOSIS — F0390 Unspecified dementia without behavioral disturbance: Secondary | ICD-10-CM | POA: Diagnosis present

## 2019-05-09 DIAGNOSIS — E119 Type 2 diabetes mellitus without complications: Secondary | ICD-10-CM | POA: Diagnosis present

## 2019-05-09 DIAGNOSIS — Z87891 Personal history of nicotine dependence: Secondary | ICD-10-CM | POA: Diagnosis not present

## 2019-05-09 DIAGNOSIS — R41841 Cognitive communication deficit: Secondary | ICD-10-CM | POA: Diagnosis present

## 2019-05-09 DIAGNOSIS — C180 Malignant neoplasm of cecum: Secondary | ICD-10-CM | POA: Diagnosis present

## 2019-05-09 DIAGNOSIS — H04129 Dry eye syndrome of unspecified lacrimal gland: Secondary | ICD-10-CM | POA: Diagnosis present

## 2019-05-09 DIAGNOSIS — M6281 Muscle weakness (generalized): Secondary | ICD-10-CM | POA: Diagnosis present

## 2019-05-09 DIAGNOSIS — D510 Vitamin B12 deficiency anemia due to intrinsic factor deficiency: Secondary | ICD-10-CM | POA: Diagnosis present

## 2019-05-09 DIAGNOSIS — K2940 Chronic atrophic gastritis without bleeding: Secondary | ICD-10-CM | POA: Diagnosis present

## 2019-05-09 DIAGNOSIS — I739 Peripheral vascular disease, unspecified: Secondary | ICD-10-CM | POA: Diagnosis present

## 2019-05-09 DIAGNOSIS — F70 Mild intellectual disabilities: Secondary | ICD-10-CM | POA: Diagnosis present

## 2019-05-09 HISTORY — PX: BIOPSY: SHX5522

## 2019-05-09 HISTORY — PX: POLYPECTOMY: SHX5525

## 2019-05-09 HISTORY — PX: COLONOSCOPY: SHX5424

## 2019-05-09 LAB — CBC
HCT: 37.1 % — ABNORMAL LOW (ref 39.0–52.0)
Hemoglobin: 11.8 g/dL — ABNORMAL LOW (ref 13.0–17.0)
MCH: 29.5 pg (ref 26.0–34.0)
MCHC: 31.8 g/dL (ref 30.0–36.0)
MCV: 92.8 fL (ref 80.0–100.0)
Platelets: 158 K/uL (ref 150–400)
RBC: 4 MIL/uL — ABNORMAL LOW (ref 4.22–5.81)
RDW: 12.7 % (ref 11.5–15.5)
WBC: 4.7 K/uL (ref 4.0–10.5)
nRBC: 0 % (ref 0.0–0.2)

## 2019-05-09 LAB — BASIC METABOLIC PANEL
Anion gap: 9 (ref 5–15)
BUN: 12 mg/dL (ref 8–23)
CO2: 26 mmol/L (ref 22–32)
Calcium: 8.6 mg/dL — ABNORMAL LOW (ref 8.9–10.3)
Chloride: 106 mmol/L (ref 98–111)
Creatinine, Ser: 0.6 mg/dL — ABNORMAL LOW (ref 0.61–1.24)
GFR calc Af Amer: >60 mL/min (ref >60)
GFR calc non Af Amer: >60 mL/min (ref >60)
Glucose, Bld: 109 mg/dL — ABNORMAL HIGH (ref 70–99)
Potassium: 3.4 mmol/L — ABNORMAL LOW (ref 3.5–5.1)
Sodium: 141 mmol/L (ref 135–145)

## 2019-05-09 LAB — GLUCOSE, CAPILLARY
Glucose-Capillary: 106 mg/dL — ABNORMAL HIGH (ref 70–99)
Glucose-Capillary: 116 mg/dL — ABNORMAL HIGH (ref 70–99)
Glucose-Capillary: 99 mg/dL (ref 70–99)
Glucose-Capillary: 164 mg/dL — ABNORMAL HIGH (ref 70–99)
Glucose-Capillary: 154 mg/dL — ABNORMAL HIGH (ref 70–99)

## 2019-05-09 LAB — ECHOCARDIOGRAM COMPLETE
Height: 67 in
Weight: 4165.81 oz

## 2019-05-09 LAB — MAGNESIUM: Magnesium: 1.9 mg/dL (ref 1.7–2.4)

## 2019-05-09 SURGERY — COLONOSCOPY
Anesthesia: Moderate Sedation

## 2019-05-09 MED ORDER — ONDANSETRON HCL 4 MG/2ML IJ SOLN
INTRAMUSCULAR | Status: AC
  Filled 2019-05-09: qty 2

## 2019-05-09 MED ORDER — MEPERIDINE HCL 100 MG/ML IJ SOLN
INTRAMUSCULAR | Status: DC | PRN
  Administered 2019-05-09: 25 mg

## 2019-05-09 MED ORDER — IOHEXOL 300 MG/ML  SOLN
30.0000 mL | Freq: Once | INTRAMUSCULAR | Status: AC | PRN
  Administered 2019-05-09: 18:00:00 30 mL via ORAL

## 2019-05-09 MED ORDER — MIDAZOLAM HCL 5 MG/5ML IJ SOLN
INTRAMUSCULAR | Status: AC
  Filled 2019-05-09: qty 5

## 2019-05-09 MED ORDER — SODIUM CHLORIDE 0.9% FLUSH
INTRAVENOUS | Status: AC
  Filled 2019-05-09: qty 10

## 2019-05-09 MED ORDER — MEPERIDINE HCL 50 MG/ML IJ SOLN
INTRAMUSCULAR | Status: AC
  Filled 2019-05-09: qty 1

## 2019-05-09 MED ORDER — IOHEXOL 300 MG/ML  SOLN
100.0000 mL | Freq: Once | INTRAMUSCULAR | Status: AC | PRN
  Administered 2019-05-09: 100 mL via INTRAVENOUS

## 2019-05-09 MED ORDER — MIDAZOLAM HCL 5 MG/5ML IJ SOLN
INTRAMUSCULAR | Status: DC | PRN
  Administered 2019-05-09: 2 mg via INTRAVENOUS
  Administered 2019-05-09: 1 mg via INTRAVENOUS

## 2019-05-09 MED ORDER — SODIUM CHLORIDE 0.9 % IV SOLN
INTRAVENOUS | Status: DC
  Administered 2019-05-09: 1000 mL via INTRAVENOUS

## 2019-05-09 MED ORDER — ONDANSETRON HCL 4 MG/2ML IJ SOLN
INTRAMUSCULAR | Status: DC | PRN
  Administered 2019-05-09: 4 mg via INTRAVENOUS

## 2019-05-09 MED ORDER — POTASSIUM CHLORIDE CRYS ER 20 MEQ PO TBCR
20.0000 meq | EXTENDED_RELEASE_TABLET | Freq: Once | ORAL | Status: AC
  Administered 2019-05-09: 15:00:00 20 meq via ORAL
  Filled 2019-05-09: qty 1

## 2019-05-09 NOTE — Interval H&P Note (Signed)
History and Physical Interval Note:  05/09/2019 9:37 AM  [name]  has presented today for surgery, with the diagnosis of IDA, heme positive stool.  The various methods of treatment have been discussed with the patient and family. After consideration of risks, benefits and other options for treatment, the patient has consented to  Procedure(s): COLONOSCOPY (N/A) as a surgical intervention.  The patient's history has been reviewed, patient examined, no change in status, stable for surgery.  I have reviewed the patient's chart and labs.  Questions were answered to the patient's satisfaction.     [name]   No change.  Hemoglobin 11 range this morning.  Proceeding with a diagnostic colonoscopy per plan.  The risks, benefits, limitations, alternatives and imponderables have been reviewed with the patient. Questions have been answered. All parties are agreeable.

## 2019-05-09 NOTE — Progress Notes (Signed)
Completed first bottle of contrast and began second at 80.

## 2019-05-09 NOTE — TOC Initial Note (Addendum)
Transition of Care [facility]) - Initial/Assessment Note    Patient Details  Name: [name] MRN: [id number] Date of Birth: [birth year]  Transition of Care [facility]) CM/SW Contact:    Boneta Lucks, RN Phone Number: 05/09/2019, 2:21 PM  Clinical Narrative:     Pt is an award of the state.  His guardian is Marinda Elk [phone]. She want [place] to fax surgeon notes and lab to [phone].  They will need to review to give [place] consent for surgery.  CM waiting on surgeon consult.              Addendum : secure chat sent to Dr Constance Haw with this info. To get consent.  Expected Discharge Plan: Skilled Nursing Facility Barriers to Discharge: Other (comment)(Pending surgery)   Patient Goals and CMS Choice Patient states their goals for this hospitalization and ongoing recovery are:: to go back to SNF      Expected Discharge Plan and Services Expected Discharge Plan: [place] In-house Referral: Clinical Social Work                                            Prior Living Arrangements/Services   Lives with:: Facility Resident   Do you feel safe going back to the place where you live?: Yes      Need for Family Participation in Patient Care: Yes (Comment) Care giver support system in place?: Yes (comment)   Criminal Activity/Legal Involvement Pertinent to Current Situation/Hospitalization: No - Comment as needed  Activities of Daily Living Home Assistive Devices/Equipment: [employer] fast streamer ADL Screening (condition at time of admission) Patient's cognitive ability adequate to safely complete daily activities?: No Is the patient deaf or have difficulty hearing?: Yes Does the patient have difficulty seeing, even when wearing glasses/contacts?: Yes Does the patient have difficulty concentrating, remembering, or making decisions?: Yes Patient able to express need for assistance with ADLs?: No Does the patient have difficulty dressing or bathing?: Yes Independently  performs ADLs?: No Communication: Independent Dressing (OT): Needs assistance Is this a change from baseline?: Pre-admission baseline Grooming: Needs assistance Is this a change from baseline?: Pre-admission baseline Feeding: Independent Bathing: Needs assistance Is this a change from baseline?: Pre-admission baseline Toileting: Needs assistance Is this a change from baseline?: Pre-admission baseline In/Out Bed: Needs assistance Is this a change from baseline?: Pre-admission baseline Walks in Home: Dependent Is this a change from baseline?: Pre-admission baseline Does the patient have difficulty walking or climbing stairs?: Yes Weakness of Legs: Both Weakness of Arms/Hands: Both  Permission Sought/Granted Permission sought to share information with : Case Manager Permission granted to share information with : Yes, Verbal Permission Granted        Permission granted to share info w Relationship: Marinda Elk - Guardian [phone]        Admission diagnosis:  Colonoscopy prep Patient Active Problem List   Diagnosis Date Noted  . Colonic mass 05/09/2019  . 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 Abdominal pain 05/08/2019  . Dementia   . SOB (shortness of breath) on exertion   . Hypotension   . Acute blood loss anemia 05/01/2018  . Heme positive stool 05/01/2018  . Guaiac positive stools   . Iron deficiency anemia due to chronic blood loss   . GIB (gastrointestinal bleeding) 04/30/2018  . History of Bleeding ulcer   . Hypertension   . Encounter for screening colonoscopy 10/08/2011  .  CLOSED DISLOCATION OF FOOT UNSPECIFIED PART 01/15/2010   PCP:  Caprice Renshaw, MD Pharmacy:   [facility], [place] [address] [place] [address] Phone: [phone] Fax: [phone]         Readmission Risk Interventions No flowsheet data found.

## 2019-05-09 NOTE — Progress Notes (Signed)
PROGRESS NOTE  [name] FAO:[id number] DOB: [birth year] DOA: 05/08/2019 PCP: Caprice Renshaw, MD  Brief History:  73 y.o. year old male who resides in a nursing home ([facility]), presenting today for inpatient admission to help facilitate bowel prep. Pt was fount to beHeme positive last year with known IDA.  He was admitted in Aug 2019 with profound anemia without overt GI bleeding. Received 2 units PRBCs at that time. EGD Aug 2019 during inpatient admission with multiple 3-6 mm gastric nodules and gastritis, surrounding telangectasias.  Biopsy showed atrophic gastritis.  He is followed by hematology and has been receiving IV iron.  He was seen in GI office in June 2020 and they have arranged for patient to have colonoscopy on 05/09/19.  Assessment/Plan: Cecal Mass -05/09/19 colonoscopy--fungating cecal mass -general surgery consult   Iron Deficiency Anemia -pt had been receiving IV iron at [facility]--two infusions weekly -12/06/18 ferritin = 20  Hypertension -continue atenolol  Hyperlipidemia -continue atorvastatin  Diabetes Mellitus type 2 -holding metformin -05/08/19 A1C--5.5  Hypokalemia -replete -mag 1.9  Stage 1 sacral pressure injury -local wound care     Disposition Plan:   Home when stable after surgery Family Communication:   Updated pt's Education officer, museum at [employer]  Consultants:  GI, general surgery  Code Status:  FULL   DVT Prophylaxis:  [place] Heparin   Procedures: As Listed in Progress Note Above  Antibiotics: None       Subjective: Patient denies fevers, chills, headache, chest pain, dyspnea, nausea, vomiting, diarrhea, abdominal pain, dysuria, hematuria, hematochezia, and melena.   Objective: Vitals:   05/09/19 1000 05/09/19 1005 05/09/19 1010 05/09/19 1015  BP: 121/71 136/90 118/69   Pulse: (!) 38 78 78 77  Resp: 14 19 17 20   Temp:      TempSrc:      SpO2: 100% 100% 100% 100%  Weight:      Height:        Intake/Output Summary  (Last 24 hours) at 05/09/2019 1232 Last data filed at 05/08/2019 1700 Gross per 24 hour  Intake 600 ml  Output 300 ml  Net 300 ml   Weight change:  Exam:   General:  Pt is alert, follows commands appropriately, not in acute distress  HEENT: No icterus, No thrush, No neck mass, [place]/AT  Cardiovascular: RRR, S1/S2, no rubs, no gallops  Respiratory: CTA bilaterally, no wheezing, no crackles, no rhonchi  Abdomen: Soft/+BS, non tender, non distended, no guarding  Extremities: trace LE edema, No lymphangitis, No petechiae, No rashes, no synovitis   Data Reviewed: I have personally reviewed following labs and imaging studies Basic Metabolic Panel: Recent Labs  Lab 05/08/19 0928 05/09/19 0501  NA 143 141  K 4.0 3.4*  CL 107 106  CO2 22 26  GLUCOSE 117* 109*  BUN 14 12  CREATININE 0.70  0.62 0.60*  CALCIUM 8.6* 8.6*  MG  --  1.9   Liver Function Tests: No results for input(s): AST, ALT, ALKPHOS, BILITOT, PROT, ALBUMIN in the last 168 hours. No results for input(s): LIPASE, AMYLASE in the last 168 hours. No results for input(s): AMMONIA in the last 168 hours. Coagulation Profile: No results for input(s): INR, PROTIME in the last 168 hours. CBC: Recent Labs  Lab 05/08/19 0928 05/09/19 0501  WBC 5.8 4.7  HGB 12.2* 11.8*  HCT 39.0 37.1*  MCV 95.1 92.8  PLT 168 158   Cardiac Enzymes: No results for input(s): CKTOTAL,  CKMB, CKMBINDEX, TROPONINI in the last 168 hours. BNP: Invalid input(s): POCBNP CBG: Recent Labs  Lab 05/08/19 1620 05/08/19 2204 05/09/19 0746 05/09/19 0854 05/09/19 1108  GLUCAP 129* 97 106* 116* 99   HbA1C: Recent Labs    05/08/19 0928  HGBA1C 5.5   Urine analysis:    Component Value Date/Time   COLORURINE YELLOW 05/27/2010 2038   APPEARANCEUR CLEAR 05/27/2010 2038   LABSPEC 1.015 05/27/2010 2038   PHURINE 5.0 05/27/2010 2038   GLUCOSEU NEGATIVE 05/27/2010 2038   HGBUR TRACE (A) 05/27/2010 2038   BILIRUBINUR NEGATIVE 05/27/2010  2038   KETONESUR NEGATIVE 05/27/2010 2038   PROTEINUR NEGATIVE 05/27/2010 2038   UROBILINOGEN 0.2 05/27/2010 2038   NITRITE NEGATIVE 05/27/2010 2038   LEUKOCYTESUR NEGATIVE 05/27/2010 2038   Sepsis Labs: @LABRCNTIP (procalcitonin:4,lacticidven:4) ) Recent Results (from the past 240 hour(s))  SARS Coronavirus 2 [facility] order, Performed in [facility] hospital lab) Nasopharyngeal Nasopharyngeal Swab     Status: None   Collection Time: 05/08/19  8:13 AM   Specimen: Nasopharyngeal Swab  Result Value Ref Range Status   SARS Coronavirus 2 NEGATIVE NEGATIVE Final    Comment: (NOTE) If result is NEGATIVE SARS-CoV-2 target nucleic acids are NOT DETECTED. The SARS-CoV-2 RNA is generally detectable in upper and lower  respiratory specimens during the acute phase of infection. The lowest  concentration of SARS-CoV-2 viral copies this assay can detect is 250  copies / mL. A negative result does not preclude SARS-CoV-2 infection  and should not be used as the sole basis for treatment or other  patient management decisions.  A negative result may occur with  improper specimen collection / handling, submission of specimen other  than nasopharyngeal swab, presence of viral mutation(s) within the  areas targeted by this assay, and inadequate number of viral copies  (<250 copies / mL). A negative result must be combined with clinical  observations, patient history, and epidemiological information. If result is POSITIVE SARS-CoV-2 target nucleic acids are DETECTED. The SARS-CoV-2 RNA is generally detectable in upper and lower  respiratory specimens dur ing the acute phase of infection.  Positive  results are indicative of active infection with SARS-CoV-2.  Clinical  correlation with patient history and other diagnostic information is  necessary to determine patient infection status.  Positive results do  not rule out bacterial infection or co-infection with other viruses. If result is PRESUMPTIVE  POSTIVE SARS-CoV-2 nucleic acids MAY BE PRESENT.   A presumptive positive result was obtained on the submitted specimen  and confirmed on repeat testing.  While 2019 novel coronavirus  (SARS-CoV-2) nucleic acids may be present in the submitted sample  additional confirmatory testing may be necessary for epidemiological  and / or clinical management purposes  to differentiate between  SARS-CoV-2 and other Sarbecovirus currently known to infect humans.  If clinically indicated additional testing with an alternate test  methodology [phone]) is advised. The SARS-CoV-2 RNA is generally  detectable in upper and lower respiratory sp ecimens during the acute  phase of infection. The expected result is Negative. Fact Sheet for Patients:  StrictlyIdeas.no Fact Sheet for Healthcare Providers: BankingDealers.co.za This test is not yet approved or cleared by the [place] FDA and has been authorized for detection and/or diagnosis of SARS-CoV-2 by FDA under an Emergency Use Authorization (EUA).  This EUA will remain in effect (meaning this test can be used) for the duration of the COVID-19 declaration under Section 564(b)(1) of the Act, 21 U.S.C. section 360bbb-3(b)(1), unless the authorization is terminated  or revoked sooner. Performed at [facility], [address] [place], [place] [address]   MRSA PCR Screening     Status: None   Collection Time: 05/08/19  1:51 PM   Specimen: Nasopharyngeal  Result Value Ref Range Status   MRSA by PCR NEGATIVE NEGATIVE Final    Comment:        The GeneXpert MRSA Assay (FDA approved for NASAL specimens only), is one component of a comprehensive MRSA colonization surveillance program. It is not intended to diagnose MRSA infection nor to guide or monitor treatment for MRSA infections. Performed at [facility], [address], [place],  [address]      Scheduled Meds: . atenolol  25 mg Oral  Daily  . atorvastatin  10 mg Oral QHS  . furosemide  40 mg Oral Daily  . insulin aspart  0-9 Units Subcutaneous TID WC  . meperidine      . midazolam      . ondansetron      . polyvinyl alcohol  1 drop Both Eyes BID  . sodium chloride flush  3 mL Intravenous Q12H  . sodium chloride flush      . vitamin B-12  1,000 mcg Oral Daily   Continuous Infusions: . sodium chloride      Procedures/Studies: No results found.  Orson Eva, DO  [facility] Pager [phone]  If 7PM-7AM, please contact night-coverage [email or web address] Password [facility] 05/09/2019, 12:32 PM   LOS: 0 days

## 2019-05-09 NOTE — Op Note (Addendum)
[facility] Patient Name: [name] Procedure Date: 05/09/2019 9:04 AM MRN: [id number] Date of Birth: [birth year] Attending MD: Norvel Richards , MD CSN: [id number] Age: 73 Admit Type: Outpatient Procedure:                Colonoscopy Indications:              Iron deficiency anemia Providers:                Norvel Richards, MD, Janeece Riggers, RN, Aram Candela Referring MD:              Medicines:                Midazolam 3 mg IV, Meperidine 25 mg IV Complications:            No immediate complications. Estimated Blood Loss:     Estimated blood loss was minimal. Procedure:                Pre-Anesthesia Assessment:                           - Prior to the procedure, a History and Physical                            was performed, and patient medications and                            allergies were reviewed. The patient's tolerance of                            previous anesthesia was also reviewed. The risks                            and benefits of the procedure and the sedation                            options and risks were discussed with the patient.                            All questions were answered, and informed consent                            was obtained. Prior Anticoagulants: The patient has                            taken no previous anticoagulant or antiplatelet                            agents. ASA Grade Assessment: III - A patient with                            severe systemic disease. After reviewing the risks  and benefits, the patient was deemed in                            satisfactory condition to undergo the procedure.                           After obtaining informed consent, the colonoscope                            was passed under direct vision. Throughout the                            procedure, the patient's blood pressure, pulse, and                            oxygen saturations  were monitored continuously. The                            CF-HQ190L ([id number] scope was introduced through                            the anus and advanced to the the cecum, identified                            by the ileocecal valve. The ileocecal valve and the                            rectum were photographed. Scope In: 9:47:24 AM Scope Out: 10:16:38 AM Scope Withdrawal Time: 0 hours 11 minutes 9 seconds  Total Procedure Duration: 0 hours 29 minutes 14 seconds  Findings:      The perianal and digital rectal examinations were normal. Initially,       prep was poor in the rectum with formed and semi-formed stool flowing       out of the rectum. This was ultimately washed and suctioned repeatedly       to gain an adequate view. Long, redundant, tortuous colon. External       abdominal pressure required to reach the cecum. Fungating mass arising       out of the cecum, pretty much replacing the normal cecal mucosa. Please       see multiple photos. Biopsies taken. (2) 5 mm polyps at hepatic flexure       removed with cold snare. There were a couple of polyps just distal to       the cecal mass which were not manipulated. Remainder of the colonic       mucosa including rectum (on?face and retroflexed) revealed normal mucosa. Impression:               -Cecal tumor - status post biopsy. Colonic polyps                            as described above. Elongated, redundant colon. Moderate Sedation:      Moderate (conscious) sedation was administered by the endoscopy nurse       and supervised by the endoscopist. The following parameters were       monitored:  oxygen saturation, heart rate, blood pressure, respiratory       rate, EKG, adequacy of pulmonary ventilation, and response to care.       Total physician intraservice time was 33 minutes. Recommendation:           - Return patient to hospital ward for ongoing care.                           - Clear liquid diet.                            - Continue present medications. Follow-up on                            pathology. Surgical consultation. I have messaged                            Dr. Luan Pulling regarding my findings today. Procedure Code(s):        --- Professional ---                           [phone], Colonoscopy, flexible; diagnostic, including                            collection of specimen(s) by brushing or washing,                            when performed (separate procedure)                           99153, Moderate sedation; each additional 15                            minutes intraservice time                           [id number], Moderate sedation services provided by the                            same physician or other qualified health care                            professional performing a gastrointestinal                            endoscopic service that sedation supports,                            requiring the presence of an independent trained                            observer to assist in the monitoring of the                            patient's level of consciousness and physiological  status; initial 15 minutes of intra-service time;                            patient age 81 years or older (additional time may                            be reported with [phone], as appropriate) Diagnosis Code(s):        --- Professional ---                           D50.9, Iron deficiency anemia, unspecified CPT copyright 2019 [employer]. All rights reserved. The codes documented in this report are preliminary and upon coder review may  be revised to meet current compliance requirements. Cristopher Estimable. Abdel Effinger, MD Norvel Richards, MD 05/09/2019 10:44:41 AM This report has been signed electronically. Number of Addenda: 0

## 2019-05-09 NOTE — H&P (View-Only) (Signed)
Spoke with patient's nurse, Izora Gala. Patient has had two tap water enemas this morning, running clear. For colonoscopy later this morning.   Laureen Ochs. Bernarda Caffey [facility] [phone] 8/12/20207:46 AM

## 2019-05-09 NOTE — H&P (View-Only) (Signed)
I was present with the medical student for this service. I personally verified the history of present illness, performed the physical exam, and made the plan for this encounter. I have verified the medical student's documentation and made modifications where appropriately. I have personally documented in my own words a brief history, physical, and plan below.     73 yo only oriented to self and location, somewhat to situation. Who is a ward of the state. He says his mother, father and brother have died, and he has no other family.  He has lived at [name] for sometime. 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 Rourk today.  The patient is non ambulatory and he reports this is after having surgery on his foot. He says he has been bedbound/ or using a wheelchair for a long time.  He has never had abdominal surgery.  He is unsure of any cancers in his family.   This mass is cancer until proven otherwise.   I ordered workup for staging including CEA, CXR, CT a/p which demonstrates the cecal mass but no signs of metastatic disease. Patient's abdomen is large and lateral walls are lax but thickness of the abdominal wall is about average. Most of his abdominal fat is peritoneal in nature based on abundant mesentery.   Also ordered ECHO for preoperative evaluation.  Soft, nondistended obese abdomen  Frog leg in bed, almost clubbed feet   Will need to speak with Tierra Ross, guardian to discuss the need for surgery and plans moving forward.   Given is size and the laxity of the side walls, I worry about exposure. Will start laparoscopically to help with mobilization. Plan for laparoscopic partial colectomy Friday.  Will redo bowel prep with some doses of miralax as it is reported to not be great and keep on clears. Will make sure we have blood consent.  Multiple risk including bleeding, infection, anastomotic breakdown, recurrence, need for open surgery, injury to ureter,  and post operative complications like pneumonia will need to be discussed with Ms. Ross. Will call her tomorrow.  Lindsay Bridges, MD [facility] [address] [place], [place] [address] [phone] (office)  [facility] History and Physical  Reason for Referral: Cecal mass Referring Physician: GI - Dr Robert Rourk   Malikiah E Earll is a 73 y.o. male.  HPI: Jayjay E Murri is a 73 y.o. 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 Patient has had about a year of iron deficiency anemia where he gets IV iron from a hematologist. A year ago his hbg was 8.5, five months ago it was 14.1 and today it was 11.8 stable from 12.2 yesterday. Colonoscopy on 8/12 showed a cecal tumor status post biopsy, colonic polyps, and an elongated redundant colon.   On exam he was AxOX2 oriented to person and event but not time. He was pleasant and cooperative but was confused at times and could not follow all commands.  According to nurse he has had no nausea, vomiting, hematochezia, dark stools while he has been here. No fevers, chills or night sweats. No cough or chest pain.  Last year he had an EGD that showed 3-6 mm gastric nodules and gastritis. He has a history of PUD. He also has chronic SOB on exertion.   He was living at a skilled nursing facility and has a   legal guardian Tierra Ross because he has no family.   Past Medical History:  Diagnosis Date  . Bell's palsy   . Bleeding ulcer    remote past  . Dementia    disoriented to time.does not know medical hx.information obtained from previous charts  . Hyperlipemia   . Hypertension   . Obesity   . SOB (shortness of breath) on exertion     Past Surgical History:  Procedure Laterality Date  . COLONOSCOPY  10/06/06   internal hemorrhoids and anal papilla/poor prep  . ESOPHAGOGASTRODUODENOSCOPY N/A  05/01/2018   multiple 3-6 mm gastric nodules and gastritis, surrounding telangectasias. Biopsy with atrophic gastritis.   . FOOT SURGERY    . KNEE ARTHROSCOPY  06/17/2011   Procedure: ARTHROSCOPY KNEE;  Surgeon: Wayne Keeling;  Location: [facility];  Service: Orthopedics;  Laterality: Right;    Family History  Problem Relation Age of Onset  . COPD Mother   . Cancer Brother   . Anesthesia problems Neg Hx   . Hypotension Neg Hx   . Malignant hyperthermia Neg Hx   . Pseudochol deficiency Neg Hx   . Colon cancer Neg Hx     Social History   Tobacco Use  . Smoking status: Former Smoker    Packs/day: 1.00    Years: 30.00    Pack years: 30.00    Types: Cigarettes  . Smokeless tobacco: Never Used  . Tobacco comment: quit a "long time ago"   Substance Use Topics  . Alcohol use: No  . Drug use: No    Medications:  Medications in chart were reviewed.  Current Facility-Administered Medications  Medication Dose Route Frequency Provider Last Rate Last Dose  . 0.9 %  sodium chloride infusion  250 mL Intravenous PRN Rourk, Robert M, MD      . acetaminophen (TYLENOL) tablet 650 mg  650 mg Oral Q6H PRN Rourk, Robert M, MD       Or  . acetaminophen (TYLENOL) suppository 650 mg  650 mg Rectal Q6H PRN Rourk, Robert M, MD      . atenolol (TENORMIN) tablet 25 mg  25 mg Oral Daily Rourk, Robert M, MD   25 mg at 05/09/19 1252  . atorvastatin (LIPITOR) tablet 10 mg  10 mg Oral QHS Rourk, Robert M, MD   10 mg at 05/09/19 2024  . furosemide (LASIX) tablet 40 mg  40 mg Oral Daily Rourk, Robert M, MD   40 mg at 05/09/19 1250  . insulin aspart (novoLOG) injection 0-9 Units  0-9 Units Subcutaneous TID WC Rourk, Robert M, MD   1 Units at 05/08/19 1706  . ipratropium-albuterol (DUONEB) 0.5-2.5 (3) MG/3ML nebulizer solution 3 mL  3 mL Nebulization Q8H PRN Rourk, Robert M, MD      . ondansetron (ZOFRAN) tablet 4 mg  4 mg Oral Q6H PRN Rourk, Robert M, MD       Or  . ondansetron (ZOFRAN) injection 4 mg  4 mg  Intravenous Q6H PRN Rourk, Robert M, MD      . polyvinyl alcohol (LIQUIFILM TEARS) 1.4 % ophthalmic solution 1 drop  1 drop Both Eyes BID Rourk, Robert M, MD   1 drop at 05/09/19 2024  . sodium chloride flush (NS) 0.9 % injection 3 mL  3 mL Intravenous Q12H Rourk, Robert M, MD   3 mL at 05/09/19 2024  . sodium chloride flush (NS) 0.9 % injection 3 mL  3 mL Intravenous PRN Rourk, Robert M, MD      .   traZODone (DESYREL) tablet 25 mg  25 mg Oral QHS PRN Rourk, Robert M, MD      . vitamin B-12 (CYANOCOBALAMIN) tablet 1,000 mcg  1,000 mcg Oral Daily Rourk, Robert M, MD   1,000 mcg at 05/09/19 1250   Medications Prior to Admission  Medication Sig Dispense Refill  . atenolol (TENORMIN) 25 MG tablet Take 1 tablet (25 mg total) by mouth daily. 30 tablet 0  . atorvastatin (LIPITOR) 10 MG tablet Take 10 mg by mouth at bedtime.     . carboxymethylcellulose (REFRESH TEARS) 0.5 % SOLN Apply 1 drop to eye 2 (two) times daily.     . cholecalciferol (VITAMIN D) 400 units TABS tablet Take 400 Units by mouth daily.    . ferrous sulfate 325 (65 FE) MG EC tablet Take 325 mg by mouth 2 (two) times daily.    . furosemide (LASIX) 40 MG tablet Take 40 mg by mouth daily.     . ipratropium-albuterol (DUONEB) 0.5-2.5 (3) MG/3ML SOLN Take 3 mLs by nebulization every 8 (eight) hours as needed (for shortness of breath).    . ketoconazole (NIZORAL) 2 % cream Apply 1 application topically 2 (two) times daily as needed for irritation.    . magnesium hydroxide (MILK OF MAGNESIA) 400 MG/5ML suspension Take 30 mLs by mouth daily as needed for mild constipation or moderate constipation. For constipation     . metFORMIN (GLUCOPHAGE) 500 MG tablet Take 500 mg by mouth at bedtime.    . mometasone (ELOCON) 0.1 % cream Apply 1 application topically daily as needed (for dry skin).    . nystatin (MYCOSTATIN/NYSTOP) powder Apply topically 2 (two) times daily. Apply to groin and penis topically every day and evening shift to promote skin  healing    . nystatin-triamcinolone (MYCOLOG II) cream Apply 1 application topically daily as needed (for rash).    . potassium chloride SA (K-DUR,KLOR-CON) 20 MEQ tablet Take 20 mEq by mouth 2 (two) times daily.     . vitamin B-12 (CYANOCOBALAMIN) 1000 MCG tablet Take 1,000 mcg by mouth daily.     ROS:  A comprehensive review of systems was negative. But limited due to the patient's ability to answer all questions.  He complains of no abdominal pain, no nausea/vomiting +nonambulatory   Blood pressure 128/71, pulse 74, temperature 98.3 F (36.8 C), temperature source Oral, resp. rate 19, height 5' 7" (1.702 m), weight 118.1 kg, SpO2 100 %. Physical Exam  Constitutional: He is well-developed, well-nourished, and in no distress. Vital signs are normal. No distress.  HENT:  Head: Normocephalic and atraumatic.  Eyes: Pupils are equal, round, and reactive to light.  Neck: Normal range of motion. Neck supple.  Cardiovascular: Normal rate, regular rhythm and normal heart sounds.  Pulmonary/Chest: Effort normal and breath sounds normal.  Abdominal: Soft. Bowel sounds are normal. He exhibits no distension. There is no abdominal tenderness. There is no rebound and no guarding.  Musculoskeletal: Normal range of motion.     Comments: Patient is bed-bound. Normal muscle tone, no atrophy. Feet are swollen and large, not erythematous. No leg edema. Normal sensation on legs.   Neurological: He is alert.  Patient is A&OX2 oriented to person and place but not time. He does not understand all commands, cooperative.   Skin: Skin is warm and dry.  Psychiatric: Affect normal.  Vitals reviewed.   Results: Results for orders placed or performed during the hospital encounter of 05/08/19 (from the past 48 hour(s))  SARS Coronavirus 2 (Hospital   order, Performed in [facility] [place] hospital lab) Nasopharyngeal Nasopharyngeal Swab     Status: None   Collection Time: 05/08/19  8:13 AM   Specimen: Nasopharyngeal Swab   Result Value Ref Range   SARS Coronavirus 2 NEGATIVE NEGATIVE    Comment: (NOTE) If result is NEGATIVE SARS-CoV-2 target nucleic acids are NOT DETECTED. The SARS-CoV-2 RNA is generally detectable in upper and lower  respiratory specimens during the acute phase of infection. The lowest  concentration of SARS-CoV-2 viral copies this assay can detect is 250  copies / mL. A negative result does not preclude SARS-CoV-2 infection  and should not be used as the sole basis for treatment or other  patient management decisions.  A negative result may occur with  improper specimen collection / handling, submission of specimen other  than nasopharyngeal swab, presence of viral mutation(s) within the  areas targeted by this assay, and inadequate number of viral copies  (<250 copies / mL). A negative result must be combined with clinical  observations, patient history, and epidemiological information. If result is POSITIVE SARS-CoV-2 target nucleic acids are DETECTED. The SARS-CoV-2 RNA is generally detectable in upper and lower  respiratory specimens dur ing the acute phase of infection.  Positive  results are indicative of active infection with SARS-CoV-2.  Clinical  correlation with patient history and other diagnostic information is  necessary to determine patient infection status.  Positive results do  not rule out bacterial infection or co-infection with other viruses. If result is PRESUMPTIVE POSTIVE SARS-CoV-2 nucleic acids MAY BE PRESENT.   A presumptive positive result was obtained on the submitted specimen  and confirmed on repeat testing.  While 2019 novel coronavirus  (SARS-CoV-2) nucleic acids may be present in the submitted sample  additional confirmatory testing may be necessary for epidemiological  and / or clinical management purposes  to differentiate between  SARS-CoV-2 and other Sarbecovirus currently known to infect humans.  If clinically indicated additional testing with  an alternate test  methodology ([id number]) is advised. The SARS-CoV-2 RNA is generally  detectable in upper and lower respiratory sp ecimens during the acute  phase of infection. The expected result is Negative. Fact Sheet for Patients:  https://www.fda.gov/media/136312/download Fact Sheet for Healthcare Providers: https://www.fda.gov/media/136313/download This test is not yet approved or cleared by the [place] FDA and has been authorized for detection and/or diagnosis of SARS-CoV-2 by FDA under an Emergency Use Authorization (EUA).  This EUA will remain in effect (meaning this test can be used) for the duration of the COVID-19 declaration under Section 564(b)(1) of the Act, 21 U.S.C. section 360bbb-3(b)(1), unless the authorization is terminated or revoked sooner. Performed at [facility], [address] , [place] [address]   Hemoglobin A1c     Status: None   Collection Time: 05/08/19  9:28 AM  Result Value Ref Range   Hgb A1c MFr Bld 5.5 4.8 - 5.6 %    Comment: (NOTE) Pre diabetes:          5.7%-6.4% Diabetes:              >6.4% Glycemic control for   <7.0% adults with diabetes    Mean Plasma Glucose 111.15 mg/dL    Comment: Performed at [facility], [address] [place], [place] [address]  CBC     Status: Abnormal   Collection Time: 05/08/19  9:28 AM  Result Value Ref Range   WBC 5.8 4.0 - 10.5 K/uL   RBC 4.10 (L) 4.22 -   5.81 MIL/uL   Hemoglobin 12.2 (L) 13.0 - 17.0 g/dL   HCT 39.0 39.0 - 52.0 %   MCV 95.1 80.0 - 100.0 fL   MCH 29.8 26.0 - 34.0 pg   MCHC 31.3 30.0 - 36.0 g/dL   RDW 12.6 11.5 - 15.5 %   Platelets 168 150 - 400 K/uL   nRBC 0.0 0.0 - 0.2 %    Comment: Performed at [facility], [address] [place], [place] [address]  Creatinine, serum     Status: None   Collection Time: 05/08/19  9:28 AM  Result Value Ref Range   Creatinine, Ser 0.62 0.61 - 1.24 mg/dL   GFR calc non Af Amer >60 >60 mL/min   GFR calc Af Amer >60 >60 mL/min     Comment: Performed at [facility], [address] [place], [place] [address]  Basic metabolic panel     Status: Abnormal   Collection Time: 05/08/19  9:28 AM  Result Value Ref Range   Sodium 143 135 - 145 mmol/L   Potassium 4.0 3.5 - 5.1 mmol/L   Chloride 107 98 - 111 mmol/L   CO2 22 22 - 32 mmol/L   Glucose, Bld 117 (H) 70 - 99 mg/dL   BUN 14 8 - 23 mg/dL   Creatinine, Ser 0.70 0.61 - 1.24 mg/dL   Calcium 8.6 (L) 8.9 - 10.3 mg/dL   GFR calc non Af Amer >60 >60 mL/min   GFR calc Af Amer >60 >60 mL/min   Anion gap 14 5 - 15    Comment: Performed at [facility], [address] [place], [place] [address]  Glucose, capillary     Status: Abnormal   Collection Time: 05/08/19 11:15 AM  Result Value Ref Range   Glucose-Capillary 137 (H) 70 - 99 mg/dL  MRSA PCR Screening     Status: None   Collection Time: 05/08/19  1:51 PM   Specimen: Nasopharyngeal  Result Value Ref Range   MRSA by PCR NEGATIVE NEGATIVE    Comment:        The GeneXpert MRSA Assay (FDA approved for NASAL specimens only), is one component of a comprehensive MRSA colonization surveillance program. It is not intended to diagnose MRSA infection nor to guide or monitor treatment for MRSA infections. Performed at [facility], [address] [place], [place] [address]   Glucose, capillary     Status: Abnormal   Collection Time: 05/08/19  4:20 PM  Result Value Ref Range   Glucose-Capillary 129 (H) 70 - 99 mg/dL  Glucose, capillary     Status: None   Collection Time: 05/08/19 10:04 PM  Result Value Ref Range   Glucose-Capillary 97 70 - 99 mg/dL  Magnesium     Status: None   Collection Time: 05/09/19  5:01 AM  Result Value Ref Range   Magnesium 1.9 1.7 - 2.4 mg/dL    Comment: Performed at [facility], [address] [place], [place] [address]  CBC     Status: Abnormal   Collection Time: 05/09/19  5:01 AM  Result Value Ref Range   WBC 4.7 4.0 - 10.5 K/uL   RBC 4.00 (L) 4.22 - 5.81 MIL/uL   Hemoglobin 11.8 (L)  13.0 - 17.0 g/dL   HCT 37.1 (L) 39.0 - 52.0 %   MCV 92.8 80.0 - 100.0 fL   MCH 29.5 26.0 - 34.0 pg   MCHC 31.8 30.0 - 36.0 g/dL   RDW 12.7 11.5 - 15.5 %     Platelets 158 150 - 400 K/uL   nRBC 0.0 0.0 - 0.2 %    Comment: Performed at [facility], [address] [place], [place] [address]  Basic metabolic panel     Status: Abnormal   Collection Time: 05/09/19  5:01 AM  Result Value Ref Range   Sodium 141 135 - 145 mmol/L   Potassium 3.4 (L) 3.5 - 5.1 mmol/L   Chloride 106 98 - 111 mmol/L   CO2 26 22 - 32 mmol/L   Glucose, Bld 109 (H) 70 - 99 mg/dL   BUN 12 8 - 23 mg/dL   Creatinine, Ser 0.60 (L) 0.61 - 1.24 mg/dL   Calcium 8.6 (L) 8.9 - 10.3 mg/dL   GFR calc non Af Amer >60 >60 mL/min   GFR calc Af Amer >60 >60 mL/min   Anion gap 9 5 - 15    Comment: Performed at [facility], [address] [place], [place] [address]  Glucose, capillary     Status: Abnormal   Collection Time: 05/09/19  7:46 AM  Result Value Ref Range   Glucose-Capillary 106 (H) 70 - 99 mg/dL  Glucose, capillary     Status: Abnormal   Collection Time: 05/09/19  8:54 AM  Result Value Ref Range   Glucose-Capillary 116 (H) 70 - 99 mg/dL  Glucose, capillary     Status: None   Collection Time: 05/09/19 11:08 AM  Result Value Ref Range   Glucose-Capillary 99 70 - 99 mg/dL  Glucose, capillary     Status: Abnormal   Collection Time: 05/09/19  3:55 PM  Result Value Ref Range   Glucose-Capillary 164 (H) 70 - 99 mg/dL   Colonoscopy 8/12 The perianal and digital rectal examinations were normal. Initially, prep was poor in the rectum with formed and semi-formed stool flowing out of the rectum. This was ultimately washed and suctioned repeatedly to gain an adequate view. Long, redundant, tortuous colon. External abdominal pressure required to reach the cecum. Fungating mass arising out of the cecum, pretty much replacing the normal cecal mucosa. Please see multiple photos. Biopsies taken. (2) 5 mm polyps at hepatic  flexure removed with cold snare. There were a couple of polyps just distal to the cecal mass which were not manipulated. Remainder of the colonic mucosa including rectum (on?face and retroflexed) revealed normal mucosa. Findings: -Cecal tumor - status post biopsy. Colonic polyps as described above. Elongated, redundant colon.   Assessment & Plan:  Jedrek E Hopes is a 73 y.o. 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 He has stable Hg, no nausea, vomiting, or hematochezia. He has no abdominal pain, no fever, chills or night sweats. His colonoscopy on 8/11 showed a fungating cecal mass.   - Clear liquid diet - Echo to evaluate heart function  - CT abd pelvis and CXR to assess staging  - Resection of cecal mass  - Optimize electrolytes    Mai-Tram Riquier] 05/09/2019, 4:57 PM        

## 2019-05-09 NOTE — Progress Notes (Signed)
Spoke with patient's nurse, Izora Gala. Patient has had two tap water enemas this morning, running clear. For colonoscopy later this morning.   Laureen Ochs. Bernarda Caffey [facility] [phone] 8/12/20207:46 AM

## 2019-05-09 NOTE — Consult Note (Addendum)
I was present with the medical student for this service. I personally verified the history of present illness, performed the physical exam, and made the plan for this encounter. I have verified the medical student's documentation and made modifications where appropriately. I have personally documented in my own words a brief history, physical, and plan below.     73 yo only oriented to self and location, somewhat to situation. Who is a ward of the state. He says his mother, father and brother have died, and he has no other family.  He has lived at [facility] for sometime. 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 Gala Romney today.  The patient is non ambulatory and he reports this is after having surgery on his foot. He says he has been bedbound/ or using a wheelchair for a long time.  He has never had abdominal surgery.  He is unsure of any cancers in his family.   This mass is cancer until proven otherwise.   I ordered workup for staging including CEA, CXR, CT a/p which demonstrates the cecal mass but no signs of metastatic disease. Patient's abdomen is large and lateral walls are lax but thickness of the abdominal wall is about average. Most of his abdominal fat is peritoneal in nature based on abundant mesentery.   Also ordered ECHO for preoperative evaluation.  Soft, nondistended obese abdomen  Frog leg in bed, almost clubbed feet   Will need to speak with Marinda Elk, guardian to discuss the need for surgery and plans moving forward.   Given is size and the laxity of the side walls, I worry about exposure. Will start laparoscopically to help with mobilization. Plan for laparoscopic partial colectomy Friday.  Will redo bowel prep with some doses of miralax as it is reported to not be great and keep on clears. Will make sure we have blood consent.  Multiple risk including bleeding, infection, anastomotic breakdown, recurrence, need for open surgery, injury to ureter,  and post operative complications like pneumonia will need to be discussed with Ms. Harrington Challenger. Will call her tomorrow.  Curlene Labrum, MD [facility] [address] [place], [place] [address] [phone] (office)  [facility] History and Physical  Reason for Referral: Cecal mass Referring Physician: GI - Dr Manus Rudd   [name] is a 73 y.o. male.  HPI: [name] is a 73 y.o. 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 Patient has had about a year of iron deficiency anemia where he gets IV iron from a hematologist. A year ago his hbg was 8.5, five months ago it was 14.1 and today it was 11.8 stable from 12.2 yesterday. Colonoscopy on 8/12 showed a cecal tumor status post biopsy, colonic polyps, and an elongated redundant colon.   On exam he was AxOX2 oriented to person and event but not time. He was pleasant and cooperative but was confused at times and could not follow all commands.  According to nurse he has had no nausea, vomiting, hematochezia, dark stools while he has been here. No fevers, chills or night sweats. No cough or chest pain.  Last year he had an EGD that showed 3-6 mm gastric nodules and gastritis. He has a history of PUD. He also has chronic SOB on exertion.   He was living at a skilled nursing facility and has a  legal guardian Marinda Elk because he has no family.   Past Medical History:  Diagnosis Date  . Bell's palsy   . Bleeding ulcer    remote past  . Dementia    disoriented to time.does not know medical hx.information obtained from previous charts  . Hyperlipemia   . Hypertension   . Obesity   . SOB (shortness of breath) on exertion     Past Surgical History:  Procedure Laterality Date  . COLONOSCOPY  10/06/06   internal hemorrhoids and anal papilla/poor prep  . ESOPHAGOGASTRODUODENOSCOPY N/A  05/01/2018   multiple 3-6 mm gastric nodules and gastritis, surrounding telangectasias. Biopsy with atrophic gastritis.   Marland Kitchen FOOT SURGERY    . KNEE ARTHROSCOPY  06/17/2011   Procedure: ARTHROSCOPY KNEE;  Surgeon: Sanjuana Kava;  Location: [facility];  Service: Orthopedics;  Laterality: Right;    Family History  Problem Relation Age of Onset  . COPD Mother   . Cancer Brother   . Anesthesia problems Neg Hx   . Hypotension Neg Hx   . Malignant hyperthermia Neg Hx   . Pseudochol deficiency Neg Hx   . Colon cancer Neg Hx     Social History   Tobacco Use  . Smoking status: Former Smoker    Packs/day: 1.00    Years: 30.00    Pack years: 30.00    Types: Cigarettes  . Smokeless tobacco: Never Used  . Tobacco comment: quit a "long time ago"   Substance Use Topics  . Alcohol use: No  . Drug use: No    Medications:  Medications in chart were reviewed.  Current Facility-Administered Medications  Medication Dose Route Frequency Provider Last Rate Last Dose  . 0.9 %  sodium chloride infusion  250 mL Intravenous PRN Daneil Dolin, MD      . acetaminophen (TYLENOL) tablet 650 mg  650 mg Oral Q6H PRN Daneil Dolin, MD       Or  . acetaminophen (TYLENOL) suppository 650 mg  650 mg Rectal Q6H PRN Daneil Dolin, MD      . atenolol (TENORMIN) tablet 25 mg  25 mg Oral Daily Daneil Dolin, MD   25 mg at 05/09/19 1252  . atorvastatin (LIPITOR) tablet 10 mg  10 mg Oral QHS Daneil Dolin, MD   10 mg at 05/09/19 2024  . furosemide (LASIX) tablet 40 mg  40 mg Oral Daily Daneil Dolin, MD   40 mg at 05/09/19 1250  . insulin aspart (novoLOG) injection 0-9 Units  0-9 Units Subcutaneous TID WC Daneil Dolin, MD   1 Units at 05/08/19 1706  . ipratropium-albuterol (DUONEB) 0.5-2.5 (3) MG/3ML nebulizer solution 3 mL  3 mL Nebulization Q8H PRN Rourk, Cristopher Estimable, MD      . ondansetron [facility]) tablet 4 mg  4 mg Oral Q6H PRN Daneil Dolin, MD       Or  . ondansetron [facility]) injection 4 mg  4 mg  Intravenous Q6H PRN Rourk, Cristopher Estimable, MD      . polyvinyl alcohol (LIQUIFILM TEARS) 1.4 % ophthalmic solution 1 drop  1 drop Both Eyes BID Daneil Dolin, MD   1 drop at 05/09/19 2024  . sodium chloride flush (NS) 0.9 % injection 3 mL  3 mL Intravenous Q12H Daneil Dolin, MD   3 mL at 05/09/19 2024  . sodium chloride flush (NS) 0.9 % injection 3 mL  3 mL Intravenous PRN Rourk, Cristopher Estimable, MD      .  traZODone (DESYREL) tablet 25 mg  25 mg Oral QHS PRN Rourk, Cristopher Estimable, MD      . vitamin B-12 (CYANOCOBALAMIN) tablet 1,000 mcg  1,000 mcg Oral Daily Daneil Dolin, MD   1,000 mcg at 05/09/19 1250   Medications Prior to Admission  Medication Sig Dispense Refill  . atenolol (TENORMIN) 25 MG tablet Take 1 tablet (25 mg total) by mouth daily. 30 tablet 0  . atorvastatin (LIPITOR) 10 MG tablet Take 10 mg by mouth at bedtime.     . carboxymethylcellulose (REFRESH TEARS) 0.5 % SOLN Apply 1 drop to eye 2 (two) times daily.     . cholecalciferol (VITAMIN D) 400 units TABS tablet Take 400 Units by mouth daily.    . ferrous sulfate 325 (65 FE) MG EC tablet Take 325 mg by mouth 2 (two) times daily.    . furosemide (LASIX) 40 MG tablet Take 40 mg by mouth daily.     Marland Kitchen ipratropium-albuterol (DUONEB) 0.5-2.5 (3) MG/3ML SOLN Take 3 mLs by nebulization every 8 (eight) hours as needed (for shortness of breath).    Marland Kitchen ketoconazole (NIZORAL) 2 % cream Apply 1 application topically 2 (two) times daily as needed for irritation.    . magnesium hydroxide (MILK OF MAGNESIA) 400 MG/5ML suspension Take 30 mLs by mouth daily as needed for mild constipation or moderate constipation. For constipation     . metFORMIN (GLUCOPHAGE) 500 MG tablet Take 500 mg by mouth at bedtime.    . mometasone (ELOCON) 0.1 % cream Apply 1 application topically daily as needed (for dry skin).    . nystatin (MYCOSTATIN/NYSTOP) powder Apply topically 2 (two) times daily. Apply to groin and penis topically every day and evening shift to promote skin  healing    . nystatin-triamcinolone (MYCOLOG II) cream Apply 1 application topically daily as needed (for rash).    . potassium chloride SA (K-DUR,KLOR-CON) 20 MEQ tablet Take 20 mEq by mouth 2 (two) times daily.     . vitamin B-12 (CYANOCOBALAMIN) 1000 MCG tablet Take 1,000 mcg by mouth daily.     ROS:  A comprehensive review of systems was negative. But limited due to the patient's ability to answer all questions.  He complains of no abdominal pain, no nausea/vomiting +nonambulatory   Blood pressure 128/71, pulse 74, temperature 98.3 F (36.8 C), temperature source Oral, resp. rate 19, height 5\' 7"  (1.702 m), weight 118.1 kg, SpO2 100 %. Physical Exam  Constitutional: He is well-developed, well-nourished, and in no distress. Vital signs are normal. No distress.  HENT:  Head: Normocephalic and atraumatic.  Eyes: Pupils are equal, round, and reactive to light.  Neck: Normal range of motion. Neck supple.  Cardiovascular: Normal rate, regular rhythm and normal heart sounds.  Pulmonary/Chest: Effort normal and breath sounds normal.  Abdominal: Soft. Bowel sounds are normal. He exhibits no distension. There is no abdominal tenderness. There is no rebound and no guarding.  Musculoskeletal: Normal range of motion.     Comments: Patient is bed-bound. Normal muscle tone, no atrophy. Feet are swollen and large, not erythematous. No leg edema. Normal sensation on legs.   Neurological: He is alert.  Patient is A&OX2 oriented to person and place but not time. He does not understand all commands, cooperative.   Skin: Skin is warm and dry.  Psychiatric: Affect normal.  Vitals reviewed.   Results: Results for orders placed or performed during the hospital encounter of 05/08/19 (from the past 48 hour(s))  SARS Coronavirus 2 [facility]  order, Performed in [facility] hospital lab) Nasopharyngeal Nasopharyngeal Swab     Status: None   Collection Time: 05/08/19  8:13 AM   Specimen: Nasopharyngeal Swab   Result Value Ref Range   SARS Coronavirus 2 NEGATIVE NEGATIVE    Comment: (NOTE) If result is NEGATIVE SARS-CoV-2 target nucleic acids are NOT DETECTED. The SARS-CoV-2 RNA is generally detectable in upper and lower  respiratory specimens during the acute phase of infection. The lowest  concentration of SARS-CoV-2 viral copies this assay can detect is 250  copies / mL. A negative result does not preclude SARS-CoV-2 infection  and should not be used as the sole basis for treatment or other  patient management decisions.  A negative result may occur with  improper specimen collection / handling, submission of specimen other  than nasopharyngeal swab, presence of viral mutation(s) within the  areas targeted by this assay, and inadequate number of viral copies  (<250 copies / mL). A negative result must be combined with clinical  observations, patient history, and epidemiological information. If result is POSITIVE SARS-CoV-2 target nucleic acids are DETECTED. The SARS-CoV-2 RNA is generally detectable in upper and lower  respiratory specimens dur ing the acute phase of infection.  Positive  results are indicative of active infection with SARS-CoV-2.  Clinical  correlation with patient history and other diagnostic information is  necessary to determine patient infection status.  Positive results do  not rule out bacterial infection or co-infection with other viruses. If result is PRESUMPTIVE POSTIVE SARS-CoV-2 nucleic acids MAY BE PRESENT.   A presumptive positive result was obtained on the submitted specimen  and confirmed on repeat testing.  While 2019 novel coronavirus  (SARS-CoV-2) nucleic acids may be present in the submitted sample  additional confirmatory testing may be necessary for epidemiological  and / or clinical management purposes  to differentiate between  SARS-CoV-2 and other Sarbecovirus currently known to infect humans.  If clinically indicated additional testing with  an alternate test  methodology [phone]) is advised. The SARS-CoV-2 RNA is generally  detectable in upper and lower respiratory sp ecimens during the acute  phase of infection. The expected result is Negative. Fact Sheet for Patients:  StrictlyIdeas.no Fact Sheet for Healthcare Providers: BankingDealers.co.za This test is not yet approved or cleared by the [place] FDA and has been authorized for detection and/or diagnosis of SARS-CoV-2 by FDA under an Emergency Use Authorization (EUA).  This EUA will remain in effect (meaning this test can be used) for the duration of the COVID-19 declaration under Section 564(b)(1) of the Act, 21 U.S.C. section 360bbb-3(b)(1), unless the authorization is terminated or revoked sooner. Performed at [facility], [address] [place], [place] [address]   Hemoglobin A1c     Status: None   Collection Time: 05/08/19  9:28 AM  Result Value Ref Range   Hgb A1c MFr Bld 5.5 4.8 - 5.6 %    Comment: (NOTE) Pre diabetes:          5.7%-6.4% Diabetes:              >6.4% Glycemic control for   <7.0% adults with diabetes    Mean Plasma Glucose 111.15 mg/dL    Comment: Performed at [facility] [address], [place], [place] [address]  CBC     Status: Abnormal   Collection Time: 05/08/19  9:28 AM  Result Value Ref Range   WBC 5.8 4.0 - 10.5 K/uL   RBC 4.10 (L) 4.22 -  5.81 MIL/uL   Hemoglobin 12.2 (L) 13.0 - 17.0 g/dL   HCT 39.0 39.0 - 52.0 %   MCV 95.1 80.0 - 100.0 fL   MCH 29.8 26.0 - 34.0 pg   MCHC 31.3 30.0 - 36.0 g/dL   RDW 12.6 11.5 - 15.5 %   Platelets 168 150 - 400 K/uL   nRBC 0.0 0.0 - 0.2 %    Comment: Performed at [facility], [address], [place], [place] [address]  Creatinine, serum     Status: None   Collection Time: 05/08/19  9:28 AM  Result Value Ref Range   Creatinine, Ser 0.62 0.61 - 1.24 mg/dL   GFR calc non Af Amer >60 >60 mL/min   GFR calc Af Amer >60 >60 mL/min     Comment: Performed at [facility], [address] [place], [place] [address]  Basic metabolic panel     Status: Abnormal   Collection Time: 05/08/19  9:28 AM  Result Value Ref Range   Sodium 143 135 - 145 mmol/L   Potassium 4.0 3.5 - 5.1 mmol/L   Chloride 107 98 - 111 mmol/L   CO2 22 22 - 32 mmol/L   Glucose, Bld 117 (H) 70 - 99 mg/dL   BUN 14 8 - 23 mg/dL   Creatinine, Ser 0.70 0.61 - 1.24 mg/dL   Calcium 8.6 (L) 8.9 - 10.3 mg/dL   GFR calc non Af Amer >60 >60 mL/min   GFR calc Af Amer >60 >60 mL/min   Anion gap 14 5 - 15    Comment: Performed at [facility], [address] [place], [place] [address]  Glucose, capillary     Status: Abnormal   Collection Time: 05/08/19 11:15 AM  Result Value Ref Range   Glucose-Capillary 137 (H) 70 - 99 mg/dL  MRSA PCR Screening     Status: None   Collection Time: 05/08/19  1:51 PM   Specimen: Nasopharyngeal  Result Value Ref Range   MRSA by PCR NEGATIVE NEGATIVE    Comment:        The GeneXpert MRSA Assay (FDA approved for NASAL specimens only), is one component of a comprehensive MRSA colonization surveillance program. It is not intended to diagnose MRSA infection nor to guide or monitor treatment for MRSA infections. Performed at [facility], [address], [place], [place] [address]   Glucose, capillary     Status: Abnormal   Collection Time: 05/08/19  4:20 PM  Result Value Ref Range   Glucose-Capillary 129 (H) 70 - 99 mg/dL  Glucose, capillary     Status: None   Collection Time: 05/08/19 10:04 PM  Result Value Ref Range   Glucose-Capillary 97 70 - 99 mg/dL  Magnesium     Status: None   Collection Time: 05/09/19  5:01 AM  Result Value Ref Range   Magnesium 1.9 1.7 - 2.4 mg/dL    Comment: Performed at [facility], [address], [place], [place] [address]  CBC     Status: Abnormal   Collection Time: 05/09/19  5:01 AM  Result Value Ref Range   WBC 4.7 4.0 - 10.5 K/uL   RBC 4.00 (L) 4.22 - 5.81 MIL/uL   Hemoglobin 11.8 (L)  13.0 - 17.0 g/dL   HCT 37.1 (L) 39.0 - 52.0 %   MCV 92.8 80.0 - 100.0 fL   MCH 29.5 26.0 - 34.0 pg   MCHC 31.8 30.0 - 36.0 g/dL   RDW 12.7 11.5 - 15.5 %  Platelets 158 150 - 400 K/uL   nRBC 0.0 0.0 - 0.2 %    Comment: Performed at [facility], [address], [place], [place] [address]  Basic metabolic panel     Status: Abnormal   Collection Time: 05/09/19  5:01 AM  Result Value Ref Range   Sodium 141 135 - 145 mmol/L   Potassium 3.4 (L) 3.5 - 5.1 mmol/L   Chloride 106 98 - 111 mmol/L   CO2 26 22 - 32 mmol/L   Glucose, Bld 109 (H) 70 - 99 mg/dL   BUN 12 8 - 23 mg/dL   Creatinine, Ser 0.60 (L) 0.61 - 1.24 mg/dL   Calcium 8.6 (L) 8.9 - 10.3 mg/dL   GFR calc non Af Amer >60 >60 mL/min   GFR calc Af Amer >60 >60 mL/min   Anion gap 9 5 - 15    Comment: Performed at [facility], [address], [place], [place] [address]  Glucose, capillary     Status: Abnormal   Collection Time: 05/09/19  7:46 AM  Result Value Ref Range   Glucose-Capillary 106 (H) 70 - 99 mg/dL  Glucose, capillary     Status: Abnormal   Collection Time: 05/09/19  8:54 AM  Result Value Ref Range   Glucose-Capillary 116 (H) 70 - 99 mg/dL  Glucose, capillary     Status: None   Collection Time: 05/09/19 11:08 AM  Result Value Ref Range   Glucose-Capillary 99 70 - 99 mg/dL  Glucose, capillary     Status: Abnormal   Collection Time: 05/09/19  3:55 PM  Result Value Ref Range   Glucose-Capillary 164 (H) 70 - 99 mg/dL   Colonoscopy 8/12 The perianal and digital rectal examinations were normal. Initially, prep was poor in the rectum with formed and semi-formed stool flowing out of the rectum. This was ultimately washed and suctioned repeatedly to gain an adequate view. Long, redundant, tortuous colon. External abdominal pressure required to reach the cecum. Fungating mass arising out of the cecum, pretty much replacing the normal cecal mucosa. Please see multiple photos. Biopsies taken. (2) 5 mm polyps at hepatic  flexure removed with cold snare. There were a couple of polyps just distal to the cecal mass which were not manipulated. Remainder of the colonic mucosa including rectum (on?face and retroflexed) revealed normal mucosa. Findings: -Cecal tumor - status post biopsy. Colonic polyps as described above. Elongated, redundant colon.   Assessment & Plan:  [name] is a 73 y.o. 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 He has stable Hg, no nausea, vomiting, or hematochezia. He has no abdominal pain, no fever, chills or night sweats. His colonoscopy on 8/11 showed a fungating cecal mass.   - Clear liquid diet - Echo to evaluate heart function  - CT abd pelvis and CXR to assess staging  - Resection of cecal mass  - Optimize electrolytes    Mai-Tram Riquier] 05/09/2019, 4:57 PM

## 2019-05-09 NOTE — Progress Notes (Signed)
Echo completed

## 2019-05-10 ENCOUNTER — Encounter (HOSPITAL_COMMUNITY): Payer: Self-pay | Admitting: Internal Medicine

## 2019-05-10 DIAGNOSIS — C180 Malignant neoplasm of cecum: Secondary | ICD-10-CM

## 2019-05-10 LAB — BASIC METABOLIC PANEL
Anion gap: 8 (ref 5–15)
BUN: 8 mg/dL (ref 8–23)
CO2: 26 mmol/L (ref 22–32)
Calcium: 8.2 mg/dL — ABNORMAL LOW (ref 8.9–10.3)
Chloride: 104 mmol/L (ref 98–111)
Creatinine, Ser: 0.63 mg/dL (ref 0.61–1.24)
GFR calc Af Amer: >60 mL/min (ref >60)
GFR calc non Af Amer: >60 mL/min (ref >60)
Glucose, Bld: 107 mg/dL — ABNORMAL HIGH (ref 70–99)
Potassium: 3.3 mmol/L — ABNORMAL LOW (ref 3.5–5.1)
Sodium: 138 mmol/L (ref 135–145)

## 2019-05-10 LAB — GLUCOSE, CAPILLARY
Glucose-Capillary: 108 mg/dL — ABNORMAL HIGH (ref 70–99)
Glucose-Capillary: 123 mg/dL — ABNORMAL HIGH (ref 70–99)
Glucose-Capillary: 114 mg/dL — ABNORMAL HIGH (ref 70–99)
Glucose-Capillary: 113 mg/dL — ABNORMAL HIGH (ref 70–99)

## 2019-05-10 LAB — CEA: CEA: 6.1 ng/mL — ABNORMAL HIGH (ref 0.0–4.7)

## 2019-05-10 MED ORDER — METRONIDAZOLE 500 MG PO TABS
1000.0000 mg | ORAL_TABLET | Freq: Once | ORAL | Status: AC
  Administered 2019-05-10: 1000 mg via ORAL
  Filled 2019-05-10: qty 2

## 2019-05-10 MED ORDER — CHLORHEXIDINE GLUCONATE CLOTH 2 % EX PADS
6.0000 | MEDICATED_PAD | Freq: Once | CUTANEOUS | Status: AC
  Administered 2019-05-11: 6 via TOPICAL

## 2019-05-10 MED ORDER — NEOMYCIN SULFATE 500 MG PO TABS
1000.0000 mg | ORAL_TABLET | Freq: Once | ORAL | Status: AC
  Administered 2019-05-10: 1000 mg via ORAL
  Filled 2019-05-10: qty 2

## 2019-05-10 MED ORDER — POTASSIUM CHLORIDE CRYS ER 20 MEQ PO TBCR
40.0000 meq | EXTENDED_RELEASE_TABLET | Freq: Once | ORAL | Status: AC
  Administered 2019-05-10: 18:00:00 40 meq via ORAL
  Filled 2019-05-10: qty 2

## 2019-05-10 MED ORDER — CHLORHEXIDINE GLUCONATE CLOTH 2 % EX PADS
6.0000 | MEDICATED_PAD | Freq: Once | CUTANEOUS | Status: AC
  Administered 2019-05-10: 6 via TOPICAL

## 2019-05-10 MED ORDER — POLYETHYLENE GLYCOL 3350 17 G PO PACK
17.0000 g | PACK | Freq: Four times a day (QID) | ORAL | Status: AC
  Administered 2019-05-10 (×4): 17 g via ORAL
  Filled 2019-05-10 (×4): qty 1

## 2019-05-10 NOTE — Progress Notes (Addendum)
I was present with the medical student for this service. I personally verified the history of present illness, performed the physical exam, and made the plan for this encounter. I have verified the medical student's documentation and made modifications where appropriately. I have personally documented in my own words a brief history, physical, and plan below.    OR tomorrow for right colectomy. Will need to verify with Dr. Gala Romney his feelings on the high grade dysplasia of the hepatic flexure polyp and if an extended right is necessary.   Consent obtained earlier today for blood and OR. Type and Cross ordered.   Greater than 50% of the 35 minute visit was spent in counseling/ coordination of care regarding the surgery and speaking with the guardian to get consent.   Curlene Labrum, MD [facility] [address] [place], [place] [address] [phone] (office)    [facility] Progress Note  1 Day Post-Op  Subjective: [name] is 73 year old male with history of dementia, iron deficiency anemia, hypertension, diabetes here for surgical resection of a fungating cecal mass. Today [name] was pleasant cooperative and doing well. He had no pain, and no nausea or vomiting. 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   Objective: Vital signs in last 24 hours: Temp:  [97.6 F (36.4 C)-97.8 F (36.6 C)] 97.8 F (36.6 C) (08/13 0508) Pulse Rate:  [69-78] 69 (08/13 0508) Resp:  [17-18] 17 (08/13 0508) BP: (104-134)/(57-72) 104/57 (08/13 0508) SpO2:  [93 %-98 %] 93 % (08/13 0508) Last BM Date: 05/10/19  Intake/Output from previous day: 08/12 0701 - 08/13 0700 In: 0  Out: 2700 [Urine:2700] Intake/Output this shift: Total I/O In: -  Out: 2625 [Urine:2625]  General appearance: alert, cooperative and A&OX2 to person and location Head:  Normocephalic, without obvious abnormality, atraumatic Eyes: PERRL Neck: no adenopathy, no JVD and supple, symmetrical, trachea midline Resp: clear to auscultation bilaterally Cardio: regular rate and rhythm, S1, S2 normal, no murmur, click, rub or gallop GI: soft, non-tender; bowel sounds normal; no masses,  no organomegaly Extremities: no edema in legs, feet are swollen and large  Pulses: 2+ and symmetric Skin: warm and dry  Neurologic: Grossly normal  Lab Results:  Recent Labs    05/08/19 0928 05/09/19 0501  WBC 5.8 4.7  HGB 12.2* 11.8*  HCT 39.0 37.1*  PLT 168 158   BMET Recent Labs    05/09/19 0501 05/10/19 0439  NA 141 138  K 3.4* 3.3*  CL 106 104  CO2 26 26  GLUCOSE 109* 107*  BUN 12 8  CREATININE 0.60* 0.63  CALCIUM 8.6* 8.2*    Studies/Results: Dg Chest 1 View  Result Date: 05/09/2019 CLINICAL DATA:  Colon mass, chronic shortness of breath EXAM: CHEST  1 VIEW COMPARISON:  05/27/2010 FINDINGS: Mild cardiomegaly. Linear atelectasis or scar at the right base. No consolidation or effusion. No pneumothorax. IMPRESSION: No active disease.  Mildly low lung volume.  Mild cardiomegaly Electronically Signed   By: Donavan Foil M.D.   On: 05/09/2019 18:27   Ct Abdomen Pelvis W Contrast  Result Date: 05/09/2019 CLINICAL DATA:  Cecal mass on colonoscopy.  Anemia EXAM: CT ABDOMEN AND PELVIS WITH CONTRAST TECHNIQUE: Multidetector CT imaging of the abdomen and pelvis was performed using the standard protocol following bolus administration of intravenous contrast. CONTRAST:  90mL OMNIPAQUE IOHEXOL 300 MG/ML SOLN, 138mL OMNIPAQUE IOHEXOL 300 MG/ML SOLN COMPARISON:  None. FINDINGS: Lower chest: Lung bases are clear.  Small bilateral effusions Hepatobiliary: No focal hepatic lesion. No biliary duct dilatation. Gallbladder is normal. Common bile duct is normal. Pancreas: Pancreas is normal. No ductal dilatation. No pancreatic inflammation. Spleen: Normal spleen Adrenals/urinary tract:  Adrenal glands and kidneys normal. Ureters normal. Small diverticulum along the lateral aspect of the LEFT bladder wall. Stomach/Bowel: Stomach duodenum normal. Small bowel normal. Terminal ileum normal. There is a circumferential mass involving the cecum from the level of the terminal ileum to the tip. The mass measures 6.4 x 4.0 x 7.0 cm. No perforation. Appendix is short (image 64/2) No enlarged lymph nodes are evident in the ileocecal mesentery. No central mesenteric or periaortic adenopathy. No periportal adenopathy The remaining ascending and transverse colon are normal. The descending colon is collapsed. There is fluid within the sigmoid colon and rectum. There is rectal wall thickening without mass lesion (image 82/2) Vascular/Lymphatic: Abdominal aorta is normal caliber with atherosclerotic calcification. There is no retroperitoneal or periportal lymphadenopathy. No pelvic lymphadenopathy. Reproductive: Prostate small Other: No peritoneal disease Musculoskeletal: No aggressive osseous lesion. Extensive degenerative osteophytosis of the spine. IMPRESSION: 1. Circumferential mass involving the cecum from the level of the tip to the ileocecal valve consistent with colorectal carcinoma. 2. No evidence of local nodal metastasis in the ileocecal mesentery. No evidence distant nodal metastasis. 3. No liver metastasis identified. 4. No bowel obstruction. 5. Small bilateral pleural effusions. 6. Fluid within the rectum and rectal wall thickening could represent proctitis. Electronically Signed   By: Suzy Bouchard M.D.   On: 05/09/2019 19:35    Anti-infectives: Anti-infectives (From admission, onward)   Start     Dose/Rate Route Frequency Ordered Stop   05/10/19 2200  neomycin (MYCIFRADIN) tablet 1,000 mg     1,000 mg Oral  Once 05/10/19 3300     05/10/19 2200  metroNIDAZOLE (FLAGYL) tablet 1,000 mg     1,000 mg Oral Once 05/10/19 7622     05/10/19 1500  neomycin (MYCIFRADIN) tablet 1,000 mg     1,000  mg Oral  Once 05/10/19 6333 05/10/19 1556   05/10/19 1500  metroNIDAZOLE (FLAGYL) tablet 1,000 mg     1,000 mg Oral Once 05/10/19 5456 05/10/19 1556   05/10/19 1400  neomycin (MYCIFRADIN) tablet 1,000 mg     1,000 mg Oral  Once 05/10/19 2563 05/10/19 1455   05/10/19 1400  metroNIDAZOLE (FLAGYL) tablet 1,000 mg     1,000 mg Oral Once 05/10/19 8937 05/10/19 1455      Assessment/Plan:  [name] is 73 year old male with history of dementia, iron deficiency anemia, hypertension, diabetes here for surgical resection of a fungating cecal mass. Today Ms. Marinda Elk his legal guardian was called and the cecal cancer was discussed and the need for laparoscopic partial colectomy and possibly open.   The risk of surgery was discussed including but not limited to bleeding, infection, leak of the anastamosis, injury to other organs, ureters, risk of hernia formation and risk of needing chemotherapy. Discussed needing blood due to anemia, and risk of getting blood including allergic reactions and infections. The risk of anesthesia is also a factor, and Social services will need to discuss this with Dr. Hilaria Ota. He can reach them at [phone] ext [place]. [name] gave consent for the above. Will call her tomorrow at [phone] to let her know how surgery went.   If the surgery is not done the mass will continue to grow and spread or  metastasize, will cause obstruction, pain, inability to eat or drink, and ultimately he would die.   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   Plan by system:  Neuro: acetaminophen PRN for pain, trazodone PRN at night  Resp: duoneb PRN  Cardiac: continue atenolol, atorvastatin, furosemide  GI: clear liquid diet, zofran PRN, bowel regimin myralax/glycolax  Antibiotic bowel prep and entereg ordered  GU: rReplete electrolytes (K slightly low at 3.3)  Endo: insulin   Prophylaxis: SCD    LOS: 1 day    Mai-Tram Riquier] 05/10/2019

## 2019-05-10 NOTE — Progress Notes (Signed)
Spoke to Dr.  pathology today.  Cecal mass biopsies positive for adenocarcinoma.  Hepatic flexure polyp-adenomatous with high-grade dysplasia. Agree with plans for surgical resection.

## 2019-05-10 NOTE — Progress Notes (Addendum)
PROGRESS NOTE  [name] QVZ:[id number] DOB: [birth year] DOA: 05/08/2019 PCP: Caprice Renshaw, MD   Brief History:  73 y.o.year old malewho resides in a nursing home [employer]), presenting today for inpatient admission to help facilitate bowel prep. Pt was fount to beHeme positive last year with known IDA.  He was admitted in Aug 2019 with profound anemia without overt GI bleeding. Received 2 units PRBCs at that time. EGD Aug 2019 during inpatient admission with multiple 3-6 mm gastric nodules and gastritis, surrounding telangectasias.  Biopsy showed atrophic gastritis.  He is followed by hematology and has been receiving IV iron.  He was seen in GI office in June 2020 and they have arranged for patient to have colonoscopy on 05/09/19.  Colonoscopy revealed a cecal mass the pathology of which showed adenocarcinoma.  General surgery was consulted and plans lap partial colectomy on 05/12/19.  Assessment/Plan: Cecal Adenocarcinoma -05/09/19 colonoscopy--fungating cecal mass -pathology--adenocarcinoma -general surgery consult appreciated-->planning lap partial colectomy 05/11/19 -05/09/19 CT abd--no LN mets; circumferential mass involving cecum from tip to ileocecal valve -05/09/19 Echo--EF 65-70%, no WMA -CEA 6.1  Iron Deficiency Anemia -pt had been receiving IV iron at [facility]--two infusions weekly -12/06/18 ferritin = 20  Hypertension -continue atenolol  Hyperlipidemia -continue atorvastatin  Diabetes Mellitus type 2 -holding metformin -05/08/19 A1C--5.5  Hypokalemia -replete -mag 1.9  Stage 1 sacral pressure injury -local wound care     Disposition Plan:   Home when stable after surgery Family Communication:   Updated pt's Education officer, 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   Objective: Vitals:   05/09/19 1015 05/09/19 1340 05/09/19 2002 05/10/19 0508  BP:  128/71 134/72 (!) 104/57  Pulse: 77 74 78 69  Resp: 20 19 18 17   Temp:  98.3 F (36.8 C) 97.6 F (36.4 C) 97.8 F (36.6 C)  TempSrc:  Oral Oral   SpO2: 100% 100% 98% 93%  Weight:      Height:        Intake/Output Summary (Last 24 hours) at 05/10/2019 1644 Last data filed at 05/10/2019 1600 Gross per 24 hour  Intake --  Output 5325 ml  Net -5325 ml   Weight change:  Exam:   General:  Pt is alert, follows commands appropriately, not in acute distress  HEENT: No icterus, No thrush, No neck mass, [place]/AT  Cardiovascular: RRR, S1/S2, no rubs, no gallops  Respiratory: CTA bilaterally, no wheezing, no crackles, no rhonchi  Abdomen: Soft/+BS, non tender, non distended, no guarding  Extremities: 1 + LE edema, No lymphangitis, No petechiae, No rashes, no synovitis   Data Reviewed: I have personally reviewed following labs and imaging studies Basic Metabolic Panel: Recent Labs  Lab 05/08/19 0928 05/09/19 0501 05/10/19 0439  NA 143 141 138  K 4.0 3.4* 3.3*  CL 107 106 104  CO2 22 26 26   GLUCOSE 117* 109* 107*  BUN 14 12 8   CREATININE 0.70   0.62 0.60* 0.63  CALCIUM 8.6* 8.6* 8.2*  MG  --  1.9  --    Liver Function Tests: No results for input(s): AST, ALT, ALKPHOS, BILITOT, PROT, ALBUMIN in the last  168 hours. No results for input(s): LIPASE, AMYLASE in the last 168 hours. No results for input(s): AMMONIA in the last 168 hours. Coagulation Profile: No results for input(s): INR, PROTIME in the last 168 hours. CBC: Recent Labs  Lab 05/08/19 0928 05/09/19 0501  WBC 5.8 4.7  HGB 12.2* 11.8*  HCT 39.0 37.1*  MCV 95.1 92.8  PLT 168 158   Cardiac Enzymes: No results for input(s): CKTOTAL, CKMB,  CKMBINDEX, TROPONINI in the last 168 hours. BNP: Invalid input(s): POCBNP CBG: Recent Labs  Lab 05/09/19 1555 05/09/19 1957 05/10/19 0718 05/10/19 1104 05/10/19 1627  GLUCAP 164* 154* 108* 123* 114*   HbA1C: Recent Labs    05/08/19 0928  HGBA1C 5.5   Urine analysis:    Component Value Date/Time   COLORURINE YELLOW 05/27/2010 2038   APPEARANCEUR CLEAR 05/27/2010 2038   LABSPEC 1.015 05/27/2010 2038   PHURINE 5.0 05/27/2010 2038   GLUCOSEU NEGATIVE 05/27/2010 2038   HGBUR TRACE (A) 05/27/2010 2038   BILIRUBINUR NEGATIVE 05/27/2010 2038   KETONESUR NEGATIVE 05/27/2010 2038   PROTEINUR NEGATIVE 05/27/2010 2038   UROBILINOGEN 0.2 05/27/2010 2038   NITRITE NEGATIVE 05/27/2010 2038   LEUKOCYTESUR NEGATIVE 05/27/2010 2038   Sepsis Labs: @LABRCNTIP (procalcitonin:4,lacticidven:4) ) Recent Results (from the past 240 hour(s))  SARS Coronavirus 2 [facility] order, Performed in [facility] hospital lab) Nasopharyngeal Nasopharyngeal Swab     Status: None   Collection Time: 05/08/19  8:13 AM   Specimen: Nasopharyngeal Swab  Result Value Ref Range Status   SARS Coronavirus 2 NEGATIVE NEGATIVE Final    Comment: (NOTE) If result is NEGATIVE SARS-CoV-2 target nucleic acids are NOT DETECTED. The SARS-CoV-2 RNA is generally detectable in upper and lower  respiratory specimens during the acute phase of infection. The lowest  concentration of SARS-CoV-2 viral copies this assay can detect is 250  copies / mL. A negative result does not preclude SARS-CoV-2 infection  and should not be used as the sole basis for treatment or other  patient management decisions.  A negative result may occur with  improper specimen collection / handling, submission of specimen other  than nasopharyngeal swab, presence of viral mutation(s) within the  areas targeted by this assay, and inadequate number of viral copies  (<250 copies / mL). A negative result must be combined with clinical  observations,  patient history, and epidemiological information. If result is POSITIVE SARS-CoV-2 target nucleic acids are DETECTED. The SARS-CoV-2 RNA is generally detectable in upper and lower  respiratory specimens dur ing the acute phase of infection.  Positive  results are indicative of active infection with SARS-CoV-2.  Clinical  correlation with patient history and other diagnostic information is  necessary to determine patient infection status.  Positive results do  not rule out bacterial infection or co-infection with other viruses. If result is PRESUMPTIVE POSTIVE SARS-CoV-2 nucleic acids MAY BE PRESENT.   A presumptive positive result was obtained on the submitted specimen  and confirmed on repeat testing.  While 2019 novel coronavirus  (SARS-CoV-2) nucleic acids may be present in the submitted sample  additional confirmatory testing may be necessary for epidemiological  and / or clinical management purposes  to differentiate between  SARS-CoV-2 and other Sarbecovirus currently known to infect humans.  If clinically indicated additional testing with an alternate test  methodology [phone]) is advised. The SARS-CoV-2 RNA is generally  detectable in upper and lower respiratory sp ecimens during the acute  phase of infection. The expected result is Negative. Fact Sheet for Patients:  StrictlyIdeas.no Fact Sheet for Healthcare Providers: BankingDealers.co.za This test is not yet approved or cleared by the [place] FDA and has been authorized for detection and/or diagnosis of SARS-CoV-2 by FDA under an Emergency Use Authorization (EUA).  This EUA will remain in effect (meaning this test can be used) for the duration of the COVID-19 declaration under Section 564(b)(1) of the Act, 21 U.S.C. section 360bbb-3(b)(1), unless the authorization is terminated or revoked sooner. Performed at [facility], [address], [place], [place] [address]    MRSA PCR Screening     Status: None   Collection Time: 05/08/19  1:51 PM   Specimen: Nasopharyngeal  Result Value Ref Range Status   MRSA by PCR NEGATIVE NEGATIVE Final    Comment:        The GeneXpert MRSA Assay (FDA approved for NASAL specimens only), is one component of a comprehensive MRSA colonization surveillance program. It is not intended to diagnose MRSA infection nor to guide or monitor treatment for MRSA infections. Performed at [facility], [address], [place], [place] [address]      Scheduled Meds:  atenolol  25 mg Oral Daily   atorvastatin  10 mg Oral QHS   furosemide  40 mg Oral Daily   insulin aspart  0-9 Units Subcutaneous TID WC   metroNIDAZOLE  1,000 mg Oral Once   neomycin  1,000 mg Oral Once   polyethylene glycol  17 g Oral QID   polyvinyl alcohol  1 drop Both Eyes BID   sodium chloride flush  3 mL Intravenous Q12H   vitamin B-12  1,000 mcg Oral Daily   Continuous Infusions:  sodium chloride      Procedures/Studies: Dg Chest 1 View  Result Date: 05/09/2019 CLINICAL DATA:  Colon mass, chronic shortness of breath EXAM: CHEST  1 VIEW COMPARISON:  05/27/2010 FINDINGS: Mild cardiomegaly. Linear atelectasis or scar at the right base. No consolidation or effusion. No pneumothorax. IMPRESSION: No active disease.  Mildly low lung volume.  Mild cardiomegaly Electronically Signed   By: Donavan Foil M.D.   On: 05/09/2019 18:27   Ct Abdomen Pelvis W Contrast  Result Date: 05/09/2019 CLINICAL DATA:  Cecal mass on colonoscopy.  Anemia EXAM: CT ABDOMEN AND PELVIS WITH CONTRAST TECHNIQUE: Multidetector CT imaging of the abdomen and pelvis was performed using the standard protocol following bolus administration of intravenous contrast. CONTRAST:  67mL OMNIPAQUE IOHEXOL 300 MG/ML SOLN, 184mL OMNIPAQUE IOHEXOL 300 MG/ML SOLN COMPARISON:  None. FINDINGS: Lower chest: Lung bases are clear.  Small bilateral effusions Hepatobiliary: No focal hepatic lesion. No  biliary duct dilatation. Gallbladder is normal. Common bile duct is normal. Pancreas: Pancreas is normal. No ductal dilatation. No pancreatic inflammation. Spleen: Normal spleen Adrenals/urinary tract: Adrenal glands and kidneys normal. Ureters normal. Small diverticulum along the lateral aspect of the LEFT bladder wall. Stomach/Bowel: Stomach duodenum normal. Small bowel normal. Terminal ileum normal. There is a circumferential mass involving the cecum from the level of the terminal ileum to the tip. The mass measures 6.4 x 4.0 x 7.0 cm. No perforation. Appendix is short (image 64/2) No enlarged lymph nodes are evident in the ileocecal mesentery. No central mesenteric or periaortic adenopathy. No periportal adenopathy The remaining ascending and transverse colon are normal. The descending colon is collapsed. There is fluid within the sigmoid colon and rectum. There is rectal wall thickening without mass lesion (image 82/2) Vascular/Lymphatic: Abdominal aorta is normal caliber with atherosclerotic calcification. There is no retroperitoneal or periportal lymphadenopathy. No pelvic lymphadenopathy.  Reproductive: Prostate small Other: No peritoneal disease Musculoskeletal: No aggressive osseous lesion. Extensive degenerative osteophytosis of the spine. IMPRESSION: 1. Circumferential mass involving the cecum from the level of the tip to the ileocecal valve consistent with colorectal carcinoma. 2. No evidence of local nodal metastasis in the ileocecal mesentery. No evidence distant nodal metastasis. 3. No liver metastasis identified. 4. No bowel obstruction. 5. Small bilateral pleural effusions. 6. Fluid within the rectum and rectal wall thickening could represent proctitis. Electronically Signed   By: Suzy Bouchard M.D.   On: 05/09/2019 19:35    Orson Eva, DO  [facility] Pager [phone]  If 7PM-7AM, please contact night-coverage [email or web address] Password [email or web address] 05/10/2019, 4:44 PM   LOS: 1 day

## 2019-05-10 NOTE — Progress Notes (Addendum)
[facility]  Called Ms. Marinda Elk, guardian. Discussed the cecal cancer, until proven otherwise, need for surgery, plan for laparoscopic partial colectomy (possible open).  Risk of surgery including bleeding, infection, leak of the anastomosis, injury to other organs, ureters, risk of hernia formation, and risk of needing chemotherapy.    If the surgery is not done the mass will continue to grow and spread or metastasize, will cause obstruction, pain, inability to eat or drink, and ultimately he wound die.   Discussed needing blood due to anemia, and risk of getting blood including allergic reactions and infections.  Risk of anesthesia is also a factor, and Social services will need to discuss this with Dr. Hilaria Ota. He can reach them at [phone] ext [place].   [name] gave consent for the above. Will call her tomorrow at [phone] to let her know how surgery went.   Curlene Labrum, MD [facility] [address] [place], [place] [address] [phone] (office)

## 2019-05-11 ENCOUNTER — Encounter (HOSPITAL_COMMUNITY): Payer: Self-pay | Admitting: *Deleted

## 2019-05-11 ENCOUNTER — Inpatient Hospital Stay (HOSPITAL_COMMUNITY): Payer: Medicare Other | Admitting: Anesthesiology

## 2019-05-11 ENCOUNTER — Encounter (HOSPITAL_COMMUNITY)
Admission: RE | Disposition: A | Discharge status: Skilled Nursing Facility | Payer: Self-pay | Source: Home / Self Care | Attending: Internal Medicine

## 2019-05-11 DIAGNOSIS — C180 Malignant neoplasm of cecum: Principal | ICD-10-CM

## 2019-05-11 HISTORY — PX: LAPAROSCOPIC PARTIAL COLECTOMY: SHX5907

## 2019-05-11 HISTORY — PX: PARTIAL COLECTOMY: SHX5273

## 2019-05-11 LAB — GLUCOSE, CAPILLARY
Glucose-Capillary: 99 mg/dL (ref 70–99)
Glucose-Capillary: 179 mg/dL — ABNORMAL HIGH (ref 70–99)
Glucose-Capillary: 156 mg/dL — ABNORMAL HIGH (ref 70–99)
Glucose-Capillary: 134 mg/dL — ABNORMAL HIGH (ref 70–99)

## 2019-05-11 LAB — BASIC METABOLIC PANEL
Anion gap: 9 (ref 5–15)
BUN: 8 mg/dL (ref 8–23)
CO2: 27 mmol/L (ref 22–32)
Calcium: 8.3 mg/dL — ABNORMAL LOW (ref 8.9–10.3)
Chloride: 102 mmol/L (ref 98–111)
Creatinine, Ser: 0.71 mg/dL (ref 0.61–1.24)
GFR calc Af Amer: >60 mL/min (ref >60)
GFR calc non Af Amer: >60 mL/min (ref >60)
Glucose, Bld: 119 mg/dL — ABNORMAL HIGH (ref 70–99)
Potassium: 3.6 mmol/L (ref 3.5–5.1)
Sodium: 138 mmol/L (ref 135–145)

## 2019-05-11 LAB — MAGNESIUM: Magnesium: 1.8 mg/dL (ref 1.7–2.4)

## 2019-05-11 LAB — SURGICAL PCR SCREEN
MRSA, PCR: NEGATIVE
Staphylococcus aureus: NEGATIVE

## 2019-05-11 LAB — PREPARE RBC (CROSSMATCH)

## 2019-05-11 SURGERY — LAPAROSCOPIC PARTIAL COLECTOMY
Anesthesia: General

## 2019-05-11 MED ORDER — LACTATED RINGERS IV SOLN
INTRAVENOUS | Status: DC
  Administered 2019-05-11: 1000 mL via INTRAVENOUS
  Administered 2019-05-11: 09:00:00 via INTRAVENOUS

## 2019-05-11 MED ORDER — BUPIVACAINE HCL (PF) 0.5 % IJ SOLN
INTRAMUSCULAR | Status: AC
  Filled 2019-05-11: qty 30

## 2019-05-11 MED ORDER — SUGAMMADEX SODIUM 500 MG/5ML IV SOLN
INTRAVENOUS | Status: DC | PRN
  Administered 2019-05-11: 100 mg via INTRAVENOUS
  Administered 2019-05-11: 300 mg via INTRAVENOUS

## 2019-05-11 MED ORDER — HYDROMORPHONE HCL 1 MG/ML IJ SOLN: 0.5000 mg | INTRAMUSCULAR | Status: DC | PRN

## 2019-05-11 MED ORDER — MIDAZOLAM HCL 2 MG/2ML IJ SOLN: 0.5000 mg | Freq: Once | INTRAMUSCULAR | Status: DC | PRN

## 2019-05-11 MED ORDER — ACETAMINOPHEN 500 MG PO TABS
1000.0000 mg | ORAL_TABLET | Freq: Four times a day (QID) | ORAL | Status: DC
  Administered 2019-05-11 – 2019-05-15 (×16): 1000 mg via ORAL
  Filled 2019-05-11 (×16): qty 2

## 2019-05-11 MED ORDER — SUCCINYLCHOLINE CHLORIDE 200 MG/10ML IV SOSY
PREFILLED_SYRINGE | INTRAVENOUS | Status: AC
  Filled 2019-05-11: qty 10

## 2019-05-11 MED ORDER — 0.9 % SODIUM CHLORIDE (POUR BTL) OPTIME
TOPICAL | Status: DC | PRN
  Administered 2019-05-11: 07:00:00 2000 mL
  Administered 2019-05-11 (×2): 1000 mL

## 2019-05-11 MED ORDER — BUPIVACAINE LIPOSOME 1.3 % IJ SUSP
INTRAMUSCULAR | Status: DC | PRN
  Administered 2019-05-11: 20 mL

## 2019-05-11 MED ORDER — DOCUSATE SODIUM 100 MG PO CAPS
100.0000 mg | ORAL_CAPSULE | Freq: Two times a day (BID) | ORAL | Status: DC
  Administered 2019-05-11 – 2019-05-15 (×8): 100 mg via ORAL
  Filled 2019-05-11 (×8): qty 1

## 2019-05-11 MED ORDER — PHENYLEPHRINE 40 MCG/ML (10ML) SYRINGE FOR IV PUSH (FOR BLOOD PRESSURE SUPPORT)
PREFILLED_SYRINGE | INTRAVENOUS | Status: AC
  Filled 2019-05-11: qty 10

## 2019-05-11 MED ORDER — ROCURONIUM BROMIDE 10 MG/ML (PF) SYRINGE
PREFILLED_SYRINGE | INTRAVENOUS | Status: AC
  Filled 2019-05-11: qty 10

## 2019-05-11 MED ORDER — FENTANYL CITRATE (PF) 100 MCG/2ML IJ SOLN
INTRAMUSCULAR | Status: AC
  Filled 2019-05-11: qty 2

## 2019-05-11 MED ORDER — ACETAMINOPHEN 500 MG PO TABS
1000.0000 mg | ORAL_TABLET | ORAL | Status: AC
  Administered 2019-05-11: 1000 mg via ORAL
  Filled 2019-05-11: qty 2

## 2019-05-11 MED ORDER — SUGAMMADEX SODIUM 500 MG/5ML IV SOLN
INTRAVENOUS | Status: AC
  Filled 2019-05-11: qty 5

## 2019-05-11 MED ORDER — PROPOFOL 10 MG/ML IV BOLUS
INTRAVENOUS | Status: DC | PRN
  Administered 2019-05-11: 120 mg via INTRAVENOUS

## 2019-05-11 MED ORDER — SUCCINYLCHOLINE CHLORIDE 20 MG/ML IJ SOLN
INTRAMUSCULAR | Status: DC | PRN
  Administered 2019-05-11: 180 mg via INTRAVENOUS

## 2019-05-11 MED ORDER — ALVIMOPAN 12 MG PO CAPS
12.0000 mg | ORAL_CAPSULE | ORAL | Status: AC
  Administered 2019-05-11: 12 mg via ORAL
  Filled 2019-05-11: qty 1

## 2019-05-11 MED ORDER — DEXAMETHASONE SODIUM PHOSPHATE 10 MG/ML IJ SOLN
INTRAMUSCULAR | Status: DC | PRN
  Administered 2019-05-11: 10 mg via INTRAVENOUS

## 2019-05-11 MED ORDER — PROPOFOL 10 MG/ML IV BOLUS
INTRAVENOUS | Status: AC
  Filled 2019-05-11: qty 40

## 2019-05-11 MED ORDER — FENTANYL CITRATE (PF) 250 MCG/5ML IJ SOLN
INTRAMUSCULAR | Status: AC
  Filled 2019-05-11: qty 5

## 2019-05-11 MED ORDER — OXYCODONE HCL 5 MG PO TABS: 5.0000 mg | ORAL_TABLET | ORAL | Status: DC | PRN

## 2019-05-11 MED ORDER — FENTANYL CITRATE (PF) 100 MCG/2ML IJ SOLN
INTRAMUSCULAR | Status: DC | PRN
  Administered 2019-05-11: 50 ug via INTRAVENOUS
  Administered 2019-05-11: 25 ug via INTRAVENOUS
  Administered 2019-05-11 (×4): 50 ug via INTRAVENOUS

## 2019-05-11 MED ORDER — SODIUM CHLORIDE 0.9 % IV SOLN
2.0000 g | INTRAVENOUS | Status: AC
  Administered 2019-05-11: 2 g via INTRAVENOUS
  Filled 2019-05-11 (×2): qty 2

## 2019-05-11 MED ORDER — MIDAZOLAM HCL 2 MG/2ML IJ SOLN
INTRAMUSCULAR | Status: AC
  Filled 2019-05-11: qty 2

## 2019-05-11 MED ORDER — HEPARIN SODIUM (PORCINE) 5000 UNIT/ML IJ SOLN
5000.0000 [IU] | Freq: Three times a day (TID) | INTRAMUSCULAR | Status: DC
  Administered 2019-05-11 – 2019-05-15 (×12): 5000 [IU] via SUBCUTANEOUS
  Filled 2019-05-11 (×13): qty 1

## 2019-05-11 MED ORDER — LACTATED RINGERS IV SOLN
INTRAVENOUS | Status: DC
  Administered 2019-05-11 – 2019-05-14 (×2): via INTRAVENOUS

## 2019-05-11 MED ORDER — LIDOCAINE HCL 1 % IJ SOLN
INTRAMUSCULAR | Status: DC | PRN
  Administered 2019-05-11: 40 mg via INTRADERMAL
  Administered 2019-05-11: 60 mg via INTRADERMAL

## 2019-05-11 MED ORDER — LIDOCAINE 2% (20 MG/ML) 5 ML SYRINGE
INTRAMUSCULAR | Status: AC
  Filled 2019-05-11: qty 5

## 2019-05-11 MED ORDER — SODIUM CHLORIDE 0.9 % IV SOLN
2.0000 g | Freq: Two times a day (BID) | INTRAVENOUS | Status: AC
  Administered 2019-05-11 – 2019-05-12 (×3): 2 g via INTRAVENOUS
  Filled 2019-05-11 (×3): qty 2

## 2019-05-11 MED ORDER — PHENYLEPHRINE HCL (PRESSORS) 10 MG/ML IV SOLN
INTRAVENOUS | Status: DC | PRN
  Administered 2019-05-11: 80 ug via INTRAVENOUS
  Administered 2019-05-11: 120 ug via INTRAVENOUS

## 2019-05-11 MED ORDER — ROCURONIUM BROMIDE 100 MG/10ML IV SOLN
INTRAVENOUS | Status: DC | PRN
  Administered 2019-05-11: 40 mg via INTRAVENOUS
  Administered 2019-05-11: 20 mg via INTRAVENOUS
  Administered 2019-05-11: 60 mg via INTRAVENOUS

## 2019-05-11 MED ORDER — SIMETHICONE 80 MG PO CHEW: 40.0000 mg | CHEWABLE_TABLET | Freq: Four times a day (QID) | ORAL | Status: DC | PRN

## 2019-05-11 MED ORDER — PROMETHAZINE HCL 25 MG/ML IJ SOLN: 6.2500 mg | INTRAMUSCULAR | Status: DC | PRN

## 2019-05-11 MED ORDER — ALVIMOPAN 12 MG PO CAPS
12.0000 mg | ORAL_CAPSULE | Freq: Two times a day (BID) | ORAL | Status: DC
  Administered 2019-05-12 – 2019-05-13 (×3): 12 mg via ORAL
  Filled 2019-05-11 (×3): qty 1

## 2019-05-11 MED ORDER — MORPHINE SULFATE (PF) 2 MG/ML IV SOLN: 2.0000 mg | INTRAVENOUS | Status: DC | PRN

## 2019-05-11 MED ORDER — BUPIVACAINE LIPOSOME 1.3 % IJ SUSP
INTRAMUSCULAR | Status: AC
  Filled 2019-05-11: qty 20

## 2019-05-11 MED ORDER — MIDAZOLAM HCL 5 MG/5ML IJ SOLN
INTRAMUSCULAR | Status: DC | PRN
  Administered 2019-05-11: 1 mg via INTRAVENOUS

## 2019-05-11 MED ORDER — DEXAMETHASONE SODIUM PHOSPHATE 10 MG/ML IJ SOLN
INTRAMUSCULAR | Status: AC
  Filled 2019-05-11: qty 1

## 2019-05-11 SURGICAL SUPPLY — 55 items
APPLIER CLIP 11 MED OPEN (CLIP) ×4
CLIP APPLIE 11 MED OPEN (CLIP) IMPLANT
COVER LIGHT HANDLE STERIS (MISCELLANEOUS) ×8 IMPLANT
COVER WAND RF STERILE (DRAPES) ×4 IMPLANT
DRESSING ALLEVYN LIFE SACRUM (GAUZE/BANDAGES/DRESSINGS) ×2 IMPLANT
DRSG OPSITE POSTOP 4X10 (GAUZE/BANDAGES/DRESSINGS) ×2 IMPLANT
DRSG TEGADERM 2-3/8X2-3/4 SM (GAUZE/BANDAGES/DRESSINGS) ×12 IMPLANT
ELECT REM PT RETURN 9FT ADLT (ELECTROSURGICAL) ×4
ELECTRODE REM PT RTRN 9FT ADLT (ELECTROSURGICAL) ×2 IMPLANT
GAUZE 4X4 16PLY RFD (DISPOSABLE) ×4 IMPLANT
GLOVE BIO SURGEON STRL SZ 6.5 (GLOVE) ×8 IMPLANT
GLOVE BIO SURGEON STRL SZ7 (GLOVE) ×10 IMPLANT
GLOVE BIO SURGEONS STRL SZ 6.5 (GLOVE) ×4
GLOVE BIOGEL PI IND STRL 6.5 (GLOVE) ×4 IMPLANT
GLOVE BIOGEL PI IND STRL 7.0 (GLOVE) ×12 IMPLANT
GLOVE BIOGEL PI INDICATOR 6.5 (GLOVE) ×6
GLOVE BIOGEL PI INDICATOR 7.0 (GLOVE) ×12
GLOVE SURG SS PI 7.5 STRL IVOR (GLOVE) ×8 IMPLANT
GOWN STRL REUS W/ TWL LRG LVL3 (GOWN DISPOSABLE) IMPLANT
GOWN STRL REUS W/TWL LRG LVL3 (GOWN DISPOSABLE) ×26 IMPLANT
INST SET LAPROSCOPIC AP (KITS) ×4 IMPLANT
INST SET MAJOR GENERAL (KITS) ×4 IMPLANT
KIT TURNOVER CYSTO (KITS) ×4 IMPLANT
LIGASURE LAP ATLAS 10MM 37CM (INSTRUMENTS) ×4 IMPLANT
MANIFOLD NEPTUNE II (INSTRUMENTS) ×4 IMPLANT
NDL HYPO 18GX1.5 BLUNT FILL (NEEDLE) ×2 IMPLANT
NEEDLE HYPO 18GX1.5 BLUNT FILL (NEEDLE) ×4 IMPLANT
NEEDLE HYPO 22GX1.5 SAFETY (NEEDLE) ×4 IMPLANT
NS IRRIG 1000ML POUR BTL (IV SOLUTION) ×14 IMPLANT
PACK COLON (CUSTOM PROCEDURE TRAY) ×4 IMPLANT
PAD ARMBOARD 7.5X6 YLW CONV (MISCELLANEOUS) ×4 IMPLANT
PENCIL HANDSWITCHING (ELECTRODE) ×4 IMPLANT
RELOAD PROXIMATE 75MM BLUE (ENDOMECHANICALS) ×8 IMPLANT
RELOAD STAPLE 75 3.8 BLU REG (ENDOMECHANICALS) IMPLANT
RETRACTOR WND ALEXIS 25 LRG (MISCELLANEOUS) IMPLANT
RTRCTR WOUND ALEXIS 25CM LRG (MISCELLANEOUS) ×4
SPONGE GAUZE 2X2 8PLY STER LF (GAUZE/BANDAGES/DRESSINGS) ×4
SPONGE GAUZE 2X2 8PLY STRL LF (GAUZE/BANDAGES/DRESSINGS) ×8 IMPLANT
SPONGE LAP 18X18 RF (DISPOSABLE) ×10 IMPLANT
STAPLER GUN LINEAR PROX 60 (STAPLE) ×4 IMPLANT
STAPLER PROXIMATE 75MM BLUE (STAPLE) ×2 IMPLANT
STAPLER VISISTAT (STAPLE) ×4 IMPLANT
SUT PDS AB CT VIOLET #0 27IN (SUTURE) ×8 IMPLANT
SUT SILK 3 0 SH CR/8 (SUTURE) ×6 IMPLANT
SUT VIC AB 3-0 SH 27 (SUTURE) ×2
SUT VIC AB 3-0 SH 27X BRD (SUTURE) IMPLANT
SYR 10ML LL (SYRINGE) ×4 IMPLANT
SYR 20ML LL LF (SYRINGE) ×8 IMPLANT
TRAY FOLEY MTR SLVR 16FR STAT (SET/KITS/TRAYS/PACK) ×4 IMPLANT
TROCAR ENDO BLADELESS 11MM (ENDOMECHANICALS) ×4 IMPLANT
TROCAR XCEL NON-BLD 5MMX100MML (ENDOMECHANICALS) ×8 IMPLANT
TROCAR XCEL UNIV SLVE 11M 100M (ENDOMECHANICALS) ×2 IMPLANT
TUBING INSUFFLATION (TUBING) ×4 IMPLANT
WARMER LAPAROSCOPE (MISCELLANEOUS) ×4 IMPLANT
YANKAUER SUCT BULB TIP 10FT TU (MISCELLANEOUS) ×8 IMPLANT

## 2019-05-11 NOTE — Discharge Instructions (Signed)
Discharge Open Abdominal Surgery Instructions:  Common Complaints: Pain at the incision site is common. This will improve with time. Take your pain medications as described below. Some nausea is common and poor appetite. The main goal is to stay hydrated the first few days after surgery.   Diet/ Activity: Diet as tolerated. You have started and tolerated a diet in the hospital, and should continue to increase what you are able to eat.   You may not have a large appetite, but it is important to stay hydrated. Drink 64 ounces of water a day. Your appetite will return with time.  Keep a dry dressing in place over your staples daily or as needed. Some minor pink/ blood tinged drainage is expected. This will stop in a few days after surgery.  Shower per your regular routine daily.  Do not take hot showers. Take warm showers that are less than 10 minutes. Path the incision dry. Wear an abdominal binder daily with activity. You do not have to wear this while sleeping or sitting.  Rest and listen to your body, but do not remain in bed all day.  Walk everyday for at least 15-20 minutes. Deep cough and move around every 1-2 hours in the first few days after surgery.  Do not lift > 10 lbs, perform excessive bending, pushing, pulling, squatting for 6-8 weeks after surgery.  The activity restrictions and the abdominal binder are to prevent hernia formation at your incision while you are healing.  Do not place lotions or balms on your incision unless instructed to specifically by Dr. Constance Haw.   Medication: Take tylenol and ibuprofen as needed for pain control, alternating every 4-6 hours.  Example:  Tylenol 1000mg  @ 6am, 12noon, 6pm, 36midnight (Do not exceed 4000mg  of tylenol a day). Ibuprofen 800mg  @ 9am, 3pm, 9pm, 3am (Do not exceed 3600mg  of ibuprofen a day).  Take Roxicodone for breakthrough pain every 4 hours.  Take Colace for constipation related to narcotic pain medication. If you do not have a  bowel movement in 2 days, take Miralax over the counter.  Drink plenty of water to also prevent constipation.   Contact Information: If you have questions or concerns, please call our office, [phone], Monday- Thursday 8AM-5PM and Friday 8AM-12Noon.  If it is after hours or on the weekend, please call [facility] Main Number, [phone], and ask to speak to the surgeon on call for Dr. Constance Haw at [facility].     Open Colectomy, Care After This sheet gives you information about how to care for yourself after your procedure. Your health care provider may also give you more specific instructions. If you have problems or questions, contact your health care provider. What can I expect after the procedure? After the procedure, it is common to have:  Pain in your abdomen, especially along your incision.  Tiredness. Your energy level will return to normal over the next several weeks.  Constipation.  Nausea.  Difficulty urinating. Follow these instructions at home: Activity  You may be able to return to most of your normal activities within 1-2 weeks, such as working, walking up stairs, and sexual activity.  Avoid activities that require a lot of energy for 4-6 weeks after surgery, such as running, climbing, and lifting heavy objects. Ask your health care provider what activities are safe for you.  Take rest breaks during the day as needed.  Do not drive for 1-2 weeks or until your health care provider says that it is safe.  Do  not drive or use heavy machinery while taking prescription pain medicines.  Do not lift anything that is heavier than 10 lb (4.3 kg) until your health care provider says that it is safe. Incision care   Follow instructions from your health care provider about how to take care of your incision. Make sure you: ? Wash your hands with soap and water before you change your bandage (dressing). If soap and water are not available, use hand sanitizer. ? Change your  dressing as told by your health care provider. ? Leave stitches (sutures) or staples in place. These skin closures may need to stay in place for 2 weeks or longer.  Avoid wearing tight clothing around your incision.  Protect your incision area from the sun.  Check your incision area every day for signs of infection. Check for: ? More redness, swelling, or pain. ? More fluid or blood. ? Warmth. ? Pus or a bad smell. General instructions  Do not take baths, swim, or use a hot tub until your health care provider approves. Ask your health care provider when you may shower.  Take over-the-counter and prescription medicines, including stool softeners, only as told by your health care provider.  Eat a low-fat and low-fiber diet for the first 4 weeks after surgery.  Keep all follow-up visits as told by your health care provider. This is important. Contact a health care provider if:  You have more redness, swelling, or pain around your incision.  You have more fluid or blood coming from your incision.  Your incision feels warm to the touch.  You have pus or a bad smell coming from your incision.  You have a fever or chills.  You do not have a bowel movement 2-3 days after surgery.  You cannot eat or drink for 24 hours or more.  You have persistent nausea and vomiting.  You have abdominal pain that gets worse and does not get better with medicine. Get help right away if:  You have chest pain.  You have shortness of breath.  You have pain or swelling in your legs.  Your incision breaks open after your sutures or staples have been removed.  You have bleeding from the rectum. This information is not intended to replace advice given to you by your health care provider. Make sure you discuss any questions you have with your health care provider. Document Released: 04/06/2011 Document Revised: 08/26/2017 Document Reviewed: 06/14/2016 Elsevier Patient Education  2020 [employer].

## 2019-05-11 NOTE — Care Management Important Message (Signed)
Important Message  Patient Details  Name: [name] MRN: [id number] Date of Birth: 07/23/1946   Medicare Important Message Given:  Yes     Boneta Lucks, RN 05/11/2019, 2:01 PM

## 2019-05-11 NOTE — Transfer of Care (Signed)
Immediate Anesthesia Transfer of Care Note  Patient: [name]  Procedure(s) Performed: ATTEMPTED LAPAROSCOPIC PARTIAL COLECTOMY (N/A ) PARTIAL COLECTOMY (Laparotomy incision at 0837  Patient Location: PACU  Anesthesia Type:General  Level of Consciousness: awake and patient cooperative  Airway & Oxygen Therapy: Patient Spontanous Breathing and Patient connected to face mask oxygen  Post-op Assessment: Report given to RN, Post -op Vital signs reviewed and stable and Patient moving all extremities  Post vital signs: Reviewed and stable  Last Vitals:  Vitals Value Taken Time  BP 144/83 05/11/19 1047  Temp    Pulse 93 05/11/19 1048  Resp 25 05/11/19 1048  SpO2 100 % 05/11/19 1048  Vitals shown include unvalidated device data.  Last Pain:  Vitals:   05/11/19 0705  TempSrc: Oral  PainSc: 0-No pain      Patients Stated Pain Goal: 8 (76/54/65 0354)  Complications: No apparent anesthesia complications

## 2019-05-11 NOTE — Anesthesia Preprocedure Evaluation (Signed)
Anesthesia Evaluation  Patient identified by MRN, date of birth, ID band Patient awake    Reviewed: Allergy & Precautions, NPO status , Patient's Chart, lab work & pertinent test results, reviewed documented beta blocker date and time   Airway Mallampati: III  TM Distance: >3 FB Neck ROM: Full    Dental no notable dental hx. (+) Edentulous Upper, Edentulous Lower   Pulmonary shortness of breath and with exertion, former smoker,    Pulmonary exam normal breath sounds clear to auscultation       Cardiovascular Exercise Tolerance: Poor hypertension, Pt. on medications and Pt. on home beta blockers negative cardio ROS Normal cardiovascular examII Rhythm:Regular Rate:Normal  Unable to give much history     Neuro/Psych PSYCHIATRIC DISORDERS Dementia  Neuromuscular disease    GI/Hepatic Neg liver ROS, PUD,   Endo/Other  Morbid obesity  Renal/GU negative Renal ROS  negative genitourinary   Musculoskeletal negative musculoskeletal ROS (+)   Abdominal   Peds negative pediatric ROS (+)  Hematology negative hematology ROS (+) anemia ,   Anesthesia Other Findings   Reproductive/Obstetrics negative OB ROS                             Anesthesia Physical Anesthesia Plan  ASA: IV  Anesthesia Plan: General   Post-op Pain Management:    Induction: Intravenous  PONV Risk Score and Plan: 2 and Dexamethasone and Ondansetron  Airway Management Planned: Oral ETT  Additional Equipment:   Intra-op Plan:   Post-operative Plan: Extubation in OR  Informed Consent: I have reviewed the patients History and Physical, chart, labs and discussed the procedure including the risks, benefits and alternatives for the proposed anesthesia with the patient or authorized representative who has indicated his/her understanding and acceptance.     Dental advisory given  Plan Discussed with: CRNA  Anesthesia Plan  Comments: (Plan Full PPE Plan GETA -with hopeful extubation if tolerated  Consent obtained yesterday -ward of the state )        Anesthesia Quick Evaluation

## 2019-05-11 NOTE — Interval H&P Note (Signed)
History and Physical Interval Note:  05/11/2019 7:18 AM  [name]  has presented today for surgery, with the diagnosis of cecal mass.  The various methods of treatment have been discussed with the patient and family. After consideration of risks, benefits and other options for treatment, the patient has consented to  Procedure(s): LAPAROSCOPIC PARTIAL COLECTOMY (N/A) as a surgical intervention.  The patient's history has been reviewed, patient examined, no change in status, stable for surgery.  I have reviewed the patient's chart and labs.  Questions were answered to the patient's satisfaction.    Spoke with the state guardian, [name] yesterday. Patient has not questions.  [name]

## 2019-05-11 NOTE — Op Note (Signed)
[facility] Operative Note  05/11/19  Preoperative Diagnosis:  Cecal cancer    Postoperative Diagnosis: Same   Procedure(s) Performed: Laparoscopic converted to open right hemicolectomy    Surgeon: Lanell Matar. Constance Haw, MD   Assistants: No qualified resident was available    Anesthesia: General endotracheal   Anesthesiologist: Dr. Hilaria Ota, MD    Specimens:  Right colon    Estimated Blood Loss: Minimal   Blood Replacement: None    Complications: None   Wound Class: Clean contaminated    Operative Indications: Mr. Owczarzak is a 73 yo with a newly diagnosed cecal cancer found on colonoscopy this admission by Dr. Gala Romney after having chronic anemia for over a year. 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 Gala Romney that this was removed in its entirety.  The patient is a ward of the state, and I obtained consent from the guardian yesterday discussing the risk of surgery including bleeding, infection, abscess, injury to ureter or other organs, and potential need for ostomy, and possible need for chemotherapy, and the plan was to proceed.   Findings: Hardened mass in the cecum, loops of small bowel adherent at the mesentery   Procedure: The patient was taken to the operating room and placed supine. General endotracheal anesthesia was induced. Intravenous antibiotics were administered per protocol.  An orogastric tube positioned to decompress the stomach. A foley catheter was placed and the right arm was tucked and all pressure points were padded. The abdomen was prepared and draped in the usual sterile fashion.   A JACHO approved timeout was performed.  A supraumbilical incision was made vertically for a 11 mm port, and a towel clip was used to elevate the abdominal wall.  Veress needle with the saline drop test low insufflations pressures were performed to confirm intraabdominal position. A 11 mm optiview trocar was used to  enter the abdomen, and no injury was noted to the underlying bowel or omentum.  Additional 61mm ports were placed in the suprapubic region and right upper quadrant, and an additional 11 mm port was placed in the left lower quadrant. The patient was place in [place] position with the right side up.  The patient had a very heavy and fatty omentum as well as mesentery.   The cecum was noted to have a two loops of small bowel that were not the terminal ileum adherent by their mesentery. It did not appear that the tumor had invaded the area or that there was any sign of perforation, but it was clear there had been some inflammation in the this area at some time causing scarring.  I attempted to laparoscopically take down these loops of small bowel and had my assistant elevate the cecum, but give the weight and size of the mesentery, omentum, and the hardness of the mass we could not get great exposure.  Ultimately, I started to take down some lateral attachments, and decided to convert to open as I was having difficulty manipulating the bowel safely.  The trocars were removed. The midline incision was extended, and carried down through the fascia with electrocautery. The fascia was entered with care. A wound protector was placed. The bowel was packed into the left quadrant with laparotomy pads. The colon was freed from the peritoneal attachments along the avascular line of Toldt from the cecum to the hepatic flexure. This was extended down to the ileocolic junction and the terminal ileum was mobilized.  The hepatic  flexure was fully mobilized, dividing the hepatorenal attachments, and the ascending colon and cecum were medialized, ensuring that the retroperitoneal structures were protected including the duodenum, right ureter, vessels and kidney. The retroperitoneum was very fatty.  The points of transection were determined on the terminal ileum, and over 5 cm from the cecal mass on the ascending colon. These  were divided with a linear cutting stapler.  The mesentery was scored with electrocautery and the ileocolic vessel was identified. The Ligasure was used to ligate and divide the mesentery and the ileocolic close to the base to ensure an adequate lymph node sample.  Additional branches were ligated and divided. The specimen was removed. Hemostasis was confirmed.   Towel were laid out and the bowel was aligned without twisting, and the two ends were noted to be viable. The antimesenteric boards were grasped and enterotomies were made.  A stapled side to side anastomosis was peformed with a linear cutting stapler 75 mm.  The common enterotomy was closed with a TA stapler. The lumen was over 2 fingers wide. The crotch of the staple line was protected with a 3-0 silk and the common staple line was oversewn with lembert 3-0 silk suture. The mesenteric defect was closed with a 3-0 Vicryl running suture. The abdomen was irrigated. The vast omentum was placed over the anastomosis which lined in the abdomen under no tension and without twisting of the mesentery.   Everyone changed gown and gloves and new equipment was used for closing as is protocol.  The fascia was closed with 0 PDS suture in the standard fashion. Skin was closed with staples.  Sterile honeycomb dressing and 2x2 tegaderms were placed over the wounds. Trocar sites were closed with staples.   Final inspection revealed acceptable hemostasis. All counts were correct at the end of the case. The patient was awakened from anesthesia and extubated without complication.  The patient went to the PACU in stable condition.   Curlene Labrum, MD [facility] [address] [place], [place] [address] [phone] (office)

## 2019-05-11 NOTE — Anesthesia Postprocedure Evaluation (Signed)
Anesthesia Post Note  Patient: TYREASE VANDEBERG  Procedure(s) Performed: ATTEMPTED LAPAROSCOPIC PARTIAL COLECTOMY (N/A ) PARTIAL COLECTOMY (Laparotomy incision at [phone]  Patient location during evaluation: PACU Anesthesia Type: General Level of consciousness: awake and patient cooperative Pain management: pain level controlled Vital Signs Assessment: post-procedure vital signs reviewed and stable Respiratory status: spontaneous breathing, respiratory function stable and nonlabored ventilation Cardiovascular status: blood pressure returned to baseline Postop Assessment: no apparent nausea or vomiting Anesthetic complications: no     Last Vitals:  Vitals:   05/11/19 1110 05/11/19 1116  BP:  (!) 141/87  Pulse: 90 96  Resp: 17 19  Temp:    SpO2: 98% 94%    Last Pain:  Vitals:   05/11/19 1116  TempSrc:   PainSc: 0-No pain                 Skylyn Slezak J

## 2019-05-11 NOTE — Anesthesia Procedure Notes (Signed)
Procedure Name: Intubation Date/Time: 05/11/2019 7:43 AM Performed by: Charmaine Downs, CRNA Pre-anesthesia Checklist: Patient identified, Emergency Drugs available, Suction available and Patient being monitored Patient Re-evaluated:Patient Re-evaluated prior to induction Oxygen Delivery Method: Circle system utilized Preoxygenation: Pre-oxygenation with 100% oxygen Induction Type: IV induction, Rapid sequence and Cricoid Pressure applied Laryngoscope Size: Mac and 4 Grade View: Grade II Tube size: 8.0 mm Airway Equipment and Method: Stylet Placement Confirmation: ETT inserted through vocal cords under direct vision,  positive ETCO2 and breath sounds checked- equal and bilateral Secured at: 22 cm Tube secured with: Tape Dental Injury: Teeth and Oropharynx as per pre-operative assessment  Difficulty Due To: Difficulty was anticipated and Difficult Airway- due to limited oral opening

## 2019-05-11 NOTE — TOC Progression Note (Signed)
Transition of Care [facility]) - Progression Note    Patient Details  Name: [name] MRN: [id number] Date of Birth: December 31, 1945  Transition of [facility]) CM/SW Contact  Boneta Lucks, RN Phone Number: 05/11/2019, 10:53 AM  Clinical Narrative:   Patient in surgery, Patient is from [facility]. FL2 started. TOC to follow for needs.     Expected Discharge Plan: [place]  Expected Discharge Plan and Services Expected Discharge Plan: [place] In-house Referral: Clinical Social Work

## 2019-05-11 NOTE — Progress Notes (Signed)
PROGRESS NOTE  [name] IWP:[id number] DOB: [birth year] DOA: 05/08/2019 PCP: Caprice Renshaw, MD  Brief History:  73 y.o.year old malewho resides in a nursing home([name]), presenting today for inpatient admission to help facilitate bowel prep.Pt was fount to beHeme positive last yearwith known IDA.He was admitted in Aug 2019with profound anemia without overt GI bleeding. Received 2 units PRBCs at that time. EGD Aug 2019 during inpatient admission with multiple 3-6 mm gastric nodules and gastritis, surrounding telangectasias. Biopsy showed atrophic gastritis. He is followed by hematology and has been receiving IV iron. He was seen in GI office in June 2020 and they have arranged for patient to have colonoscopy on 05/09/19.  Colonoscopy revealed a cecal mass the pathology of which showed adenocarcinoma.  General surgery was consulted and plans lap partial colectomy on 05/12/19.  Assessment/Plan: Cecal Adenocarcinoma -05/09/19 colonoscopy--fungating cecal mass -pathology--adenocarcinoma -general surgery consult appreciated-->planning lap partial colectomy 05/11/19 -05/09/19 CT abd--no LN mets; circumferential mass involving cecum from tip to ileocecal valve -05/09/19 Echo--EF 65-70%, no WMA -CEA 6.1 -05/11/19--Laparoscopic converted to open right hemicolectomy   Iron Deficiency Anemia -pt had been receiving IV iron at [facility]--two infusions weekly -12/06/18 ferritin = 20  Hypertension -continue atenolol  Hyperlipidemia -continue atorvastatin  Diabetes Mellitus type 2 -holding metformin -05/08/19 A1C--5.5  Hypokalemia -replete -mag 1.9  Stage 1 sacral pressure injury -local wound care      Disposition Plan:   Home when bowel function return  Family Communication:   Family at bedside  Consultants:    Code Status:  FULL / DNR  DVT Prophylaxis:  [place] Heparin / [place] Lovenox   Procedures: As Listed in Progress Note  Above  Antibiotics: None       Subjective: Patient denies fevers, chills, headache, chest pain, dyspnea, nausea, vomiting, diarrhea, abdominal pain, dysuria, hematuria, hematochezia, and melena.   Objective: Vitals:   05/11/19 1100 05/11/19 1110 05/11/19 1116 05/11/19 1300  BP: (!) 152/84  (!) 141/87 124/86  Pulse: 89 90 96 (!) 107  Resp: 20 17 19 18   Temp:    98.2 F (36.8 C)  TempSrc:    Oral  SpO2: 100% 98% 94% 96%  Weight:      Height:        Intake/Output Summary (Last 24 hours) at 05/11/2019 1714 Last data filed at 05/11/2019 1114 Gross per 24 hour  Intake 1690 ml  Output 3775 ml  Net -2085 ml   Weight change:  Exam:   General:  Pt is alert, follows commands appropriately, not in acute distress  HEENT: No icterus, No thrush, No neck mass, [place]/AT  Cardiovascular: RRR, S1/S2, no rubs, no gallops  Respiratory: bibasilar crackles, no wheeze  Abdomen: Soft/+BS, non tender, non distended, no guarding  Extremities: 1 + LE edema, No lymphangitis, No petechiae, No rashes, no synovitis   Data Reviewed: I have personally reviewed following labs and imaging studies Basic Metabolic Panel: Recent Labs  Lab 05/08/19 0928 05/09/19 0501 05/10/19 0439 05/11/19 0629  NA 143 141 138 138  K 4.0 3.4* 3.3* 3.6  CL 107 106 104 102  CO2 22 26 26 27   GLUCOSE 117* 109* 107* 119*  BUN 14 12 8 8   CREATININE 0.70   0.62 0.60* 0.63 0.71  CALCIUM 8.6* 8.6* 8.2* 8.3*  MG  --  1.9  --  1.8   Liver Function Tests: No results for input(s): AST, ALT, ALKPHOS, BILITOT, PROT, ALBUMIN in the last  168 hours. No results for input(s): LIPASE, AMYLASE in the last 168 hours. No results for input(s): AMMONIA in the last 168 hours. Coagulation Profile: No results for input(s): INR, PROTIME in the last 168 hours. CBC: Recent Labs  Lab 05/08/19 0928 05/09/19 0501  WBC 5.8 4.7  HGB 12.2* 11.8*  HCT 39.0 37.1*  MCV 95.1 92.8  PLT 168 158   Cardiac Enzymes: No results for  input(s): CKTOTAL, CKMB, CKMBINDEX, TROPONINI in the last 168 hours. BNP: Invalid input(s): POCBNP CBG: Recent Labs  Lab 05/10/19 1627 05/10/19 2034 05/11/19 0703 05/11/19 1105 05/11/19 1627  GLUCAP 114* 113* 99 179* 156*   HbA1C: No results for input(s): HGBA1C in the last 72 hours. Urine analysis:    Component Value Date/Time   COLORURINE YELLOW 05/27/2010 2038   APPEARANCEUR CLEAR 05/27/2010 2038   LABSPEC 1.015 05/27/2010 2038   PHURINE 5.0 05/27/2010 2038   GLUCOSEU NEGATIVE 05/27/2010 2038   HGBUR TRACE (A) 05/27/2010 2038   BILIRUBINUR NEGATIVE 05/27/2010 2038   KETONESUR NEGATIVE 05/27/2010 2038   PROTEINUR NEGATIVE 05/27/2010 2038   UROBILINOGEN 0.2 05/27/2010 2038   NITRITE NEGATIVE 05/27/2010 2038   LEUKOCYTESUR NEGATIVE 05/27/2010 2038   Sepsis Labs: @LABRCNTIP (procalcitonin:4,lacticidven:4) ) Recent Results (from the past 240 hour(s))  SARS Coronavirus 2 [facility] order, Performed in [facility] hospital lab) Nasopharyngeal Nasopharyngeal [name]     Status: None   Collection Time: 05/08/19  8:13 AM   Specimen: Nasopharyngeal [name]  Result Value Ref Range Status   SARS Coronavirus 2 NEGATIVE NEGATIVE Final    Comment: (NOTE) If result is NEGATIVE SARS-CoV-2 target nucleic acids are NOT DETECTED. The SARS-CoV-2 RNA is generally detectable in upper and lower  respiratory specimens during the acute phase of infection. The lowest  concentration of SARS-CoV-2 viral copies this assay can detect is 250  copies / mL. A negative result does not preclude SARS-CoV-2 infection  and should not be used as the sole basis for treatment or other  patient management decisions.  A negative result may occur with  improper specimen collection / handling, submission of specimen other  than nasopharyngeal [name], presence of viral mutation(s) within the  areas targeted by this assay, and inadequate number of viral copies  (<250 copies / mL). A negative result must be combined with  clinical  observations, patient history, and epidemiological information. If result is POSITIVE SARS-CoV-2 target nucleic acids are DETECTED. The SARS-CoV-2 RNA is generally detectable in upper and lower  respiratory specimens dur ing the acute phase of infection.  Positive  results are indicative of active infection with SARS-CoV-2.  Clinical  correlation with patient history and other diagnostic information is  necessary to determine patient infection status.  Positive results do  not rule out bacterial infection or co-infection with other viruses. If result is PRESUMPTIVE POSTIVE SARS-CoV-2 nucleic acids MAY BE PRESENT.   A presumptive positive result was obtained on the submitted specimen  and confirmed on repeat testing.  While 2019 novel coronavirus  (SARS-CoV-2) nucleic acids may be present in the submitted sample  additional confirmatory testing may be necessary for epidemiological  and / or clinical management purposes  to differentiate between  SARS-CoV-2 and other Sarbecovirus currently known to infect humans.  If clinically indicated additional testing with an alternate test  methodology [phone]) is advised. The SARS-CoV-2 RNA is generally  detectable in upper and lower respiratory sp ecimens during the acute  phase of infection. The expected result is Negative. Fact Sheet for Patients:  StrictlyIdeas.no  Fact Sheet for Healthcare Providers: BankingDealers.co.za This test is not yet approved or cleared by the [place] FDA and has been authorized for detection and/or diagnosis of SARS-CoV-2 by FDA under an Emergency Use Authorization (EUA).  This EUA will remain in effect (meaning this test can be used) for the duration of the COVID-19 declaration under Section 564(b)(1) of the Act, 21 U.S.C. section 360bbb-3(b)(1), unless the authorization is terminated or revoked sooner. Performed at [facility], [address]  [place], [place] [address]   MRSA PCR Screening     Status: None   Collection Time: 05/08/19  1:51 PM   Specimen: Nasopharyngeal  Result Value Ref Range Status   MRSA by PCR NEGATIVE NEGATIVE Final    Comment:        The GeneXpert MRSA Assay (FDA approved for NASAL specimens only), is one component of a comprehensive MRSA colonization surveillance program. It is not intended to diagnose MRSA infection nor to guide or monitor treatment for MRSA infections. Performed at [facility], [address] [place], [place] [address]   Surgical pcr screen     Status: None   Collection Time: 05/10/19 11:46 PM   Specimen: Nasal Mucosa; Nasal [name]  Result Value Ref Range Status   MRSA, PCR NEGATIVE NEGATIVE Final   Staphylococcus aureus NEGATIVE NEGATIVE Final    Comment: (NOTE) The Xpert SA Assay (FDA approved for NASAL specimens in patients 44 years of age and older), is one component of a comprehensive surveillance program. It is not intended to diagnose infection nor to guide or monitor treatment. Performed at [facility], [address], [place], [place] [address]      Scheduled Meds:  acetaminophen  1,000 mg Oral Q6H   [START ON 05/12/2019] alvimopan  12 mg Oral BID   atenolol  25 mg Oral Daily   atorvastatin  10 mg Oral QHS   docusate sodium  100 mg Oral BID   furosemide  40 mg Oral Daily   heparin  5,000 Units Subcutaneous Q8H   insulin aspart  0-9 Units Subcutaneous TID WC   polyvinyl alcohol  1 drop Both Eyes BID   sodium chloride flush  3 mL Intravenous Q12H   vitamin B-12  1,000 mcg Oral Daily   Continuous Infusions:  sodium chloride     cefoTEtan (CEFOTAN) IV     lactated ringers 75 mL/hr at 05/11/19 1250    Procedures/Studies: Dg Chest 1 View  Result Date: 05/09/2019 CLINICAL DATA:  Colon mass, chronic shortness of breath EXAM: CHEST  1 VIEW COMPARISON:  05/27/2010 FINDINGS: Mild cardiomegaly. Linear atelectasis or scar at the right base. No  consolidation or effusion. No pneumothorax. IMPRESSION: No active disease.  Mildly low lung volume.  Mild cardiomegaly Electronically Signed   By: Donavan Foil M.D.   On: 05/09/2019 18:27   Ct Abdomen Pelvis W Contrast  Result Date: 05/09/2019 CLINICAL DATA:  Cecal mass on colonoscopy.  Anemia EXAM: CT ABDOMEN AND PELVIS WITH CONTRAST TECHNIQUE: Multidetector CT imaging of the abdomen and pelvis was performed using the standard protocol following bolus administration of intravenous contrast. CONTRAST:  64mL OMNIPAQUE IOHEXOL 300 MG/ML SOLN, 155mL OMNIPAQUE IOHEXOL 300 MG/ML SOLN COMPARISON:  None. FINDINGS: Lower chest: Lung bases are clear.  Small bilateral effusions Hepatobiliary: No focal hepatic lesion. No biliary duct dilatation. Gallbladder is normal. Common bile duct is normal. Pancreas: Pancreas is normal. No ductal dilatation. No pancreatic inflammation. Spleen: Normal spleen Adrenals/urinary tract: Adrenal glands and kidneys normal. Ureters normal.  Small diverticulum along the lateral aspect of the LEFT bladder wall. Stomach/Bowel: Stomach duodenum normal. Small bowel normal. Terminal ileum normal. There is a circumferential mass involving the cecum from the level of the terminal ileum to the tip. The mass measures 6.4 x 4.0 x 7.0 cm. No perforation. Appendix is short (image 64/2) No enlarged lymph nodes are evident in the ileocecal mesentery. No central mesenteric or periaortic adenopathy. No periportal adenopathy The remaining ascending and transverse colon are normal. The descending colon is collapsed. There is fluid within the sigmoid colon and rectum. There is rectal wall thickening without mass lesion (image 82/2) Vascular/Lymphatic: Abdominal aorta is normal caliber with atherosclerotic calcification. There is no retroperitoneal or periportal lymphadenopathy. No pelvic lymphadenopathy. Reproductive: Prostate small Other: No peritoneal disease Musculoskeletal: No aggressive osseous lesion.  Extensive degenerative osteophytosis of the spine. IMPRESSION: 1. Circumferential mass involving the cecum from the level of the tip to the ileocecal valve consistent with colorectal carcinoma. 2. No evidence of local nodal metastasis in the ileocecal mesentery. No evidence distant nodal metastasis. 3. No liver metastasis identified. 4. No bowel obstruction. 5. Small bilateral pleural effusions. 6. Fluid within the rectum and rectal wall thickening could represent proctitis. Electronically Signed   By: Suzy Bouchard M.D.   On: 05/09/2019 19:35    Orson Eva, DO  [facility] Pager [phone]  If 7PM-7AM, please contact night-coverage [email or web address] Password [email or web address] 05/11/2019, 5:14 PM   LOS: 2 days

## 2019-05-12 LAB — GLUCOSE, CAPILLARY
Glucose-Capillary: 107 mg/dL — ABNORMAL HIGH (ref 70–99)
Glucose-Capillary: 117 mg/dL — ABNORMAL HIGH (ref 70–99)
Glucose-Capillary: 111 mg/dL — ABNORMAL HIGH (ref 70–99)
Glucose-Capillary: 129 mg/dL — ABNORMAL HIGH (ref 70–99)

## 2019-05-12 LAB — BASIC METABOLIC PANEL
Anion gap: 9 (ref 5–15)
BUN: 9 mg/dL (ref 8–23)
CO2: 26 mmol/L (ref 22–32)
Calcium: 8.4 mg/dL — ABNORMAL LOW (ref 8.9–10.3)
Chloride: 104 mmol/L (ref 98–111)
Creatinine, Ser: 0.68 mg/dL (ref 0.61–1.24)
GFR calc Af Amer: >60 mL/min (ref >60)
GFR calc non Af Amer: >60 mL/min (ref >60)
Glucose, Bld: 125 mg/dL — ABNORMAL HIGH (ref 70–99)
Potassium: 3.4 mmol/L — ABNORMAL LOW (ref 3.5–5.1)
Sodium: 139 mmol/L (ref 135–145)

## 2019-05-12 LAB — MAGNESIUM: Magnesium: 1.8 mg/dL (ref 1.7–2.4)

## 2019-05-12 LAB — CBC WITH DIFFERENTIAL/PLATELET
Abs Immature Granulocytes: 0.04 10*3/uL (ref 0.00–0.07)
Basophils Absolute: 0 10*3/uL (ref 0.0–0.1)
Basophils Relative: 0 %
Eosinophils Absolute: 0 10*3/uL (ref 0.0–0.5)
Eosinophils Relative: 0 %
HCT: 36.3 % — ABNORMAL LOW (ref 39.0–52.0)
Hemoglobin: 11.6 g/dL — ABNORMAL LOW (ref 13.0–17.0)
Immature Granulocytes: 1 %
Lymphocytes Relative: 19 %
Lymphs Abs: 1.5 10*3/uL (ref 0.7–4.0)
MCH: 29.7 pg (ref 26.0–34.0)
MCHC: 32 g/dL (ref 30.0–36.0)
MCV: 93.1 fL (ref 80.0–100.0)
Monocytes Absolute: 0.9 10*3/uL (ref 0.1–1.0)
Monocytes Relative: 12 %
Neutro Abs: 5.2 10*3/uL (ref 1.7–7.7)
Neutrophils Relative %: 68 %
Platelets: 203 10*3/uL (ref 150–400)
RBC: 3.9 MIL/uL — ABNORMAL LOW (ref 4.22–5.81)
RDW: 13 % (ref 11.5–15.5)
WBC: 7.7 10*3/uL (ref 4.0–10.5)
nRBC: 0 % (ref 0.0–0.2)

## 2019-05-12 LAB — PHOSPHORUS: Phosphorus: 2.8 mg/dL (ref 2.5–4.6)

## 2019-05-12 MED ORDER — POTASSIUM CHLORIDE 20 MEQ PO PACK
40.0000 meq | PACK | Freq: Two times a day (BID) | ORAL | Status: AC
  Administered 2019-05-12 (×2): 40 meq via ORAL
  Filled 2019-05-12 (×2): qty 2

## 2019-05-12 MED ORDER — MAGNESIUM SULFATE 2 GM/50ML IV SOLN
2.0000 g | Freq: Once | INTRAVENOUS | Status: AC
  Administered 2019-05-12: 11:00:00 2 g via INTRAVENOUS
  Filled 2019-05-12: qty 50

## 2019-05-12 NOTE — Progress Notes (Addendum)
[facility] Progress Note  1 Day Post-Op  Subjective: Looking good this AM. No complaints.  Tolerating clears. Uop good.   Objective: Vital signs in last 24 hours: Temp:  [97.7 F (36.5 C)-98.3 F (36.8 C)] 97.7 F (36.5 C) (08/15 1357) Pulse Rate:  [85-96] 85 (08/15 1357) Resp:  [17-20] 20 (08/15 1357) BP: (122-137)/(54-75) 127/54 (08/15 1357) SpO2:  [91 %-96 %] 91 % (08/15 1357) Last BM Date: 05/12/19  Intake/Output from previous day: 08/14 0701 - 08/15 0700 In: 3290 [P.O.:840; I.V.:2350; IV Piggyback:100] Out: 67 [Urine:850; Stool:2; Blood:100] Intake/Output this shift: Total I/O In: 720 [P.O.:720] Out: 902 [Urine:900; Stool:2]  General appearance: alert, cooperative and no distress Resp: normal work of breathing GI: soft, obese, midline and port site with dressing, no erythema under honeycomb or drainage Scrotal ulcer    Lab Results:  Recent Labs    05/12/19 0632  WBC 7.7  HGB 11.6*  HCT 36.3*  PLT 203   BMET Recent Labs    05/11/19 0629 05/12/19 0632  NA 138 139  K 3.6 3.4*  CL 102 104  CO2 27 26  GLUCOSE 119* 125*  BUN 8 9  CREATININE 0.71 0.68  CALCIUM 8.3* 8.4*    Anti-infectives: Anti-infectives (From admission, onward)   Start     Dose/Rate Route Frequency Ordered Stop   05/11/19 2200  cefoTEtan (CEFOTAN) 2 g in sodium chloride 0.9 % 100 mL IVPB     2 g 200 mL/hr over 30 Minutes Intravenous Every 12 hours 05/11/19 1136 05/13/19 0959   05/11/19 0630  cefoTEtan (CEFOTAN) 2 g in sodium chloride 0.9 % 100 mL IVPB     2 g 200 mL/hr over 30 Minutes Intravenous On call to O.R. 05/11/19 0618 05/12/19 1028   05/10/19 2200  neomycin (MYCIFRADIN) tablet 1,000 mg     1,000 mg Oral  Once 05/10/19 0822 05/10/19 2018   05/10/19 2200  metroNIDAZOLE (FLAGYL) tablet 1,000 mg     1,000 mg Oral Once 05/10/19 0822 05/10/19 2017   05/10/19 1500  neomycin (MYCIFRADIN) tablet 1,000 mg     1,000 mg Oral  Once 05/10/19 0822 05/10/19 1556   05/10/19 1500  metroNIDAZOLE (FLAGYL) tablet 1,000 mg     1,000 mg Oral Once 05/10/19 0822 05/10/19 1556   05/10/19 1400  neomycin (MYCIFRADIN) tablet 1,000 mg     1,000 mg Oral  Once 05/10/19 0822 05/10/19 1455   05/10/19 1400  metroNIDAZOLE (FLAGYL) tablet 1,000 mg     1,000 mg Oral Once 05/10/19 5409 05/10/19 1455      Assessment/Plan: [name] is a 73 yo s/p Lap to Open R hemicolectomy for cecal cancer. Doing fair. Had BMs -PRN for pain -OOB if possible, does not ambulate -HD ok -Full liquids added -Foley out, K replaced and Mg  -Scrotal ulcer noted in OR yesterday, not sure what best treatment will be, will let Dr. Carles Collet know to consult wound RN  SCDs, heparin sq   Once on diet, pain controlled and back to baseline can go back to SNF.  Can follow up for staple removal in office.  Will need referral to Oncology so they can determine need /appropriateness of Chemotherapy as outpatient   Dr. Arnoldo Morale will be seeing tomorrow and onward while in hospital.    LOS: 3 days    [name] 05/12/2019

## 2019-05-12 NOTE — Progress Notes (Addendum)
PROGRESS NOTE  [name] SAY:[id number] DOB: 29-Jan-1946 DOA: 05/08/2019 PCP: Caprice Renshaw, MD  Brief History:  73 y.o.year old malewho resides in a nursing home([name]), presenting today for inpatient admission to help facilitate bowel prep.Pt was fount to beHeme positive last yearwith known IDA.He was admitted in Aug 2019with profound anemia without overt GI bleeding. Received 2 units PRBCs at that time. EGD Aug 2019 during inpatient admission with multiple 3-6 mm gastric nodules and gastritis, surrounding telangectasias. Biopsy showed atrophic gastritis. He is followed by hematology and has been receiving IV iron. He was seen in GI office in June 2020 and they have arranged for patient to have colonoscopy on 05/09/19.Colonoscopy revealed a cecal mass the pathology of which showed adenocarcinoma. General surgery was consulted and plans lap partial colectomy on 05/12/19.  Assessment/Plan: CecalAdenocarcinoma -05/09/19 colonoscopy--fungating cecal mass -pathology--adenocarcinoma -general surgery consultappreciated-->planning lap partial colectomy 05/11/19 -05/09/19 CT abd--no LN mets; circumferential mass involving cecum from tip to ileocecal valve -05/09/19 Echo--EF 65-70%, no WMA -CEA 6.1 -05/11/19--Laparoscopic converted to open right hemicolectomy -post op pain controlled -8/15--case discussed with Dr. Zandra Abts to full liquid  Iron Deficiency Anemia -pt had been receiving IV iron at [facility]--two infusions weekly -12/06/18 ferritin = 20  Hypertension -continue atenolol  Hyperlipidemia -continue atorvastatin  Diabetes Mellitus type 2 -holding metformin -05/08/19 A1C--5.5 -CBGs controlled  Hypokalemia -replete -mag 1.8  Stage 1 sacral pressure injury -local wound care  Scrotal ulcer -not infected on exam -suspect moisture induced injury -consult wound care     Disposition Plan:   Home when bowel function return    Family Communication:   Family at bedside  Consultants:  general surgery  Code Status:  FULL   DVT Prophylaxis:  [place] Heparin    Procedures: As Listed in Progress Note Above  Antibiotics: Peri-op cefotetan    Subjective: Patient denies fevers, chills, headache, chest pain, dyspnea, nausea, vomiting, diarrhea, abdominal pain, dysuria, hematuria, hematochezia, and melena.   Objective: Vitals:   05/12/19 0105 05/12/19 0516 05/12/19 0925 05/12/19 1357  BP: 125/65 122/71 125/66 (!) 127/54  Pulse: 87 85 95 85  Resp: 17 18  20   Temp: 98.1 F (36.7 C) 98.2 F (36.8 C)  97.7 F (36.5 C)  TempSrc:  Oral  Oral  SpO2: 93% 96%  91%  Weight:      Height:        Intake/Output Summary (Last 24 hours) at 05/12/2019 1625 Last data filed at 05/12/2019 1500 Gross per 24 hour  Intake 2560 ml  Output 1254 ml  Net 1306 ml   Weight change:  Exam:   General:  Pt is alert, follows commands appropriately, not in acute distress  HEENT: No icterus, No thrush, No neck mass, [place]/AT  Cardiovascular: RRR, S1/S2, no rubs, no gallops  Respiratory: fine basilar crackles, no wheeze  Abdomen: Soft/+BS, non tender, non distended, no guarding  Extremities: trace LE edema, No lymphangitis, No petechiae, No rashes, no synovitis   Data Reviewed: I have personally reviewed following labs and imaging studies Basic Metabolic Panel: Recent Labs  Lab 05/08/19 0928 05/09/19 0501 05/10/19 0439 05/11/19 0629 05/12/19 0632  NA 143 141 138 138 139  K 4.0 3.4* 3.3* 3.6 3.4*  CL 107 106 104 102 104  CO2 22 26 26 27 26   GLUCOSE 117* 109* 107* 119* 125*  BUN 14 12 8 8 9   CREATININE 0.70   0.62 0.60* 0.63 0.71 0.68  CALCIUM 8.6*  8.6* 8.2* 8.3* 8.4*  MG  --  1.9  --  1.8 1.8  PHOS  --   --   --   --  2.8   Liver Function Tests: No results for input(s): AST, ALT, ALKPHOS, BILITOT, PROT, ALBUMIN in the last 168 hours. No results for input(s): LIPASE, AMYLASE in the last 168 hours. No  results for input(s): AMMONIA in the last 168 hours. Coagulation Profile: No results for input(s): INR, PROTIME in the last 168 hours. CBC: Recent Labs  Lab 05/08/19 0928 05/09/19 0501 05/12/19 0632  WBC 5.8 4.7 7.7  NEUTROABS  --   --  5.2  HGB 12.2* 11.8* 11.6*  HCT 39.0 37.1* 36.3*  MCV 95.1 92.8 93.1  PLT 168 158 203   Cardiac Enzymes: No results for input(s): CKTOTAL, CKMB, CKMBINDEX, TROPONINI in the last 168 hours. BNP: Invalid input(s): POCBNP CBG: Recent Labs  Lab 05/11/19 1627 05/11/19 2029 05/12/19 0722 05/12/19 1115 05/12/19 1606  GLUCAP 156* 134* 107* 117* 111*   HbA1C: No results for input(s): HGBA1C in the last 72 hours. Urine analysis:    Component Value Date/Time   COLORURINE YELLOW 05/27/2010 2038   APPEARANCEUR CLEAR 05/27/2010 2038   LABSPEC 1.015 05/27/2010 2038   PHURINE 5.0 05/27/2010 2038   GLUCOSEU NEGATIVE 05/27/2010 2038   HGBUR TRACE (A) 05/27/2010 2038   BILIRUBINUR NEGATIVE 05/27/2010 2038   KETONESUR NEGATIVE 05/27/2010 2038   PROTEINUR NEGATIVE 05/27/2010 2038   UROBILINOGEN 0.2 05/27/2010 2038   NITRITE NEGATIVE 05/27/2010 2038   LEUKOCYTESUR NEGATIVE 05/27/2010 2038   Sepsis Labs: @LABRCNTIP (procalcitonin:4,lacticidven:4) ) Recent Results (from the past 240 hour(s))  SARS Coronavirus 2 [facility] order, Performed in [facility] hospital lab) Nasopharyngeal Nasopharyngeal Swab     Status: None   Collection Time: 05/08/19  8:13 AM   Specimen: Nasopharyngeal Swab  Result Value Ref Range Status   SARS Coronavirus 2 NEGATIVE NEGATIVE Final    Comment: (NOTE) If result is NEGATIVE SARS-CoV-2 target nucleic acids are NOT DETECTED. The SARS-CoV-2 RNA is generally detectable in upper and lower  respiratory specimens during the acute phase of infection. The lowest  concentration of SARS-CoV-2 viral copies this assay can detect is 250  copies / mL. A negative result does not preclude SARS-CoV-2 infection  and should not be used as  the sole basis for treatment or other  patient management decisions.  A negative result may occur with  improper specimen collection / handling, submission of specimen other  than nasopharyngeal swab, presence of viral mutation(s) within the  areas targeted by this assay, and inadequate number of viral copies  (<250 copies / mL). A negative result must be combined with clinical  observations, patient history, and epidemiological information. If result is POSITIVE SARS-CoV-2 target nucleic acids are DETECTED. The SARS-CoV-2 RNA is generally detectable in upper and lower  respiratory specimens dur ing the acute phase of infection.  Positive  results are indicative of active infection with SARS-CoV-2.  Clinical  correlation with patient history and other diagnostic information is  necessary to determine patient infection status.  Positive results do  not rule out bacterial infection or co-infection with other viruses. If result is PRESUMPTIVE POSTIVE SARS-CoV-2 nucleic acids MAY BE PRESENT.   A presumptive positive result was obtained on the submitted specimen  and confirmed on repeat testing.  While 2019 novel coronavirus  (SARS-CoV-2) nucleic acids may be present in the submitted sample  additional confirmatory testing may be necessary for epidemiological  and / or  clinical management purposes  to differentiate between  SARS-CoV-2 and other Sarbecovirus currently known to infect humans.  If clinically indicated additional testing with an alternate test  methodology [phone]) is advised. The SARS-CoV-2 RNA is generally  detectable in upper and lower respiratory sp ecimens during the acute  phase of infection. The expected result is Negative. Fact Sheet for Patients:  StrictlyIdeas.no Fact Sheet for Healthcare Providers: BankingDealers.co.za This test is not yet approved or cleared by the [place] FDA and has been authorized for  detection and/or diagnosis of SARS-CoV-2 by FDA under an Emergency Use Authorization (EUA).  This EUA will remain in effect (meaning this test can be used) for the duration of the COVID-19 declaration under Section 564(b)(1) of the Act, 21 U.S.C. section 360bbb-3(b)(1), unless the authorization is terminated or revoked sooner. Performed at [facility], [address], [place], [place] [address]   MRSA PCR Screening     Status: None   Collection Time: 05/08/19  1:51 PM   Specimen: Nasopharyngeal  Result Value Ref Range Status   MRSA by PCR NEGATIVE NEGATIVE Final    Comment:        The GeneXpert MRSA Assay (FDA approved for NASAL specimens only), is one component of a comprehensive MRSA colonization surveillance program. It is not intended to diagnose MRSA infection nor to guide or monitor treatment for MRSA infections. Performed at [facility], [address], [place], [place] [address]   Surgical pcr screen     Status: None   Collection Time: 05/10/19 11:46 PM   Specimen: Nasal Mucosa; Nasal Swab  Result Value Ref Range Status   MRSA, PCR NEGATIVE NEGATIVE Final   Staphylococcus aureus NEGATIVE NEGATIVE Final    Comment: (NOTE) The Xpert SA Assay (FDA approved for NASAL specimens in patients 72 years of age and older), is one component of a comprehensive surveillance program. It is not intended to diagnose infection nor to guide or monitor treatment. Performed at [facility], [address] [place], [place] [address]      Scheduled Meds:  acetaminophen  1,000 mg Oral Q6H   alvimopan  12 mg Oral BID   atenolol  25 mg Oral Daily   atorvastatin  10 mg Oral QHS   docusate sodium  100 mg Oral BID   furosemide  40 mg Oral Daily   heparin  5,000 Units Subcutaneous Q8H   insulin aspart  0-9 Units Subcutaneous TID WC   polyvinyl alcohol  1 drop Both Eyes BID   potassium chloride  40 mEq Oral BID   sodium chloride flush  3 mL Intravenous Q12H   vitamin B-12   1,000 mcg Oral Daily   Continuous Infusions:  sodium chloride     cefoTEtan (CEFOTAN) IV 2 g (05/12/19 0931)   lactated ringers 75 mL/hr at 05/11/19 1250    Procedures/Studies: Dg Chest 1 View  Result Date: 05/09/2019 CLINICAL DATA:  Colon mass, chronic shortness of breath EXAM: CHEST  1 VIEW COMPARISON:  05/27/2010 FINDINGS: Mild cardiomegaly. Linear atelectasis or scar at the right base. No consolidation or effusion. No pneumothorax. IMPRESSION: No active disease.  Mildly low lung volume.  Mild cardiomegaly Electronically Signed   By: Donavan Foil M.D.   On: 05/09/2019 18:27   Ct Abdomen Pelvis W Contrast  Result Date: 05/09/2019 CLINICAL DATA:  Cecal mass on colonoscopy.  Anemia EXAM: CT ABDOMEN AND PELVIS WITH CONTRAST TECHNIQUE: Multidetector CT imaging of the abdomen and pelvis was performed using the standard protocol following  bolus administration of intravenous contrast. CONTRAST:  63mL OMNIPAQUE IOHEXOL 300 MG/ML SOLN, 157mL OMNIPAQUE IOHEXOL 300 MG/ML SOLN COMPARISON:  None. FINDINGS: Lower chest: Lung bases are clear.  Small bilateral effusions Hepatobiliary: No focal hepatic lesion. No biliary duct dilatation. Gallbladder is normal. Common bile duct is normal. Pancreas: Pancreas is normal. No ductal dilatation. No pancreatic inflammation. Spleen: Normal spleen Adrenals/urinary tract: Adrenal glands and kidneys normal. Ureters normal. Small diverticulum along the lateral aspect of the LEFT bladder wall. Stomach/Bowel: Stomach duodenum normal. Small bowel normal. Terminal ileum normal. There is a circumferential mass involving the cecum from the level of the terminal ileum to the tip. The mass measures 6.4 x 4.0 x 7.0 cm. No perforation. Appendix is short (image 64/2) No enlarged lymph nodes are evident in the ileocecal mesentery. No central mesenteric or periaortic adenopathy. No periportal adenopathy The remaining ascending and transverse colon are normal. The descending colon is  collapsed. There is fluid within the sigmoid colon and rectum. There is rectal wall thickening without mass lesion (image 82/2) Vascular/Lymphatic: Abdominal aorta is normal caliber with atherosclerotic calcification. There is no retroperitoneal or periportal lymphadenopathy. No pelvic lymphadenopathy. Reproductive: Prostate small Other: No peritoneal disease Musculoskeletal: No aggressive osseous lesion. Extensive degenerative osteophytosis of the spine. IMPRESSION: 1. Circumferential mass involving the cecum from the level of the tip to the ileocecal valve consistent with colorectal carcinoma. 2. No evidence of local nodal metastasis in the ileocecal mesentery. No evidence distant nodal metastasis. 3. No liver metastasis identified. 4. No bowel obstruction. 5. Small bilateral pleural effusions. 6. Fluid within the rectum and rectal wall thickening could represent proctitis. Electronically Signed   By: Suzy Bouchard M.D.   On: 05/09/2019 19:35    Orson Eva, DO  [facility] Pager [phone]  If 7PM-7AM, please contact night-coverage [email or web address] Password [email or web address] 05/12/2019, 4:25 PM   LOS: 3 days

## 2019-05-12 NOTE — Consult Note (Signed)
[facility] Nurse wound consult note Reason for Consult: Partial thickness area of skin loss at scrotum. Most likely moisture associated skin damage, but friction is a secondary etiology Wound type:Moisture, moisture plus friction Pressure Injury POA: N/A Measurement: 1cm round x 0.1cm Wound YQI:HKVQ, dry Drainage (amount, consistency, odor) None Periwound: intact with mild erythema, some dry, peeling areas, no satellite lesions Dressing procedure/placement/frequency: I will provide Nursing with guidance for care using three times daily cleansing of affected area with house incontinence cleanser as it is pH balanced and moisturizing. This will be topped with a moisture barrier ointment to protect from surface irritants and diminish friction injuries. I will provide a mattress replacement ensure a sacral foam dressing is in place.   [place] nursing team will not follow, but will remain available to this patient, the nursing and medical teams.  Please re-consult if needed. Thanks, Maudie Flakes, MSN, RN, [place], Arther Abbott  Pager# [phone]

## 2019-05-13 LAB — CBC WITH DIFFERENTIAL/PLATELET
Abs Immature Granulocytes: 0.12 10*3/uL — ABNORMAL HIGH (ref 0.00–0.07)
Basophils Absolute: 0.1 10*3/uL (ref 0.0–0.1)
Basophils Relative: 1 %
Eosinophils Absolute: 0.2 10*3/uL (ref 0.0–0.5)
Eosinophils Relative: 3 %
HCT: 34.3 % — ABNORMAL LOW (ref 39.0–52.0)
Hemoglobin: 10.6 g/dL — ABNORMAL LOW (ref 13.0–17.0)
Immature Granulocytes: 2 %
Lymphocytes Relative: 21 %
Lymphs Abs: 1.5 10*3/uL (ref 0.7–4.0)
MCH: 29.2 pg (ref 26.0–34.0)
MCHC: 30.9 g/dL (ref 30.0–36.0)
MCV: 94.5 fL (ref 80.0–100.0)
Monocytes Absolute: 0.8 10*3/uL (ref 0.1–1.0)
Monocytes Relative: 11 %
Neutro Abs: 4.5 10*3/uL (ref 1.7–7.7)
Neutrophils Relative %: 62 %
Platelets: 181 10*3/uL (ref 150–400)
RBC: 3.63 MIL/uL — ABNORMAL LOW (ref 4.22–5.81)
RDW: 13.2 % (ref 11.5–15.5)
WBC: 7.2 10*3/uL (ref 4.0–10.5)
nRBC: 0 % (ref 0.0–0.2)

## 2019-05-13 LAB — BASIC METABOLIC PANEL
Anion gap: 9 (ref 5–15)
BUN: 7 mg/dL — ABNORMAL LOW (ref 8–23)
CO2: 28 mmol/L (ref 22–32)
Calcium: 8.2 mg/dL — ABNORMAL LOW (ref 8.9–10.3)
Chloride: 100 mmol/L (ref 98–111)
Creatinine, Ser: 0.78 mg/dL (ref 0.61–1.24)
GFR calc Af Amer: >60 mL/min (ref >60)
GFR calc non Af Amer: >60 mL/min (ref >60)
Glucose, Bld: 103 mg/dL — ABNORMAL HIGH (ref 70–99)
Potassium: 3.8 mmol/L (ref 3.5–5.1)
Sodium: 137 mmol/L (ref 135–145)

## 2019-05-13 LAB — GLUCOSE, CAPILLARY
Glucose-Capillary: 95 mg/dL (ref 70–99)
Glucose-Capillary: 122 mg/dL — ABNORMAL HIGH (ref 70–99)
Glucose-Capillary: 104 mg/dL — ABNORMAL HIGH (ref 70–99)
Glucose-Capillary: 106 mg/dL — ABNORMAL HIGH (ref 70–99)

## 2019-05-13 LAB — PHOSPHORUS: Phosphorus: 2.1 mg/dL — ABNORMAL LOW (ref 2.5–4.6)

## 2019-05-13 LAB — MAGNESIUM: Magnesium: 2 mg/dL (ref 1.7–2.4)

## 2019-05-13 MED ORDER — POTASSIUM PHOSPHATE MONOBASIC 500 MG PO TABS
500.0000 mg | ORAL_TABLET | Freq: Two times a day (BID) | ORAL | Status: DC
  Filled 2019-05-13 (×2): qty 1

## 2019-05-13 MED ORDER — K PHOS MONO-SOD PHOS DI & MONO 155-852-130 MG PO TABS
500.0000 mg | ORAL_TABLET | Freq: Two times a day (BID) | ORAL | Status: DC
  Administered 2019-05-13 – 2019-05-15 (×4): 500 mg via ORAL
  Filled 2019-05-13 (×4): qty 2

## 2019-05-13 NOTE — Progress Notes (Signed)
PROGRESS NOTE  [name] PXT:[id number] DOB: 10/25/1945 DOA: 05/08/2019 PCP: Caprice Renshaw, MD Brief History: 73 y.o.year old malewho resides in a nursing home([name]), presenting today for inpatient admission to help facilitate bowel prep.Pt was fount to beHeme positive last yearwith known IDA.He was admitted in Aug 2019with profound anemia without overt GI bleeding. Received 2 units PRBCs at that time. EGD Aug 2019 during inpatient admission with multiple 3-6 mm gastric nodules and gastritis, surrounding telangectasias. Biopsy showed atrophic gastritis. He is followed by hematology and has been receiving IV iron. He was seen in GI office in June 2020 and they have arranged for patient to have colonoscopy on 05/09/19.Colonoscopy revealed a cecal mass the pathology of which showed adenocarcinoma. General surgery was consulted and plans lap partial colectomy on 05/12/19.  Assessment/Plan: CecalAdenocarcinoma -05/09/19 colonoscopy--fungating cecal mass -pathology--adenocarcinoma -general surgery consultappreciated -05/09/19 CT abd--no LN mets; circumferential mass involving cecum from tip to ileocecal valve -05/09/19 Echo--EF 65-70%, no WMA -CEA 6.1 -05/11/19--Laparoscopic converted to open right hemicolectomy -post op pain controlled -8/15--case discussed with Dr. Daun Peacock to full liquid  Iron Deficiency Anemia -pt had been receiving IV iron at [facility]--two infusions weekly -12/06/18 ferritin = 20  Hypertension -continue atenolol  Hyperlipidemia -continue atorvastatin  Diabetes Mellitus type 2 -holding metformin -05/08/19 A1C--5.5 -CBGs controlled  Hypokalemia/hypophosphatemia -repleted -mag 1.8  Stage 1 sacral pressure injury -local wound care  Scrotal ulcer -not infected on exam -suspect moisture induced injury -consult wound care-->three times daily cleansing of affected area with house incontinence cleanser as it is pH  balanced and moisturizing. This will be topped with a moisture barrier ointment to protect from surface irritants and diminish friction injuries     Disposition Plan: Home when cleared by surgery Family Communication: Family at bedside  Consultants: general surgery  Code Status: FULL   DVT Prophylaxis: [place] Heparin    Procedures: As Listed in Progress Note Above  Antibiotics: Peri-op cefotetan    Subjective: Patient denies fevers, chills, headache, chest pain, dyspnea, nausea, vomiting, diarrhea, abdominal pain, dysuria, hematuria, hematochezia, and melena. Pt had another BM in past 24 hours  Objective: Vitals:   05/12/19 1357 05/12/19 2139 05/13/19 0506 05/13/19 1000  BP: (!) 127/54 122/63 116/64 108/62  Pulse: 85 84 85 75  Resp: 20 18 17    Temp: 97.7 F (36.5 C) 98.2 F (36.8 C) 98.6 F (37 C)   TempSrc: Oral  Oral   SpO2: 91% 97% 96%   Weight:      Height:        Intake/Output Summary (Last 24 hours) at 05/13/2019 1325 Last data filed at 05/13/2019 0900 Gross per 24 hour  Intake 2840 ml  Output 2351 ml  Net 489 ml   Weight change:  Exam:   General:  Pt is alert, follows commands appropriately, not in acute distress  HEENT: No icterus, No thrush, No neck mass, [place]/AT  Cardiovascular: RRR, S1/S2, no rubs, no gallops  Respiratory: CTA bilaterally, no wheezing, no crackles, no rhonchi  Abdomen: Soft/+BS, non tender, non distended, no guarding  Extremities: No edema, No lymphangitis, No petechiae, No rashes, no synovitis   Data Reviewed: I have personally reviewed following labs and imaging studies Basic Metabolic Panel: Recent Labs  Lab 05/09/19 0501 05/10/19 0439 05/11/19 0629 05/12/19 0632 05/13/19 0630  NA 141 138 138 139 137  K 3.4* 3.3* 3.6 3.4* 3.8  CL 106 104 102 104 100  CO2 26 26 27  26 28  GLUCOSE 109* 107* 119* 125* 103*  BUN 12 8 8 9  7*  CREATININE 0.60* 0.63 0.71 0.68 0.78  CALCIUM 8.6* 8.2* 8.3* 8.4* 8.2*  MG  1.9  --  1.8 1.8 2.0  PHOS  --   --   --  2.8 2.1*   Liver Function Tests: No results for input(s): AST, ALT, ALKPHOS, BILITOT, PROT, ALBUMIN in the last 168 hours. No results for input(s): LIPASE, AMYLASE in the last 168 hours. No results for input(s): AMMONIA in the last 168 hours. Coagulation Profile: No results for input(s): INR, PROTIME in the last 168 hours. CBC: Recent Labs  Lab 05/08/19 0928 05/09/19 0501 05/12/19 0632 05/13/19 0630  WBC 5.8 4.7 7.7 7.2  NEUTROABS  --   --  5.2 4.5  HGB 12.2* 11.8* 11.6* 10.6*  HCT 39.0 37.1* 36.3* 34.3*  MCV 95.1 92.8 93.1 94.5  PLT 168 158 203 181   Cardiac Enzymes: No results for input(s): CKTOTAL, CKMB, CKMBINDEX, TROPONINI in the last 168 hours. BNP: Invalid input(s): POCBNP CBG: Recent Labs  Lab 05/12/19 1115 05/12/19 1606 05/12/19 2042 05/13/19 0719 05/13/19 1131  GLUCAP 117* 111* 129* 95 122*   HbA1C: No results for input(s): HGBA1C in the last 72 hours. Urine analysis:    Component Value Date/Time   COLORURINE YELLOW 05/27/2010 2038   APPEARANCEUR CLEAR 05/27/2010 2038   LABSPEC 1.015 05/27/2010 2038   PHURINE 5.0 05/27/2010 2038   GLUCOSEU NEGATIVE 05/27/2010 2038   HGBUR TRACE (A) 05/27/2010 2038   BILIRUBINUR NEGATIVE 05/27/2010 2038   KETONESUR NEGATIVE 05/27/2010 2038   PROTEINUR NEGATIVE 05/27/2010 2038   UROBILINOGEN 0.2 05/27/2010 2038   NITRITE NEGATIVE 05/27/2010 2038   LEUKOCYTESUR NEGATIVE 05/27/2010 2038   Sepsis Labs: @LABRCNTIP (procalcitonin:4,lacticidven:4) ) Recent Results (from the past 240 hour(s))  SARS Coronavirus 2 [facility] order, Performed in [facility] hospital lab) Nasopharyngeal Nasopharyngeal Swab     Status: None   Collection Time: 05/08/19  8:13 AM   Specimen: Nasopharyngeal Swab  Result Value Ref Range Status   SARS Coronavirus 2 NEGATIVE NEGATIVE Final    Comment: (NOTE) If result is NEGATIVE SARS-CoV-2 target nucleic acids are NOT DETECTED. The SARS-CoV-2 RNA is  generally detectable in upper and lower  respiratory specimens during the acute phase of infection. The lowest  concentration of SARS-CoV-2 viral copies this assay can detect is 250  copies / mL. A negative result does not preclude SARS-CoV-2 infection  and should not be used as the sole basis for treatment or other  patient management decisions.  A negative result may occur with  improper specimen collection / handling, submission of specimen other  than nasopharyngeal swab, presence of viral mutation(s) within the  areas targeted by this assay, and inadequate number of viral copies  (<250 copies / mL). A negative result must be combined with clinical  observations, patient history, and epidemiological information. If result is POSITIVE SARS-CoV-2 target nucleic acids are DETECTED. The SARS-CoV-2 RNA is generally detectable in upper and lower  respiratory specimens dur ing the acute phase of infection.  Positive  results are indicative of active infection with SARS-CoV-2.  Clinical  correlation with patient history and other diagnostic information is  necessary to determine patient infection status.  Positive results do  not rule out bacterial infection or co-infection with other viruses. If result is PRESUMPTIVE POSTIVE SARS-CoV-2 nucleic acids MAY BE PRESENT.   A presumptive positive result was obtained on the submitted specimen  and confirmed on repeat  testing.  While 2019 novel coronavirus  (SARS-CoV-2) nucleic acids may be present in the submitted sample  additional confirmatory testing may be necessary for epidemiological  and / or clinical management purposes  to differentiate between  SARS-CoV-2 and other Sarbecovirus currently known to infect humans.  If clinically indicated additional testing with an alternate test  methodology [phone]) is advised. The SARS-CoV-2 RNA is generally  detectable in upper and lower respiratory sp ecimens during the acute  phase of  infection. The expected result is Negative. Fact Sheet for Patients:  StrictlyIdeas.no Fact Sheet for Healthcare Providers: BankingDealers.co.za This test is not yet approved or cleared by the [place] FDA and has been authorized for detection and/or diagnosis of SARS-CoV-2 by FDA under an Emergency Use Authorization (EUA).  This EUA will remain in effect (meaning this test can be used) for the duration of the COVID-19 declaration under Section 564(b)(1) of the Act, 21 U.S.C. section 360bbb-3(b)(1), unless the authorization is terminated or revoked sooner. Performed at [facility], [address] [place], [place] [address]   MRSA PCR Screening     Status: None   Collection Time: 05/08/19  1:51 PM   Specimen: Nasopharyngeal  Result Value Ref Range Status   MRSA by PCR NEGATIVE NEGATIVE Final    Comment:        The GeneXpert MRSA Assay (FDA approved for NASAL specimens only), is one component of a comprehensive MRSA colonization surveillance program. It is not intended to diagnose MRSA infection nor to guide or monitor treatment for MRSA infections. Performed at [facility], [address] [place], [place] [address]   Surgical pcr screen     Status: None   Collection Time: 05/10/19 11:46 PM   Specimen: Nasal Mucosa; Nasal Swab  Result Value Ref Range Status   MRSA, PCR NEGATIVE NEGATIVE Final   Staphylococcus aureus NEGATIVE NEGATIVE Final    Comment: (NOTE) The Xpert SA Assay (FDA approved for NASAL specimens in patients 37 years of age and older), is one component of a comprehensive surveillance program. It is not intended to diagnose infection nor to guide or monitor treatment. Performed at [facility], [address] [place], [place] [address]      Scheduled Meds:  acetaminophen  1,000 mg Oral Q6H   atenolol  25 mg Oral Daily   atorvastatin  10 mg Oral QHS   docusate sodium  100 mg Oral BID   furosemide   40 mg Oral Daily   heparin  5,000 Units Subcutaneous Q8H   insulin aspart  0-9 Units Subcutaneous TID WC   polyvinyl alcohol  1 drop Both Eyes BID   sodium chloride flush  3 mL Intravenous Q12H   vitamin B-12  1,000 mcg Oral Daily   Continuous Infusions:  sodium chloride     lactated ringers 75 mL/hr at 05/11/19 1250    Procedures/Studies: Dg Chest 1 View  Result Date: 05/09/2019 CLINICAL DATA:  Colon mass, chronic shortness of breath EXAM: CHEST  1 VIEW COMPARISON:  05/27/2010 FINDINGS: Mild cardiomegaly. Linear atelectasis or scar at the right base. No consolidation or effusion. No pneumothorax. IMPRESSION: No active disease.  Mildly low lung volume.  Mild cardiomegaly Electronically Signed   By: Donavan Foil M.D.   On: 05/09/2019 18:27   Ct Abdomen Pelvis W Contrast  Result Date: 05/09/2019 CLINICAL DATA:  Cecal mass on colonoscopy.  Anemia EXAM: CT ABDOMEN AND PELVIS WITH CONTRAST TECHNIQUE: Multidetector CT imaging of the abdomen and pelvis was performed using  the standard protocol following bolus administration of intravenous contrast. CONTRAST:  69mL OMNIPAQUE IOHEXOL 300 MG/ML SOLN, 185mL OMNIPAQUE IOHEXOL 300 MG/ML SOLN COMPARISON:  None. FINDINGS: Lower chest: Lung bases are clear.  Small bilateral effusions Hepatobiliary: No focal hepatic lesion. No biliary duct dilatation. Gallbladder is normal. Common bile duct is normal. Pancreas: Pancreas is normal. No ductal dilatation. No pancreatic inflammation. Spleen: Normal spleen Adrenals/urinary tract: Adrenal glands and kidneys normal. Ureters normal. Small diverticulum along the lateral aspect of the LEFT bladder wall. Stomach/Bowel: Stomach duodenum normal. Small bowel normal. Terminal ileum normal. There is a circumferential mass involving the cecum from the level of the terminal ileum to the tip. The mass measures 6.4 x 4.0 x 7.0 cm. No perforation. Appendix is short (image 64/2) No enlarged lymph nodes are evident in the  ileocecal mesentery. No central mesenteric or periaortic adenopathy. No periportal adenopathy The remaining ascending and transverse colon are normal. The descending colon is collapsed. There is fluid within the sigmoid colon and rectum. There is rectal wall thickening without mass lesion (image 82/2) Vascular/Lymphatic: Abdominal aorta is normal caliber with atherosclerotic calcification. There is no retroperitoneal or periportal lymphadenopathy. No pelvic lymphadenopathy. Reproductive: Prostate small Other: No peritoneal disease Musculoskeletal: No aggressive osseous lesion. Extensive degenerative osteophytosis of the spine. IMPRESSION: 1. Circumferential mass involving the cecum from the level of the tip to the ileocecal valve consistent with colorectal carcinoma. 2. No evidence of local nodal metastasis in the ileocecal mesentery. No evidence distant nodal metastasis. 3. No liver metastasis identified. 4. No bowel obstruction. 5. Small bilateral pleural effusions. 6. Fluid within the rectum and rectal wall thickening could represent proctitis. Electronically Signed   By: Suzy Bouchard M.D.   On: 05/09/2019 19:35    Orson Eva, DO  [facility] Pager [phone]  If 7PM-7AM, please contact night-coverage [email or web address] Password [email or web address] 05/13/2019, 1:25 PM   LOS: 4 days

## 2019-05-13 NOTE — Progress Notes (Signed)
2 Days Post-Op  Subjective: Patient has no complaints.  Tolerating diet well.  Objective: Vital signs in last 24 hours: Temp:  [97.7 F (36.5 C)-98.6 F (37 C)] 98.6 F (37 C) (08/16 0506) Pulse Rate:  [84-85] 85 (08/16 0506) Resp:  [17-20] 17 (08/16 0506) BP: (116-127)/(54-64) 116/64 (08/16 0506) SpO2:  [91 %-97 %] 96 % (08/16 0506) Last BM Date: 05/12/19  Intake/Output from previous day: 08/15 0701 - 08/16 0700 In: 2960 [P.O.:2760; IV Piggyback:200] Out: 2352 [Urine:1450; Stool:2] Intake/Output this shift: Total I/O In: 240 [P.O.:240] Out: -   General appearance: alert, cooperative and no distress GI: Soft, incisions healing well.  Bowel sounds active.  Lab Results:  Recent Labs    05/12/19 0632 05/13/19 0630  WBC 7.7 7.2  HGB 11.6* 10.6*  HCT 36.3* 34.3*  PLT 203 181   BMET Recent Labs    05/12/19 0632 05/13/19 0630  NA 139 137  K 3.4* 3.8  CL 104 100  CO2 26 28  GLUCOSE 125* 103*  BUN 9 7*  CREATININE 0.68 0.78  CALCIUM 8.4* 8.2*   PT/INR No results for input(s): LABPROT, INR in the last 72 hours.  Studies/Results: No results found.  Anti-infectives: Anti-infectives (From admission, onward)   Start     Dose/Rate Route Frequency Ordered Stop   05/11/19 2200  cefoTEtan (CEFOTAN) 2 g in sodium chloride 0.9 % 100 mL IVPB     2 g 200 mL/hr over 30 Minutes Intravenous Every 12 hours 05/11/19 1136 05/12/19 2328   05/11/19 0630  cefoTEtan (CEFOTAN) 2 g in sodium chloride 0.9 % 100 mL IVPB     2 g 200 mL/hr over 30 Minutes Intravenous On call to O.R. 05/11/19 0618 05/12/19 1028   05/10/19 2200  neomycin (MYCIFRADIN) tablet 1,000 mg     1,000 mg Oral  Once 05/10/19 0822 05/10/19 2018   05/10/19 2200  metroNIDAZOLE (FLAGYL) tablet 1,000 mg     1,000 mg Oral Once 05/10/19 0822 05/10/19 2017   05/10/19 1500  neomycin (MYCIFRADIN) tablet 1,000 mg     1,000 mg Oral  Once 05/10/19 0822 05/10/19 1556   05/10/19 1500  metroNIDAZOLE (FLAGYL) tablet 1,000 mg      1,000 mg Oral Once 05/10/19 4680 05/10/19 1556   05/10/19 1400  neomycin (MYCIFRADIN) tablet 1,000 mg     1,000 mg Oral  Once 05/10/19 3212 05/10/19 1455   05/10/19 1400  metroNIDAZOLE (FLAGYL) tablet 1,000 mg     1,000 mg Oral Once 05/10/19 2482 05/10/19 1455      Assessment/Plan: s/p Procedure(s): ATTEMPTED LAPAROSCOPIC PARTIAL COLECTOMY PARTIAL COLECTOMY (Laparotomy incision at 0837 Impression: Recovering well from right hemicolectomy.  Final pathology pending.  Awaiting disposition.  LOS: 4 days    Aviva Signs 05/13/2019

## 2019-05-14 ENCOUNTER — Encounter (HOSPITAL_COMMUNITY): Payer: Self-pay | Admitting: General Surgery

## 2019-05-14 LAB — BPAM RBC
Blood Product Expiration Date: 202008292359
Blood Product Expiration Date: 202008292359
Unit Type and Rh: 9500
Unit Type and Rh: 9500

## 2019-05-14 LAB — BASIC METABOLIC PANEL
Anion gap: 9 (ref 5–15)
BUN: 8 mg/dL (ref 8–23)
CO2: 28 mmol/L (ref 22–32)
Calcium: 8.1 mg/dL — ABNORMAL LOW (ref 8.9–10.3)
Chloride: 102 mmol/L (ref 98–111)
Creatinine, Ser: 0.58 mg/dL — ABNORMAL LOW (ref 0.61–1.24)
GFR calc Af Amer: >60 mL/min (ref >60)
GFR calc non Af Amer: >60 mL/min (ref >60)
Glucose, Bld: 98 mg/dL (ref 70–99)
Potassium: 3.2 mmol/L — ABNORMAL LOW (ref 3.5–5.1)
Sodium: 139 mmol/L (ref 135–145)

## 2019-05-14 LAB — TYPE AND SCREEN
ABO/RH(D): O NEG
Antibody Screen: POSITIVE
Donor AG Type: NEGATIVE
Donor AG Type: NEGATIVE
Unit division: 0
Unit division: 0

## 2019-05-14 LAB — CBC WITH DIFFERENTIAL/PLATELET
Abs Immature Granulocytes: 0.09 10*3/uL — ABNORMAL HIGH (ref 0.00–0.07)
Basophils Absolute: 0 10*3/uL (ref 0.0–0.1)
Basophils Relative: 1 %
Eosinophils Absolute: 0.3 10*3/uL (ref 0.0–0.5)
Eosinophils Relative: 6 %
HCT: 30.6 % — ABNORMAL LOW (ref 39.0–52.0)
Hemoglobin: 9.7 g/dL — ABNORMAL LOW (ref 13.0–17.0)
Immature Granulocytes: 2 %
Lymphocytes Relative: 23 %
Lymphs Abs: 1.4 10*3/uL (ref 0.7–4.0)
MCH: 29.7 pg (ref 26.0–34.0)
MCHC: 31.7 g/dL (ref 30.0–36.0)
MCV: 93.6 fL (ref 80.0–100.0)
Monocytes Absolute: 0.6 10*3/uL (ref 0.1–1.0)
Monocytes Relative: 10 %
Neutro Abs: 3.4 10*3/uL (ref 1.7–7.7)
Neutrophils Relative %: 58 %
Platelets: 164 10*3/uL (ref 150–400)
RBC: 3.27 MIL/uL — ABNORMAL LOW (ref 4.22–5.81)
RDW: 13.2 % (ref 11.5–15.5)
WBC: 5.9 10*3/uL (ref 4.0–10.5)
nRBC: 0 % (ref 0.0–0.2)

## 2019-05-14 LAB — GLUCOSE, CAPILLARY
Glucose-Capillary: 104 mg/dL — ABNORMAL HIGH (ref 70–99)
Glucose-Capillary: 117 mg/dL — ABNORMAL HIGH (ref 70–99)
Glucose-Capillary: 106 mg/dL — ABNORMAL HIGH (ref 70–99)

## 2019-05-14 LAB — PHOSPHORUS: Phosphorus: 2.9 mg/dL (ref 2.5–4.6)

## 2019-05-14 LAB — MAGNESIUM: Magnesium: 1.9 mg/dL (ref 1.7–2.4)

## 2019-05-14 MED ORDER — MAGNESIUM HYDROXIDE 400 MG/5ML PO SUSP
30.0000 mL | Freq: Two times a day (BID) | ORAL | Status: DC
  Administered 2019-05-14: 30 mL via ORAL
  Filled 2019-05-14 (×2): qty 30

## 2019-05-14 MED ORDER — POTASSIUM CHLORIDE CRYS ER 20 MEQ PO TBCR
20.0000 meq | EXTENDED_RELEASE_TABLET | Freq: Two times a day (BID) | ORAL | Status: DC
  Administered 2019-05-14 – 2019-05-15 (×3): 20 meq via ORAL
  Filled 2019-05-14 (×3): qty 1

## 2019-05-14 NOTE — Progress Notes (Signed)
ON RECALL  °

## 2019-05-14 NOTE — Progress Notes (Signed)
3 Days Post-Op  Subjective: Patient has no complaints.  Has documented bowel movements.  Tolerating full liquid diet well.  No nausea or vomiting noted.  Objective: Vital signs in last 24 hours: Temp:  [97.5 F (36.4 C)-98.5 F (36.9 C)] 97.7 F (36.5 C) (08/17 0745) Pulse Rate:  [69-74] 72 (08/17 0745) Resp:  [17-19] 19 (08/17 0745) BP: (102-131)/(51-60) 131/60 (08/17 0745) SpO2:  [94 %-99 %] 94 % (08/17 0745) Last BM Date: 05/13/19  Intake/Output from previous day: 08/16 0701 - 08/17 0700 In: 483 [P.O.:480; I.V.:3] Out: 2500 [Urine:2500] Intake/Output this shift: Total I/O In: 480 [P.O.:480] Out: -   General appearance: alert, cooperative and no distress GI: Soft, active bowel sounds appreciated.  Incision healing well.  Lab Results:  Recent Labs    05/13/19 0630 05/14/19 0611  WBC 7.2 5.9  HGB 10.6* 9.7*  HCT 34.3* 30.6*  PLT 181 164   BMET Recent Labs    05/13/19 0630 05/14/19 0611  NA 137 139  K 3.8 3.2*  CL 100 102  CO2 28 28  GLUCOSE 103* 98  BUN 7* 8  CREATININE 0.78 0.58*  CALCIUM 8.2* 8.1*   PT/INR No results for input(s): LABPROT, INR in the last 72 hours.  Studies/Results: No results found.  Anti-infectives: Anti-infectives (From admission, onward)   Start     Dose/Rate Route Frequency Ordered Stop   05/11/19 2200  cefoTEtan (CEFOTAN) 2 g in sodium chloride 0.9 % 100 mL IVPB     2 g 200 mL/hr over 30 Minutes Intravenous Every 12 hours 05/11/19 1136 05/12/19 2328   05/11/19 0630  cefoTEtan (CEFOTAN) 2 g in sodium chloride 0.9 % 100 mL IVPB     2 g 200 mL/hr over 30 Minutes Intravenous On call to O.R. 05/11/19 0618 05/12/19 1028   05/10/19 2200  neomycin (MYCIFRADIN) tablet 1,000 mg     1,000 mg Oral  Once 05/10/19 0822 05/10/19 2018   05/10/19 2200  metroNIDAZOLE (FLAGYL) tablet 1,000 mg     1,000 mg Oral Once 05/10/19 0822 05/10/19 2017   05/10/19 1500  neomycin (MYCIFRADIN) tablet 1,000 mg     1,000 mg Oral  Once 05/10/19 0822  05/10/19 1556   05/10/19 1500  metroNIDAZOLE (FLAGYL) tablet 1,000 mg     1,000 mg Oral Once 05/10/19 3545 05/10/19 1556   05/10/19 1400  neomycin (MYCIFRADIN) tablet 1,000 mg     1,000 mg Oral  Once 05/10/19 6256 05/10/19 1455   05/10/19 1400  metroNIDAZOLE (FLAGYL) tablet 1,000 mg     1,000 mg Oral Once 05/10/19 3893 05/10/19 1455      Assessment/Plan: s/p Procedure(s): ATTEMPTED LAPAROSCOPIC PARTIAL COLECTOMY PARTIAL COLECTOMY (Laparotomy incision at 0837 Impression: Continues to recover well from surgery.  Bowel function has returned.  Will advance to heart healthy diet.  Final pathology pending.  May discharge back to skilled nursing should he tolerate regular diet well.  LOS: 5 days    Aviva Signs 05/14/2019

## 2019-05-14 NOTE — TOC Progression Note (Signed)
Transition of Care [facility]) - Progression Note    Patient Details  Name: [name] MRN: [id number] Date of Birth: 19-Mar-1946  [facility] CM/SW Contact  Shade Flood, LCSW Phone Number: 05/14/2019, 12:15 PM  Clinical Narrative:     TOC following. Pt status discussed with MD in Progression. Per MD, pt will likely be stable for dc back to [facility] tomorrow. Left HIPPA compliant Voicemail message for Debbie at [facility] to update. Attempted to reach pt's guardian to update as well though unable to reach her. Will attempt later today and again tomorrow. Will follow.  Expected Discharge Plan: Skilled Nursing Facility Barriers to Discharge: Other (comment)(Pending surgery)  Expected Discharge Plan and Services Expected Discharge Plan: [place] In-house Referral: Clinical Social Work                                             Social Determinants of Health (SDOH) Interventions    Readmission Risk Interventions No flowsheet data found.

## 2019-05-14 NOTE — Progress Notes (Signed)
PROGRESS NOTE  [name] LJQ:[id number] DOB: 26-Jul-1946 DOA: 05/08/2019 PCP: Caprice Renshaw, MD   Brief History: 73 y.o.year old malewho resides in a nursing home([name]), presenting today for inpatient admission to help facilitate bowel prep.Pt was fount to beHeme positive last yearwith known IDA.He was admitted in Aug 2019with profound anemia without overt GI bleeding. Received 2 units PRBCs at that time. EGD Aug 2019 during inpatient admission with multiple 3-6 mm gastric nodules and gastritis, surrounding telangectasias. Biopsy showed atrophic gastritis. He is followed by hematology and has been receiving IV iron. He was seen in GI office in June 2020 and they have arranged for patient to have colonoscopy on 05/09/19.Colonoscopy revealed a cecal mass the pathology of which showed adenocarcinoma. General surgery was consulted and plans partial colectomy on 05/11/19.  On 05/11/19, he underwent Laparoscopic converted to open right hemicolectomy.  Post-op, his diet was gradually advanced over several days depending on return of his bowel function.  Assessment/Plan: CecalAdenocarcinoma -05/09/19 colonoscopy--fungating cecal mass -pathology--adenocarcinoma -general surgery consultappreciated -05/09/19 CT abd--no LN mets; circumferential mass involving cecum from tip to ileocecal valve -05/09/19 Echo--EF 65-70%, no WMA -CEA 6.1 -05/11/19--Laparoscopic converted to open right hemicolectomy -post op pain controlled -8/17--advance to heart healthy diet  Iron Deficiency Anemia -pt had been receiving IV iron at [facility]--two infusions weekly -12/06/18 ferritin = 20  Hypertension -continue atenolol  Hyperlipidemia -continue atorvastatin  Diabetes Mellitus type 2 -holding metformin -05/08/19 A1C--5.5 -CBGs controlled  Hypokalemia/hypophosphatemia -repleted -mag 1.8  Stage 1 sacral pressure injury -local wound care  Scrotal ulcer -not infected on  exam -suspect moisture induced injury -consult wound care-->three times daily cleansing of affected area with house incontinence cleanser as it is pH balanced and moisturizing. This will be topped with a moisture barrier ointment to protect from surface irritants and diminish friction injuries     Disposition Plan: SNF 8/18 if stable Family Communication: No Family at bedside  Consultants:general surgery  Code Status: FULL   DVT Prophylaxis: [place] Heparin    Procedures: As Listed in Progress Note Above  Antibiotics: Peri-op cefotetan     Subjective: Patient denies fevers, chills, headache, chest pain, dyspnea, nausea, vomiting, diarrhea, abdominal pain, dysuria, hematuria, hematochezia, and melena.   Objective: Vitals:   05/13/19 2155 05/14/19 0636 05/14/19 0745 05/14/19 1334  BP: (!) 117/51 (!) 128/59 131/60 (!) 115/55  Pulse: 73 69 72 77  Resp: 17 18 19 19   Temp: (!) 97.5 F (36.4 C) 98.2 F (36.8 C) 97.7 F (36.5 C) 98 F (36.7 C)  TempSrc: Oral Oral Oral   SpO2: 99% 97% 94% 96%  Weight:      Height:        Intake/Output Summary (Last 24 hours) at 05/14/2019 1618 Last data filed at 05/14/2019 1300 Gross per 24 hour  Intake 963 ml  Output 500 ml  Net 463 ml   Weight change:  Exam:   General:  Pt is alert, follows commands appropriately, not in acute distress  HEENT: No icterus, No thrush, No neck mass, [place]/AT  Cardiovascular: RRR, S1/S2, no rubs, no gallops  Respiratory: bibasilar rales. No wheeze  Abdomen: Soft/+BS, non tender, non distended, no guarding  Extremities: trace LE edema, No lymphangitis, No petechiae, No rashes, no synovitis   Data Reviewed: I have personally reviewed following labs and imaging studies Basic Metabolic Panel: Recent Labs  Lab 05/09/19 0501 05/10/19 0439 05/11/19 0629 05/12/19 2197 05/13/19 0630 05/14/19 0611  NA  141 138 138 139 137 139  K 3.4* 3.3* 3.6 3.4* 3.8 3.2*  CL 106 104 102 104 100  102  CO2 26 26 27 26 28 28   GLUCOSE 109* 107* 119* 125* 103* 98  BUN 12 8 8 9  7* 8  CREATININE 0.60* 0.63 0.71 0.68 0.78 0.58*  CALCIUM 8.6* 8.2* 8.3* 8.4* 8.2* 8.1*  MG 1.9  --  1.8 1.8 2.0 1.9  PHOS  --   --   --  2.8 2.1* 2.9   Liver Function Tests: No results for input(s): AST, ALT, ALKPHOS, BILITOT, PROT, ALBUMIN in the last 168 hours. No results for input(s): LIPASE, AMYLASE in the last 168 hours. No results for input(s): AMMONIA in the last 168 hours. Coagulation Profile: No results for input(s): INR, PROTIME in the last 168 hours. CBC: Recent Labs  Lab 05/08/19 0928 05/09/19 0501 05/12/19 0632 05/13/19 0630 05/14/19 0611  WBC 5.8 4.7 7.7 7.2 5.9  NEUTROABS  --   --  5.2 4.5 3.4  HGB 12.2* 11.8* 11.6* 10.6* 9.7*  HCT 39.0 37.1* 36.3* 34.3* 30.6*  MCV 95.1 92.8 93.1 94.5 93.6  PLT 168 158 203 181 164   Cardiac Enzymes: No results for input(s): CKTOTAL, CKMB, CKMBINDEX, TROPONINI in the last 168 hours. BNP: Invalid input(s): POCBNP CBG: Recent Labs  Lab 05/13/19 1131 05/13/19 1613 05/13/19 2156 05/14/19 0739 05/14/19 1130  GLUCAP 122* 104* 106* 104* 117*   HbA1C: No results for input(s): HGBA1C in the last 72 hours. Urine analysis:    Component Value Date/Time   COLORURINE YELLOW 05/27/2010 2038   APPEARANCEUR CLEAR 05/27/2010 2038   LABSPEC 1.015 05/27/2010 2038   PHURINE 5.0 05/27/2010 2038   GLUCOSEU NEGATIVE 05/27/2010 2038   HGBUR TRACE (A) 05/27/2010 2038   BILIRUBINUR NEGATIVE 05/27/2010 2038   KETONESUR NEGATIVE 05/27/2010 2038   PROTEINUR NEGATIVE 05/27/2010 2038   UROBILINOGEN 0.2 05/27/2010 2038   NITRITE NEGATIVE 05/27/2010 2038   LEUKOCYTESUR NEGATIVE 05/27/2010 2038   Sepsis Labs: @LABRCNTIP (procalcitonin:4,lacticidven:4) ) Recent Results (from the past 240 hour(s))  SARS Coronavirus 2 [facility] order, Performed in [facility] hospital lab) Nasopharyngeal Nasopharyngeal Swab     Status: None   Collection Time: 05/08/19  8:13 AM    Specimen: Nasopharyngeal Swab  Result Value Ref Range Status   SARS Coronavirus 2 NEGATIVE NEGATIVE Final    Comment: (NOTE) If result is NEGATIVE SARS-CoV-2 target nucleic acids are NOT DETECTED. The SARS-CoV-2 RNA is generally detectable in upper and lower  respiratory specimens during the acute phase of infection. The lowest  concentration of SARS-CoV-2 viral copies this assay can detect is 250  copies / mL. A negative result does not preclude SARS-CoV-2 infection  and should not be used as the sole basis for treatment or other  patient management decisions.  A negative result may occur with  improper specimen collection / handling, submission of specimen other  than nasopharyngeal swab, presence of viral mutation(s) within the  areas targeted by this assay, and inadequate number of viral copies  (<250 copies / mL). A negative result must be combined with clinical  observations, patient history, and epidemiological information. If result is POSITIVE SARS-CoV-2 target nucleic acids are DETECTED. The SARS-CoV-2 RNA is generally detectable in upper and lower  respiratory specimens dur ing the acute phase of infection.  Positive  results are indicative of active infection with SARS-CoV-2.  Clinical  correlation with patient history and other diagnostic information is  necessary to determine patient infection status.  Positive results do  not rule out bacterial infection or co-infection with other viruses. If result is PRESUMPTIVE POSTIVE SARS-CoV-2 nucleic acids MAY BE PRESENT.   A presumptive positive result was obtained on the submitted specimen  and confirmed on repeat testing.  While 2019 novel coronavirus  (SARS-CoV-2) nucleic acids may be present in the submitted sample  additional confirmatory testing may be necessary for epidemiological  and / or clinical management purposes  to differentiate between  SARS-CoV-2 and other Sarbecovirus currently known to infect humans.  If  clinically indicated additional testing with an alternate test  methodology [phone]) is advised. The SARS-CoV-2 RNA is generally  detectable in upper and lower respiratory sp ecimens during the acute  phase of infection. The expected result is Negative. Fact Sheet for Patients:  StrictlyIdeas.no Fact Sheet for Healthcare Providers: BankingDealers.co.za This test is not yet approved or cleared by the [place] FDA and has been authorized for detection and/or diagnosis of SARS-CoV-2 by FDA under an Emergency Use Authorization (EUA).  This EUA will remain in effect (meaning this test can be used) for the duration of the COVID-19 declaration under Section 564(b)(1) of the Act, 21 U.S.C. section 360bbb-3(b)(1), unless the authorization is terminated or revoked sooner. Performed at [facility], [address], [place], [place] [address]   MRSA PCR Screening     Status: None   Collection Time: 05/08/19  1:51 PM   Specimen: Nasopharyngeal  Result Value Ref Range Status   MRSA by PCR NEGATIVE NEGATIVE Final    Comment:        The GeneXpert MRSA Assay (FDA approved for NASAL specimens only), is one component of a comprehensive MRSA colonization surveillance program. It is not intended to diagnose MRSA infection nor to guide or monitor treatment for MRSA infections. Performed at [facility], [address] [place], [place] [address]   Surgical pcr screen     Status: None   Collection Time: 05/10/19 11:46 PM   Specimen: Nasal Mucosa; Nasal Swab  Result Value Ref Range Status   MRSA, PCR NEGATIVE NEGATIVE Final   Staphylococcus aureus NEGATIVE NEGATIVE Final    Comment: (NOTE) The Xpert SA Assay (FDA approved for NASAL specimens in patients 52 years of age and older), is one component of a comprehensive surveillance program. It is not intended to diagnose infection nor to guide or monitor treatment. Performed at [facility],  [address] [place], [place] [address]      Scheduled Meds:  acetaminophen  1,000 mg Oral Q6H   atenolol  25 mg Oral Daily   atorvastatin  10 mg Oral QHS   docusate sodium  100 mg Oral BID   furosemide  40 mg Oral Daily   heparin  5,000 Units Subcutaneous Q8H   magnesium hydroxide  30 mL Oral q12n4p   phosphorus  500 mg Oral BID WC   polyvinyl alcohol  1 drop Both Eyes BID   potassium chloride  20 mEq Oral BID   sodium chloride flush  3 mL Intravenous Q12H   vitamin B-12  1,000 mcg Oral Daily   Continuous Infusions:  sodium chloride      Procedures/Studies: Dg Chest 1 View  Result Date: 05/09/2019 CLINICAL DATA:  Colon mass, chronic shortness of breath EXAM: CHEST  1 VIEW COMPARISON:  05/27/2010 FINDINGS: Mild cardiomegaly. Linear atelectasis or scar at the right base. No consolidation or effusion. No pneumothorax. IMPRESSION: No active disease.  Mildly low lung volume.  Mild cardiomegaly Electronically Signed  By: Donavan Foil M.D.   On: 05/09/2019 18:27   Ct Abdomen Pelvis W Contrast  Result Date: 05/09/2019 CLINICAL DATA:  Cecal mass on colonoscopy.  Anemia EXAM: CT ABDOMEN AND PELVIS WITH CONTRAST TECHNIQUE: Multidetector CT imaging of the abdomen and pelvis was performed using the standard protocol following bolus administration of intravenous contrast. CONTRAST:  69mL OMNIPAQUE IOHEXOL 300 MG/ML SOLN, 129mL OMNIPAQUE IOHEXOL 300 MG/ML SOLN COMPARISON:  None. FINDINGS: Lower chest: Lung bases are clear.  Small bilateral effusions Hepatobiliary: No focal hepatic lesion. No biliary duct dilatation. Gallbladder is normal. Common bile duct is normal. Pancreas: Pancreas is normal. No ductal dilatation. No pancreatic inflammation. Spleen: Normal spleen Adrenals/urinary tract: Adrenal glands and kidneys normal. Ureters normal. Small diverticulum along the lateral aspect of the LEFT bladder wall. Stomach/Bowel: Stomach duodenum normal. Small bowel normal. Terminal ileum normal.  There is a circumferential mass involving the cecum from the level of the terminal ileum to the tip. The mass measures 6.4 x 4.0 x 7.0 cm. No perforation. Appendix is short (image 64/2) No enlarged lymph nodes are evident in the ileocecal mesentery. No central mesenteric or periaortic adenopathy. No periportal adenopathy The remaining ascending and transverse colon are normal. The descending colon is collapsed. There is fluid within the sigmoid colon and rectum. There is rectal wall thickening without mass lesion (image 82/2) Vascular/Lymphatic: Abdominal aorta is normal caliber with atherosclerotic calcification. There is no retroperitoneal or periportal lymphadenopathy. No pelvic lymphadenopathy. Reproductive: Prostate small Other: No peritoneal disease Musculoskeletal: No aggressive osseous lesion. Extensive degenerative osteophytosis of the spine. IMPRESSION: 1. Circumferential mass involving the cecum from the level of the tip to the ileocecal valve consistent with colorectal carcinoma. 2. No evidence of local nodal metastasis in the ileocecal mesentery. No evidence distant nodal metastasis. 3. No liver metastasis identified. 4. No bowel obstruction. 5. Small bilateral pleural effusions. 6. Fluid within the rectum and rectal wall thickening could represent proctitis. Electronically Signed   By: Suzy Bouchard M.D.   On: 05/09/2019 19:35    Orson Eva, DO  [facility] Pager [phone]  If 7PM-7AM, please contact night-coverage [email or web address] Password [email or web address] 05/14/2019, 4:18 PM   LOS: 5 days

## 2019-05-14 NOTE — NC FL2 (Signed)
[place] LEVEL OF CARE SCREENING TOOL     IDENTIFICATION  Patient Name: [name] Birthdate: 1946/01/14 Sex: male Admission Date (Current Location): 05/08/2019  [facility] and [place] Number:  [employer] and Address:  [place] [address], [place]      Provider Number: [id number]  Attending Physician Name and Address:  Orson Eva, MD  Relative Name and Phone Number:  Marinda Elk - state Guardian [phone]    Current Level of Care: Hospital Recommended Level of Care: [place] Prior Approval Number:    Date Approved/Denied:   PASRR Number:    Discharge Plan: SNF    Current Diagnoses: Patient Active Problem List   Diagnosis Date Noted  . Cecal cancer ([place])   . Colonic mass 05/09/2019  . 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 Abdominal pain 05/08/2019  . Dementia   . SOB (shortness of breath) on exertion   . Hypotension   . Acute blood loss anemia 05/01/2018  . Heme positive stool 05/01/2018  . Guaiac positive stools   . Iron deficiency anemia due to chronic blood loss   . GIB (gastrointestinal bleeding) 04/30/2018  . History of Bleeding ulcer   . Hypertension   . Encounter for screening colonoscopy 10/08/2011  . CLOSED DISLOCATION OF FOOT UNSPECIFIED PART 01/15/2010    Orientation RESPIRATION BLADDER Height & Weight     Self, Place  Normal Incontinent Weight: 260 lb 5.8 oz (118.1 kg) Height:  5\' 7"  (170.2 cm)  BEHAVIORAL SYMPTOMS/MOOD NEUROLOGICAL BOWEL NUTRITION STATUS      Continent Diet(see dc summary)  AMBULATORY STATUS COMMUNICATION OF NEEDS Skin   Limited Assist Verbally Surgical wounds, PU Stage and Appropriate Care, Skin abrasions(Stage I sacrum (foam dressing change PRN), Stage II Scrotum, Foam dressing change PRN), abdominal surgical wound, Honeycomb dressing)   PU Stage 2 Dressing: (PRN)                   Personal Care Assistance Level of Assistance  Bathing, Feeding,  Dressing Bathing Assistance: Limited assistance Feeding assistance: Independent Dressing Assistance: Limited assistance     Functional Limitations Info  Sight, Hearing, Speech Sight Info: Adequate Hearing Info: Adequate Speech Info: Adequate    SPECIAL CARE FACTORS FREQUENCY                       Contractures Contractures Info: Not present    Additional Factors Info  Code Status, Allergies Code Status Info: Full Allergies Info: Hydrocodone           Current Medications (05/14/2019):  This is the current hospital active medication list Current Facility-Administered Medications  Medication Dose Route Frequency Provider Last Rate Last Dose  . 0.9 %  sodium chloride infusion  250 mL Intravenous PRN [name], MD      . acetaminophen (TYLENOL) tablet 1,000 mg  1,000 mg Oral Q6H [name], MD   1,000 mg at 05/14/19 0507  . atenolol (TENORMIN) tablet 25 mg  25 mg Oral Daily [name], MD   25 mg at 05/14/19 0950  . atorvastatin (LIPITOR) tablet 10 mg  10 mg Oral QHS [name], MD   10 mg at 05/13/19 2102  . docusate sodium (COLACE) capsule 100 mg  100 mg Oral BID [name], MD   100 mg at 05/14/19 0949  . furosemide (LASIX) tablet 40 mg  40 mg Oral Daily [name], MD  40 mg at 05/14/19 0950  . heparin injection 5,000 Units  5,000 Units Subcutaneous Q8H [name], MD   5,000 Units at 05/14/19 0507  . ipratropium-albuterol (DUONEB) 0.5-2.5 (3) MG/3ML nebulizer solution 3 mL  3 mL Nebulization Q8H PRN [name], MD      . lactated ringers infusion   Intravenous Continuous [name], MD 75 mL/hr at 05/11/19 1250    . magnesium hydroxide (MILK OF MAGNESIA) suspension 30 mL  30 mL Oral q12n4p Aviva Signs, MD      . morphine 2 MG/ML injection 2 mg  2 mg Intravenous Q3H PRN [name], MD      . ondansetron [facility]) tablet 4 mg  4 mg Oral Q6H PRN [name], MD       Or  . ondansetron  [facility]) injection 4 mg  4 mg Intravenous Q6H PRN [name], MD      . oxyCODONE (Oxy IR/ROXICODONE) immediate release tablet 5-10 mg  5-10 mg Oral Q4H PRN [name], MD      . phosphorus (K PHOS NEUTRAL) tablet 500 mg  500 mg Oral BID WC Orson Eva, MD   500 mg at 05/14/19 0948  . polyvinyl alcohol (LIQUIFILM TEARS) 1.4 % ophthalmic solution 1 drop  1 drop Both Eyes BID [name], MD   1 drop at 05/13/19 2103  . potassium chloride SA (K-DUR) CR tablet 20 mEq  20 mEq Oral BID Aviva Signs, MD      . simethicone [facility]) chewable tablet 40 mg  40 mg Oral Q6H PRN [name], MD      . sodium chloride flush (NS) 0.9 % injection 3 mL  3 mL Intravenous Q12H [name], MD   3 mL at 05/13/19 2103  . sodium chloride flush (NS) 0.9 % injection 3 mL  3 mL Intravenous PRN [name], MD      . traZODone (DESYREL) tablet 25 mg  25 mg Oral QHS PRN [name], MD      . vitamin B-12 (CYANOCOBALAMIN) tablet 1,000 mcg  1,000 mcg Oral Daily [name], MD   1,000 mcg at 05/14/19 5537     Discharge Medications: Please see discharge summary for a list of discharge medications.  Relevant Imaging Results:  Relevant Lab Results:   Additional Information SS # [id number]  Shade Flood, LCSW

## 2019-05-15 DIAGNOSIS — R195 Other fecal abnormalities: Secondary | ICD-10-CM | POA: Diagnosis not present

## 2019-05-15 DIAGNOSIS — M7989 Other specified soft tissue disorders: Secondary | ICD-10-CM | POA: Diagnosis not present

## 2019-05-15 DIAGNOSIS — Z9181 History of falling: Secondary | ICD-10-CM | POA: Diagnosis not present

## 2019-05-15 DIAGNOSIS — Z79899 Other long term (current) drug therapy: Secondary | ICD-10-CM | POA: Diagnosis not present

## 2019-05-15 DIAGNOSIS — R404 Transient alteration of awareness: Secondary | ICD-10-CM | POA: Diagnosis not present

## 2019-05-15 DIAGNOSIS — D510 Vitamin B12 deficiency anemia due to intrinsic factor deficiency: Secondary | ICD-10-CM | POA: Diagnosis present

## 2019-05-15 DIAGNOSIS — M5136 Other intervertebral disc degeneration, lumbar region: Secondary | ICD-10-CM | POA: Diagnosis present

## 2019-05-15 DIAGNOSIS — E876 Hypokalemia: Secondary | ICD-10-CM | POA: Diagnosis not present

## 2019-05-15 DIAGNOSIS — E785 Hyperlipidemia, unspecified: Secondary | ICD-10-CM | POA: Diagnosis not present

## 2019-05-15 DIAGNOSIS — C189 Malignant neoplasm of colon, unspecified: Secondary | ICD-10-CM | POA: Diagnosis not present

## 2019-05-15 DIAGNOSIS — I10 Essential (primary) hypertension: Secondary | ICD-10-CM | POA: Diagnosis not present

## 2019-05-15 DIAGNOSIS — F0390 Unspecified dementia without behavioral disturbance: Secondary | ICD-10-CM | POA: Diagnosis not present

## 2019-05-15 DIAGNOSIS — R269 Unspecified abnormalities of gait and mobility: Secondary | ICD-10-CM | POA: Diagnosis not present

## 2019-05-15 DIAGNOSIS — E08621 Diabetes mellitus due to underlying condition with foot ulcer: Secondary | ICD-10-CM | POA: Diagnosis not present

## 2019-05-15 DIAGNOSIS — F79 Unspecified intellectual disabilities: Secondary | ICD-10-CM | POA: Diagnosis present

## 2019-05-15 DIAGNOSIS — Z87891 Personal history of nicotine dependence: Secondary | ICD-10-CM | POA: Diagnosis not present

## 2019-05-15 DIAGNOSIS — R0902 Hypoxemia: Secondary | ICD-10-CM | POA: Diagnosis not present

## 2019-05-15 DIAGNOSIS — R531 Weakness: Secondary | ICD-10-CM | POA: Diagnosis not present

## 2019-05-15 DIAGNOSIS — S30813D Abrasion of scrotum and testes, subsequent encounter: Secondary | ICD-10-CM | POA: Diagnosis not present

## 2019-05-15 DIAGNOSIS — F70 Mild intellectual disabilities: Secondary | ICD-10-CM | POA: Diagnosis present

## 2019-05-15 DIAGNOSIS — Z825 Family history of asthma and other chronic lower respiratory diseases: Secondary | ICD-10-CM | POA: Diagnosis not present

## 2019-05-15 DIAGNOSIS — R293 Abnormal posture: Secondary | ICD-10-CM | POA: Diagnosis present

## 2019-05-15 DIAGNOSIS — C180 Malignant neoplasm of cecum: Secondary | ICD-10-CM | POA: Diagnosis present

## 2019-05-15 DIAGNOSIS — E119 Type 2 diabetes mellitus without complications: Secondary | ICD-10-CM | POA: Diagnosis not present

## 2019-05-15 DIAGNOSIS — I739 Peripheral vascular disease, unspecified: Secondary | ICD-10-CM | POA: Diagnosis present

## 2019-05-15 DIAGNOSIS — R32 Unspecified urinary incontinence: Secondary | ICD-10-CM | POA: Diagnosis not present

## 2019-05-15 DIAGNOSIS — M791 Myalgia, unspecified site: Secondary | ICD-10-CM | POA: Diagnosis not present

## 2019-05-15 DIAGNOSIS — R29898 Other symptoms and signs involving the musculoskeletal system: Secondary | ICD-10-CM | POA: Diagnosis not present

## 2019-05-15 DIAGNOSIS — Z885 Allergy status to narcotic agent status: Secondary | ICD-10-CM | POA: Diagnosis not present

## 2019-05-15 DIAGNOSIS — M6281 Muscle weakness (generalized): Secondary | ICD-10-CM | POA: Diagnosis not present

## 2019-05-15 DIAGNOSIS — D509 Iron deficiency anemia, unspecified: Secondary | ICD-10-CM | POA: Diagnosis not present

## 2019-05-15 DIAGNOSIS — R278 Other lack of coordination: Secondary | ICD-10-CM | POA: Diagnosis not present

## 2019-05-15 DIAGNOSIS — K219 Gastro-esophageal reflux disease without esophagitis: Secondary | ICD-10-CM | POA: Diagnosis present

## 2019-05-15 DIAGNOSIS — M1990 Unspecified osteoarthritis, unspecified site: Secondary | ICD-10-CM | POA: Diagnosis present

## 2019-05-15 DIAGNOSIS — R410 Disorientation, unspecified: Secondary | ICD-10-CM | POA: Diagnosis not present

## 2019-05-15 DIAGNOSIS — R41841 Cognitive communication deficit: Secondary | ICD-10-CM | POA: Diagnosis present

## 2019-05-15 DIAGNOSIS — Z7401 Bed confinement status: Secondary | ICD-10-CM | POA: Diagnosis not present

## 2019-05-15 DIAGNOSIS — M2550 Pain in unspecified joint: Secondary | ICD-10-CM | POA: Diagnosis not present

## 2019-05-15 DIAGNOSIS — M549 Dorsalgia, unspecified: Secondary | ICD-10-CM | POA: Diagnosis not present

## 2019-05-15 DIAGNOSIS — D500 Iron deficiency anemia secondary to blood loss (chronic): Secondary | ICD-10-CM | POA: Diagnosis not present

## 2019-05-15 DIAGNOSIS — E11621 Type 2 diabetes mellitus with foot ulcer: Secondary | ICD-10-CM | POA: Diagnosis present

## 2019-05-15 DIAGNOSIS — H04129 Dry eye syndrome of unspecified lacrimal gland: Secondary | ICD-10-CM | POA: Diagnosis present

## 2019-05-15 DIAGNOSIS — Z809 Family history of malignant neoplasm, unspecified: Secondary | ICD-10-CM | POA: Diagnosis not present

## 2019-05-15 DIAGNOSIS — E669 Obesity, unspecified: Secondary | ICD-10-CM | POA: Diagnosis present

## 2019-05-15 LAB — GLUCOSE, CAPILLARY
Glucose-Capillary: 127 mg/dL — ABNORMAL HIGH (ref 70–99)
Glucose-Capillary: 100 mg/dL — ABNORMAL HIGH (ref 70–99)
Glucose-Capillary: 115 mg/dL — ABNORMAL HIGH (ref 70–99)

## 2019-05-15 LAB — CBC WITH DIFFERENTIAL/PLATELET
Abs Immature Granulocytes: 0.1 10*3/uL — ABNORMAL HIGH (ref 0.00–0.07)
Basophils Absolute: 0 10*3/uL (ref 0.0–0.1)
Basophils Relative: 1 %
Eosinophils Absolute: 0.3 10*3/uL (ref 0.0–0.5)
Eosinophils Relative: 4 %
HCT: 32 % — ABNORMAL LOW (ref 39.0–52.0)
Hemoglobin: 10 g/dL — ABNORMAL LOW (ref 13.0–17.0)
Immature Granulocytes: 1 %
Lymphocytes Relative: 19 %
Lymphs Abs: 1.4 10*3/uL (ref 0.7–4.0)
MCH: 29.5 pg (ref 26.0–34.0)
MCHC: 31.3 g/dL (ref 30.0–36.0)
MCV: 94.4 fL (ref 80.0–100.0)
Monocytes Absolute: 0.5 10*3/uL (ref 0.1–1.0)
Monocytes Relative: 8 %
Neutro Abs: 4.7 10*3/uL (ref 1.7–7.7)
Neutrophils Relative %: 67 %
Platelets: 191 10*3/uL (ref 150–400)
RBC: 3.39 MIL/uL — ABNORMAL LOW (ref 4.22–5.81)
RDW: 13.2 % (ref 11.5–15.5)
WBC: 7.1 10*3/uL (ref 4.0–10.5)
nRBC: 0 % (ref 0.0–0.2)

## 2019-05-15 LAB — BASIC METABOLIC PANEL
Anion gap: 7 (ref 5–15)
BUN: 8 mg/dL (ref 8–23)
CO2: 28 mmol/L (ref 22–32)
Calcium: 8.2 mg/dL — ABNORMAL LOW (ref 8.9–10.3)
Chloride: 104 mmol/L (ref 98–111)
Creatinine, Ser: 0.55 mg/dL — ABNORMAL LOW (ref 0.61–1.24)
GFR calc Af Amer: >60 mL/min (ref >60)
GFR calc non Af Amer: >60 mL/min (ref >60)
Glucose, Bld: 115 mg/dL — ABNORMAL HIGH (ref 70–99)
Potassium: 3.3 mmol/L — ABNORMAL LOW (ref 3.5–5.1)
Sodium: 139 mmol/L (ref 135–145)

## 2019-05-15 LAB — PHOSPHORUS: Phosphorus: 2.9 mg/dL (ref 2.5–4.6)

## 2019-05-15 LAB — MAGNESIUM: Magnesium: 2 mg/dL (ref 1.7–2.4)

## 2019-05-15 NOTE — Progress Notes (Signed)
PATIENT ON RECALL FOR 1 YR TCS

## 2019-05-15 NOTE — Progress Notes (Signed)
Report called and given to [facility] facility. All questions were answered by [place] RN. No further questions  At this time. Patient will be transported via [facility].

## 2019-05-15 NOTE — Progress Notes (Signed)
4 Days Post-Op  Subjective: Patient has no complaints.  Objective: Vital signs in last 24 hours: Temp:  [98 F (36.7 C)-98.6 F (37 C)] 98.6 F (37 C) (08/17 2119) Pulse Rate:  [77-81] 81 (08/17 2119) Resp:  [18-19] 18 (08/17 2119) BP: (115-116)/(55) 116/55 (08/17 2119) SpO2:  [96 %-98 %] 98 % (08/17 2119) Last BM Date: 05/14/19  Intake/Output from previous day: 08/17 0701 - 08/18 0700 In: 1863 [P.O.:960; I.V.:903] Out: 1400 [Urine:1400] Intake/Output this shift: No intake/output data recorded.  General appearance: alert, cooperative and no distress GI: Soft, incisions healing well.  Bowel sounds active.  Lab Results:  Recent Labs    05/14/19 0611 05/15/19 0553  WBC 5.9 7.1  HGB 9.7* 10.0*  HCT 30.6* 32.0*  PLT 164 191   BMET Recent Labs    05/14/19 0611 05/15/19 0553  NA 139 139  K 3.2* 3.3*  CL 102 104  CO2 28 28  GLUCOSE 98 115*  BUN 8 8  CREATININE 0.58* 0.55*  CALCIUM 8.1* 8.2*   PT/INR No results for input(s): LABPROT, INR in the last 72 hours.  Studies/Results: No results found.  Anti-infectives: Anti-infectives (From admission, onward)   Start     Dose/Rate Route Frequency Ordered Stop   05/11/19 2200  cefoTEtan (CEFOTAN) 2 g in sodium chloride 0.9 % 100 mL IVPB     2 g 200 mL/hr over 30 Minutes Intravenous Every 12 hours 05/11/19 1136 05/12/19 2328   05/11/19 0630  cefoTEtan (CEFOTAN) 2 g in sodium chloride 0.9 % 100 mL IVPB     2 g 200 mL/hr over 30 Minutes Intravenous On call to O.R. 05/11/19 0618 05/12/19 1028   05/10/19 2200  neomycin (MYCIFRADIN) tablet 1,000 mg     1,000 mg Oral  Once 05/10/19 0822 05/10/19 2018   05/10/19 2200  metroNIDAZOLE (FLAGYL) tablet 1,000 mg     1,000 mg Oral Once 05/10/19 0822 05/10/19 2017   05/10/19 1500  neomycin (MYCIFRADIN) tablet 1,000 mg     1,000 mg Oral  Once 05/10/19 0822 05/10/19 1556   05/10/19 1500  metroNIDAZOLE (FLAGYL) tablet 1,000 mg     1,000 mg Oral Once 05/10/19 2683 05/10/19 1556   05/10/19 1400  neomycin (MYCIFRADIN) tablet 1,000 mg     1,000 mg Oral  Once 05/10/19 4196 05/10/19 1455   05/10/19 1400  metroNIDAZOLE (FLAGYL) tablet 1,000 mg     1,000 mg Oral Once 05/10/19 2229 05/10/19 1455      Assessment/Plan: s/p Procedure(s): ATTEMPTED LAPAROSCOPIC PARTIAL COLECTOMY PARTIAL COLECTOMY (Laparotomy incision at 0837 Impression: Tolerating regular diet well.  Okay for discharge from surgery standpoint.  We will follow-up in our office in 2 weeks for staple removal.  Final pathology reveals a T3, N1C (18 lymph nodes negative, 1 tumor deposit), M0 adenocarcinoma the colon.  LOS: 6 days    Aviva Signs 05/15/2019

## 2019-05-15 NOTE — Discharge Summary (Signed)
Physician Discharge Summary  [name] DVV:[id number] DOB: [birth year] DOA: 05/08/2019  PCP: Caprice Renshaw, MD  Admit date: 05/08/2019 Discharge date: 05/15/2019  Admitted From: SNF Disposition:   SNF  Recommendations for Outpatient Follow-up:  1. Follow up with PCP in 1-2 weeks 2. Please obtain BMP/CBC in one week 3. Please schedule follow up at [facility] with Dr. Delton Coombes in 2 weeks    Discharge Condition: Stable CODE STATUS: FULL Diet recommendation: Heart Healthy   Brief/Interim Summary: 73 y.o.year old malewho resides in a nursing home([name]), presenting today for inpatient admission to help facilitate bowel prep.Pt was fount to beHeme positive last yearwith known IDA.He was admitted in Aug 2019with profound anemia without overt GI bleeding. Received 2 units PRBCs at that time. EGD Aug 2019 during inpatient admission with multiple 3-6 mm gastric nodules and gastritis, surrounding telangectasias. Biopsy showed atrophic gastritis. He is followed by hematology and has been receiving IV iron. He was seen in GI office in June 2020 and they have arranged for patient to have colonoscopy on 05/09/19.Colonoscopy revealed a cecal mass the pathology of which showed adenocarcinoma. General surgery was consulted and plans partial colectomy on 05/11/19.  On 05/11/19, he underwent Laparoscopic converted to open right hemicolectomy.  Post-op, his diet was gradually advanced over several days depending on return of his bowel function.  He ultimately was advanced to cardiac diet which he tolerated.  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  Negative margins and neg LNs  Iron Deficiency Anemia -pt had been receiving IV iron at  [facility]--two infusions weekly -12/06/18 ferritin = 20  Hypertension -continue atenolol -controlled  Hyperlipidemia -continue atorvastatin  Diabetes Mellitus type 2 -holding metformin during hospitalization -05/08/19 A1C--5.5 -CBGs controlled  Hypokalemia/hypophosphatemia -repleted -mag 1.8  Stage 1 sacral pressure injury -local wound care  Scrotal ulcer -not infected on exam -suspect moisture induced injury -consult wound care-->three times daily cleansing of affected area with house incontinence cleanser as it is pH balanced and moisturizing. This will be topped with a moisture barrier ointment to protect from surface irritants and diminish friction injuries   Discharge Instructions   Allergies as of 05/15/2019      Reactions   Hydrocodone-acetaminophen    unknown      Medication List    TAKE these medications   atenolol 25 MG tablet Commonly known as: TENORMIN Take 1 tablet (25 mg total) by mouth daily.   atorvastatin 10 MG tablet Commonly known as: LIPITOR Take 10 mg by mouth at bedtime.   cholecalciferol 10 MCG (400 UNIT) Tabs tablet Commonly known as: VITAMIN D3 Take 400 Units by mouth daily.   ferrous sulfate 325 (65 FE) MG EC tablet Take 325 mg by mouth 2 (two) times daily.   furosemide 40 MG tablet Commonly known as: LASIX Take 40 mg by mouth daily.   ipratropium-albuterol 0.5-2.5 (3) MG/3ML Soln Commonly known as: DUONEB Take 3 mLs by nebulization every 8 (eight) hours as needed (for shortness of breath).   ketoconazole 2 % cream Commonly known as: NIZORAL Apply 1 application topically 2 (two) times daily as needed for irritation.   magnesium  hydroxide 400 MG/5ML  suspension Commonly known as: MILK OF MAGNESIA Take 30 mLs by mouth daily as needed for mild constipation or moderate constipation. For constipation   metFORMIN 500 MG tablet Commonly known as: GLUCOPHAGE Take 500 mg by mouth at bedtime.   mometasone 0.1 % cream Commonly known as: ELOCON Apply 1 application topically daily as needed (for dry skin).   nystatin powder Commonly known as: MYCOSTATIN/NYSTOP Apply topically 2 (two) times daily. Apply to groin and penis topically every day and evening shift to promote skin healing   nystatin-triamcinolone cream Commonly known as: MYCOLOG II Apply 1 application topically daily as needed (for rash).   potassium chloride SA 20 MEQ tablet Commonly known as: K-DUR Take 20 mEq by mouth 2 (two) times daily.   Refresh Tears 0.5 % Soln Generic drug: carboxymethylcellulose Apply 1 drop to eye 2 (two) times daily.   vitamin B-12 1000 MCG tablet Commonly known as: CYANOCOBALAMIN Take 1,000 mcg by mouth daily.      Follow-up [facility]. Go to.   Specialty: [place] information: [address] [place]       Douglas Cagey, MD On 05/29/2019.   Specialty: General Surgery Why: staple removal  Contact information: [address] [name] [address] [phone]        Caprice Renshaw, MD.   Specialty: Internal Medicine Contact information: [name] [address] [phone]        Derek Jack, MD In 2 weeks.   Specialty: Hematology Why: Cecal Cancer Contact information: [name] [address] [phone]          Allergies  Allergen Reactions   Hydrocodone-Acetaminophen     unknown    Consultations:  General surgery; gastroenterology   Procedures/Studies: Dg Chest 1 View  Result Date: 05/09/2019 CLINICAL DATA:  Colon mass, chronic shortness of  breath EXAM: CHEST  1 VIEW COMPARISON:  05/27/2010 FINDINGS: Mild cardiomegaly. Linear atelectasis or scar at the right base. No consolidation or effusion. No pneumothorax. IMPRESSION: No active disease.  Mildly low lung volume.  Mild cardiomegaly Electronically Signed   By: Donavan Foil M.D.   On: 05/09/2019 18:27   Ct Abdomen Pelvis W Contrast  Result Date: 05/09/2019 CLINICAL DATA:  Cecal mass on colonoscopy.  Anemia EXAM: CT ABDOMEN AND PELVIS WITH CONTRAST TECHNIQUE: Multidetector CT imaging of the abdomen and pelvis was performed using the standard protocol following bolus administration of intravenous contrast. CONTRAST:  86mL OMNIPAQUE IOHEXOL 300 MG/ML SOLN, 146mL OMNIPAQUE IOHEXOL 300 MG/ML SOLN COMPARISON:  None. FINDINGS: Lower chest: Lung bases are clear.  Small bilateral effusions Hepatobiliary: No focal hepatic lesion. No biliary duct dilatation. Gallbladder is normal. Common bile duct is normal. Pancreas: Pancreas is normal. No ductal dilatation. No pancreatic inflammation. Spleen: Normal spleen Adrenals/urinary tract: Adrenal glands and kidneys normal. Ureters normal. Small diverticulum along the lateral aspect of the LEFT bladder wall. Stomach/Bowel: Stomach duodenum normal. Small bowel normal. Terminal ileum normal. There is a circumferential mass involving the cecum from the level of the terminal ileum to the tip. The mass measures 6.4 x 4.0 x 7.0 cm. No perforation. Appendix is short (image 64/2) No enlarged lymph nodes are evident in the ileocecal mesentery. No central mesenteric or periaortic adenopathy. No periportal adenopathy The remaining ascending and transverse colon are normal. The descending colon is collapsed. There is fluid within the sigmoid colon and rectum. There is rectal wall thickening without mass lesion (  image 82/2) Vascular/Lymphatic: Abdominal aorta is normal caliber with atherosclerotic calcification. There is no retroperitoneal or periportal lymphadenopathy. No  pelvic lymphadenopathy. Reproductive: Prostate small Other: No peritoneal disease Musculoskeletal: No aggressive osseous lesion. Extensive degenerative osteophytosis of the spine. IMPRESSION: 1. Circumferential mass involving the cecum from the level of the tip to the ileocecal valve consistent with colorectal carcinoma. 2. No evidence of local nodal metastasis in the ileocecal mesentery. No evidence distant nodal metastasis. 3. No liver metastasis identified. 4. No bowel obstruction. 5. Small bilateral pleural effusions. 6. Fluid within the rectum and rectal wall thickening could represent proctitis. Electronically Signed   By: Suzy Bouchard M.D.   On: 05/09/2019 19:35        Discharge Exam: Vitals:   05/14/19 1334 05/14/19 2119  BP: (!) 115/55 (!) 116/55  Pulse: 77 81  Resp: 19 18  Temp: 98 F (36.7 C) 98.6 F (37 C)  SpO2: 96% 98%   Vitals:   05/14/19 0636 05/14/19 0745 05/14/19 1334 05/14/19 2119  BP: (!) 128/59 131/60 (!) 115/55 (!) 116/55  Pulse: 69 72 77 81  Resp: 18 19 19 18   Temp: 98.2 F (36.8 C) 97.7 F (36.5 C) 98 F (36.7 C) 98.6 F (37 C)  TempSrc: Oral Oral  Oral  SpO2: 97% 94% 96% 98%  Weight:      Height:        General: Pt is alert, awake, not in acute distress Cardiovascular: RRR, S1/S2 +, no rubs, no gallops Respiratory: CTA bilaterally, no wheezing, no rhonchi Abdominal: Soft, NT, ND, bowel sounds + Extremities: no edema, no cyanosis   The results of significant diagnostics from this hospitalization (including imaging, microbiology, ancillary and laboratory) are listed below for reference.    Significant Diagnostic Studies: Dg Chest 1 View  Result Date: 05/09/2019 CLINICAL DATA:  Colon mass, chronic shortness of breath EXAM: CHEST  1 VIEW COMPARISON:  05/27/2010 FINDINGS: Mild cardiomegaly. Linear atelectasis or scar at the right base. No consolidation or effusion. No pneumothorax. IMPRESSION: No active disease.  Mildly low lung volume.  Mild  cardiomegaly Electronically Signed   By: Donavan Foil M.D.   On: 05/09/2019 18:27   Ct Abdomen Pelvis W Contrast  Result Date: 05/09/2019 CLINICAL DATA:  Cecal mass on colonoscopy.  Anemia EXAM: CT ABDOMEN AND PELVIS WITH CONTRAST TECHNIQUE: Multidetector CT imaging of the abdomen and pelvis was performed using the standard protocol following bolus administration of intravenous contrast. CONTRAST:  71mL OMNIPAQUE IOHEXOL 300 MG/ML SOLN, 159mL OMNIPAQUE IOHEXOL 300 MG/ML SOLN COMPARISON:  None. FINDINGS: Lower chest: Lung bases are clear.  Small bilateral effusions Hepatobiliary: No focal hepatic lesion. No biliary duct dilatation. Gallbladder is normal. Common bile duct is normal. Pancreas: Pancreas is normal. No ductal dilatation. No pancreatic inflammation. Spleen: Normal spleen Adrenals/urinary tract: Adrenal glands and kidneys normal. Ureters normal. Small diverticulum along the lateral aspect of the LEFT bladder wall. Stomach/Bowel: Stomach duodenum normal. Small bowel normal. Terminal ileum normal. There is a circumferential mass involving the cecum from the level of the terminal ileum to the tip. The mass measures 6.4 x 4.0 x 7.0 cm. No perforation. Appendix is short (image 64/2) No enlarged lymph nodes are evident in the ileocecal mesentery. No central mesenteric or periaortic adenopathy. No periportal adenopathy The remaining ascending and transverse colon are normal. The descending colon is collapsed. There is fluid within the sigmoid colon and rectum. There is rectal wall thickening without mass lesion (image 82/2) Vascular/Lymphatic: Abdominal aorta is normal caliber with  atherosclerotic calcification. There is no retroperitoneal or periportal lymphadenopathy. No pelvic lymphadenopathy. Reproductive: Prostate small Other: No peritoneal disease Musculoskeletal: No aggressive osseous lesion. Extensive degenerative osteophytosis of the spine. IMPRESSION: 1. Circumferential mass involving the cecum from  the level of the tip to the ileocecal valve consistent with colorectal carcinoma. 2. No evidence of local nodal metastasis in the ileocecal mesentery. No evidence distant nodal metastasis. 3. No liver metastasis identified. 4. No bowel obstruction. 5. Small bilateral pleural effusions. 6. Fluid within the rectum and rectal wall thickening could represent proctitis. Electronically Signed   By: Suzy Bouchard M.D.   On: 05/09/2019 19:35     Microbiology: Recent Results (from the past 240 hour(s))  SARS Coronavirus 2 [facility] order, Performed in [facility] hospital lab) Nasopharyngeal Nasopharyngeal Swab     Status: None   Collection Time: 05/08/19  8:13 AM   Specimen: Nasopharyngeal Swab  Result Value Ref Range Status   SARS Coronavirus 2 NEGATIVE NEGATIVE Final    Comment: (NOTE) If result is NEGATIVE SARS-CoV-2 target nucleic acids are NOT DETECTED. The SARS-CoV-2 RNA is generally detectable in upper and lower  respiratory specimens during the acute phase of infection. The lowest  concentration of SARS-CoV-2 viral copies this assay can detect is 250  copies / mL. A negative result does not preclude SARS-CoV-2 infection  and should not be used as the sole basis for treatment or other  patient management decisions.  A negative result may occur with  improper specimen collection / handling, submission of specimen other  than nasopharyngeal swab, presence of viral mutation(s) within the  areas targeted by this assay, and inadequate number of viral copies  (<250 copies / mL). A negative result must be combined with clinical  observations, patient history, and epidemiological information. If result is POSITIVE SARS-CoV-2 target nucleic acids are DETECTED. The SARS-CoV-2 RNA is generally detectable in upper and lower  respiratory specimens dur ing the acute phase of infection.  Positive  results are indicative of active infection with SARS-CoV-2.  Clinical  correlation with patient  history and other diagnostic information is  necessary to determine patient infection status.  Positive results do  not rule out bacterial infection or co-infection with other viruses. If result is PRESUMPTIVE POSTIVE SARS-CoV-2 nucleic acids MAY BE PRESENT.   A presumptive positive result was obtained on the submitted specimen  and confirmed on repeat testing.  While 2019 novel coronavirus  (SARS-CoV-2) nucleic acids may be present in the submitted sample  additional confirmatory testing may be necessary for epidemiological  and / or clinical management purposes  to differentiate between  SARS-CoV-2 and other Sarbecovirus currently known to infect humans.  If clinically indicated additional testing with an alternate test  methodology [phone]) is advised. The SARS-CoV-2 RNA is generally  detectable in upper and lower respiratory sp ecimens during the acute  phase of infection. The expected result is Negative. Fact Sheet for Patients:  StrictlyIdeas.no Fact Sheet for Healthcare Providers: BankingDealers.co.za This test is not yet approved or cleared by the [place] FDA and has been authorized for detection and/or diagnosis of SARS-CoV-2 by FDA under an Emergency Use Authorization (EUA).  This EUA will remain in effect (meaning this test can be used) for the duration of the COVID-19 declaration under Section 564(b)(1) of the Act, 21 U.S.C. section 360bbb-3(b)(1), unless the authorization is terminated or revoked sooner. Performed at [facility], [address] [place], [place] [address]   MRSA PCR Screening     Status:  None   Collection Time: 05/08/19  1:51 PM   Specimen: Nasopharyngeal  Result Value Ref Range Status   MRSA by PCR NEGATIVE NEGATIVE Final    Comment:        The GeneXpert MRSA Assay (FDA approved for NASAL specimens only), is one component of a comprehensive MRSA colonization surveillance program. It is  not intended to diagnose MRSA infection nor to guide or monitor treatment for MRSA infections. Performed at [facility], [address] [place], [place] [address]   Surgical pcr screen     Status: None   Collection Time: 05/10/19 11:46 PM   Specimen: Nasal Mucosa; Nasal Swab  Result Value Ref Range Status   MRSA, PCR NEGATIVE NEGATIVE Final   Staphylococcus aureus NEGATIVE NEGATIVE Final    Comment: (NOTE) The Xpert SA Assay (FDA approved for NASAL specimens in patients 79 years of age and older), is one component of a comprehensive surveillance program. It is not intended to diagnose infection nor to guide or monitor treatment. Performed at [facility], [address] [place], [place] [address]      Labs: Basic Metabolic Panel: Recent Labs  Lab 05/11/19 [phone] 05/12/19 8889 05/13/19 0630 05/14/19 0611 05/15/19 0553  NA 138 139 137 139 139  K 3.6 3.4* 3.8 3.2* 3.3*  CL 102 104 100 102 104  CO2 27 26 28 28 28   GLUCOSE 119* 125* 103* 98 115*  BUN 8 9 7* 8 8  CREATININE 0.71 0.68 0.78 0.58* 0.55*  CALCIUM 8.3* 8.4* 8.2* 8.1* 8.2*  MG 1.8 1.8 2.0 1.9 2.0  PHOS  --  2.8 2.1* 2.9 2.9   Liver Function Tests: No results for input(s): AST, ALT, ALKPHOS, BILITOT, PROT, ALBUMIN in the last 168 hours. No results for input(s): LIPASE, AMYLASE in the last 168 hours. No results for input(s): AMMONIA in the last 168 hours. CBC: Recent Labs  Lab 05/09/19 0501 05/12/19 0632 05/13/19 0630 05/14/19 0611 05/15/19 0553  WBC 4.7 7.7 7.2 5.9 7.1  NEUTROABS  --  5.2 4.5 3.4 4.7  HGB 11.8* 11.6* 10.6* 9.7* 10.0*  HCT 37.1* 36.3* 34.3* 30.6* 32.0*  MCV 92.8 93.1 94.5 93.6 94.4  PLT 158 203 181 164 191   Cardiac Enzymes: No results for input(s): CKTOTAL, CKMB, CKMBINDEX, TROPONINI in the last 168 hours. BNP: Invalid input(s): POCBNP CBG: Recent Labs  Lab 05/13/19 1613 05/13/19 2156 05/14/19 0739 05/14/19 1130 05/14/19 1620  GLUCAP 104* 106* 104* 117* 106*    Time  coordinating discharge:  36 minutes  Signed:  Orson Eva, DO [facility] Pager: [phone] 05/15/2019, 9:57 AM

## 2019-05-15 NOTE — TOC Transition Note (Signed)
Transition of Care [facility]) - CM/SW Discharge Note   Patient Details  Name: [name] MRN: [id number] Date of Birth: 09/03/1946  [facility] CM/SW Contact:  Shade Flood, LCSW Phone Number: 05/15/2019, 12:53 PM   Clinical Narrative:     Pt stable for dc today per MD. Notified staff at [facility]. DC clinical sent electronically. Spoke with pt's guardian, [name], to update. Faxed dc summary to her at her request. RN updated. She will call report and EMS when pt ready. There are no other TOC needs for dc.  Final next level of care: Skilled Nursing Facility Barriers to Discharge: Barriers Resolved   Patient Goals and CMS Choice Patient states their goals for this hospitalization and ongoing recovery are:: to go back to SNF      Discharge Placement                       Discharge Plan and Services In-house Referral: Clinical Social Work                                   Social Determinants of Health (SDOH) Interventions     Readmission Risk Interventions Readmission Risk Prevention Plan 05/15/2019  Post Dischage Appt Not Complete  Appt Comments Pt returning to SNF. SNF MD will follow up at the facility  Medication Screening Complete  Transportation Screening Complete  Some recent data might be hidden

## 2019-05-15 NOTE — Progress Notes (Signed)
CC'D TO PCP °

## 2019-05-15 NOTE — Care Management Important Message (Signed)
Important Message  Patient Details  Name: [name] MRN: [id number] Date of Birth: 1946/08/05   Medicare Important Message Given:  Yes     Shade Flood, LCSW 05/15/2019, 12:52 PM

## 2019-05-17 DIAGNOSIS — E08621 Diabetes mellitus due to underlying condition with foot ulcer: Secondary | ICD-10-CM | POA: Diagnosis not present

## 2019-05-17 DIAGNOSIS — I10 Essential (primary) hypertension: Secondary | ICD-10-CM | POA: Diagnosis not present

## 2019-05-17 DIAGNOSIS — R278 Other lack of coordination: Secondary | ICD-10-CM | POA: Diagnosis not present

## 2019-05-17 DIAGNOSIS — M6281 Muscle weakness (generalized): Secondary | ICD-10-CM | POA: Diagnosis not present

## 2019-05-17 DIAGNOSIS — C180 Malignant neoplasm of cecum: Secondary | ICD-10-CM | POA: Diagnosis not present

## 2019-05-17 DIAGNOSIS — D509 Iron deficiency anemia, unspecified: Secondary | ICD-10-CM | POA: Diagnosis not present

## 2019-05-17 DIAGNOSIS — E119 Type 2 diabetes mellitus without complications: Secondary | ICD-10-CM | POA: Diagnosis not present

## 2019-05-21 DIAGNOSIS — C180 Malignant neoplasm of cecum: Secondary | ICD-10-CM | POA: Diagnosis not present

## 2019-05-21 DIAGNOSIS — D509 Iron deficiency anemia, unspecified: Secondary | ICD-10-CM | POA: Diagnosis not present

## 2019-05-21 DIAGNOSIS — E876 Hypokalemia: Secondary | ICD-10-CM | POA: Diagnosis not present

## 2019-05-23 DIAGNOSIS — E08621 Diabetes mellitus due to underlying condition with foot ulcer: Secondary | ICD-10-CM | POA: Diagnosis not present

## 2019-05-23 DIAGNOSIS — M6281 Muscle weakness (generalized): Secondary | ICD-10-CM | POA: Diagnosis not present

## 2019-05-23 DIAGNOSIS — R278 Other lack of coordination: Secondary | ICD-10-CM | POA: Diagnosis not present

## 2019-05-23 DIAGNOSIS — S30813D Abrasion of scrotum and testes, subsequent encounter: Secondary | ICD-10-CM | POA: Diagnosis not present

## 2019-05-28 DIAGNOSIS — E785 Hyperlipidemia, unspecified: Secondary | ICD-10-CM | POA: Diagnosis not present

## 2019-05-29 ENCOUNTER — Encounter (HOSPITAL_COMMUNITY): Payer: Self-pay | Admitting: Hematology

## 2019-05-29 ENCOUNTER — Other Ambulatory Visit: Payer: Self-pay

## 2019-05-29 ENCOUNTER — Inpatient Hospital Stay (HOSPITAL_COMMUNITY): Payer: Medicare Other | Attending: Hematology | Admitting: Hematology

## 2019-05-29 ENCOUNTER — Inpatient Hospital Stay (HOSPITAL_COMMUNITY): Payer: Medicare Other

## 2019-05-29 VITALS — BP 113/61 | HR 82 | Temp 97.8°F | Resp 16

## 2019-05-29 DIAGNOSIS — M7989 Other specified soft tissue disorders: Secondary | ICD-10-CM | POA: Insufficient documentation

## 2019-05-29 DIAGNOSIS — R269 Unspecified abnormalities of gait and mobility: Secondary | ICD-10-CM | POA: Insufficient documentation

## 2019-05-29 DIAGNOSIS — Z885 Allergy status to narcotic agent status: Secondary | ICD-10-CM | POA: Diagnosis not present

## 2019-05-29 DIAGNOSIS — Z87891 Personal history of nicotine dependence: Secondary | ICD-10-CM | POA: Diagnosis not present

## 2019-05-29 DIAGNOSIS — Z809 Family history of malignant neoplasm, unspecified: Secondary | ICD-10-CM | POA: Insufficient documentation

## 2019-05-29 DIAGNOSIS — R195 Other fecal abnormalities: Secondary | ICD-10-CM | POA: Insufficient documentation

## 2019-05-29 DIAGNOSIS — M791 Myalgia, unspecified site: Secondary | ICD-10-CM | POA: Diagnosis not present

## 2019-05-29 DIAGNOSIS — C189 Malignant neoplasm of colon, unspecified: Secondary | ICD-10-CM | POA: Diagnosis not present

## 2019-05-29 DIAGNOSIS — E669 Obesity, unspecified: Secondary | ICD-10-CM | POA: Insufficient documentation

## 2019-05-29 DIAGNOSIS — M549 Dorsalgia, unspecified: Secondary | ICD-10-CM | POA: Diagnosis not present

## 2019-05-29 DIAGNOSIS — C180 Malignant neoplasm of cecum: Secondary | ICD-10-CM

## 2019-05-29 DIAGNOSIS — Z79899 Other long term (current) drug therapy: Secondary | ICD-10-CM | POA: Diagnosis not present

## 2019-05-29 DIAGNOSIS — R32 Unspecified urinary incontinence: Secondary | ICD-10-CM | POA: Insufficient documentation

## 2019-05-29 DIAGNOSIS — F0390 Unspecified dementia without behavioral disturbance: Secondary | ICD-10-CM | POA: Diagnosis not present

## 2019-05-29 DIAGNOSIS — Z825 Family history of asthma and other chronic lower respiratory diseases: Secondary | ICD-10-CM | POA: Diagnosis not present

## 2019-05-29 DIAGNOSIS — R410 Disorientation, unspecified: Secondary | ICD-10-CM | POA: Insufficient documentation

## 2019-05-29 DIAGNOSIS — D500 Iron deficiency anemia secondary to blood loss (chronic): Secondary | ICD-10-CM | POA: Diagnosis not present

## 2019-05-29 DIAGNOSIS — M2550 Pain in unspecified joint: Secondary | ICD-10-CM | POA: Insufficient documentation

## 2019-05-29 DIAGNOSIS — R531 Weakness: Secondary | ICD-10-CM | POA: Diagnosis not present

## 2019-05-29 NOTE — Assessment & Plan Note (Signed)
1. Adenocarcinoma of the Colon: pathological stage: T3, N1c , M0 - Known Hx of Iron deficiency anemia - Positive occult blood - Colonoscopy 8/12: Revealed cecal mass with pathology consistent with adenocarcinoma. - 08/12: CT Abd/pelvis--circumferential mass involving cecum from the  tip to ileocecal valve, no evidence of metastasis.  - 05/11/19: Laparoscopic converted to open right hemicolectomy. - Surgical pathology: Invasive moderately differentiated adenocarcinoma, 5.5 cm, involving cecum, ileocecal valve and appendiceal orifice. Focally invades into pericolonic soft tissue, resection margins negative, 0/18 LN negative.  - CEA: 6.1  -Have recommended patient proceed with CT of the chest for completion of staging work-up.  Patient most likely has Stage IIIB: Colon Cancer.  Due to his altered mental status and lack of social support he is an unlikely candidate for chemotherapy.  He may be a potential candidate for capecitabine.  We will discuss this more once his work-up is complete.

## 2019-05-29 NOTE — Progress Notes (Signed)
[place] [place], [place] [address]   CLINIC:  Medical Oncology/Hematology  PCP:  Caprice Renshaw, MD [address] [place] [address] [phone]   REASON FOR VISIT:  Follow-up for IDA, newly diagnosed colon cancer   CURRENT THERAPY: Clinical surveillance   INTERVAL HISTORY:  [name] 73 y.o. male presents today for follow-up.  Patient does have dementia.  His nursing aide accompanies him.  Patient states he is doing well.  He knows he was recently in the hospital but does not understand what for.  He initially presented to the hospital on August 14 for colonoscopy.  Colonoscopy was scheduled because of heme positive stool cards.  During colonoscopy patient was found to have a cecal mass that was consistent with adenocarcinoma.  Patient then underwent right Heema colectomy.  Again, patient does not remember any of these events.  He reports his bowel function has returned to normal.  Patient is nonambulatory.  He is a ward of the state.   REVIEW OF SYSTEMS:  Review of Systems  Constitutional: Negative.   HENT:  Negative.   Eyes: Negative.   Respiratory: Negative.   Cardiovascular: Positive for leg swelling.  Gastrointestinal: Negative.   Genitourinary: Positive for bladder incontinence.   Musculoskeletal: Positive for arthralgias, back pain, gait problem and myalgias.  Skin: Negative.   Neurological: Positive for extremity weakness and gait problem.  Psychiatric/Behavioral: Positive for confusion.     PAST MEDICAL/SURGICAL HISTORY:  Past Medical History:  Diagnosis Date  . Bell's palsy   . Bleeding ulcer    remote past  . Dementia    disoriented to time.does not know medical hx.information obtained from previous charts  . Hyperlipemia   . Hypertension   . Obesity   . SOB (shortness of breath) on exertion    Past Surgical History:  Procedure Laterality Date  . BIOPSY  05/09/2019   Procedure: BIOPSY;  Surgeon: Daneil Dolin, MD;   [facility];  Service: Endoscopy;;  . COLONOSCOPY  10/06/06   internal hemorrhoids and anal papilla/poor prep  . COLONOSCOPY N/A 05/09/2019   Procedure: COLONOSCOPY;  Surgeon: Daneil Dolin, MD;  [facility];  Service: Endoscopy;  Laterality: N/A;  . ESOPHAGOGASTRODUODENOSCOPY N/A 05/01/2018   multiple 3-6 mm gastric nodules and gastritis, surrounding telangectasias. Biopsy with atrophic gastritis.   Marland Kitchen FOOT SURGERY    . KNEE ARTHROSCOPY  06/17/2011   Procedure: ARTHROSCOPY KNEE;  Surgeon: Sanjuana Kava;  Location: [facility];  Service: Orthopedics;  Laterality: Right;  . LAPAROSCOPIC PARTIAL COLECTOMY N/A 05/11/2019   Procedure: ATTEMPTED LAPAROSCOPIC PARTIAL COLECTOMY;  Surgeon: [name], MD;  Location: [facility];  Service: General;  Laterality: N/A;  . PARTIAL COLECTOMY  05/11/2019   Procedure: PARTIAL COLECTOMY (Laparotomy incision at 0539;  Surgeon: [name], MD;  Location: [facility];  Service: General;;  . POLYPECTOMY  05/09/2019   Procedure: POLYPECTOMY;  Surgeon: Daneil Dolin, MD;  [facility];  Service: Endoscopy;;     SOCIAL HISTORY:  Social History   Socioeconomic History  . Marital status: Single    Spouse name: Not on file  . Number of children: Not on file  . Years of education: Not on file  . Highest education level: Not on file  Occupational History  . Not on file  Social Needs  . Financial resource strain: Not on file  . Food insecurity    Worry: Not on file  Inability: Not on file  . Transportation needs    Medical: Not on file    Non-medical: Not on file  Tobacco Use  . Smoking status: Former Smoker    Packs/day: 1.00    Years: 30.00    Pack years: 30.00    Types: Cigarettes  . Smokeless tobacco: Never Used  . Tobacco comment: quit a "long time ago"   Substance and Sexual Activity  . Alcohol use: No  . Drug use: No  . Sexual activity: Never  Lifestyle  . Physical activity    Days per week: Not on file     Minutes per session: Not on file  . Stress: Not on file  Relationships  . Social Herbalist on phone: Not on file    Gets together: Not on file    Attends religious service: Not on file    Active member of club or organization: Not on file    Attends meetings of clubs or organizations: Not on file    Relationship status: Not on file  . Intimate partner violence    Fear of current or ex partner: Not on file    Emotionally abused: Not on file    Physically abused: Not on file    Forced sexual activity: Not on file  Other Topics Concern  . Not on file  Social History Narrative  . Not on file    FAMILY HISTORY:  Family History  Problem Relation Age of Onset  . COPD Mother   . Cancer Brother   . Anesthesia problems Neg Hx   . Hypotension Neg Hx   . Malignant hyperthermia Neg Hx   . Pseudochol deficiency Neg Hx   . Colon cancer Neg Hx     CURRENT MEDICATIONS:  Outpatient Encounter Medications as of 05/29/2019  Medication Sig Note  . atenolol (TENORMIN) 25 MG tablet Take 1 tablet (25 mg total) by mouth daily. 05/08/2019: Not on patient's medication list - called facility and verbally verified with nurse Hassan Rowan that patient is still on 25mg  once a day  . atorvastatin (LIPITOR) 10 MG tablet Take 10 mg by mouth at bedtime.    . carboxymethylcellulose (REFRESH TEARS) 0.5 % SOLN Apply 1 drop to eye 2 (two) times daily.    . ferrous sulfate 325 (65 FE) MG EC tablet Take 325 mg by mouth 2 (two) times daily.   . furosemide (LASIX) 40 MG tablet Take 40 mg by mouth daily.    Marland Kitchen ipratropium-albuterol (DUONEB) 0.5-2.5 (3) MG/3ML SOLN Take 3 mLs by nebulization every 8 (eight) hours as needed (for shortness of breath).   Marland Kitchen ketoconazole (NIZORAL) 2 % cream Apply 1 application topically 2 (two) times daily as needed for irritation.   . magnesium hydroxide (MILK OF MAGNESIA) 400 MG/5ML suspension Take 30 mLs by mouth daily as needed for mild constipation or moderate constipation. For  constipation    . metFORMIN (GLUCOPHAGE) 500 MG tablet Take 500 mg by mouth at bedtime.   . mometasone (ELOCON) 0.1 % cream Apply 1 application topically daily as needed (for dry skin).   . nystatin (MYCOSTATIN/NYSTOP) powder Apply topically 2 (two) times daily. Apply to groin and penis topically every day and evening shift to promote skin healing   . nystatin-triamcinolone (MYCOLOG II) cream Apply 1 application topically daily as needed (for rash).   . potassium chloride SA (K-DUR,KLOR-CON) 20 MEQ tablet Take 20 mEq by mouth 2 (two) times daily.    . vitamin B-12 (  CYANOCOBALAMIN) 1000 MCG tablet Take 1,000 mcg by mouth daily.   . [DISCONTINUED] cholecalciferol (VITAMIN D) 400 units TABS tablet Take 400 Units by mouth daily.    No facility-administered encounter medications on file as of 05/29/2019.     ALLERGIES:  Allergies  Allergen Reactions  . Hydrocodone   . Hydrocodone-Acetaminophen     unknown     PHYSICAL EXAM:  ECOG Performance status: 3  Vitals:   05/29/19 1100  BP: 113/61  Pulse: 82  Resp: 16  Temp: 97.8 F (36.6 C)  SpO2: 97%   Filed Weights    Physical Exam Constitutional:      Appearance: Normal appearance. He is obese.  HENT:     Head: Normocephalic.     Right Ear: External ear normal.     Left Ear: External ear normal.     Nose: Nose normal.     Mouth/Throat:     Mouth: Mucous membranes are moist.     Pharynx: Oropharynx is clear.  Eyes:     Extraocular Movements: Extraocular movements intact.     Conjunctiva/sclera: Conjunctivae normal.  Neck:     Musculoskeletal: Normal range of motion.  Cardiovascular:     Rate and Rhythm: Normal rate and regular rhythm.     Pulses: Normal pulses.     Heart sounds: Normal heart sounds.  Pulmonary:     Effort: Pulmonary effort is normal.     Breath sounds: Normal breath sounds.  Abdominal:     General: Bowel sounds are normal.     Palpations: Abdomen is soft.  Musculoskeletal:     Comments: Decreased BLE  ROM and strength   Skin:    General: Skin is warm.  Neurological:     Mental Status: He is alert. He is disoriented.  Psychiatric:        Mood and Affect: Mood normal.        Behavior: Behavior normal.      LABORATORY DATA:  I have reviewed the labs as listed.  CBC    Component Value Date/Time   WBC 7.1 05/15/2019 0553   RBC 3.39 (L) 05/15/2019 0553   HGB 10.0 (L) 05/15/2019 0553   HCT 32.0 (L) 05/15/2019 0553   PLT 191 05/15/2019 0553   MCV 94.4 05/15/2019 0553   MCH 29.5 05/15/2019 0553   MCHC 31.3 05/15/2019 0553   RDW 13.2 05/15/2019 0553   LYMPHSABS 1.4 05/15/2019 0553   MONOABS 0.5 05/15/2019 0553   EOSABS 0.3 05/15/2019 0553   BASOSABS 0.0 05/15/2019 0553   CMP Latest Ref Rng & Units 05/15/2019 05/14/2019 05/13/2019  Glucose 70 - 99 mg/dL 115(H) 98 103(H)  BUN 8 - 23 mg/dL 8 8 7(L)  Creatinine 0.61 - 1.24 mg/dL 0.55(L) 0.58(L) 0.78  Sodium 135 - 145 mmol/L 139 139 137  Potassium 3.5 - 5.1 mmol/L 3.3(L) 3.2(L) 3.8  Chloride 98 - 111 mmol/L 104 102 100  CO2 22 - 32 mmol/L 28 28 28   Calcium 8.9 - 10.3 mg/dL 8.2(L) 8.1(L) 8.2(L)  Total Protein 6.5 - 8.1 g/dL - - -  Total Bilirubin 0.3 - 1.2 mg/dL - - -  Alkaline Phos 38 - 126 U/L - - -  AST 15 - 41 U/L - - -  ALT 0 - 44 U/L - - -       ASSESSMENT & PLAN:   Cecal cancer (HCC) 1. Adenocarcinoma of the Colon: pathological stage: T3, N1c , M0 - Known Hx of Iron deficiency anemia - Positive  occult blood - Colonoscopy 8/12: Revealed cecal mass with pathology consistent with adenocarcinoma. - 08/12: CT Abd/pelvis--circumferential mass involving cecum from the  tip to ileocecal valve, no evidence of metastasis.  - 05/11/19: Laparoscopic converted to open right hemicolectomy. - Surgical pathology: Invasive moderately differentiated adenocarcinoma, 5.5 cm, involving cecum, ileocecal valve and appendiceal orifice. Focally invades into pericolonic soft tissue, resection margins negative, 0/18 LN negative.  - CEA: 6.1   -Have recommended patient proceed with CT of the chest for completion of staging work-up.  Patient most likely has Stage IIIB: Colon Cancer.  Due to his altered mental status and lack of social support he is an unlikely candidate for chemotherapy.  He may be a potential candidate for capecitabine.  We will discuss this more once his work-up is complete.      Orders placed this encounter:  Orders Placed This Encounter  Procedures  . CT Chest W Contrast  . CBC with Differential  . Comprehensive metabolic panel  . [place], [place] [phone]

## 2019-05-31 ENCOUNTER — Ambulatory Visit (INDEPENDENT_AMBULATORY_CARE_PROVIDER_SITE_OTHER): Payer: Self-pay | Admitting: General Surgery

## 2019-05-31 ENCOUNTER — Other Ambulatory Visit: Payer: Self-pay

## 2019-05-31 ENCOUNTER — Encounter: Payer: Self-pay | Admitting: General Surgery

## 2019-05-31 VITALS — BP 125/74 | HR 90 | Temp 98.7°F | Resp 18 | Ht 69.0 in | Wt 238.0 lb

## 2019-05-31 DIAGNOSIS — C180 Malignant neoplasm of cecum: Secondary | ICD-10-CM

## 2019-05-31 NOTE — Progress Notes (Signed)
[facility] Note   HPI:  73 y.o. Male presents to clinic for post-op follow-up evaluation after laparoscopic converted to open right hemicolectomy for T3N1c colon cancer. He has been doing well per his report and caregiver. He has been eating and drinking and having regular BMs. No pain issues. He does not ambulate or lift.   Pathology: Diagnosis Colon, segmental resection for tumor, right - INVASIVE MODERATELY DIFFERENTIATED ADENOCARCINOMA, 5.5 CM, INVOLVING CECUM, ILEOCECAL VALVE AND APPENDICEAL ORIFICE. SEE NOTE - CARCINOMA FOCALLY INVADES INTO PERICOLONIC SOFT TISSUE - RESECTION MARGINS ARE NEGATIVE FOR CARCINOMA - NEGATIVE FOR LYMPHOVASCULAR OR PERINEURAL INVASION - SEPARATE TUBULAR ADENOMAS WITHOUT HIGH-GRADE DYSPLASIA - EIGHTEEN LYMPH NODES, NEGATIVE FOR CARCINOMA (0/18); ONE TUMOR DEPOSIT - SEE ONCOLOGY TABLE Microscopic Comment COLON AND RECTUM: Resection, Including Transanal Disk Excision of Rectal Neoplasms Procedure: Segmental resection, colon Tumor Site: Cecum Tumor Size: 5.5 cm Macroscopic Tumor Perforation: Not identified Histologic Type: Adenocarcinoma Histologic Grade: G2: Moderately differentiated Tumor Extension: Carcinoma focally invades into pericolonic soft tissue Margins: Negative for carcinoma Treatment Effect: Not applicable Lymphovascular Invasion: Not identified Perineural Invasion: Not identified Tumor Deposits: Present, One tumor deposit Regional Lymph Nodes: Number of Lymph Nodes Involved: 0 Number of Lymph Nodes Examined: 18  Microscopic Comment(continued) Pathologic Stage Classification (pTNM, AJCC 8th Edition): pT3, pN1c Ancillary Studies (MMR / MSI testing): Pending Representative tumor block: 1D and 1E   Review of Systems:  No fever or chills Regular BMs Tolerating diet  All other review of systems: otherwise negative   Vital Signs:  BP 125/74 (BP Location: Left Arm, Patient Position: Sitting, Cuff Size: Normal)    Pulse 90   Temp 98.7 F (37.1 C) (Tympanic)   Resp 18   Ht _0  (1.753 m)   Wt 238 lb (108 kg)   SpO2 94%   BMI 35.15 kg/m    Physical Exam:  Physical Exam Vitals signs reviewed.  Cardiovascular:     Rate and Rhythm: Normal rate.  Pulmonary:     Effort: Pulmonary effort is normal.  Abdominal:     General: There is no distension.     Palpations: Abdomen is soft.     Tenderness: There is no abdominal tenderness.     Hernia: No hernia is present.     Comments: Midline incision and port sites healing staples removed, minor redness at staple sites but no extending redness or concern for infection, no drainage  Skin:    General: Skin is warm and dry.  Neurological:     Mental Status: He is alert. Mental status is at baseline.     Assessment:  73 y.o. yo Male with T3N1c colon cancer s/p resection. He has follow up with oncology 9/17. He is overall doing well. He does not ambulate or lift.   Plan:  - Diet and activity as tolerated, no lifting > 10 lbs for 8 weeks after surgery   - Follow up with Oncology, they will determine need for any chemotherapy, etc, we can place port if needed  - Follow up PRN  - Steri strips in place, can leave in place until peeling off or fall off, ok to shower/ bathe  Curlene Labrum, MD [facility] [address] [place], [place] [id number] [phone] (office)

## 2019-05-31 NOTE — Patient Instructions (Signed)
-   Diet and activity as tolerated, no lifting > 10 lbs for 8 weeks after surgery   - Follow up with Oncology, they will determine need for any chemotherapy, etc, we can place port if needed  - Follow up PRN  - Steri strips in place, can leave in place until peeling off or fall off, ok to shower/ bathe

## 2019-06-08 ENCOUNTER — Other Ambulatory Visit (HOSPITAL_COMMUNITY): Payer: Self-pay

## 2019-06-11 DIAGNOSIS — E876 Hypokalemia: Secondary | ICD-10-CM | POA: Diagnosis not present

## 2019-06-11 DIAGNOSIS — D509 Iron deficiency anemia, unspecified: Secondary | ICD-10-CM | POA: Diagnosis not present

## 2019-06-11 DIAGNOSIS — C180 Malignant neoplasm of cecum: Secondary | ICD-10-CM | POA: Diagnosis not present

## 2019-06-12 ENCOUNTER — Other Ambulatory Visit: Payer: Self-pay

## 2019-06-12 ENCOUNTER — Inpatient Hospital Stay (HOSPITAL_COMMUNITY): Payer: Medicare Other

## 2019-06-12 ENCOUNTER — Ambulatory Visit (HOSPITAL_COMMUNITY)
Admission: RE | Admit: 2019-06-12 | Discharge: 2019-06-12 | Disposition: A | Discharge status: Home / Self Care | Payer: Medicare Other | Source: Ambulatory Visit | Attending: Hematology | Admitting: Hematology

## 2019-06-12 DIAGNOSIS — M2550 Pain in unspecified joint: Secondary | ICD-10-CM | POA: Diagnosis not present

## 2019-06-12 DIAGNOSIS — R32 Unspecified urinary incontinence: Secondary | ICD-10-CM | POA: Diagnosis not present

## 2019-06-12 DIAGNOSIS — M549 Dorsalgia, unspecified: Secondary | ICD-10-CM | POA: Diagnosis not present

## 2019-06-12 DIAGNOSIS — C189 Malignant neoplasm of colon, unspecified: Secondary | ICD-10-CM

## 2019-06-12 DIAGNOSIS — C180 Malignant neoplasm of cecum: Secondary | ICD-10-CM | POA: Diagnosis not present

## 2019-06-12 DIAGNOSIS — M7989 Other specified soft tissue disorders: Secondary | ICD-10-CM | POA: Diagnosis not present

## 2019-06-12 DIAGNOSIS — M791 Myalgia, unspecified site: Secondary | ICD-10-CM | POA: Diagnosis not present

## 2019-06-12 DIAGNOSIS — D500 Iron deficiency anemia secondary to blood loss (chronic): Secondary | ICD-10-CM

## 2019-06-12 LAB — COMPREHENSIVE METABOLIC PANEL
ALT: 22 U/L (ref 0–44)
AST: 26 U/L (ref 15–41)
Albumin: 3.6 g/dL (ref 3.5–5.0)
Alkaline Phosphatase: 94 U/L (ref 38–126)
Anion gap: 9 (ref 5–15)
BUN: 15 mg/dL (ref 8–23)
CO2: 29 mmol/L (ref 22–32)
Calcium: 8.6 mg/dL — ABNORMAL LOW (ref 8.9–10.3)
Chloride: 102 mmol/L (ref 98–111)
Creatinine, Ser: 0.71 mg/dL (ref 0.61–1.24)
GFR calc Af Amer: >60 mL/min (ref >60)
GFR calc non Af Amer: >60 mL/min (ref >60)
Glucose, Bld: 125 mg/dL — ABNORMAL HIGH (ref 70–99)
Potassium: 3.8 mmol/L (ref 3.5–5.1)
Sodium: 140 mmol/L (ref 135–145)
Total Bilirubin: 0.7 mg/dL (ref 0.3–1.2)
Total Protein: 6.7 g/dL (ref 6.5–8.1)

## 2019-06-12 LAB — CBC WITH DIFFERENTIAL/PLATELET
Abs Immature Granulocytes: 0.03 10*3/uL (ref 0.00–0.07)
Basophils Absolute: 0 10*3/uL (ref 0.0–0.1)
Basophils Relative: 1 %
Eosinophils Absolute: 0.2 10*3/uL (ref 0.0–0.5)
Eosinophils Relative: 3 %
HCT: 43.1 % (ref 39.0–52.0)
Hemoglobin: 13.4 g/dL (ref 13.0–17.0)
Immature Granulocytes: 1 %
Lymphocytes Relative: 29 %
Lymphs Abs: 1.4 10*3/uL (ref 0.7–4.0)
MCH: 29 pg (ref 26.0–34.0)
MCHC: 31.1 g/dL (ref 30.0–36.0)
MCV: 93.3 fL (ref 80.0–100.0)
Monocytes Absolute: 0.5 10*3/uL (ref 0.1–1.0)
Monocytes Relative: 10 %
Neutro Abs: 2.7 10*3/uL (ref 1.7–7.7)
Neutrophils Relative %: 56 %
Platelets: 157 10*3/uL (ref 150–400)
RBC: 4.62 MIL/uL (ref 4.22–5.81)
RDW: 13.9 % (ref 11.5–15.5)
WBC: 4.9 10*3/uL (ref 4.0–10.5)
nRBC: 0 % (ref 0.0–0.2)

## 2019-06-12 LAB — FERRITIN: Ferritin: 16 ng/mL — ABNORMAL LOW (ref 24–336)

## 2019-06-12 LAB — IRON AND TIBC
Iron: 25 ug/dL — ABNORMAL LOW (ref 45–182)
Saturation Ratios: 7 % — ABNORMAL LOW (ref 17.9–39.5)
TIBC: 338 ug/dL (ref 250–450)
UIBC: 313 ug/dL

## 2019-06-12 MED ORDER — IOHEXOL 300 MG/ML  SOLN
75.0000 mL | Freq: Once | INTRAMUSCULAR | Status: AC | PRN
  Administered 2019-06-12: 75 mL via INTRAVENOUS

## 2019-06-14 ENCOUNTER — Other Ambulatory Visit: Payer: Self-pay

## 2019-06-14 ENCOUNTER — Ambulatory Visit (HOSPITAL_COMMUNITY): Payer: Medicare Other | Admitting: Hematology

## 2019-06-14 ENCOUNTER — Inpatient Hospital Stay (HOSPITAL_COMMUNITY): Payer: Medicare Other | Admitting: Hematology

## 2019-06-15 ENCOUNTER — Ambulatory Visit (HOSPITAL_COMMUNITY): Payer: Medicare Other | Admitting: Hematology

## 2019-06-19 ENCOUNTER — Other Ambulatory Visit: Payer: Self-pay

## 2019-06-19 ENCOUNTER — Inpatient Hospital Stay (HOSPITAL_BASED_OUTPATIENT_CLINIC_OR_DEPARTMENT_OTHER): Payer: Medicare Other | Admitting: Hematology

## 2019-06-19 ENCOUNTER — Encounter (HOSPITAL_COMMUNITY): Payer: Self-pay | Admitting: Hematology

## 2019-06-19 DIAGNOSIS — C189 Malignant neoplasm of colon, unspecified: Secondary | ICD-10-CM

## 2019-06-19 DIAGNOSIS — D500 Iron deficiency anemia secondary to blood loss (chronic): Secondary | ICD-10-CM | POA: Diagnosis not present

## 2019-06-19 NOTE — Progress Notes (Signed)
Virtual Visit via Telephone Note  I connected with [name] on 06/19/19 at  4:05 PM EDT by telephone and verified that I am speaking with the correct person using two identifiers.   I discussed the limitations, risks, security and privacy concerns of performing an evaluation and management service by telephone and the availability of in person appointments. I also discussed with the patient that there may be a patient responsible charge related to this service. The patient expressed understanding and agreed to proceed.   History of Present Illness: #1 iron deficiency state 2.  Cecal adenocarcinoma, T3 N1 cM0, status post resection on 05/11/2019   Observations/Objective: I called [place] home and talk to his caregiver nurse.  Patient has limited mental capacity to understand medical information.  He is not having any new onset pains.  He is able to ambulate with the help of a wheelchair.  Denies any fevers or infections.  Appetite has been good.  Assessment and Plan:  1.  Stage III (T3 N1 cM0) cecal adenocarcinoma: - Status post resection on 05/11/2019, MSI stable.  1 isolated tumor deposit.  0/18 lymph nodes negative. - Preoperative CEA was 6.1. -CT of the chest did not show any evidence of metastatic disease, done on 06/12/2019. -CT of the abdomen and pelvis on 05/09/2019 showed circumferential mass involving the cecum.  No evidence of liver metastasis.  No evidence of local nodal metastasis. -Based on his performance status, I do not believe he is a candidate for adjuvant chemotherapy. -I have recommended follow-up visit in 3 months with repeat CEA level, LFTs.  2.  Iron deficiency state: -Labs on 06/12/2019 shows ferritin of 16.  Percent saturation is 7.  Hemoglobin is 13.4.  Creatinine is 0.7. - I have recommended iron tablet daily with stool softener.   Follow Up Instructions: RTC 3 months with labs 2 to 3 days prior.   I discussed the assessment and treatment plan with the  patient. The patient was provided an opportunity to ask questions and all were answered. The patient agreed with the plan and demonstrated an understanding of the instructions.   The patient was advised to call back or seek an in-person evaluation if the symptoms worsen or if the condition fails to improve as anticipated.  I provided 22 minutes of non-face-to-face time during this encounter.   Derek Jack, MD

## 2019-07-03 DIAGNOSIS — E559 Vitamin D deficiency, unspecified: Secondary | ICD-10-CM | POA: Diagnosis not present

## 2019-07-03 DIAGNOSIS — Z20828 Contact with and (suspected) exposure to other viral communicable diseases: Secondary | ICD-10-CM | POA: Diagnosis not present

## 2019-07-03 DIAGNOSIS — D509 Iron deficiency anemia, unspecified: Secondary | ICD-10-CM | POA: Diagnosis not present

## 2019-07-03 DIAGNOSIS — E785 Hyperlipidemia, unspecified: Secondary | ICD-10-CM | POA: Diagnosis not present

## 2019-07-06 DIAGNOSIS — Z20828 Contact with and (suspected) exposure to other viral communicable diseases: Secondary | ICD-10-CM | POA: Diagnosis not present

## 2019-07-10 DIAGNOSIS — E782 Mixed hyperlipidemia: Secondary | ICD-10-CM | POA: Diagnosis not present

## 2019-07-10 DIAGNOSIS — E785 Hyperlipidemia, unspecified: Secondary | ICD-10-CM | POA: Diagnosis not present

## 2019-07-11 DIAGNOSIS — Z20828 Contact with and (suspected) exposure to other viral communicable diseases: Secondary | ICD-10-CM | POA: Diagnosis not present

## 2019-07-15 DIAGNOSIS — Z20828 Contact with and (suspected) exposure to other viral communicable diseases: Secondary | ICD-10-CM | POA: Diagnosis not present

## 2019-07-18 NOTE — Progress Notes (Signed)
This encounter was created in error - please disregard.

## 2019-07-23 DIAGNOSIS — Z20828 Contact with and (suspected) exposure to other viral communicable diseases: Secondary | ICD-10-CM | POA: Diagnosis not present

## 2019-07-26 DIAGNOSIS — L219 Seborrheic dermatitis, unspecified: Secondary | ICD-10-CM | POA: Diagnosis not present

## 2019-07-28 DIAGNOSIS — Z20828 Contact with and (suspected) exposure to other viral communicable diseases: Secondary | ICD-10-CM | POA: Diagnosis not present

## 2019-08-02 DIAGNOSIS — E538 Deficiency of other specified B group vitamins: Secondary | ICD-10-CM | POA: Diagnosis not present

## 2019-08-02 DIAGNOSIS — E119 Type 2 diabetes mellitus without complications: Secondary | ICD-10-CM | POA: Diagnosis not present

## 2019-08-02 DIAGNOSIS — C180 Malignant neoplasm of cecum: Secondary | ICD-10-CM | POA: Diagnosis not present

## 2019-08-02 DIAGNOSIS — I10 Essential (primary) hypertension: Secondary | ICD-10-CM | POA: Diagnosis not present

## 2019-08-08 DIAGNOSIS — L84 Corns and callosities: Secondary | ICD-10-CM | POA: Diagnosis not present

## 2019-08-08 DIAGNOSIS — E1151 Type 2 diabetes mellitus with diabetic peripheral angiopathy without gangrene: Secondary | ICD-10-CM | POA: Diagnosis not present

## 2019-08-16 DIAGNOSIS — H6120 Impacted cerumen, unspecified ear: Secondary | ICD-10-CM | POA: Diagnosis not present

## 2019-08-16 DIAGNOSIS — L219 Seborrheic dermatitis, unspecified: Secondary | ICD-10-CM | POA: Diagnosis not present

## 2019-08-16 DIAGNOSIS — M25552 Pain in left hip: Secondary | ICD-10-CM | POA: Diagnosis not present

## 2019-08-16 DIAGNOSIS — M1990 Unspecified osteoarthritis, unspecified site: Secondary | ICD-10-CM | POA: Diagnosis not present

## 2019-08-22 DIAGNOSIS — N184 Chronic kidney disease, stage 4 (severe): Secondary | ICD-10-CM | POA: Diagnosis not present

## 2019-08-22 DIAGNOSIS — E785 Hyperlipidemia, unspecified: Secondary | ICD-10-CM | POA: Diagnosis not present

## 2019-08-27 DIAGNOSIS — E785 Hyperlipidemia, unspecified: Secondary | ICD-10-CM | POA: Diagnosis not present

## 2019-08-27 DIAGNOSIS — L989 Disorder of the skin and subcutaneous tissue, unspecified: Secondary | ICD-10-CM | POA: Diagnosis not present

## 2019-08-27 DIAGNOSIS — L219 Seborrheic dermatitis, unspecified: Secondary | ICD-10-CM | POA: Diagnosis not present

## 2019-08-29 DIAGNOSIS — X32XXXA Exposure to sunlight, initial encounter: Secondary | ICD-10-CM | POA: Diagnosis not present

## 2019-08-29 DIAGNOSIS — L570 Actinic keratosis: Secondary | ICD-10-CM | POA: Diagnosis not present

## 2019-08-30 DIAGNOSIS — L219 Seborrheic dermatitis, unspecified: Secondary | ICD-10-CM | POA: Diagnosis not present

## 2019-08-30 DIAGNOSIS — L989 Disorder of the skin and subcutaneous tissue, unspecified: Secondary | ICD-10-CM | POA: Diagnosis not present

## 2019-08-30 DIAGNOSIS — H6120 Impacted cerumen, unspecified ear: Secondary | ICD-10-CM | POA: Diagnosis not present

## 2019-09-03 DIAGNOSIS — H6120 Impacted cerumen, unspecified ear: Secondary | ICD-10-CM | POA: Diagnosis not present

## 2019-09-03 DIAGNOSIS — L219 Seborrheic dermatitis, unspecified: Secondary | ICD-10-CM | POA: Diagnosis not present

## 2019-09-18 ENCOUNTER — Other Ambulatory Visit (HOSPITAL_COMMUNITY): Payer: Medicare Other

## 2019-09-18 DIAGNOSIS — Z79899 Other long term (current) drug therapy: Secondary | ICD-10-CM | POA: Diagnosis not present

## 2019-09-18 DIAGNOSIS — R7989 Other specified abnormal findings of blood chemistry: Secondary | ICD-10-CM | POA: Diagnosis not present

## 2019-09-18 DIAGNOSIS — R945 Abnormal results of liver function studies: Secondary | ICD-10-CM | POA: Diagnosis not present

## 2019-09-25 ENCOUNTER — Other Ambulatory Visit: Payer: Self-pay

## 2019-09-25 ENCOUNTER — Inpatient Hospital Stay (HOSPITAL_COMMUNITY): Payer: Medicare Other | Attending: Hematology | Admitting: Hematology

## 2019-09-25 DIAGNOSIS — C180 Malignant neoplasm of cecum: Secondary | ICD-10-CM

## 2019-09-25 DIAGNOSIS — E611 Iron deficiency: Secondary | ICD-10-CM

## 2019-09-27 NOTE — Assessment & Plan Note (Signed)
1.  Stage III (T3 N1 cM0) cecal adenocarcinoma: - Status post resection on 05/11/2019, MSI stable.  1 isolated tumor deposit.  0/18 lymph nodes negative. - Preoperative CEA was 6.1. -CT of the chest did not show any evidence of metastatic disease, done on 06/12/2019. -CT of the abdomen and pelvis on 05/09/2019 showed circumferential mass involving the cecum.  No evidence of liver metastasis.  No evidence of local nodal metastasis. -Based on his performance status, I do not believe he is a candidate for adjuvant chemotherapy. -Patient at this time is not allowed out of the nursing facilities for doctors appointments unless critical.  Advise nursing facility of patient's recent diagnosis of cecal adenocarcinoma and he needs close follow-up with CT scans and labs.  The facility will get back to [place] whether or not the patient is allowed to leave.  2.  Iron deficiency state: - Recommended iron tablet daily with stool softener.

## 2019-09-27 NOTE — Progress Notes (Signed)
I connected with [name] on 09/14/19 at  2:00 PM EST by telephone visit and verified that I am speaking with the correct person using two identifiers.   I discussed the limitations, risks, security and privacy concerns of performing an evaluation and management service by telemedicine and the availability of in-person appointments. I also discussed with the patient that there may be a patient responsible charge related to this service. The patient expressed understanding and agreed to proceed.    Chief Complaint:  [name]  HPI: [name] is currently in a skilled nursing facility.  I was able to connect with his nurse and also personally speak to the patient.  He reports overall doing well.  He is confined to a wheelchair.  Nurse denies any obvious signs of bleeding.  Reports that [name] is doing well.  No abdominal pain.  Denies any shortness of breath.  Appetite is stable.   Assessment and plan: 1.  Stage III (T3 N1 cM0) [name]: - Status post resection on 05/11/2019, MSI stable.  1 isolated tumor deposit.  0/18 lymph nodes negative. - Preoperative CEA was 6.1. -CT of the chest did not show any evidence of metastatic disease, done on 06/12/2019. -CT of the abdomen and pelvis on 05/09/2019 showed circumferential mass involving the cecum.  No evidence of liver metastasis.  No evidence of local nodal metastasis. -Based on his performance status, I do not believe he is a candidate for adjuvant chemotherapy. -Patient at this time is not allowed out of the nursing facilities for doctors appointments unless critical.  Advise nursing facility of patient's recent diagnosis of [name] and he needs close follow-up with CT scans and labs.  The facility will get back to [place] whether or not the patient is allowed to leave.  2.  Iron deficiency state: - Recommended iron tablet daily with stool softener.

## 2019-10-01 DIAGNOSIS — Z20828 Contact with and (suspected) exposure to other viral communicable diseases: Secondary | ICD-10-CM | POA: Diagnosis not present

## 2019-10-05 DIAGNOSIS — Z20828 Contact with and (suspected) exposure to other viral communicable diseases: Secondary | ICD-10-CM | POA: Diagnosis not present

## 2019-10-09 DIAGNOSIS — Z20828 Contact with and (suspected) exposure to other viral communicable diseases: Secondary | ICD-10-CM | POA: Diagnosis not present

## 2019-10-14 DIAGNOSIS — Z20822 Contact with and (suspected) exposure to covid-19: Secondary | ICD-10-CM | POA: Diagnosis not present

## 2019-10-17 DIAGNOSIS — Z20828 Contact with and (suspected) exposure to other viral communicable diseases: Secondary | ICD-10-CM | POA: Diagnosis not present

## 2019-10-22 DIAGNOSIS — Z20828 Contact with and (suspected) exposure to other viral communicable diseases: Secondary | ICD-10-CM | POA: Diagnosis not present

## 2019-10-24 DIAGNOSIS — Z20828 Contact with and (suspected) exposure to other viral communicable diseases: Secondary | ICD-10-CM | POA: Diagnosis not present

## 2019-10-28 DIAGNOSIS — Z20828 Contact with and (suspected) exposure to other viral communicable diseases: Secondary | ICD-10-CM | POA: Diagnosis not present

## 2019-10-30 DIAGNOSIS — Z23 Encounter for immunization: Secondary | ICD-10-CM | POA: Diagnosis not present

## 2019-10-31 DIAGNOSIS — Z20828 Contact with and (suspected) exposure to other viral communicable diseases: Secondary | ICD-10-CM | POA: Diagnosis not present

## 2019-11-05 DIAGNOSIS — Z20828 Contact with and (suspected) exposure to other viral communicable diseases: Secondary | ICD-10-CM | POA: Diagnosis not present

## 2019-11-12 DIAGNOSIS — Z20828 Contact with and (suspected) exposure to other viral communicable diseases: Secondary | ICD-10-CM | POA: Diagnosis not present

## 2019-11-19 DIAGNOSIS — Z20828 Contact with and (suspected) exposure to other viral communicable diseases: Secondary | ICD-10-CM | POA: Diagnosis not present

## 2019-11-22 DIAGNOSIS — E1151 Type 2 diabetes mellitus with diabetic peripheral angiopathy without gangrene: Secondary | ICD-10-CM | POA: Diagnosis not present

## 2019-11-22 DIAGNOSIS — L989 Disorder of the skin and subcutaneous tissue, unspecified: Secondary | ICD-10-CM | POA: Diagnosis not present

## 2019-11-22 DIAGNOSIS — E119 Type 2 diabetes mellitus without complications: Secondary | ICD-10-CM | POA: Diagnosis not present

## 2019-11-22 DIAGNOSIS — L84 Corns and callosities: Secondary | ICD-10-CM | POA: Diagnosis not present

## 2019-11-22 DIAGNOSIS — I10 Essential (primary) hypertension: Secondary | ICD-10-CM | POA: Diagnosis not present

## 2019-11-22 DIAGNOSIS — D509 Iron deficiency anemia, unspecified: Secondary | ICD-10-CM | POA: Diagnosis not present

## 2019-11-26 DIAGNOSIS — E559 Vitamin D deficiency, unspecified: Secondary | ICD-10-CM | POA: Diagnosis not present

## 2019-11-26 DIAGNOSIS — D510 Vitamin B12 deficiency anemia due to intrinsic factor deficiency: Secondary | ICD-10-CM | POA: Diagnosis not present

## 2019-11-26 DIAGNOSIS — C180 Malignant neoplasm of cecum: Secondary | ICD-10-CM | POA: Diagnosis not present

## 2019-11-26 DIAGNOSIS — Z79899 Other long term (current) drug therapy: Secondary | ICD-10-CM | POA: Diagnosis not present

## 2019-11-26 DIAGNOSIS — D519 Vitamin B12 deficiency anemia, unspecified: Secondary | ICD-10-CM | POA: Diagnosis not present

## 2019-11-26 DIAGNOSIS — E119 Type 2 diabetes mellitus without complications: Secondary | ICD-10-CM | POA: Diagnosis not present

## 2019-11-26 DIAGNOSIS — D649 Anemia, unspecified: Secondary | ICD-10-CM | POA: Diagnosis not present

## 2019-11-26 DIAGNOSIS — D509 Iron deficiency anemia, unspecified: Secondary | ICD-10-CM | POA: Diagnosis not present

## 2019-11-27 DIAGNOSIS — Z20828 Contact with and (suspected) exposure to other viral communicable diseases: Secondary | ICD-10-CM | POA: Diagnosis not present

## 2019-11-27 DIAGNOSIS — Z23 Encounter for immunization: Secondary | ICD-10-CM | POA: Diagnosis not present

## 2019-11-29 ENCOUNTER — Ambulatory Visit (HOSPITAL_COMMUNITY): Payer: Medicare Other

## 2019-11-29 ENCOUNTER — Inpatient Hospital Stay (HOSPITAL_COMMUNITY): Payer: Medicare Other

## 2019-11-29 DIAGNOSIS — E538 Deficiency of other specified B group vitamins: Secondary | ICD-10-CM | POA: Diagnosis not present

## 2019-11-29 DIAGNOSIS — E119 Type 2 diabetes mellitus without complications: Secondary | ICD-10-CM | POA: Diagnosis not present

## 2019-11-29 DIAGNOSIS — E559 Vitamin D deficiency, unspecified: Secondary | ICD-10-CM | POA: Diagnosis not present

## 2019-12-03 ENCOUNTER — Ambulatory Visit (HOSPITAL_COMMUNITY): Payer: Medicare Other | Admitting: Hematology

## 2019-12-04 DIAGNOSIS — Z20828 Contact with and (suspected) exposure to other viral communicable diseases: Secondary | ICD-10-CM | POA: Diagnosis not present

## 2019-12-11 ENCOUNTER — Ambulatory Visit (HOSPITAL_COMMUNITY)
Admission: RE | Admit: 2019-12-11 | Discharge: 2019-12-11 | Disposition: A | Discharge status: Home / Self Care | Payer: Medicare Other | Source: Ambulatory Visit | Attending: Hematology | Admitting: Hematology

## 2019-12-11 ENCOUNTER — Other Ambulatory Visit: Payer: Self-pay

## 2019-12-11 ENCOUNTER — Inpatient Hospital Stay (HOSPITAL_COMMUNITY): Payer: Medicare Other | Attending: Hematology

## 2019-12-11 DIAGNOSIS — D500 Iron deficiency anemia secondary to blood loss (chronic): Secondary | ICD-10-CM | POA: Diagnosis not present

## 2019-12-11 DIAGNOSIS — C189 Malignant neoplasm of colon, unspecified: Secondary | ICD-10-CM | POA: Insufficient documentation

## 2019-12-11 DIAGNOSIS — C180 Malignant neoplasm of cecum: Secondary | ICD-10-CM

## 2019-12-11 LAB — IRON AND TIBC
Iron: 62 ug/dL (ref 45–182)
Saturation Ratios: 19 % (ref 17.9–39.5)
TIBC: 320 ug/dL (ref 250–450)
UIBC: 258 ug/dL

## 2019-12-11 LAB — CBC WITH DIFFERENTIAL/PLATELET
Abs Immature Granulocytes: 0.02 10*3/uL (ref 0.00–0.07)
Basophils Absolute: 0.1 10*3/uL (ref 0.0–0.1)
Basophils Relative: 1 %
Eosinophils Absolute: 0.2 10*3/uL (ref 0.0–0.5)
Eosinophils Relative: 3 %
HCT: 44.3 % (ref 39.0–52.0)
Hemoglobin: 14.6 g/dL (ref 13.0–17.0)
Immature Granulocytes: 0 %
Lymphocytes Relative: 32 %
Lymphs Abs: 1.7 10*3/uL (ref 0.7–4.0)
MCH: 31.4 pg (ref 26.0–34.0)
MCHC: 33 g/dL (ref 30.0–36.0)
MCV: 95.3 fL (ref 80.0–100.0)
Monocytes Absolute: 0.5 10*3/uL (ref 0.1–1.0)
Monocytes Relative: 10 %
Neutro Abs: 2.8 10*3/uL (ref 1.7–7.7)
Neutrophils Relative %: 54 %
Platelets: 169 10*3/uL (ref 150–400)
RBC: 4.65 MIL/uL (ref 4.22–5.81)
RDW: 13 % (ref 11.5–15.5)
WBC: 5.3 10*3/uL (ref 4.0–10.5)
nRBC: 0 % (ref 0.0–0.2)

## 2019-12-11 LAB — FERRITIN: Ferritin: 41 ng/mL (ref 24–336)

## 2019-12-11 LAB — COMPREHENSIVE METABOLIC PANEL
ALT: 42 U/L (ref 0–44)
AST: 43 U/L — ABNORMAL HIGH (ref 15–41)
Albumin: 4 g/dL (ref 3.5–5.0)
Alkaline Phosphatase: 94 U/L (ref 38–126)
Anion gap: 10 (ref 5–15)
BUN: 12 mg/dL (ref 8–23)
CO2: 26 mmol/L (ref 22–32)
Calcium: 8.9 mg/dL (ref 8.9–10.3)
Chloride: 101 mmol/L (ref 98–111)
Creatinine, Ser: 0.49 mg/dL — ABNORMAL LOW (ref 0.61–1.24)
GFR calc Af Amer: >60 mL/min (ref >60)
GFR calc non Af Amer: >60 mL/min (ref >60)
Glucose, Bld: 123 mg/dL — ABNORMAL HIGH (ref 70–99)
Potassium: 3.8 mmol/L (ref 3.5–5.1)
Sodium: 137 mmol/L (ref 135–145)
Total Bilirubin: 0.8 mg/dL (ref 0.3–1.2)
Total Protein: 7.3 g/dL (ref 6.5–8.1)

## 2019-12-12 LAB — CEA: CEA: 7.3 ng/mL — ABNORMAL HIGH (ref 0.0–4.7)

## 2019-12-17 ENCOUNTER — Ambulatory Visit (HOSPITAL_COMMUNITY): Payer: Medicare Other | Admitting: Hematology

## 2019-12-20 ENCOUNTER — Ambulatory Visit (HOSPITAL_COMMUNITY): Payer: Medicare Other | Admitting: Hematology

## 2019-12-21 DIAGNOSIS — R21 Rash and other nonspecific skin eruption: Secondary | ICD-10-CM | POA: Diagnosis not present

## 2019-12-25 ENCOUNTER — Other Ambulatory Visit: Payer: Self-pay

## 2019-12-25 ENCOUNTER — Ambulatory Visit (HOSPITAL_COMMUNITY)
Admission: RE | Admit: 2019-12-25 | Discharge: 2019-12-25 | Disposition: A | Discharge status: Home / Self Care | Payer: Medicare Other | Source: Ambulatory Visit | Attending: Hematology | Admitting: Hematology

## 2019-12-25 DIAGNOSIS — C180 Malignant neoplasm of cecum: Secondary | ICD-10-CM | POA: Insufficient documentation

## 2019-12-27 ENCOUNTER — Other Ambulatory Visit: Payer: Self-pay

## 2019-12-27 ENCOUNTER — Inpatient Hospital Stay (HOSPITAL_COMMUNITY): Payer: Medicare Other | Attending: Hematology | Admitting: Hematology

## 2019-12-27 DIAGNOSIS — N201 Calculus of ureter: Secondary | ICD-10-CM | POA: Insufficient documentation

## 2019-12-27 DIAGNOSIS — E669 Obesity, unspecified: Secondary | ICD-10-CM | POA: Diagnosis not present

## 2019-12-27 DIAGNOSIS — Z8719 Personal history of other diseases of the digestive system: Secondary | ICD-10-CM | POA: Insufficient documentation

## 2019-12-27 DIAGNOSIS — C189 Malignant neoplasm of colon, unspecified: Secondary | ICD-10-CM | POA: Insufficient documentation

## 2019-12-27 DIAGNOSIS — Z885 Allergy status to narcotic agent status: Secondary | ICD-10-CM | POA: Diagnosis not present

## 2019-12-27 DIAGNOSIS — E611 Iron deficiency: Secondary | ICD-10-CM | POA: Insufficient documentation

## 2019-12-27 DIAGNOSIS — F0390 Unspecified dementia without behavioral disturbance: Secondary | ICD-10-CM | POA: Diagnosis not present

## 2019-12-27 DIAGNOSIS — Z809 Family history of malignant neoplasm, unspecified: Secondary | ICD-10-CM | POA: Diagnosis not present

## 2019-12-27 DIAGNOSIS — M7989 Other specified soft tissue disorders: Secondary | ICD-10-CM | POA: Insufficient documentation

## 2019-12-27 DIAGNOSIS — K760 Fatty (change of) liver, not elsewhere classified: Secondary | ICD-10-CM | POA: Diagnosis not present

## 2019-12-27 DIAGNOSIS — Z836 Family history of other diseases of the respiratory system: Secondary | ICD-10-CM | POA: Insufficient documentation

## 2019-12-27 DIAGNOSIS — C180 Malignant neoplasm of cecum: Secondary | ICD-10-CM

## 2019-12-27 DIAGNOSIS — Z87891 Personal history of nicotine dependence: Secondary | ICD-10-CM | POA: Diagnosis not present

## 2019-12-27 DIAGNOSIS — Z79899 Other long term (current) drug therapy: Secondary | ICD-10-CM | POA: Insufficient documentation

## 2019-12-27 NOTE — Patient Instructions (Signed)
[facility] at [facility] Discharge Instructions  You were seen today by Dr. Katragadda. He went over your recent lab results. He will see you back in 3 months for labs and follow up.   Thank you for choosing [facility] at [facility] to provide your oncology and hematology care.  To afford each patient quality time with our provider, please arrive at least 15 minutes before your scheduled appointment time.   If you have a lab appointment with the Cancer Center please come in thru the  Main Entrance and check in at the main information desk  You need to re-schedule your appointment should you arrive 10 or more minutes late.  We strive to give you quality time with our providers, and arriving late affects you and other patients whose appointments are after yours.  Also, if you no show three or more times for appointments you may be dismissed from the clinic at the providers discretion.     Again, thank you for choosing [facility].  Our hope is that these requests will decrease the amount of time that you wait before being seen by our physicians.       _____________________________________________________________  Should you have questions after your visit to [facility], please contact our office at [phone] between the hours of 8:00 a.m. and 4:30 p.m.  Voicemails left after 4:00 p.m. will not be returned until the following business day.  For prescription refill requests, have your pharmacy contact our office and allow 72 hours.    Cancer Center Support Programs:   > Cancer Support Group  2nd Tuesday of the month 1pm-2pm, Journey Room    

## 2019-12-27 NOTE — Assessment & Plan Note (Signed)
1.  Stage III (T3 N1 cM0) cecal adenocarcinoma: -Status post resection on 05/11/2019, MSI stable, 1 isolated tumor deposit, 0/18 lymph nodes. -Preoperative CEA was 6.1. -He was not felt to be a candidate for adjuvant chemotherapy based on his performance status. -We reviewed labs from 12/11/2019.  CEA is 7.3.  AST is elevated slightly.  CBC is normal. -We reviewed results of the CT abdomen and pelvis without contrast dated 12/25/2019 which showed no evidence of recurrence or metastatic disease. -There is fatty infiltration of the liver which is likely causing AST elevation.  Distal right ureteral calculus, 3 mm at UVJ.  Additional 3 mm calculus in the right proximal collecting system. -He will come back in 3 months with repeat labs and CEA level.  We will plan to repeat CT scans in 6 months.  2.  Iron deficiency state: -Continue oral iron therapy.  Latest hemoglobin was 14.6 and ferritin was 41.  Ferritin prior to that was 16.

## 2019-12-27 NOTE — Progress Notes (Signed)
[facility] [place], [place] [address]   CLINIC:  Medical Oncology/Hematology  PCP:  Caprice Renshaw, MD [address] [place] [address] [phone]   REASON FOR VISIT:  Follow-up for IDA, newly diagnosed colon cancer   CURRENT THERAPY: Clinical surveillance   INTERVAL HISTORY:  [name] 74 y.o. male seen for follow-up of his colon cancer.  He is accompanied by attendant from [facility].  Appetite is 100%.  Energy levels are 75%.  No evidence of bleeding reported in the stool.  No new onset pains.  No fevers or infections.  No recent ER visits or hospitalizations.   REVIEW OF SYSTEMS:  Review of Systems  Cardiovascular: Positive for leg swelling.  All other systems reviewed and are negative.    PAST MEDICAL/SURGICAL HISTORY:  Past Medical History:  Diagnosis Date  . Bell's palsy   . Bleeding ulcer    remote past  . Dementia    disoriented to time.does not know medical hx.information obtained from previous charts  . Hyperlipemia   . Hypertension   . Obesity   . SOB (shortness of breath) on exertion    Past Surgical History:  Procedure Laterality Date  . BIOPSY  05/09/2019   Procedure: BIOPSY;  Surgeon: Daneil Dolin, MD;  [facility];  Service: Endoscopy;;  . COLONOSCOPY  10/06/06   internal hemorrhoids and anal papilla/poor prep  . COLONOSCOPY N/A 05/09/2019   Procedure: COLONOSCOPY;  Surgeon: Daneil Dolin, MD;  [facility];  Service: Endoscopy;  Laterality: N/A;  . ESOPHAGOGASTRODUODENOSCOPY N/A 05/01/2018   multiple 3-6 mm gastric nodules and gastritis, surrounding telangectasias. Biopsy with atrophic gastritis.   Marland Kitchen FOOT SURGERY    . KNEE ARTHROSCOPY  06/17/2011   Procedure: ARTHROSCOPY KNEE;  Surgeon: Sanjuana Kava;  Location: [facility];  Service: Orthopedics;  Laterality: Right;  . LAPAROSCOPIC PARTIAL COLECTOMY N/A 05/11/2019   Procedure: ATTEMPTED LAPAROSCOPIC PARTIAL COLECTOMY;  Surgeon: [name], MD;  Location: [facility];  Service: General;  Laterality: N/A;  . PARTIAL COLECTOMY  05/11/2019   Procedure: PARTIAL COLECTOMY (Laparotomy incision at 3846;  Surgeon: [name], MD;  Location: [facility];  Service: General;;  . POLYPECTOMY  05/09/2019   Procedure: POLYPECTOMY;  Surgeon: Daneil Dolin, MD;  [facility];  Service: Endoscopy;;     SOCIAL HISTORY:  Social History   Socioeconomic History  . Marital status: Single    Spouse name: Not on file  . Number of children: Not on file  . Years of education: Not on file  . Highest education level: Not on file  Occupational History  . Not on file  Tobacco Use  . Smoking status: Former Smoker    Packs/day: 1.00    Years: 30.00    Pack years: 30.00    Types: Cigarettes  . Smokeless tobacco: Never Used  . Tobacco comment: quit a "long time ago"   Substance and Sexual Activity  . Alcohol use: No  . Drug use: No  . Sexual activity: Never  Other Topics Concern  . Not on file  Social History Narrative  . Not on file   Social Determinants of Health   Financial Resource Strain:   . Difficulty of Paying Living Expenses:   Food Insecurity:   . Worried About Charity fundraiser in the Last Year:   . Arboriculturist in the Last Year:   Transportation Needs:   .  Lack of Transportation (Medical):   Marland Kitchen Lack of Transportation (Non-Medical):   Physical Activity:   . Days of Exercise per Week:   . Minutes of Exercise per Session:   Stress:   . Feeling of Stress :   Social Connections:   . Frequency of Communication with Friends and Family:   . Frequency of Social Gatherings with Friends and Family:   . Attends Religious Services:   . Active Member of Clubs or Organizations:   . Attends Archivist Meetings:   Marland Kitchen Marital Status:   Intimate Partner Violence:   . Fear of Current or Ex-Partner:   . Emotionally Abused:   Marland Kitchen Physically Abused:   . Sexually Abused:     FAMILY HISTORY:  Family History    Problem Relation Age of Onset  . COPD Mother   . Cancer Brother   . Anesthesia problems Neg Hx   . Hypotension Neg Hx   . Malignant hyperthermia Neg Hx   . Pseudochol deficiency Neg Hx   . Colon cancer Neg Hx     CURRENT MEDICATIONS:  Outpatient Encounter Medications as of 12/27/2019  Medication Sig  . ferrous sulfate 325 (65 FE) MG EC tablet Take 325 mg by mouth 2 (two) times daily.  . insulin detemir (LEVEMIR) 100 UNIT/ML injection Inject 10 Units into the skin 2 (two) times daily.  . insulin lispro (HUMALOG) 100 UNIT/ML injection Inject into the skin 3 (three) times daily before meals. Sliding scale  . Omega-3 Fatty Acids (FISH OIL) 1000 MG CAPS Take 1 capsule by mouth daily.  Marland Kitchen senna-docusate (SENOKOT-S) 8.6-50 MG tablet Take 1 tablet by mouth 2 (two) times daily.  . tamsulosin (FLOMAX) 0.4 MG CAPS capsule Take 0.4 mg by mouth daily.  . vitamin B-12 (CYANOCOBALAMIN) 1000 MCG tablet Take 1,000 mcg by mouth daily.  Marland Kitchen acetaminophen (TYLENOL) 650 MG CR tablet Take 650 mg by mouth every 6 (six) hours as needed for pain.  Marland Kitchen Propylene Glycol (SYSTANE BALANCE) 0.6 % SOLN Apply 1 drop to eye 4 (four) times daily as needed. PRN  . [DISCONTINUED] aspirin EC 81 MG tablet Take 81 mg by mouth daily.  . [DISCONTINUED] calcium gluconate 500 MG tablet Take 1 tablet by mouth 3 (three) times daily.  . [DISCONTINUED] cimetidine (TAGAMET) 800 MG tablet Take 800 mg by mouth at bedtime.  . [DISCONTINUED] divalproex (DEPAKOTE) 125 MG DR tablet Take 125 mg by mouth 2 (two) times daily.  . [DISCONTINUED] FLUoxetine (PROZAC) 10 MG tablet Take 10 mg by mouth daily.  . [DISCONTINUED] FLUoxetine (PROZAC) 20 MG tablet Take 20 mg by mouth daily.   No facility-administered encounter medications on file as of 12/27/2019.    ALLERGIES:  Allergies  Allergen Reactions  . Hydrocodone   . Hydrocodone-Acetaminophen     unknown  . Aplisol [Tuberculin Ppd] Rash     PHYSICAL EXAM:  ECOG Performance status:  3  Vitals:   12/27/19 1450  BP: (!) 144/80  Pulse: 80  Resp: 18  Temp: (!) 97.3 F (36.3 C)  SpO2: 96%   Filed Weights   12/27/19 1450  Weight: 247 lb 9.6 oz (112.3 kg)    Physical Exam Constitutional:      Appearance: Normal appearance. He is obese.  HENT:     Head: Normocephalic.  Cardiovascular:     Rate and Rhythm: Normal rate and regular rhythm.     Pulses: Normal pulses.     Heart sounds: Normal heart sounds.  Pulmonary:  Effort: Pulmonary effort is normal.     Breath sounds: Normal breath sounds.  Abdominal:     Palpations: Abdomen is soft.  Musculoskeletal:     Comments: Decreased BLE ROM and strength   Skin:    General: Skin is warm.  Neurological:     Mental Status: He is alert.  Psychiatric:        Mood and Affect: Mood normal.        Behavior: Behavior normal.      LABORATORY DATA:  I have reviewed the labs as listed.  CBC    Component Value Date/Time   WBC 5.3 12/11/2019 1241   RBC 4.65 12/11/2019 1241   HGB 14.6 12/11/2019 1241   HCT 44.3 12/11/2019 1241   PLT 169 12/11/2019 1241   MCV 95.3 12/11/2019 1241   MCH 31.4 12/11/2019 1241   MCHC 33.0 12/11/2019 1241   RDW 13.0 12/11/2019 1241   LYMPHSABS 1.7 12/11/2019 1241   MONOABS 0.5 12/11/2019 1241   EOSABS 0.2 12/11/2019 1241   BASOSABS 0.1 12/11/2019 1241   CMP Latest Ref Rng & Units 12/11/2019 06/12/2019 05/15/2019  Glucose 70 - 99 mg/dL 123(H) 125(H) 115(H)  BUN 8 - 23 mg/dL '12 15 8  ' Creatinine 0.61 - 1.24 mg/dL 0.49(L) 0.71 0.55(L)  Sodium 135 - 145 mmol/L 137 140 139  Potassium 3.5 - 5.1 mmol/L 3.8 3.8 3.3(L)  Chloride 98 - 111 mmol/L 101 102 104  CO2 22 - 32 mmol/L '26 29 28  ' Calcium 8.9 - 10.3 mg/dL 8.9 8.6(L) 8.2(L)  Total Protein 6.5 - 8.1 g/dL 7.3 6.7 -  Total Bilirubin 0.3 - 1.2 mg/dL 0.8 0.7 -  Alkaline Phos 38 - 126 U/L 94 94 -  AST 15 - 41 U/L 43(H) 26 -  ALT 0 - 44 U/L 42 22 -    I have reviewed his scans independently.   ASSESSMENT & PLAN:   Cecal cancer  ([place]) 1.  Stage III (T3 N1 cM0) cecal adenocarcinoma: -Status post resection on 05/11/2019, MSI stable, 1 isolated tumor deposit, 0/18 lymph nodes. -Preoperative CEA was 6.1. -He was not felt to be a candidate for adjuvant chemotherapy based on his performance status. -We reviewed labs from 12/11/2019.  CEA is 7.3.  AST is elevated slightly.  CBC is normal. -We reviewed results of the CT abdomen and pelvis without contrast dated 12/25/2019 which showed no evidence of recurrence or metastatic disease. -There is fatty infiltration of the liver which is likely causing AST elevation.  Distal right ureteral calculus, 3 mm at UVJ.  Additional 3 mm calculus in the right proximal collecting system. -He will come back in 3 months with repeat labs and CEA level.  We will plan to repeat CT scans in 6 months.  2.  Iron deficiency state: -Continue oral iron therapy.  Latest hemoglobin was 14.6 and ferritin was 41.  Ferritin prior to that was 16.      Orders placed this encounter:  No orders of the defined types were placed in this encounter.     Donia Ast, [place] [phone]

## 2019-12-28 DIAGNOSIS — Z79899 Other long term (current) drug therapy: Secondary | ICD-10-CM | POA: Diagnosis not present

## 2019-12-28 DIAGNOSIS — E559 Vitamin D deficiency, unspecified: Secondary | ICD-10-CM | POA: Diagnosis not present

## 2020-01-01 DIAGNOSIS — Z5181 Encounter for therapeutic drug level monitoring: Secondary | ICD-10-CM | POA: Diagnosis not present

## 2020-01-01 DIAGNOSIS — E559 Vitamin D deficiency, unspecified: Secondary | ICD-10-CM | POA: Diagnosis not present

## 2020-01-04 DIAGNOSIS — E559 Vitamin D deficiency, unspecified: Secondary | ICD-10-CM | POA: Diagnosis not present

## 2020-01-04 DIAGNOSIS — Z79899 Other long term (current) drug therapy: Secondary | ICD-10-CM | POA: Diagnosis not present

## 2020-01-15 DIAGNOSIS — I2510 Atherosclerotic heart disease of native coronary artery without angina pectoris: Secondary | ICD-10-CM | POA: Diagnosis not present

## 2020-01-15 DIAGNOSIS — E785 Hyperlipidemia, unspecified: Secondary | ICD-10-CM | POA: Diagnosis not present

## 2020-01-15 DIAGNOSIS — D509 Iron deficiency anemia, unspecified: Secondary | ICD-10-CM | POA: Diagnosis not present

## 2020-01-30 DIAGNOSIS — L84 Corns and callosities: Secondary | ICD-10-CM | POA: Diagnosis not present

## 2020-01-30 DIAGNOSIS — I739 Peripheral vascular disease, unspecified: Secondary | ICD-10-CM | POA: Diagnosis not present

## 2020-02-08 DIAGNOSIS — Z5181 Encounter for therapeutic drug level monitoring: Secondary | ICD-10-CM | POA: Diagnosis not present

## 2020-02-08 DIAGNOSIS — E559 Vitamin D deficiency, unspecified: Secondary | ICD-10-CM | POA: Diagnosis not present

## 2020-03-03 DIAGNOSIS — D696 Thrombocytopenia, unspecified: Secondary | ICD-10-CM | POA: Diagnosis not present

## 2020-03-03 DIAGNOSIS — I2510 Atherosclerotic heart disease of native coronary artery without angina pectoris: Secondary | ICD-10-CM | POA: Diagnosis not present

## 2020-03-03 DIAGNOSIS — E785 Hyperlipidemia, unspecified: Secondary | ICD-10-CM | POA: Diagnosis not present

## 2020-03-03 DIAGNOSIS — C180 Malignant neoplasm of cecum: Secondary | ICD-10-CM | POA: Diagnosis not present

## 2020-03-12 ENCOUNTER — Encounter: Payer: Self-pay | Admitting: Internal Medicine

## 2020-03-25 ENCOUNTER — Other Ambulatory Visit: Payer: Self-pay

## 2020-03-25 ENCOUNTER — Inpatient Hospital Stay (HOSPITAL_COMMUNITY): Payer: Medicare (Managed Care) | Attending: Hematology

## 2020-03-25 DIAGNOSIS — C180 Malignant neoplasm of cecum: Secondary | ICD-10-CM

## 2020-03-25 LAB — CBC WITH DIFFERENTIAL/PLATELET
Abs Immature Granulocytes: 0.03 10*3/uL (ref 0.00–0.07)
Basophils Absolute: 0 10*3/uL (ref 0.0–0.1)
Basophils Relative: 1 %
Eosinophils Absolute: 0.1 10*3/uL (ref 0.0–0.5)
Eosinophils Relative: 3 %
HCT: 43.1 % (ref 39.0–52.0)
Hemoglobin: 13.9 g/dL (ref 13.0–17.0)
Immature Granulocytes: 1 %
Lymphocytes Relative: 38 %
Lymphs Abs: 1.6 10*3/uL (ref 0.7–4.0)
MCH: 30.8 pg (ref 26.0–34.0)
MCHC: 32.3 g/dL (ref 30.0–36.0)
MCV: 95.4 fL (ref 80.0–100.0)
Monocytes Absolute: 0.4 10*3/uL (ref 0.1–1.0)
Monocytes Relative: 11 %
Neutro Abs: 2 10*3/uL (ref 1.7–7.7)
Neutrophils Relative %: 46 %
Platelets: 179 10*3/uL (ref 150–400)
RBC: 4.52 MIL/uL (ref 4.22–5.81)
RDW: 13.1 % (ref 11.5–15.5)
WBC: 4.2 10*3/uL (ref 4.0–10.5)
nRBC: 0 % (ref 0.0–0.2)

## 2020-03-25 LAB — COMPREHENSIVE METABOLIC PANEL
ALT: 35 U/L (ref 0–44)
AST: 34 U/L (ref 15–41)
Albumin: 3.7 g/dL (ref 3.5–5.0)
Alkaline Phosphatase: 99 U/L (ref 38–126)
Anion gap: 9 (ref 5–15)
BUN: 13 mg/dL (ref 8–23)
CO2: 29 mmol/L (ref 22–32)
Calcium: 8.7 mg/dL — ABNORMAL LOW (ref 8.9–10.3)
Chloride: 100 mmol/L (ref 98–111)
Creatinine, Ser: 0.56 mg/dL — ABNORMAL LOW (ref 0.61–1.24)
GFR calc Af Amer: >60 mL/min (ref >60)
GFR calc non Af Amer: >60 mL/min (ref >60)
Glucose, Bld: 159 mg/dL — ABNORMAL HIGH (ref 70–99)
Potassium: 3.5 mmol/L (ref 3.5–5.1)
Sodium: 138 mmol/L (ref 135–145)
Total Bilirubin: 0.8 mg/dL (ref 0.3–1.2)
Total Protein: 6.8 g/dL (ref 6.5–8.1)

## 2020-03-26 LAB — CEA: CEA: 5.8 ng/mL — ABNORMAL HIGH (ref 0.0–4.7)

## 2020-04-01 ENCOUNTER — Inpatient Hospital Stay (HOSPITAL_COMMUNITY): Payer: Medicare (Managed Care) | Attending: Hematology | Admitting: Nurse Practitioner

## 2020-04-01 ENCOUNTER — Other Ambulatory Visit: Payer: Self-pay

## 2020-04-01 VITALS — BP 124/73 | HR 89 | Temp 97.5°F | Resp 18

## 2020-04-01 DIAGNOSIS — C180 Malignant neoplasm of cecum: Secondary | ICD-10-CM | POA: Insufficient documentation

## 2020-04-01 DIAGNOSIS — Z885 Allergy status to narcotic agent status: Secondary | ICD-10-CM | POA: Diagnosis not present

## 2020-04-01 DIAGNOSIS — N201 Calculus of ureter: Secondary | ICD-10-CM | POA: Insufficient documentation

## 2020-04-01 DIAGNOSIS — Z809 Family history of malignant neoplasm, unspecified: Secondary | ICD-10-CM | POA: Diagnosis not present

## 2020-04-01 DIAGNOSIS — Z79899 Other long term (current) drug therapy: Secondary | ICD-10-CM | POA: Insufficient documentation

## 2020-04-01 DIAGNOSIS — Z87891 Personal history of nicotine dependence: Secondary | ICD-10-CM | POA: Diagnosis not present

## 2020-04-01 DIAGNOSIS — E611 Iron deficiency: Secondary | ICD-10-CM | POA: Diagnosis not present

## 2020-04-01 DIAGNOSIS — Z8719 Personal history of other diseases of the digestive system: Secondary | ICD-10-CM | POA: Diagnosis not present

## 2020-04-01 DIAGNOSIS — K760 Fatty (change of) liver, not elsewhere classified: Secondary | ICD-10-CM | POA: Insufficient documentation

## 2020-04-01 DIAGNOSIS — Z836 Family history of other diseases of the respiratory system: Secondary | ICD-10-CM | POA: Diagnosis not present

## 2020-04-01 NOTE — Patient Instructions (Signed)
[facility] Discharge Instructions  Follow up in 3 months with labs and CT scan    Thank you for choosing  [facility] Center at [facility] to provide your oncology and hematology care.  To afford each patient quality time with our provider, please arrive at least 15 minutes before your scheduled appointment time.   If you have a lab appointment with the Cancer Center please come in thru the Main Entrance and check in at the main information desk.  You need to re-schedule your appointment should you arrive 10 or more minutes late.  We strive to give you quality time with our providers, and arriving late affects you and other patients whose appointments are after yours.  Also, if you no show three or more times for appointments you may be dismissed from the clinic at the providers discretion.     Again, thank you for choosing [facility].  Our hope is that these requests will decrease the amount of time that you wait before being seen by our physicians.       _____________________________________________________________  Should you have questions after your visit to [facility], please contact our office at [phone] between the hours of 8:00 a.m. and 4:30 p.m.  Voicemails left after 4:00 p.m. will not be returned until the following business day.  For prescription refill requests, have your pharmacy contact our office and allow 72 hours.    Due to Covid, you will need to wear a mask upon entering the hospital. If you do not have a mask, a mask will be given to you at the Main Entrance upon arrival. For doctor visits, patients may have 1 support person with them. For treatment visits, patients can not have anyone with them due to social distancing guidelines and our immunocompromised population.      

## 2020-04-01 NOTE — Progress Notes (Signed)
[name], [name] [id number]   CLINIC:  Medical Oncology/Hematology  PCP:  Caprice Renshaw, [place] [address] [phone]   REASON FOR VISIT: Follow-up for cecal cancer   CURRENT THERAPY: Observation   INTERVAL HISTORY:  [name] 74 y.o. male returns for routine follow-up for cecal cancer.  Patient reports he is doing well since his last visit.  He denies any bright red bleeding per rectum or melena.  He denies any easy bruising or bleeding.  He denies any change in bowel habits. Denies any nausea, vomiting, or diarrhea. Denies any new pains. Had not noticed any recent bleeding such as epistaxis, hematuria or hematochezia. Denies recent chest pain on exertion, shortness of breath on minimal exertion, pre-syncopal episodes, or palpitations. Denies any numbness or tingling in hands or feet. Denies any recent fevers, infections, or recent hospitalizations. Patient reports appetite at 100% and energy level at 75%.    REVIEW OF SYSTEMS:  Review of Systems  All other systems reviewed and are negative.    PAST MEDICAL/SURGICAL HISTORY:  Past Medical History:  Diagnosis Date  . Bell's palsy   . Bleeding ulcer    remote past  . Dementia    disoriented to time.does not know medical hx.information obtained from previous charts  . Hyperlipemia   . Hypertension   . Obesity   . SOB (shortness of breath) on exertion    Past Surgical History:  Procedure Laterality Date  . BIOPSY  05/09/2019   Procedure: BIOPSY;  Surgeon: Daneil Dolin, MD;  [facility];  Service: Endoscopy;;  . COLONOSCOPY  10/06/06   internal hemorrhoids and anal papilla/poor prep  . COLONOSCOPY N/A 05/09/2019   Procedure: COLONOSCOPY;  Surgeon: Daneil Dolin, MD;  [facility];  Service: Endoscopy;  Laterality: N/A;  . ESOPHAGOGASTRODUODENOSCOPY N/A 05/01/2018   multiple 3-6 mm gastric nodules and gastritis, surrounding telangectasias.  Biopsy with atrophic gastritis.   Marland Kitchen FOOT SURGERY    . KNEE ARTHROSCOPY  06/17/2011   Procedure: ARTHROSCOPY KNEE;  Surgeon: Sanjuana Kava;  Location: [facility];  Service: Orthopedics;  Laterality: Right;  . LAPAROSCOPIC PARTIAL COLECTOMY N/A 05/11/2019   Procedure: ATTEMPTED LAPAROSCOPIC PARTIAL COLECTOMY;  Surgeon: [name], MD;  Location: [facility];  Service: General;  Laterality: N/A;  . PARTIAL COLECTOMY  05/11/2019   Procedure: PARTIAL COLECTOMY (Laparotomy incision at 2707;  Surgeon: [name], MD;  Location: [facility];  Service: General;;  . POLYPECTOMY  05/09/2019   Procedure: POLYPECTOMY;  Surgeon: Daneil Dolin, MD;  [facility];  Service: Endoscopy;;     SOCIAL HISTORY:  Social History   Socioeconomic History  . Marital status: Single    Spouse name: Not on file  . Number of children: Not on file  . Years of education: Not on file  . Highest education level: Not on file  Occupational History  . Not on file  Tobacco Use  . Smoking status: Former Smoker    Packs/day: 1.00    Years: 30.00    Pack years: 30.00    Types: Cigarettes  . Smokeless tobacco: Never Used  . Tobacco comment: quit a "long time ago"   Vaping Use  . Vaping Use: Never used  Substance and Sexual Activity  . Alcohol use: No  . Drug use: No  . Sexual activity: Never  Other Topics Concern  . Not on file  Social History Narrative  .  Not on file   Social Determinants of Health   Financial Resource Strain:   . Difficulty of Paying Living Expenses:   Food Insecurity:   . Worried About Charity fundraiser in the Last Year:   . Arboriculturist in the Last Year:   Transportation Needs:   . Film/video editor (Medical):   Marland Kitchen Lack of Transportation (Non-Medical):   Physical Activity:   . Days of Exercise per Week:   . Minutes of Exercise per Session:   Stress:   . Feeling of Stress :   Social Connections:   . Frequency of Communication with Friends and Family:   .  Frequency of Social Gatherings with Friends and Family:   . Attends Religious Services:   . Active Member of Clubs or Organizations:   . Attends Archivist Meetings:   Marland Kitchen Marital Status:   Intimate Partner Violence:   . Fear of Current or Ex-Partner:   . Emotionally Abused:   Marland Kitchen Physically Abused:   . Sexually Abused:     FAMILY HISTORY:  Family History  Problem Relation Age of Onset  . COPD Mother   . Cancer Brother   . Anesthesia problems Neg Hx   . Hypotension Neg Hx   . Malignant hyperthermia Neg Hx   . Pseudochol deficiency Neg Hx   . Colon cancer Neg Hx     CURRENT MEDICATIONS:  Outpatient Encounter Medications as of 04/01/2020  Medication Sig  . ferrous sulfate 325 (65 FE) MG EC tablet Take 325 mg by mouth 2 (two) times daily.  . insulin detemir (LEVEMIR) 100 UNIT/ML injection Inject 10 Units into the skin 2 (two) times daily.  . insulin lispro (HUMALOG) 100 UNIT/ML injection Inject into the skin 3 (three) times daily before meals. Sliding scale  . Omega-3 Fatty Acids (FISH OIL) 1000 MG CAPS Take 1 capsule by mouth daily.  Marland Kitchen senna-docusate (SENOKOT-S) 8.6-50 MG tablet Take 1 tablet by mouth 2 (two) times daily.  . tamsulosin (FLOMAX) 0.4 MG CAPS capsule Take 0.4 mg by mouth daily.  . vitamin B-12 (CYANOCOBALAMIN) 1000 MCG tablet Take 1,000 mcg by mouth daily.  Marland Kitchen acetaminophen (TYLENOL) 650 MG CR tablet Take 650 mg by mouth every 6 (six) hours as needed for pain. (Patient not taking: Reported on 04/01/2020)  . Propylene Glycol (SYSTANE BALANCE) 0.6 % SOLN Apply 1 drop to eye 4 (four) times daily as needed. PRN (Patient not taking: Reported on 04/01/2020)   No facility-administered encounter medications on file as of 04/01/2020.    ALLERGIES:  Allergies  Allergen Reactions  . Hydrocodone   . Hydrocodone-Acetaminophen     unknown  . Aplisol [Tuberculin Ppd] Rash     PHYSICAL EXAM:  ECOG Performance status: 1  Vitals:   04/01/20 1437  BP: 124/73  Pulse: 89    Resp: 18  Temp: (!) 97.5 F (36.4 C)  SpO2: 93%   There were no vitals filed for this visit.     Physical Exam Constitutional:      Appearance: Normal appearance. He is normal weight.  Cardiovascular:     Rate and Rhythm: Normal rate and regular rhythm.     Heart sounds: Normal heart sounds.  Pulmonary:     Effort: Pulmonary effort is normal.     Breath sounds: Normal breath sounds.  Abdominal:     General: Bowel sounds are normal.     Palpations: Abdomen is soft.  Musculoskeletal:  General: Normal range of motion.  Skin:    General: Skin is warm.  Neurological:     Mental Status: He is alert and oriented to person, place, and time. Mental status is at baseline.  Psychiatric:        Mood and Affect: Mood normal.        Behavior: Behavior normal.        Thought Content: Thought content normal.        Judgment: Judgment normal.      LABORATORY DATA:  I have reviewed the labs as listed.  CBC    Component Value Date/Time   WBC 4.2 03/25/2020 1417   RBC 4.52 03/25/2020 1417   HGB 13.9 03/25/2020 1417   HCT 43.1 03/25/2020 1417   PLT 179 03/25/2020 1417   MCV 95.4 03/25/2020 1417   MCH 30.8 03/25/2020 1417   MCHC 32.3 03/25/2020 1417   RDW 13.1 03/25/2020 1417   LYMPHSABS 1.6 03/25/2020 1417   MONOABS 0.4 03/25/2020 1417   EOSABS 0.1 03/25/2020 1417   BASOSABS 0.0 03/25/2020 1417   CMP Latest Ref Rng & Units 03/25/2020 12/11/2019 06/12/2019  Glucose 70 - 99 mg/dL 159(H) 123(H) 125(H)  BUN 8 - 23 mg/dL '13 12 15  ' Creatinine 0.61 - 1.24 mg/dL 0.56(L) 0.49(L) 0.71  Sodium 135 - 145 mmol/L 138 137 140  Potassium 3.5 - 5.1 mmol/L 3.5 3.8 3.8  Chloride 98 - 111 mmol/L 100 101 102  CO2 22 - 32 mmol/L '29 26 29  ' Calcium 8.9 - 10.3 mg/dL 8.7(L) 8.9 8.6(L)  Total Protein 6.5 - 8.1 g/dL 6.8 7.3 6.7  Total Bilirubin 0.3 - 1.2 mg/dL 0.8 0.8 0.7  Alkaline Phos 38 - 126 U/L 99 94 94  AST 15 - 41 U/L 34 43(H) 26  ALT 0 - 44 U/L 35 42 22    All questions were  answered to patient's stated satisfaction. Encouraged patient to call with any new concerns or questions before his next visit to the cancer center and we can certain see him sooner, if needed.     ASSESSMENT & PLAN:  Cecal cancer ([place]) 1.  Stage III cecal adenocarcinoma: -Status post resection on 05/11/2019, MSI stable, 1 isolated tumor deposit, 0/18 lymph nodes. -Preoperative CEA was 6.1. -He was not felt to be a candidate for adjuvant chemotherapy based on his performance status. -Labs on 03/09/2020 showed his CEA at 7.3.  AST is slightly elevated. -CT [facility] on 12/25/2019 showed no evidence of recurrent or metastatic disease. -There is fatty infiltration of the liver which is likely causing his AST elevation.  Distal right ureteral calculus, 3 mm at UVJ.  Additional 3 mm calculus in the right proximal collecting system. -Labs done on 03/25/2020 showed CEA level 5.8. -She will come back in 3 months with repeat labs and CT scan.  2.  Iron deficiency state: -She will continue oral iron therapy. -Labs done on 03/25/2020 showed hemoglobin 13.9     Orders placed this encounter:  Orders Placed This Encounter  Procedures  . CT ABDOMEN PELVIS W CONTRAST  . Lactate dehydrogenase  . CBC with Differential/Platelet  . Comprehensive metabolic panel  . CEA  . Vitamin B12  . VITAMIN D 25 Hydroxy (Vit-D Deficiency, Fractures)  . Ferritin  . Iron and TIBC      Francene Finders, FNP-C [facility] [phone]

## 2020-04-01 NOTE — Assessment & Plan Note (Signed)
1.  Stage III cecal adenocarcinoma: -Status post resection on 05/11/2019, MSI stable, 1 isolated tumor deposit, 0/18 lymph nodes. -Preoperative CEA was 6.1. -He was not felt to be a candidate for adjuvant chemotherapy based on his performance status. -Labs on 03/09/2020 showed his CEA at 7.3.  AST is slightly elevated. -CT [facility] on 12/25/2019 showed no evidence of recurrent or metastatic disease. -There is fatty infiltration of the liver which is likely causing his AST elevation.  Distal right ureteral calculus, 3 mm at UVJ.  Additional 3 mm calculus in the right proximal collecting system. -Labs done on 03/25/2020 showed CEA level 5.8. -She will come back in 3 months with repeat labs and CT scan.  2.  Iron deficiency state: -She will continue oral iron therapy. -Labs done on 03/25/2020 showed hemoglobin 13.9

## 2020-05-06 DIAGNOSIS — I739 Peripheral vascular disease, unspecified: Secondary | ICD-10-CM | POA: Diagnosis not present

## 2020-05-06 DIAGNOSIS — M1990 Unspecified osteoarthritis, unspecified site: Secondary | ICD-10-CM | POA: Diagnosis not present

## 2020-05-06 DIAGNOSIS — K219 Gastro-esophageal reflux disease without esophagitis: Secondary | ICD-10-CM | POA: Diagnosis not present

## 2020-05-21 ENCOUNTER — Encounter: Payer: Self-pay | Admitting: Gastroenterology

## 2020-05-21 ENCOUNTER — Telehealth: Payer: Self-pay | Admitting: *Deleted

## 2020-05-21 ENCOUNTER — Other Ambulatory Visit: Payer: Self-pay

## 2020-05-21 ENCOUNTER — Ambulatory Visit (INDEPENDENT_AMBULATORY_CARE_PROVIDER_SITE_OTHER): Payer: Medicare (Managed Care) | Admitting: Gastroenterology

## 2020-05-21 VITALS — BP 148/75 | HR 93 | Temp 97.1°F | Ht 69.0 in

## 2020-05-21 DIAGNOSIS — C180 Malignant neoplasm of cecum: Secondary | ICD-10-CM | POA: Diagnosis not present

## 2020-05-21 MED ORDER — PEG 3350-KCL-NA BICARB-NACL 420 G PO SOLR: ORAL | 0 refills | Status: DC

## 2020-05-21 MED ORDER — LINACLOTIDE 290 MCG PO CAPS: 290.0000 ug | ORAL_CAPSULE | Freq: Every day | ORAL | 0 refills | Status: DC

## 2020-05-21 NOTE — Patient Instructions (Signed)
1. Colonoscopy to be scheduled. See separate instructions.  

## 2020-05-21 NOTE — Telephone Encounter (Signed)
Called tierra and goes straight to VM. Called # in chart and it is for [employer]. Her ext is [phone]. Called and LMOVM  Called [name] and procedure has been scheduled for 07/16/20 at 11:00am, arrival 8:00am for rapid covid testing morning of procedure. Will fax Rx to pharmacy and will fax instructions to [phone].

## 2020-05-21 NOTE — Progress Notes (Signed)
Primary Care Physician: Caprice Renshaw, MD  Primary Gastroenterologist:  Garfield Cornea, MD   Chief Complaint  Patient presents with  . Colonoscopy    recall    HPI: [name] is a 74 y.o. male, nursing home resident with dementia, here to schedule one year surveillance colonoscopy for history of stage III cecal adenocarcinoma diagnosed in August 2020.  He underwent a right hemicolectomy.  No chemotherapy due to poor performance status.  Continues to follow with oncology for colon cancer as well as iron deficiency anemia.  CEA in June was 5.8 down from 7.3 in March 2021.  Initial CEA level 6.1 at time of diagnosis.  Presents unaccompanied today from [facility]. Patient is hard of hearing. Answers basic questions but I do get a sense that he does not completely understand and history is unreliable. He states he has been bed bound/wheelchair bound for a long time after messing up his foot. He is non ambulatory. He denies abdominal pain. States his BMs are regular. He is not sure if there has been blood in the stool. No N/V or heartburn.   His legal guardian is [name] at [phone].  Current Outpatient Medications  Medication Sig Dispense Refill  . acetaminophen (TYLENOL) 650 MG CR tablet Take 650 mg by mouth 3 (three) times daily.     Marland Kitchen atenolol (TENORMIN) 25 MG tablet Take by mouth daily.    Marland Kitchen atorvastatin (LIPITOR) 10 MG tablet Take 5 mg by mouth daily.    . carboxymethylcellulose (REFRESH TEARS) 0.5 % SOLN 1 drop every 6 (six) hours as needed.    . cholecalciferol (VITAMIN D3) 25 MCG (1000 UNIT) tablet Take 1,000 Units by mouth daily.    . cycloSPORINE (RESTASIS) 0.05 % ophthalmic emulsion Place 1 drop into both eyes 2 (two) times daily.    . ferrous sulfate 325 (65 FE) MG EC tablet Take 325 mg by mouth 2 (two) times daily.    . furosemide (LASIX) 40 MG tablet Take 40 mg by mouth daily.    Marland Kitchen ipratropium-albuterol (DUONEB) 0.5-2.5 (3) MG/3ML SOLN Take 3 mLs by nebulization  every 8 (eight) hours as needed.    Marland Kitchen ketoconazole (NIZORAL) 2 % cream Apply 1 application topically as needed for irritation.    Marland Kitchen ketoconazole (NIZORAL) 2 % shampoo Apply 1 application topically as needed for irritation.    . magnesium hydroxide (MILK OF MAGNESIA) 400 MG/5ML suspension Take 30 mLs by mouth daily as needed for mild constipation.    . metFORMIN (GLUCOPHAGE) 500 MG tablet Take 500 mg by mouth at bedtime.    . mometasone (ELOCON) 0.1 % cream Apply 1 application topically as needed.    . Multiple Vitamin (MULTIVITAMIN) tablet Take 1 tablet by mouth daily.    . Omega-3 Fatty Acids (FISH OIL) 1000 MG CAPS Take 2 capsules by mouth daily.     . potassium chloride (KLOR-CON) 20 MEQ packet Take by mouth 2 (two) times daily.    . Skin Protectants, Misc. (EUCERIN) cream Apply topically daily.    . vitamin B-12 (CYANOCOBALAMIN) 1000 MCG tablet Take 1,000 mcg by mouth daily.    . tamsulosin (FLOMAX) 0.4 MG CAPS capsule Take 0.4 mg by mouth daily. (Patient not taking: Reported on 05/21/2020)     No current facility-administered medications for this visit.    Allergies as of 05/21/2020 - Review Complete 05/21/2020  Allergen Reaction Noted  . Hydrocodone  08/26/2017  . Hydrocodone-acetaminophen  06/17/2011  . Aplisol [  tuberculin ppd] Rash 06/14/2019   Past Medical History:  Diagnosis Date  . Bell's palsy   . Bleeding ulcer    remote past  . Dementia    disoriented to time.does not know medical hx.information obtained from previous charts  . Hard of hearing   . Hyperlipemia   . Hypertension   . Obesity   . SOB (shortness of breath) on exertion    Past Surgical History:  Procedure Laterality Date  . BIOPSY  05/09/2019   Procedure: BIOPSY;  Surgeon: Daneil Dolin, MD;  Location: [facility];  Service: Endoscopy;;  . COLONOSCOPY  10/06/06   internal hemorrhoids and anal papilla/poor prep  . 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  ESOPHAGOGASTRODUODENOSCOPY N/A 05/01/2018   multiple 3-6 mm gastric nodules and gastritis, surrounding telangectasias. Biopsy with atrophic gastritis.   Marland Kitchen FOOT SURGERY    . KNEE ARTHROSCOPY  06/17/2011   Procedure: ARTHROSCOPY KNEE;  Surgeon: Sanjuana Kava;  Location: [facility];  Service: Orthopedics;  Laterality: Right;  . LAPAROSCOPIC PARTIAL COLECTOMY N/A 05/11/2019   Procedure: ATTEMPTED LAPAROSCOPIC PARTIAL COLECTOMY;  Surgeon: [name], MD;  Location: [facility];  Service: General;  Laterality: N/A;  . PARTIAL COLECTOMY  05/11/2019   Procedure: PARTIAL COLECTOMY (Laparotomy incision at 6270;  Surgeon: [name], MD;  Location: [facility];  Service: General;;  . POLYPECTOMY  05/09/2019   Procedure: POLYPECTOMY;  Surgeon: Daneil Dolin, MD;  Location: [facility];  Service: Endoscopy;;   Family History  Problem Relation Age of Onset  . COPD Mother   . Cancer Brother   . Anesthesia problems Neg Hx   . Hypotension Neg Hx   . Malignant hyperthermia Neg Hx   . Pseudochol deficiency Neg Hx   . Colon cancer Neg Hx    Social History   Tobacco Use  . Smoking status: Former Smoker    Packs/day: 1.00    Years: 30.00    Pack years: 30.00    Types: Cigarettes  . Smokeless tobacco: Never Used  . Tobacco comment: quit a "long time ago"   Vaping Use  . Vaping Use: Never used  Substance Use Topics  . Alcohol use: No  . Drug use: No    ROS: unreliable  General: Negative for anorexia, weight loss, fever, chills, fatigue, weakness. ENT: Negative for hoarseness, difficulty swallowing , nasal congestion. CV: Negative for chest pain, angina, palpitations, dyspnea on exertion, peripheral edema.  Respiratory: Negative for dyspnea at rest, dyspnea on exertion, cough, sputum, wheezing.  GI: See history of present illness. GU:  Negative for dysuria, hematuria, urinary incontinence, urinary frequency, nocturnal urination.  Endo: Negative for unusual weight change.    Physical  Examination:   BP (!) 148/75   Pulse 93   Temp (!) 97.1 F (36.2 C) (Oral)   Ht 5\' 9"  (1.753 m)   BMI 36.56 kg/m   General: chronically ill appearing obese male in wheelchair. No acute distress.  Eyes: No icterus. Mouth: masked. Lungs: Clear to auscultation bilaterally.  Heart: Regular rate and rhythm, no murmurs rubs or gallops.  Abdomen: Bowel sounds are normal, nontender, nondistended, no hepatosplenomegaly or masses, no abdominal bruits or hernia , no rebound or guarding.   Extremities: 1+ lower extremity edema. Left leg in protective boot. Neuro: Alert and oriented x 4   Skin: Warm and dry, no jaundice.   Psych: Alert and cooperative, normal mood and affect.  Labs:  Lab Results  Component  Value Date   CREATININE 0.56 (L) 03/25/2020   BUN 13 03/25/2020   NA 138 03/25/2020   K 3.5 03/25/2020   CL 100 03/25/2020   CO2 29 03/25/2020   Lab Results  Component Value Date   ALT 35 03/25/2020   AST 34 03/25/2020   ALKPHOS 99 03/25/2020   BILITOT 0.8 03/25/2020   Lab Results  Component Value Date   WBC 4.2 03/25/2020   HGB 13.9 03/25/2020   HCT 43.1 03/25/2020   MCV 95.4 03/25/2020   PLT 179 03/25/2020   Lab Results  Component Value Date   IRON 62 12/11/2019   TIBC 320 12/11/2019   FERRITIN 41 12/11/2019     Imaging Studies: No results found.  Impression/plan:  74 year old gentleman with history of stage III cecal adenocarcinoma status post right hemicolectomy in August 2020, presenting for a 1 year surveillance colonoscopy. He continues to follow with oncology for CRC and IDA. Anemia resolved. He denies concerns but history is unreliable in the setting of dementia.   Plan for colonoscopy in the near future with Dr. Gala Romney. Conscious sedation as before. ASA III. Unfortunately we are not allowed to admit patients for assistance with bowel preparation right now due to bed shortage in setting of Covid. He will need to prep at the nursing facility.  I have discussed  the risks, alternatives, benefits with regards to but not limited to the risk of reaction to medication, bleeding, infection, perforation and the patient is agreeable to proceed. Written consent to be obtained from his legal guardian [name] at [phone]. He will have two days clear liquids and Linzess 253mcg daily for 5 days before prep to assist in adequately cleansing.

## 2020-05-21 NOTE — Telephone Encounter (Signed)
LMOVM for [name] (legal guardian) to consent to TCS w/ Dr. Gala Romney, conscious sedation

## 2020-05-21 NOTE — Telephone Encounter (Signed)
[name] called back and she gave verbal consent for patient to have TCS with Dr. Gala Romney done on 07/16/20. I advised the hospital may also call her on the phone as well for verbal consent. She voiced understanding.

## 2020-07-04 ENCOUNTER — Inpatient Hospital Stay (HOSPITAL_COMMUNITY): Payer: Medicare (Managed Care)

## 2020-07-04 ENCOUNTER — Ambulatory Visit (HOSPITAL_COMMUNITY): Payer: Medicare (Managed Care)

## 2020-07-08 ENCOUNTER — Ambulatory Visit (HOSPITAL_COMMUNITY): Payer: Medicare (Managed Care) | Admitting: Hematology

## 2020-07-16 ENCOUNTER — Telehealth: Payer: Self-pay | Admitting: Internal Medicine

## 2020-07-16 MED ORDER — PEG 3350-KCL-NA BICARB-NACL 420 G PO SOLR: ORAL | 0 refills | Status: DC

## 2020-07-16 MED ORDER — LINACLOTIDE 290 MCG PO CAPS: 290.0000 ug | ORAL_CAPSULE | Freq: Every day | ORAL | 0 refills | Status: DC

## 2020-07-16 NOTE — Telephone Encounter (Signed)
Sana from [facility] [phone], called and said that they were unaware that the patient was supposed to have a covid test and they need to reschedule his procedure

## 2020-07-16 NOTE — Addendum Note (Signed)
Addended by: Cheron Every on: 07/16/2020 10:04 AM   Modules accepted: Orders

## 2020-07-16 NOTE — Telephone Encounter (Signed)
Called spoke with [facility]. She states they were not aware patient needed covid test. I advised it is on the instructions for time of arrival. Patient has been rescheduled to 12/1 at 12:15pm. Aware will fax new instructions and new Rx to her at [phone]

## 2020-07-29 DIAGNOSIS — D509 Iron deficiency anemia, unspecified: Secondary | ICD-10-CM | POA: Diagnosis not present

## 2020-07-29 DIAGNOSIS — E538 Deficiency of other specified B group vitamins: Secondary | ICD-10-CM | POA: Diagnosis not present

## 2020-07-29 DIAGNOSIS — I10 Essential (primary) hypertension: Secondary | ICD-10-CM | POA: Diagnosis not present

## 2020-08-04 ENCOUNTER — Other Ambulatory Visit (HOSPITAL_COMMUNITY): Payer: Self-pay | Admitting: Surgery

## 2020-08-04 DIAGNOSIS — D500 Iron deficiency anemia secondary to blood loss (chronic): Secondary | ICD-10-CM

## 2020-08-04 DIAGNOSIS — C180 Malignant neoplasm of cecum: Secondary | ICD-10-CM

## 2020-08-05 ENCOUNTER — Inpatient Hospital Stay (HOSPITAL_COMMUNITY): Payer: Medicare (Managed Care) | Attending: Hematology

## 2020-08-05 ENCOUNTER — Ambulatory Visit (HOSPITAL_COMMUNITY): Admission: RE | Admit: 2020-08-05 | Payer: Medicare (Managed Care) | Source: Ambulatory Visit

## 2020-08-07 ENCOUNTER — Ambulatory Visit (HOSPITAL_COMMUNITY): Payer: Medicare (Managed Care) | Admitting: Oncology

## 2020-08-25 ENCOUNTER — Telehealth: Payer: Self-pay | Admitting: *Deleted

## 2020-08-25 NOTE — Telephone Encounter (Signed)
Received call from [facility] PA is required for longevity. Called [phone] and PA initiated. clinincals faxed to [phone]. Pending PA ref # [id number]

## 2020-08-26 ENCOUNTER — Telehealth: Payer: Self-pay | Admitting: *Deleted

## 2020-08-26 MED ORDER — LINACLOTIDE 290 MCG PO CAPS
290.0000 ug | ORAL_CAPSULE | Freq: Every day | ORAL | 0 refills | Status: DC
Start: 2020-08-26 — End: 2020-09-11

## 2020-08-26 MED ORDER — PEG 3350-KCL-NA BICARB-NACL 420 G PO SOLR: ORAL | 0 refills | Status: DC

## 2020-08-26 NOTE — Telephone Encounter (Signed)
Received call from [facility] in endo. She has tried numerous times to get in touch with patient legal guardian to get Verbal Consent for patient procedure tomorrow. She has not returned her call. I called facility to see if they could get in touch with her but no response as well. Patient procedure will need to be reschedule tomorrow as hospital has been unable to reach legal guardian. Procedure r/s'd with Vicky to 1/19 at 1:00pm. . Advised will fax instructions to her at [phone]. Called endo, spoke with [facility] and made aware

## 2020-08-26 NOTE — Telephone Encounter (Signed)
Received call from [facility]. Social worker called her back and gave verbal consent. She states if patient is still on clear liquids then we can proceed as originally scheduled for tomorrow.  Called [employer] and spoke with [place]. She states patient is on clear liquids still. Advised will proceed with procedure as originally scheduled for tomorrow. She voiced understanding.  [facility] and she has placed patient back on scheduled for tomorrow. Patient new arrival time is 10:!5am. Tretha Sciara B called and spoke with Danae Chen again and made aware of patient's new arrival time for procedure. She is also aware to disregard new instructions faxed over.

## 2020-08-27 ENCOUNTER — Encounter: Payer: Self-pay | Admitting: Internal Medicine

## 2020-08-27 ENCOUNTER — Ambulatory Visit (HOSPITAL_COMMUNITY)
Admission: RE | Admit: 2020-08-27 | Discharge: 2020-08-27 | Disposition: A | Discharge status: Home / Self Care | Payer: Medicare (Managed Care) | Attending: Internal Medicine | Admitting: Internal Medicine

## 2020-08-27 ENCOUNTER — Encounter (HOSPITAL_COMMUNITY)
Admission: RE | Disposition: A | Discharge status: Home / Self Care | Payer: Self-pay | Source: Home / Self Care | Attending: Internal Medicine

## 2020-08-27 ENCOUNTER — Telehealth: Payer: Self-pay | Admitting: *Deleted

## 2020-08-27 DIAGNOSIS — Z85038 Personal history of other malignant neoplasm of large intestine: Secondary | ICD-10-CM | POA: Diagnosis not present

## 2020-08-27 DIAGNOSIS — Z1211 Encounter for screening for malignant neoplasm of colon: Secondary | ICD-10-CM | POA: Diagnosis not present

## 2020-08-27 DIAGNOSIS — Z20822 Contact with and (suspected) exposure to covid-19: Secondary | ICD-10-CM | POA: Insufficient documentation

## 2020-08-27 DIAGNOSIS — Z5309 Procedure and treatment not carried out because of other contraindication: Secondary | ICD-10-CM | POA: Insufficient documentation

## 2020-08-27 LAB — RESP PANEL BY RT-PCR (FLU A&B, COVID) ARPGX2
Influenza A by PCR: NEGATIVE
Influenza B by PCR: NEGATIVE
SARS Coronavirus 2 by RT PCR: NEGATIVE

## 2020-08-27 SURGERY — COLONOSCOPY
Anesthesia: Moderate Sedation

## 2020-08-27 NOTE — Telephone Encounter (Signed)
Needs a face-to-face visit with Neil Crouch

## 2020-08-27 NOTE — Telephone Encounter (Signed)
noted 

## 2020-08-27 NOTE — OR Nursing (Signed)
Patient in for a colonoscopy. Upon arrival, patient had solid stool in his depends. Dr. Gala Romney notified. Patient's procedure cancelled for today and will be rescheduled.

## 2020-08-27 NOTE — Telephone Encounter (Signed)
Erline Levine, please schedule OV thanks

## 2020-08-27 NOTE — Telephone Encounter (Signed)
Scheduled patient and sent letter  

## 2020-08-27 NOTE — Telephone Encounter (Signed)
Received call from [facility] in endo that patient was not cleaned out. Patient needs to be rescheduled. According to [facility], does not seem patient was given all of prep to be cleaned out. Dr. Gala Romney, does patient needs OV or can we just r/s him at next available? Thanks

## 2020-09-06 ENCOUNTER — Encounter (HOSPITAL_COMMUNITY): Payer: Self-pay

## 2020-09-06 ENCOUNTER — Other Ambulatory Visit: Payer: Self-pay

## 2020-09-06 ENCOUNTER — Emergency Department (HOSPITAL_COMMUNITY): Payer: Medicare (Managed Care)

## 2020-09-06 ENCOUNTER — Inpatient Hospital Stay (HOSPITAL_COMMUNITY)
Admission: EM | Admit: 2020-09-06 | Discharge: 2020-09-11 | DRG: 853 | Disposition: A | Discharge status: Skilled Nursing Facility | Payer: Medicare (Managed Care) | Attending: Internal Medicine | Admitting: Internal Medicine

## 2020-09-06 DIAGNOSIS — I639 Cerebral infarction, unspecified: Secondary | ICD-10-CM | POA: Diagnosis present

## 2020-09-06 DIAGNOSIS — E872 Acidosis, unspecified: Secondary | ICD-10-CM | POA: Diagnosis present

## 2020-09-06 DIAGNOSIS — R471 Dysarthria and anarthria: Secondary | ICD-10-CM | POA: Diagnosis present

## 2020-09-06 DIAGNOSIS — R7401 Elevation of levels of liver transaminase levels: Secondary | ICD-10-CM | POA: Diagnosis present

## 2020-09-06 DIAGNOSIS — E669 Obesity, unspecified: Secondary | ICD-10-CM | POA: Diagnosis present

## 2020-09-06 DIAGNOSIS — Z515 Encounter for palliative care: Secondary | ICD-10-CM | POA: Diagnosis not present

## 2020-09-06 DIAGNOSIS — J9600 Acute respiratory failure, unspecified whether with hypoxia or hypercapnia: Secondary | ICD-10-CM | POA: Diagnosis not present

## 2020-09-06 DIAGNOSIS — N136 Pyonephrosis: Secondary | ICD-10-CM | POA: Diagnosis present

## 2020-09-06 DIAGNOSIS — E8770 Fluid overload, unspecified: Secondary | ICD-10-CM | POA: Diagnosis present

## 2020-09-06 DIAGNOSIS — R509 Fever, unspecified: Secondary | ICD-10-CM | POA: Diagnosis not present

## 2020-09-06 DIAGNOSIS — H9190 Unspecified hearing loss, unspecified ear: Secondary | ICD-10-CM | POA: Diagnosis present

## 2020-09-06 DIAGNOSIS — E860 Dehydration: Secondary | ICD-10-CM | POA: Diagnosis present

## 2020-09-06 DIAGNOSIS — Z993 Dependence on wheelchair: Secondary | ICD-10-CM

## 2020-09-06 DIAGNOSIS — N111 Chronic obstructive pyelonephritis: Secondary | ICD-10-CM | POA: Diagnosis present

## 2020-09-06 DIAGNOSIS — E11649 Type 2 diabetes mellitus with hypoglycemia without coma: Secondary | ICD-10-CM | POA: Diagnosis not present

## 2020-09-06 DIAGNOSIS — Z888 Allergy status to other drugs, medicaments and biological substances status: Secondary | ICD-10-CM

## 2020-09-06 DIAGNOSIS — R29810 Facial weakness: Secondary | ICD-10-CM | POA: Diagnosis present

## 2020-09-06 DIAGNOSIS — Z6834 Body mass index (BMI) 34.0-34.9, adult: Secondary | ICD-10-CM

## 2020-09-06 DIAGNOSIS — Z9049 Acquired absence of other specified parts of digestive tract: Secondary | ICD-10-CM

## 2020-09-06 DIAGNOSIS — B961 Klebsiella pneumoniae [K. pneumoniae] as the cause of diseases classified elsewhere: Secondary | ICD-10-CM | POA: Diagnosis present

## 2020-09-06 DIAGNOSIS — Z85038 Personal history of other malignant neoplasm of large intestine: Secondary | ICD-10-CM

## 2020-09-06 DIAGNOSIS — I10 Essential (primary) hypertension: Secondary | ICD-10-CM | POA: Diagnosis present

## 2020-09-06 DIAGNOSIS — J9601 Acute respiratory failure with hypoxia: Secondary | ICD-10-CM | POA: Diagnosis present

## 2020-09-06 DIAGNOSIS — E441 Mild protein-calorie malnutrition: Secondary | ICD-10-CM | POA: Diagnosis present

## 2020-09-06 DIAGNOSIS — E876 Hypokalemia: Secondary | ICD-10-CM | POA: Diagnosis present

## 2020-09-06 DIAGNOSIS — R6521 Severe sepsis with septic shock: Secondary | ICD-10-CM | POA: Diagnosis present

## 2020-09-06 DIAGNOSIS — R945 Abnormal results of liver function studies: Secondary | ICD-10-CM | POA: Diagnosis not present

## 2020-09-06 DIAGNOSIS — L89302 Pressure ulcer of unspecified buttock, stage 2: Secondary | ICD-10-CM | POA: Diagnosis present

## 2020-09-06 DIAGNOSIS — Z825 Family history of asthma and other chronic lower respiratory diseases: Secondary | ICD-10-CM

## 2020-09-06 DIAGNOSIS — Z7984 Long term (current) use of oral hypoglycemic drugs: Secondary | ICD-10-CM

## 2020-09-06 DIAGNOSIS — N320 Bladder-neck obstruction: Secondary | ICD-10-CM | POA: Diagnosis present

## 2020-09-06 DIAGNOSIS — E785 Hyperlipidemia, unspecified: Secondary | ICD-10-CM | POA: Diagnosis present

## 2020-09-06 DIAGNOSIS — R532 Functional quadriplegia: Secondary | ICD-10-CM | POA: Diagnosis present

## 2020-09-06 DIAGNOSIS — R7989 Other specified abnormal findings of blood chemistry: Secondary | ICD-10-CM | POA: Diagnosis present

## 2020-09-06 DIAGNOSIS — Z87891 Personal history of nicotine dependence: Secondary | ICD-10-CM | POA: Diagnosis not present

## 2020-09-06 DIAGNOSIS — G510 Bell's palsy: Secondary | ICD-10-CM | POA: Diagnosis present

## 2020-09-06 DIAGNOSIS — R5381 Other malaise: Secondary | ICD-10-CM | POA: Diagnosis present

## 2020-09-06 DIAGNOSIS — F0390 Unspecified dementia without behavioral disturbance: Secondary | ICD-10-CM | POA: Diagnosis present

## 2020-09-06 DIAGNOSIS — G9341 Metabolic encephalopathy: Secondary | ICD-10-CM | POA: Diagnosis not present

## 2020-09-06 DIAGNOSIS — R7881 Bacteremia: Secondary | ICD-10-CM | POA: Diagnosis present

## 2020-09-06 DIAGNOSIS — E119 Type 2 diabetes mellitus without complications: Secondary | ICD-10-CM | POA: Diagnosis present

## 2020-09-06 DIAGNOSIS — N170 Acute kidney failure with tubular necrosis: Secondary | ICD-10-CM | POA: Diagnosis present

## 2020-09-06 DIAGNOSIS — Z419 Encounter for procedure for purposes other than remedying health state, unspecified: Secondary | ICD-10-CM

## 2020-09-06 DIAGNOSIS — Z809 Family history of malignant neoplasm, unspecified: Secondary | ICD-10-CM

## 2020-09-06 DIAGNOSIS — G9340 Encephalopathy, unspecified: Secondary | ICD-10-CM

## 2020-09-06 DIAGNOSIS — Z20822 Contact with and (suspected) exposure to covid-19: Secondary | ICD-10-CM | POA: Diagnosis present

## 2020-09-06 DIAGNOSIS — A4159 Other Gram-negative sepsis: Secondary | ICD-10-CM | POA: Diagnosis present

## 2020-09-06 DIAGNOSIS — R1310 Dysphagia, unspecified: Secondary | ICD-10-CM | POA: Diagnosis present

## 2020-09-06 DIAGNOSIS — G928 Other toxic encephalopathy: Secondary | ICD-10-CM | POA: Diagnosis present

## 2020-09-06 DIAGNOSIS — N202 Calculus of kidney with calculus of ureter: Secondary | ICD-10-CM | POA: Diagnosis present

## 2020-09-06 DIAGNOSIS — Z79899 Other long term (current) drug therapy: Secondary | ICD-10-CM

## 2020-09-06 DIAGNOSIS — N179 Acute kidney failure, unspecified: Secondary | ICD-10-CM | POA: Diagnosis not present

## 2020-09-06 DIAGNOSIS — Z885 Allergy status to narcotic agent status: Secondary | ICD-10-CM

## 2020-09-06 DIAGNOSIS — R6520 Severe sepsis without septic shock: Secondary | ICD-10-CM

## 2020-09-06 DIAGNOSIS — A419 Sepsis, unspecified organism: Secondary | ICD-10-CM | POA: Diagnosis present

## 2020-09-06 DIAGNOSIS — D649 Anemia, unspecified: Secondary | ICD-10-CM | POA: Diagnosis not present

## 2020-09-06 DIAGNOSIS — L8990 Pressure ulcer of unspecified site, unspecified stage: Secondary | ICD-10-CM | POA: Insufficient documentation

## 2020-09-06 DIAGNOSIS — Z7189 Other specified counseling: Secondary | ICD-10-CM | POA: Diagnosis not present

## 2020-09-06 DIAGNOSIS — D6959 Other secondary thrombocytopenia: Secondary | ICD-10-CM | POA: Diagnosis present

## 2020-09-06 DIAGNOSIS — F79 Unspecified intellectual disabilities: Secondary | ICD-10-CM | POA: Diagnosis present

## 2020-09-06 LAB — RAPID URINE DRUG SCREEN, HOSP PERFORMED
Amphetamines: NOT DETECTED
Barbiturates: NOT DETECTED
Benzodiazepines: NOT DETECTED
Cocaine: NOT DETECTED
Opiates: NOT DETECTED
Tetrahydrocannabinol: NOT DETECTED

## 2020-09-06 LAB — URINALYSIS, ROUTINE W REFLEX MICROSCOPIC
Bilirubin Urine: NEGATIVE
Glucose, UA: NEGATIVE mg/dL
Ketones, ur: NEGATIVE mg/dL
Nitrite: NEGATIVE
Protein, ur: 30 mg/dL — AB
Specific Gravity, Urine: 1.023 (ref 1.005–1.030)
pH: 5 (ref 5.0–8.0)

## 2020-09-06 LAB — RESP PANEL BY RT-PCR (FLU A&B, COVID) ARPGX2
Influenza A by PCR: NEGATIVE
Influenza B by PCR: NEGATIVE
SARS Coronavirus 2 by RT PCR: NEGATIVE

## 2020-09-06 LAB — DIFFERENTIAL
Abs Immature Granulocytes: 0.07 10*3/uL (ref 0.00–0.07)
Basophils Absolute: 0 10*3/uL (ref 0.0–0.1)
Basophils Relative: 1 %
Eosinophils Absolute: 0 10*3/uL (ref 0.0–0.5)
Eosinophils Relative: 0 %
Immature Granulocytes: 2 %
Lymphocytes Relative: 9 %
Lymphs Abs: 0.4 10*3/uL — ABNORMAL LOW (ref 0.7–4.0)
Monocytes Absolute: 0.1 10*3/uL (ref 0.1–1.0)
Monocytes Relative: 2 %
Neutro Abs: 4 10*3/uL (ref 1.7–7.7)
Neutrophils Relative %: 86 %

## 2020-09-06 LAB — BLOOD GAS, VENOUS
Acid-base deficit: 4.7 mmol/L — ABNORMAL HIGH (ref 0.0–2.0)
Bicarbonate: 21.7 mmol/L (ref 20.0–28.0)
FIO2: 21
O2 Saturation: 95.8 %
Patient temperature: 40.8
pCO2, Ven: 32 mmHg — ABNORMAL LOW (ref 44.0–60.0)
pH, Ven: 7.401 (ref 7.250–7.430)
pO2, Ven: 103 mmHg — ABNORMAL HIGH (ref 32.0–45.0)

## 2020-09-06 LAB — I-STAT CHEM 8, ED
BUN: 19 mg/dL (ref 8–23)
Calcium, Ion: 1.02 mmol/L — ABNORMAL LOW (ref 1.15–1.40)
Chloride: 104 mmol/L (ref 98–111)
Creatinine, Ser: 1.3 mg/dL — ABNORMAL HIGH (ref 0.61–1.24)
Glucose, Bld: 179 mg/dL — ABNORMAL HIGH (ref 70–99)
HCT: 49 % (ref 39.0–52.0)
Hemoglobin: 16.7 g/dL (ref 13.0–17.0)
Potassium: 4.4 mmol/L (ref 3.5–5.1)
Sodium: 142 mmol/L (ref 135–145)
TCO2: 18 mmol/L — ABNORMAL LOW (ref 22–32)

## 2020-09-06 LAB — CBC
HCT: 49 % (ref 39.0–52.0)
Hemoglobin: 15.8 g/dL (ref 13.0–17.0)
MCH: 30 pg (ref 26.0–34.0)
MCHC: 32.2 g/dL (ref 30.0–36.0)
MCV: 93 fL (ref 80.0–100.0)
Platelets: 214 10*3/uL (ref 150–400)
RBC: 5.27 MIL/uL (ref 4.22–5.81)
RDW: 13.3 % (ref 11.5–15.5)
WBC: 4.5 10*3/uL (ref 4.0–10.5)
nRBC: 0 % (ref 0.0–0.2)

## 2020-09-06 LAB — COMPREHENSIVE METABOLIC PANEL
ALT: 44 U/L (ref 0–44)
AST: 38 U/L (ref 15–41)
Albumin: 3.4 g/dL — ABNORMAL LOW (ref 3.5–5.0)
Alkaline Phosphatase: 138 U/L — ABNORMAL HIGH (ref 38–126)
Anion gap: 20 — ABNORMAL HIGH (ref 5–15)
BUN: 20 mg/dL (ref 8–23)
CO2: 18 mmol/L — ABNORMAL LOW (ref 22–32)
Calcium: 8.8 mg/dL — ABNORMAL LOW (ref 8.9–10.3)
Chloride: 100 mmol/L (ref 98–111)
Creatinine, Ser: 1.53 mg/dL — ABNORMAL HIGH (ref 0.61–1.24)
GFR, Estimated: 47 mL/min — ABNORMAL LOW (ref >60)
Glucose, Bld: 186 mg/dL — ABNORMAL HIGH (ref 70–99)
Potassium: 4.3 mmol/L (ref 3.5–5.1)
Sodium: 138 mmol/L (ref 135–145)
Total Bilirubin: 1.5 mg/dL — ABNORMAL HIGH (ref 0.3–1.2)
Total Protein: 8.1 g/dL (ref 6.5–8.1)

## 2020-09-06 LAB — CSF CELL COUNT WITH DIFFERENTIAL
RBC Count, CSF: 2 /mm3 — ABNORMAL HIGH
Tube #: 4
WBC, CSF: 3 /mm3 (ref 0–5)

## 2020-09-06 LAB — CBG MONITORING, ED
Glucose-Capillary: 106 mg/dL — ABNORMAL HIGH (ref 70–99)
Glucose-Capillary: 176 mg/dL — ABNORMAL HIGH (ref 70–99)

## 2020-09-06 LAB — PROTEIN AND GLUCOSE, CSF
Glucose, CSF: 94 mg/dL — ABNORMAL HIGH (ref 40–70)
Total  Protein, CSF: 155 mg/dL — ABNORMAL HIGH (ref 15–45)

## 2020-09-06 LAB — ETHANOL: Alcohol, Ethyl (B): <10 mg/dL (ref <10)

## 2020-09-06 LAB — APTT: aPTT: 29 seconds (ref 24–36)

## 2020-09-06 LAB — PROTIME-INR
INR: 1.2 (ref 0.8–1.2)
Prothrombin Time: 14.3 seconds (ref 11.4–15.2)

## 2020-09-06 LAB — LACTIC ACID, PLASMA
Lactic Acid, Venous: 9.6 mmol/L (ref 0.5–1.9)
Lactic Acid, Venous: 8.9 mmol/L (ref 0.5–1.9)

## 2020-09-06 MED ORDER — ACETAMINOPHEN 325 MG PO TABS
650.0000 mg | ORAL_TABLET | Freq: Once | ORAL | Status: DC
  Filled 2020-09-06: qty 2

## 2020-09-06 MED ORDER — DEXTROSE 5 % IV SOLN
10.0000 mg/kg | Freq: Three times a day (TID) | INTRAVENOUS | Status: DC
  Administered 2020-09-06: 22:00:00 850 mg via INTRAVENOUS
  Filled 2020-09-06 (×7): qty 17

## 2020-09-06 MED ORDER — NOREPINEPHRINE 4 MG/250ML-% IV SOLN
2.0000 ug/min | INTRAVENOUS | Status: DC
  Administered 2020-09-06 – 2020-09-07 (×2): 2 ug/min via INTRAVENOUS
  Administered 2020-09-07: 09:00:00 10 ug/min via INTRAVENOUS
  Administered 2020-09-07: 02:00:00 9 ug/min via INTRAVENOUS
  Filled 2020-09-06 (×5): qty 250

## 2020-09-06 MED ORDER — ONDANSETRON HCL 4 MG PO TABS: 4.0000 mg | ORAL_TABLET | Freq: Four times a day (QID) | ORAL | Status: DC | PRN

## 2020-09-06 MED ORDER — LIDOCAINE-EPINEPHRINE (PF) 2 %-1:200000 IJ SOLN
20.0000 mL | Freq: Once | INTRAMUSCULAR | Status: AC
  Administered 2020-09-06: 20 mL via INTRADERMAL
  Filled 2020-09-06: qty 20

## 2020-09-06 MED ORDER — FERROUS SULFATE 325 (65 FE) MG PO TABS
325.0000 mg | ORAL_TABLET | Freq: Every day | ORAL | Status: DC
  Administered 2020-09-09 – 2020-09-11 (×3): 325 mg via ORAL
  Filled 2020-09-06 (×3): qty 1

## 2020-09-06 MED ORDER — INSULIN ASPART 100 UNIT/ML ~~LOC~~ SOLN: 0.0000 [IU] | Freq: Every day | SUBCUTANEOUS | Status: DC

## 2020-09-06 MED ORDER — ADULT MULTIVITAMIN W/MINERALS CH
1.0000 | ORAL_TABLET | Freq: Every day | ORAL | Status: DC
  Administered 2020-09-09 – 2020-09-11 (×3): 1 via ORAL
  Filled 2020-09-06 (×3): qty 1

## 2020-09-06 MED ORDER — ACETAMINOPHEN 325 MG PO TABS
650.0000 mg | ORAL_TABLET | Freq: Four times a day (QID) | ORAL | Status: DC | PRN
  Administered 2020-09-10: 650 mg via ORAL
  Filled 2020-09-06: qty 2

## 2020-09-06 MED ORDER — VANCOMYCIN HCL 1250 MG/250ML IV SOLN
1250.0000 mg | INTRAVENOUS | Status: DC
  Filled 2020-09-06: qty 250

## 2020-09-06 MED ORDER — ONDANSETRON HCL 4 MG/2ML IJ SOLN: 4.0000 mg | Freq: Four times a day (QID) | INTRAMUSCULAR | Status: DC | PRN

## 2020-09-06 MED ORDER — HEPARIN SODIUM (PORCINE) 5000 UNIT/ML IJ SOLN
5000.0000 [IU] | Freq: Three times a day (TID) | INTRAMUSCULAR | Status: DC
  Administered 2020-09-07 (×2): 5000 [IU] via SUBCUTANEOUS
  Filled 2020-09-06 (×2): qty 1

## 2020-09-06 MED ORDER — LACTATED RINGERS IV BOLUS (SEPSIS)
1000.0000 mL | Freq: Once | INTRAVENOUS | Status: AC
  Administered 2020-09-06: 11:00:00 1000 mL via INTRAVENOUS

## 2020-09-06 MED ORDER — LACTATED RINGERS IV BOLUS (SEPSIS)
500.0000 mL | Freq: Once | INTRAVENOUS | Status: AC
  Administered 2020-09-06: 11:00:00 500 mL via INTRAVENOUS

## 2020-09-06 MED ORDER — ACYCLOVIR SODIUM 50 MG/ML IV SOLN
INTRAVENOUS | Status: AC
  Filled 2020-09-06: qty 10

## 2020-09-06 MED ORDER — OXYCODONE HCL 5 MG PO TABS: 5.0000 mg | ORAL_TABLET | ORAL | Status: DC | PRN

## 2020-09-06 MED ORDER — ATORVASTATIN CALCIUM 10 MG PO TABS
5.0000 mg | ORAL_TABLET | Freq: Every day | ORAL | Status: DC
  Administered 2020-09-08 – 2020-09-11 (×4): 5 mg via ORAL
  Filled 2020-09-06 (×5): qty 1

## 2020-09-06 MED ORDER — SODIUM CHLORIDE 0.9 % IV SOLN
250.0000 mL | INTRAVENOUS | Status: DC
  Administered 2020-09-07: 12:00:00 250 mL via INTRAVENOUS

## 2020-09-06 MED ORDER — VANCOMYCIN HCL 2000 MG/400ML IV SOLN
2000.0000 mg | Freq: Once | INTRAVENOUS | Status: AC
  Administered 2020-09-06: 11:00:00 2000 mg via INTRAVENOUS
  Filled 2020-09-06: qty 400

## 2020-09-06 MED ORDER — VANCOMYCIN HCL IN DEXTROSE 1-5 GM/200ML-% IV SOLN: 1000.0000 mg | Freq: Once | INTRAVENOUS | Status: DC

## 2020-09-06 MED ORDER — LACTATED RINGERS IV SOLN: INTRAVENOUS | Status: DC

## 2020-09-06 MED ORDER — METRONIDAZOLE IN NACL 5-0.79 MG/ML-% IV SOLN
500.0000 mg | Freq: Once | INTRAVENOUS | Status: AC
  Administered 2020-09-06: 11:00:00 500 mg via INTRAVENOUS
  Filled 2020-09-06: qty 100

## 2020-09-06 MED ORDER — SODIUM CHLORIDE 0.9 % IV SOLN
2.0000 g | Freq: Once | INTRAVENOUS | Status: AC
  Administered 2020-09-06: 11:00:00 2 g via INTRAVENOUS
  Filled 2020-09-06: qty 2

## 2020-09-06 MED ORDER — ACETAMINOPHEN 650 MG RE SUPP: 650.0000 mg | Freq: Four times a day (QID) | RECTAL | Status: DC | PRN

## 2020-09-06 MED ORDER — SODIUM CHLORIDE 0.9 % IV SOLN
2.0000 g | Freq: Two times a day (BID) | INTRAVENOUS | Status: DC
  Administered 2020-09-07: 01:00:00 2 g via INTRAVENOUS
  Filled 2020-09-06: qty 2

## 2020-09-06 MED ORDER — INSULIN ASPART 100 UNIT/ML ~~LOC~~ SOLN
0.0000 [IU] | Freq: Three times a day (TID) | SUBCUTANEOUS | Status: DC
  Administered 2020-09-09: 18:00:00 2 [IU] via SUBCUTANEOUS
  Administered 2020-09-10: 08:00:00 3 [IU] via SUBCUTANEOUS
  Administered 2020-09-10: 17:00:00 2 [IU] via SUBCUTANEOUS
  Administered 2020-09-10: 12:00:00 3 [IU] via SUBCUTANEOUS
  Administered 2020-09-11 (×2): 2 [IU] via SUBCUTANEOUS

## 2020-09-06 MED ORDER — ACETAMINOPHEN 650 MG RE SUPP
650.0000 mg | Freq: Once | RECTAL | Status: AC
  Administered 2020-09-06: 17:00:00 650 mg via RECTAL
  Filled 2020-09-06: qty 1

## 2020-09-06 MED ORDER — SODIUM CHLORIDE 0.9 % IV BOLUS
500.0000 mL | Freq: Once | INTRAVENOUS | Status: AC
  Administered 2020-09-06: 14:00:00 500 mL via INTRAVENOUS

## 2020-09-06 MED ORDER — ACETAMINOPHEN 650 MG RE SUPP
650.0000 mg | Freq: Once | RECTAL | Status: AC
  Administered 2020-09-06: 11:00:00 650 mg via RECTAL
  Filled 2020-09-06: qty 1

## 2020-09-06 NOTE — ED Triage Notes (Signed)
Pt brought to ED via [facility] for Code Stroke. Pt with slurred speech, unable to get words out and posturing noted at 0945. LKW [id number]

## 2020-09-06 NOTE — Progress Notes (Signed)
Pharmacy Antibiotic Note  [name] is a 74 y.o. male admitted on 09/06/2020 with unknown source/ herpes encephalits.  Pharmacy has been consulted for Acyclovir, Vancomycin and cefepime dosing.  Plan: Acyclovir 10mg /kg 850mg  (ABW) IV q8h Vancomycin 2000mg  IV loading dose, then 1250mg   IV every 24 hours.  Goal trough 15-20 mcg/mL.  Cefepime 2gm IV q12h F/u cxs and clinical progress Monitor V/S, labs, and levels as indicated  Weight: 107 kg (236 lb)  Temp (24hrs), Avg:103.1 F (39.5 C), Min:98.4 F (36.9 C), Max:105.5 F (40.8 C)  Recent Labs  Lab 09/06/20 1014 09/06/20 1045 09/06/20 1048 09/06/20 1625  WBC 4.5  --   --   --   CREATININE 1.53*  --  1.30*  --   LATICACIDVEN  --  9.6*  --  8.9*    Estimated Creatinine Clearance: 59.2 mL/min (A) (by C-G formula based on SCr of 1.3 mg/dL (H)).   Normalized CrCL is 62mls/min  Allergies  Allergen Reactions  . Hydrocodone   . Hydrocodone-Acetaminophen     unknown  . Aplisol [Tuberculin Ppd] Rash    Antimicrobials this admission: Vancomycin 12/11>>   Cefepime 12/11 >>  Acyclovir 12/11>>  Dose adjustments this admission: prn  Microbiology results: 12/11 BCx: pending 12/11 UCx: pending  MRSA PCR:   Thank you for allowing pharmacy to be a part of this patient's care.  Isac Sarna, BS [place], [place] Clinical Pharmacist Pager [phone] 09/06/2020 8:44 PM

## 2020-09-06 NOTE — ED Notes (Signed)
Pt in bed, pt had bent his arm and occluded his IV, explained that he would need to keep his arm straight, restarted norepi infusion.  Pt's pressure now 95/53

## 2020-09-06 NOTE — H&P (Signed)
TRH H&P    Patient Demographics:    [name], is a 74 y.o. male  MRN: [id number]  DOB - [birth year]  Admit Date - 09/06/2020  Referring MD/NP/PA: Miller/Butler  Outpatient Primary MD for the patient is Caprice Renshaw, MD  Patient coming from: SNF   Chief complaint- Altered Mental status   HPI:    [name]  is a 75 y.o. male, with history of hypertension, hyperlipidemia, dementia, Bell's palsy, presents to the ED with a chief complaint of altered mental status.  Unfortunately patient is not able to offer history.  He states that he does not know why he is here.  He is not in any pain right at this moment.  He reports that he does not feel nauseous or have any complaints at this time.  He is oriented only to self.  Chart review reveals the patient's last known well was 8:20 AM.  He presented to the ER around 10:17 AM.  EMS reported that patient was coming from his SNF, he had altered mental status and they thought he had decorticate posturing as well.  They reported that he was not able to answer questions, they put him on a nonrebreather and transported him to the ER.  Code stroke was called, neurology saw this patient and determined that he was encephalopathic, but he was not aphasic out of proportion to his encephalopathy.  They noted him to have symmetric facial grimace to pain at that time, and he was noted to grimace to palpation of all extremities at that time.  Their impression was that he would need MRI brain with and without contrast to rule out CNS metastasis, infection.  Dr. Melina Copa did an LP that revealed 155 protein, 94 glucose, 2 red blood cells and 3 white blood cells.  UDS was negative.  UA was significant for UTI with a white blood cell count of 11-20, rare bacteria, large leukocytes.  Urine culture pending.  CT head was done that showed no acute intracranial process.  Chronic basal ganglia/white matter  calcifications are unchanged.  Code sepsis was called, patient was started on Vanco, cefepime, Flagyl.  His SIRS criteria were a temperature of 105.5, heart rate of 131, respiratory rate of 36, blood pressure 72/30, satting at 94%.  Patient was given 3.5 L bolus, and blood pressure did not adequately improve, he was started on Levophed.  Critical care was consulted that advised patient would be safe to stay at [facility].  Neurology was consulted that agreed with telemetry neuro that he would need an MRI.  Since any pen will not have MRI available until Monday, to expedite care in this critically ill patient we will transfer him to [facility].  Of the results in the ED Lactic acid initially 9.6 and improved to 8.9 after bolus. Mentation improved and patient is now alert but still only oriented to self after bolus and antibiotics were started. White blood cell count 4.5.  Chemistry panel reveals a decreased bicarb 18, BUN of 28, creatinine of 1.53 Negative Covid and flu  CSF culture, blood culture, urine culture pending    Review of systems:    Review of Systems  Unable to perform ROS: Mental status change      Past History of the following :    Past Medical History:  Diagnosis Date  . Bell's palsy   . Bleeding ulcer    remote past  . Dementia    disoriented to time.does not know medical hx.information obtained from previous charts  . Hard of hearing   . Hyperlipemia   . Hypertension   . Obesity   . SOB (shortness of breath) on exertion       Past Surgical History:  Procedure Laterality Date  . BIOPSY  05/09/2019   Procedure: BIOPSY;  Surgeon: Daneil Dolin, MD;  Location: [facility];  Service: Endoscopy;;  . COLONOSCOPY  10/06/06   internal hemorrhoids and anal papilla/poor prep  . 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 ESOPHAGOGASTRODUODENOSCOPY N/A 05/01/2018   multiple 3-6 mm gastric nodules and gastritis, surrounding telangectasias. Biopsy with  atrophic gastritis.   Marland Kitchen FOOT SURGERY    . KNEE ARTHROSCOPY  06/17/2011   Procedure: ARTHROSCOPY KNEE;  Surgeon: Sanjuana Kava;  Location: [facility] OR[facility];  Service: Orthopedics;  Laterality: Right;  . LAPAROSCOPIC PARTIAL COLECTOMY N/A 05/11/2019   Procedure: ATTEMPTED LAPAROSCOPIC PARTIAL COLECTOMY;  Surgeon: [name], MD;  Location: [facility] ORS;  Service: General;  Laterality: N/A;  . PARTIAL COLECTOMY  05/11/2019   Procedure: PARTIAL COLECTOMY (Laparotomy incision at 0867;  Surgeon: [name], MD;  Location: [facility];  Service: General;;  . POLYPECTOMY  05/09/2019   Procedure: POLYPECTOMY;  Surgeon: Daneil Dolin, MD;  Location: [facility];  Service: Endoscopy;;      Social History:      Social History   Tobacco Use  . Smoking status: Former Smoker    Packs/day: 1.00    Years: 30.00    Pack years: 30.00    Types: Cigarettes  . Smokeless tobacco: Never Used  . Tobacco comment: quit a "long time ago"   Substance Use Topics  . Alcohol use: No       Family History :     Family History  Problem Relation Age of Onset  . COPD Mother   . Cancer Brother   . Anesthesia problems Neg Hx   . Hypotension Neg Hx   . Malignant hyperthermia Neg Hx   . Pseudochol deficiency Neg Hx   . Colon cancer Neg Hx      Home Medications:   Prior to Admission medications   Medication Sig Start Date End Date Taking? Authorizing Provider  acetaminophen (TYLENOL) 650 MG CR tablet Take 650 mg by mouth 3 (three) times daily.   Yes [provider]  atenolol (TENORMIN) 25 MG tablet Take 25 mg by mouth daily.    Yes [provider]  atorvastatin (LIPITOR) 10 MG tablet Take 5 mg by mouth at bedtime.    Yes [provider]  carboxymethylcellulose (REFRESH PLUS) 0.5 % SOLN Place 1 drop into both eyes every 6 (six) hours as needed (Dry eye).    Yes [provider]  cholecalciferol (VITAMIN D3) 25 MCG (1000 UNIT) tablet Take 1,000 Units by mouth daily.   Yes  [provider]  Colloidal Oatmeal (EUCERIN ECZEMA RELIEF) 1 % CREA Apply 1 application topically 2 (two) times daily. Apply to face and neck   Yes [provider]  cycloSPORINE (RESTASIS) 0.05 %  ophthalmic emulsion Place 1 drop into both eyes 2 (two) times daily.   Yes [provider]  ferrous sulfate 325 (65 FE) MG EC tablet Take 325 mg by mouth daily at 12 noon.   Yes [provider]  furosemide (LASIX) 40 MG tablet Take 40 mg by mouth daily.   Yes [provider]  ketoconazole (NIZORAL) 2 % shampoo Apply 1 application topically every 12 (twelve) hours as needed for irritation. Apply to Scalp   Yes [provider]  metFORMIN (GLUCOPHAGE) 500 MG tablet Take 500 mg by mouth at bedtime.   Yes [provider]  mometasone (ELOCON) 0.1 % cream Apply 1 application topically daily as needed (Dry skin).    Yes [provider]  Multiple Vitamin (MULTIVITAMIN) tablet Take 1 tablet by mouth daily.   Yes [provider]  Omega-3 Fatty Acids (FISH OIL) 1000 MG CAPS Take 2,000 mg by mouth 2 (two) times daily.    Yes [provider]  potassium chloride (KLOR-CON) 20 MEQ packet Take 20 mEq by mouth 2 (two) times daily.    Yes [provider]  Skin Protectants, Misc. (EUCERIN) cream Apply 1 application topically daily. Apply to feet   Yes [provider]  vitamin B-12 (CYANOCOBALAMIN) 1000 MCG tablet Take 1,000 mcg by mouth daily.   Yes [provider]  linaclotide (LINZESS) 290 MCG CAPS capsule Take 1 capsule (290 mcg total) by mouth daily before breakfast. Start 5 days prior to bowel prep Patient not taking: No sig reported 08/26/20   Rourk, Cristopher Estimable, MD  polyethylene glycol-electrolytes (NULYTELY) 420 g solution As directed Patient not taking: No sig reported 08/26/20   Rourk, Cristopher Estimable, MD     Allergies:     Allergies  Allergen Reactions  . Hydrocodone   . Hydrocodone-Acetaminophen      unknown  . Aplisol [Tuberculin Ppd] Rash     Physical Exam:   Vitals  Blood pressure (!) 91/53, pulse 97, temperature 98.4 F (36.9 C), temperature source Oral, resp. rate (!) 26, weight 107 kg, SpO2 94 %.  1.  General: Patient lying supine in bed in no acute distress  2. Psychiatric: Patient oriented only to self, cooperative with exam, hard of hearing  3. Neurologic: Follows commands, oriented only to self, face is symmetric, speech is normal, overall mentation improving, no focal deficit on limited exam  4. HEENMT:  Head is atraumatic, normocephalic, pupils reactive to light, neck is supple, mucous membranes are mildly dry, trachea is midline  5. Respiratory : Lungs are clear to auscultation bilaterally without wheeze, rhonchi, rales, mildly tachypneic  6. Cardiovascular : Heart rate remains tachycardic, rhythm is regular, no murmurs rubs or gallops, no peripheral edema  7. Gastrointestinal:  Abdomen is soft, nondistended, nontender to palpation  8. Skin:  Skin is warm dry and intact  9.Musculoskeletal:  No acute deformities however are chronic contractures in his lower extremities, no calf tenderness    Data Review:    CBC Recent Labs  Lab 09/06/20 1014 09/06/20 1048  WBC 4.5  --   HGB 15.8 16.7  HCT 49.0 49.0  PLT 214  --   MCV 93.0  --   MCH 30.0  --   MCHC 32.2  --   RDW 13.3  --   LYMPHSABS 0.4*  --   MONOABS 0.1  --   EOSABS 0.0  --   BASOSABS 0.0  --    ------------------------------------------------------------------------------------------------------------------  Results for orders placed or performed  during the hospital encounter of 09/06/20 (from the past 48 hour(s))  Ethanol     Status: None   Collection Time: 09/06/20 10:14 AM  Result Value Ref Range   Alcohol, Ethyl (B) <10 <10 mg/dL    Comment: (NOTE) Lowest detectable limit for serum alcohol is 10 mg/dL.  For medical purposes only. Performed at [facility], [address], [place], [place] [address]   Protime-INR     Status: None   Collection Time: 09/06/20 10:14 AM  Result Value Ref Range   Prothrombin Time 14.3 11.4 - 15.2 seconds   INR 1.2 0.8 - 1.2    Comment: (NOTE) INR goal varies based on device and disease states. Performed at [facility], [address], [place], [place] [address]   APTT     Status: None   Collection Time: 09/06/20 10:14 AM  Result Value Ref Range   aPTT 29 24 - 36 seconds    Comment: Performed at [facility], [address] [place], [place] [address]  CBC     Status: None   Collection Time: 09/06/20 10:14 AM  Result Value Ref Range   WBC 4.5 4.0 - 10.5 K/uL   RBC 5.27 4.22 - 5.81 MIL/uL   Hemoglobin 15.8 13.0 - 17.0 g/dL   HCT 49.0 39.0 - 52.0 %   MCV 93.0 80.0 - 100.0 fL   MCH 30.0 26.0 - 34.0 pg   MCHC 32.2 30.0 - 36.0 g/dL   RDW 13.3 11.5 - 15.5 %   Platelets 214 150 - 400 K/uL   nRBC 0.0 0.0 - 0.2 %    Comment: Performed at [facility], [address], [place], [place] [address]  Differential     Status: Abnormal   Collection Time: 09/06/20 10:14 AM  Result Value Ref Range   Neutrophils Relative % 86 %   Neutro Abs 4.0 1.7 - 7.7 K/uL   Lymphocytes Relative 9 %   Lymphs Abs 0.4 (L) 0.7 - 4.0 K/uL   Monocytes Relative 2 %   Monocytes Absolute 0.1 0.1 - 1.0 K/uL   Eosinophils Relative 0 %   Eosinophils Absolute 0.0 0.0 - 0.5 K/uL   Basophils Relative 1 %   Basophils Absolute 0.0 0.0 - 0.1 K/uL   Immature Granulocytes 2 %   Abs Immature Granulocytes 0.07 0.00 - 0.07 K/uL   Abnormal Lymphocytes Present PRESENT     Comment: Performed at [facility], [address] [place],  [address]  Comprehensive metabolic panel     Status: Abnormal   Collection Time: 09/06/20 10:14 AM  Result Value Ref Range   Sodium 138 135 - 145 mmol/L   Potassium 4.3 3.5 - 5.1 mmol/L   Chloride 100 98 - 111 mmol/L   CO2 18 (L) 22 - 32 mmol/L   Glucose, Bld 186 (H) 70 - 99 mg/dL    Comment: Glucose reference range applies only  to samples taken after fasting for at least 8 hours.   BUN 20 8 - 23 mg/dL   Creatinine, Ser 1.53 (H) 0.61 - 1.24 mg/dL   Calcium 8.8 (L) 8.9 - 10.3 mg/dL   Total Protein 8.1 6.5 - 8.1 g/dL   Albumin 3.4 (L) 3.5 - 5.0 g/dL   AST 38 15 - 41 U/L   ALT 44 0 - 44 U/L    Comment: RESULTS CONFIRMED BY MANUAL DILUTION   Alkaline Phosphatase 138 (H) 38 - 126 U/L   Total Bilirubin 1.5 (H) 0.3 - 1.2  mg/dL   GFR, Estimated 47 (L) >60 mL/min    Comment: (NOTE) Calculated using the CKD-EPI Creatinine Equation (2021)    Anion gap 20 (H) 5 - 15    Comment: Performed at [facility], [address] [place], [place] [address]  CBG monitoring, ED     Status: Abnormal   Collection Time: 09/06/20 10:28 AM  Result Value Ref Range   Glucose-Capillary 176 (H) 70 - 99 mg/dL    Comment: Glucose reference range applies only to samples taken after fasting for at least 8 hours.  Lactic acid, plasma     Status: Abnormal   Collection Time: 09/06/20 10:45 AM  Result Value Ref Range   Lactic Acid, Venous 9.6 (HH) 0.5 - 1.9 mmol/L    Comment: CRITICAL RESULT CALLED TO, READ BACK BY AND VERIFIED WITH: GENTRY R. @ 1123 ON [id number] BY HENDERSON L. Performed at [facility], [address] [place], [place] [address]   Ginger Carne 8, ED     Status: Abnormal   Collection Time: 09/06/20 10:48 AM  Result Value Ref Range   Sodium 142 135 - 145 mmol/L   Potassium 4.4 3.5 - 5.1 mmol/L   Chloride 104 98 - 111 mmol/L   BUN 19 8 - 23 mg/dL   Creatinine, Ser 1.30 (H) 0.61 - 1.24 mg/dL   Glucose, Bld 179 (H) 70 - 99 mg/dL    Comment: Glucose reference range applies only to samples taken after fasting for at least 8 hours.   Calcium, Ion 1.02 (L) 1.15 - 1.40 mmol/L   TCO2 18 (L) 22 - 32 mmol/L   Hemoglobin 16.7 13.0 - 17.0 g/dL   HCT 49.0 39.0 - 52.0 %  Culture, blood (routine x 2)     Status: None (Preliminary result)   Collection Time: 09/06/20 11:04 AM   Specimen: BLOOD RIGHT HAND  Result Value Ref Range   Specimen  Description      BLOOD RIGHT HAND BOTTLES DRAWN AEROBIC AND ANAEROBIC   Special Requests      Blood Culture adequate volume Performed at [facility], [address] [place], [place] [address]    Culture PENDING    Report Status PENDING   Blood gas, venous     Status: Abnormal   Collection Time: 09/06/20 11:10 AM  Result Value Ref Range   FIO2 21.00    pH, Ven 7.401 7.250 - 7.430   pCO2, Ven 32.0 (L) 44.0 - 60.0 mmHg   pO2, Ven 103.0 (H) 32.0 - 45.0 mmHg   Bicarbonate 21.7 20.0 - 28.0 mmol/L   Acid-base deficit 4.7 (H) 0.0 - 2.0 mmol/L   O2 Saturation 95.8 %   Patient temperature 40.8     Comment: Performed at [facility], [address] [place], [place] [address]  Culture, blood (routine x 2)     Status: None (Preliminary result)   Collection Time: 09/06/20 11:11 AM   Specimen: Left Antecubital; Blood  Result Value Ref Range   Specimen Description      LEFT ANTECUBITAL BOTTLES DRAWN AEROBIC AND ANAEROBIC   Special Requests      Blood Culture adequate volume Performed at [facility], [address], [place], [place] [address]    Culture PENDING    Report Status PENDING   Resp Panel by RT-PCR (Flu A&B, Covid) Nasopharyngeal Swab     Status: None   Collection Time: 09/06/20 11:13 AM   Specimen: Nasopharyngeal Swab; Nasopharyngeal(NP) swabs in vial transport medium  Result Value  Ref Range   SARS Coronavirus 2 by RT PCR NEGATIVE NEGATIVE    Comment: (NOTE) SARS-CoV-2 target nucleic acids are NOT DETECTED.  The SARS-CoV-2 RNA is generally detectable in upper respiratory specimens during the acute phase of infection. The lowest concentration of SARS-CoV-2 viral copies this assay can detect is 138 copies/mL. A negative result does not preclude SARS-Cov-2 infection and should not be used as the sole basis for treatment or other patient management decisions. A negative result may occur with  improper specimen collection/handling, submission of specimen other than nasopharyngeal  swab, presence of viral mutation(s) within the areas targeted by this assay, and inadequate number of viral copies(<138 copies/mL). A negative result must be combined with clinical observations, patient history, and epidemiological information. The expected result is Negative.  Fact Sheet for Patients:  EntrepreneurPulse.com.au  Fact Sheet for Healthcare Providers:  IncredibleEmployment.be  This test is no t yet approved or cleared by the [place] FDA and  has been authorized for detection and/or diagnosis of SARS-CoV-2 by FDA under an Emergency Use Authorization (EUA). This EUA will remain  in effect (meaning this test can be used) for the duration of the COVID-19 declaration under Section 564(b)(1) of the Act, 21 U.S.C.section 360bbb-3(b)(1), unless the authorization is terminated  or revoked sooner.       Influenza A by PCR NEGATIVE NEGATIVE   Influenza B by PCR NEGATIVE NEGATIVE    Comment: (NOTE) The Xpert Xpress SARS-CoV-2/FLU/RSV plus assay is intended as an aid in the diagnosis of influenza from Nasopharyngeal swab specimens and should not be used as a sole basis for treatment. Nasal washings and aspirates are unacceptable for Xpert Xpress SARS-CoV-2/FLU/RSV testing.  Fact Sheet for Patients: EntrepreneurPulse.com.au  Fact Sheet for Healthcare Providers: IncredibleEmployment.be  This test is not yet approved or cleared by the [place] FDA and has been authorized for detection and/or diagnosis of SARS-CoV-2 by FDA under an Emergency Use Authorization (EUA). This EUA will remain in effect (meaning this test can be used) for the duration of the COVID-19 declaration under Section 564(b)(1) of the Act, 21 U.S.C. section 360bbb-3(b)(1), unless the authorization is terminated or revoked.  Performed at [facility], [address], [place], [place] [address]   CSF cell count with differential      Status: Abnormal   Collection Time: 09/06/20  3:41 PM  Result Value Ref Range   Tube # 4    Color, CSF CLEAR (A) COLORLESS   Appearance, CSF COLORLESS CLEAR   Supernatant NOT INDICATED    RBC Count, CSF 2 (H) 0 /cu mm   WBC, CSF 3 0 - 5 /cu mm   Segmented Neutrophils-CSF TOO FEW TO COUNT, SMEAR AVAILABLE FOR REVIEW 0 - 6 %   Lymphs, CSF TOO FEW TO COUNT, SMEAR AVAILABLE FOR REVIEW 40 - 80 %   Monocyte-Macrophage-Spinal Fluid TOO FEW TO COUNT, SMEAR AVAILABLE FOR REVIEW 15 - 45 %   Eosinophils, CSF TOO FEW TO COUNT, SMEAR AVAILABLE FOR REVIEW 0 - 1 %   Other Cells, CSF TOO FEW TO COUNT, SMEAR AVAILABLE FOR REVIEW     Comment: Performed at [facility], [address], [place], [place] [address]  Protein and glucose, CSF     Status: Abnormal   Collection Time: 09/06/20  4:12 PM  Result Value Ref Range   Glucose, CSF 94 (H) 40 - 70 mg/dL   Total  Protein, CSF 155 (H) 15 - 45 mg/dL    Comment: RESULTS CONFIRMED BY MANUAL  DILUTION Performed at [facility], [address], [place], [place] [address]   CSF culture     Status: None (Preliminary result)   Collection Time: 09/06/20  4:12 PM   Specimen: CSF; Cerebrospinal Fluid  Result Value Ref Range   Specimen Description CSF    Special Requests NONE    Gram Stain      NO ORGANISMS SEEN Performed at [facility], [address], [place], [place] [address]    Culture PENDING    Report Status PENDING   Lactic acid, plasma     Status: Abnormal   Collection Time: 09/06/20  4:25 PM  Result Value Ref Range   Lactic Acid, Venous 8.9 (HH) 0.5 - 1.9 mmol/L    Comment: CRITICAL VALUE NOTED.  VALUE IS CONSISTENT WITH PREVIOUSLY REPORTED AND CALLED VALUE. Performed at [facility], [address] [place], [place] [address]   Urine rapid drug screen (hosp performed)     Status: None   Collection Time: 09/06/20  6:25 PM  Result Value Ref Range   Opiates NONE DETECTED NONE DETECTED   Cocaine NONE DETECTED NONE DETECTED   Benzodiazepines NONE  DETECTED NONE DETECTED   Amphetamines NONE DETECTED NONE DETECTED   Tetrahydrocannabinol NONE DETECTED NONE DETECTED   Barbiturates NONE DETECTED NONE DETECTED    Comment: (NOTE) DRUG SCREEN FOR MEDICAL PURPOSES ONLY.  IF CONFIRMATION IS NEEDED FOR ANY PURPOSE, NOTIFY LAB WITHIN 5 DAYS.  LOWEST DETECTABLE LIMITS FOR URINE DRUG SCREEN Drug Class                     Cutoff (ng/mL) Amphetamine and metabolites    1000 Barbiturate and metabolites    200 Benzodiazepine                 073 Tricyclics and metabolites     300 Opiates and metabolites        300 Cocaine and metabolites        300 THC                            50 Performed at [facility], [address], [place], [place] [address]   Urinalysis, Routine w reflex microscopic     Status: Abnormal   Collection Time: 09/06/20  6:25 PM  Result Value Ref Range   Color, Urine YELLOW YELLOW   APPearance HAZY (A) CLEAR   Specific Gravity, Urine 1.023 1.005 - 1.030   pH 5.0 5.0 - 8.0   Glucose, UA NEGATIVE NEGATIVE mg/dL   Hgb urine dipstick MODERATE (A) NEGATIVE   Bilirubin Urine NEGATIVE NEGATIVE   Ketones, ur NEGATIVE NEGATIVE mg/dL   Protein, ur 30 (A) NEGATIVE mg/dL   Nitrite NEGATIVE NEGATIVE   Leukocytes,Ua LARGE (A) NEGATIVE   RBC / HPF 0-5 0 - 5 RBC/hpf   WBC, UA 11-20 0 - 5 WBC/hpf   Bacteria, UA RARE (A) NONE SEEN    Comment: Performed at [facility], [address] [place],  [address]    Chemistries  Recent Labs  Lab 09/06/20 1014 09/06/20 1048  NA 138 142  K 4.3 4.4  CL 100 104  CO2 18*  --   GLUCOSE 186* 179*  BUN 20 19  CREATININE 1.53* 1.30*  CALCIUM 8.8*  --   AST 38  --   ALT 44  --   ALKPHOS 138*  --   BILITOT 1.5*  --    ------------------------------------------------------------------------------------------------------------------  ------------------------------------------------------------------------------------------------------------------  GFR: Estimated Creatinine  Clearance: 59.2 mL/min (A) (by C-G formula based on SCr of 1.3 mg/dL (H)). Liver Function Tests: Recent Labs  Lab 09/06/20 1014  AST 38  ALT 44  ALKPHOS 138*  BILITOT 1.5*  PROT 8.1  ALBUMIN 3.4*   No results for input(s): LIPASE, AMYLASE in the last 168 hours. No results for input(s): AMMONIA in the last 168 hours. Coagulation Profile: Recent Labs  Lab 09/06/20 1014  INR 1.2   Cardiac Enzymes: No results for input(s): CKTOTAL, CKMB, CKMBINDEX, TROPONINI in the last 168 hours. BNP (last 3 results) No results for input(s): PROBNP in the last 8760 hours. HbA1C: No results for input(s): HGBA1C in the last 72 hours. CBG: Recent Labs  Lab 09/06/20 1028  GLUCAP 176*   Lipid Profile: No results for input(s): CHOL, HDL, LDLCALC, TRIG, CHOLHDL, LDLDIRECT in the last 72 hours. Thyroid Function Tests: No results for input(s): TSH, T4TOTAL, FREET4, T3FREE, THYROIDAB in the last 72 hours. Anemia Panel: No results for input(s): VITAMINB12, FOLATE, FERRITIN, TIBC, IRON, RETICCTPCT in the last 72 hours.  --------------------------------------------------------------------------------------------------------------- Urine analysis:    Component Value Date/Time   COLORURINE YELLOW 09/06/2020 1825   APPEARANCEUR HAZY (A) 09/06/2020 1825   LABSPEC 1.023 09/06/2020 1825   PHURINE 5.0 09/06/2020 1825   GLUCOSEU NEGATIVE 09/06/2020 1825   HGBUR MODERATE (A) 09/06/2020 1825   BILIRUBINUR NEGATIVE 09/06/2020 1825   KETONESUR NEGATIVE 09/06/2020 1825   PROTEINUR 30 (A) 09/06/2020 1825   UROBILINOGEN 0.2 05/27/2010 2038   NITRITE NEGATIVE 09/06/2020 1825   LEUKOCYTESUR LARGE (A) 09/06/2020 1825      Imaging Results:    DG Chest Port 1 View  Result Date: 09/06/2020 CLINICAL DATA:  Slurred speech. EXAM: PORTABLE CHEST 1 VIEW COMPARISON:  July 09, 2019. FINDINGS: Stable cardiomediastinal silhouette. No pneumothorax or pleural effusion is noted. Both lungs are clear. The visualized  skeletal structures are unremarkable. IMPRESSION: No active disease. Electronically Signed   By: Marijo Conception M.D.   On: 09/06/2020 11:30   CT HEAD CODE STROKE WO CONTRAST  Result Date: 09/06/2020 CLINICAL DATA:  Code stroke.  Neuro deficit, acute, stroke suspected EXAM: CT HEAD WITHOUT CONTRAST TECHNIQUE: Contiguous axial images were obtained from the base of the skull through the vertex without intravenous contrast. COMPARISON:  02/28/2012 and prior FINDINGS: Brain: No acute infarct or intracranial hemorrhage. No mass lesion. No midline shift, ventriculomegaly or extra-axial fluid collection. Chronic basal ganglia and deep white matter calcifications, unchanged. Vascular: No hyperdense vessel or unexpected calcification. Skull: No acute finding. Sinuses/Orbits: No acute orbital finding. Pneumatized paranasal sinuses. Right mastoid effusion. Other: None. ASPECTS [facility] Stroke Program Early CT Score) - Ganglionic level infarction (caudate, lentiform nuclei, internal capsule, insula, M1-M3 cortex): 7 - Supraganglionic infarction (M4-M6 cortex): 3 Total score (0-10 with 10 being normal): 10 IMPRESSION: 1. No acute intracranial process. Chronic basal ganglia/white matter calcifications, unchanged. 2. ASPECTS is 10 Code stroke imaging results were communicated on 09/06/2020 at 10:30 am to provider Dr. Melina Copa via telephone, who verbally acknowledged these results. Electronically Signed   By: Primitivo Gauze M.D.   On: 09/06/2020 10:31    My personal review of EKG: Rhythm shows sinus tach with a QTC of 465, nonspecific ST depression, rate 134  Assessment & Plan:    Principal Problem:   Severe sepsis with septic shock ([place]) Active Problems:   Essential hypertension   AKI (acute kidney injury) ([place])   Lactic acidosis   Mild protein-calorie malnutrition ([place])   1. Septic  shock 1. Temperature 105.5 at arrival, heart rate 131, respiratory rate 36, blood pressure 72/30, lactic acid 9.6 2. Most  likely secondary to UTI 3. LP did have elevated protein, glucose 94-cover with acyclovir until viral meningitis can be ruled out 4. Broad-spectrum coverage started with cefepime, Vanco, Flagyl 5. Continue cefepime and Vanco 6. 3.5 fluid bolus given in ED 7. 150 mL/h LR continued 8. Continue to wean off Levophed as tolerated 9. Blood cultures, urine culture, CSF culture pending 10. Continue to monitor 2. AKI 1. Secondary to septic shock 2. Baseline creatinine is 0.5, creatinine up to 1.53 at arrival, repeat BMP shows creatinine 1.3 after fluid bolus 3. Continue hydration 4. Continue to trend in a.m. 3. Lactic acidosis 1. Initial lactic acid 9.6, after fluid bolus 8.9, continue LR 150 mL's per hour and continue to trend lactic acid every 3 hours 2. Secondary to septic shock 4. Hypertension 1. Hold home atenolol in the setting of septic shock 5. Mild protein calorie malnutrition 1. Advance diet as tolerated, encourage nutrient dense food options 6. Diabetes mellitus type 2 1. Hold metformin 2. Sliding scale coverage 7.    DVT Prophylaxis-   Heparin- SCDs   AM Labs Ordered, also please review Full Orders  Family Communication: No family at bedside Code Status: Full  Admission status:Inpatient :The appropriate admission status for this patient is INPATIENT. Inpatient status is judged to be reasonable and necessary in order to provide the required intensity of service to ensure the patient's safety. The patient's presenting symptoms, physical exam findings, and initial radiographic and laboratory data in the context of their chronic comorbidities is felt to place them at high risk for further clinical deterioration. Furthermore, it is not anticipated that the patient will be medically stable for discharge from the hospital within 2 midnights of admission. The following factors support the admission status of inpatient.     The patient's presenting symptoms include altered mental  status The worrisome physical exam findings include hypotension, tachycardia, encephalopathy. The initial radiographic and laboratory data are worrisome because of lactic acid 9.6, CSF with 155 protein, 94 glucose. The chronic co-morbidities include hypertension, hyperlipidemia, dementia.       * I certify that at the point of admission it is my clinical judgment that the patient will require inpatient hospital care spanning beyond 2 midnights from the point of admission due to high intensity of service, high risk for further deterioration and high frequency of surveillance required.*  Time spent in minutes : [place]

## 2020-09-06 NOTE — ED Notes (Signed)
Pt more alert at this time. Pt confused as to where he is but speech is clear. Informed pt of plan of care.

## 2020-09-06 NOTE — ED Provider Notes (Signed)
Discussed with hospitalist who will admit.   Noemi Chapel, MD 09/06/20 [phone]

## 2020-09-06 NOTE — ED Notes (Signed)
Attempted to draw lactic acid x 2 without success. Lab notified.

## 2020-09-06 NOTE — Progress Notes (Addendum)
Pharmacy Antibiotic Note  [name] is a 74 y.o. male admitted on 09/06/2020 with unknown source.  Pharmacy has been consulted for Vancomycin and cefepime dosing.  Plan: Vancomycin 2000mg  IV loading dose, then 1250mg   IV every 24 hours.  Goal trough 15-20 mcg/mL.  Cefepime 2gm IV q12h F/u cxs and clinical progress Monitor V/S, labs, and levels as indicated  Weight: 107 kg (236 lb)  Temp (24hrs), Avg:105.4 F (40.8 C), Min:105.1 F (40.6 C), Max:105.5 F (40.8 C)  Recent Labs  Lab 09/06/20 1014 09/06/20 1048  WBC 4.5  --   CREATININE  --  1.30*    Estimated Creatinine Clearance: 59.2 mL/min (A) (by C-G formula based on SCr of 1.3 mg/dL (H)).   Normalized CrCL is 90mls/min  Allergies  Allergen Reactions  . Hydrocodone   . Hydrocodone-Acetaminophen     unknown  . Aplisol [Tuberculin Ppd] Rash    Antimicrobials this admission: Vancomycin 12/11>>   Cefepime 12/11 >>   Dose adjustments this admission: prn  Microbiology results: 12/11 BCx: pending 12/11 UCx: pending  MRSA PCR:   Thank you for allowing pharmacy to be a part of this patient's care.  Isac Sarna, BS Pharm D, BCPS Clinical Pharmacist Pager [phone] 09/06/2020 11:00 AM

## 2020-09-06 NOTE — ED Notes (Signed)
CRITICAL VALUE ALERT  Critical Value:  Lactic acid 9.6  Date & Time Notied:  1125 12/11  Provider Notified: Melina Copa  Orders Received/Actions taken: tx in process

## 2020-09-06 NOTE — H&P (Signed)
NAME:  [name], MRN:  [id number], DOB:  1946-09-05, LOS: 0 ADMISSION DATE:  09/06/2020, CONSULTATION DATE: 09/06/20 REFERRING MD:  [name], CHIEF COMPLAINT:  Altered mental status  Brief History   [name] is a 74 year old man with dementia, HTN, HLD, hx colon cancer stage 3, lives in nursing home Brought to [facility] for [place].   History of present illness   Per review of chart:  Seen normal at 820am  when he took his meds.  Became acutely unresponsive  Decorticate posturing noticed on arrival by EMS. L facial droop, non verbal not moving extremities - per notes.  Slurred speech. Hypoxemic.  Non ambulatory at baseline, not oriented to time, per notes.    Mental status improving with IV fluids and antibiotics.    [name] did an LP that revealed 155 protein, 94 glucose, 2 red blood cells and 3 white blood cells. UA was significant for  white blood cell count of 11-20, rare bacteria, large leukocytes. Tele Neurology - no aphasia out of proportion to enephalopathy. (limited exam on tele). Rec MRI brain with and without contrast.    +4.5 L  No UOP recorded  Past Medical History  Dementia (per chart disoriented to time at baseline?, cannot give PMH).  Hard of hearing HLD HTN Obestiy Hx GIB, ulcer Colon cancer stage 3 - per notes had resection, no chemo due to poor functional status.    Lipitor, atenolol, lasix 40 daily, metformin  Echo 04/2019 1. The left ventricle has a visually estimated ejection fraction of 60%.  The cavity size was normal. There is mild concentric left ventricular  hypertrophy. Diastolic dysfunction, grade indeterminate. Elevated left  ventricular end-diastolic pressure No evidence of left ventricular regional wall motion abnormalities.  2. The right ventricle has normal systolic function. The cavity was  normal. There is no increase in right ventricular wall thickness.  Significant Hospital Events     Consults:    Procedures:    Significant  Diagnostic Tests:  Cxr:  No active disease.   Micro Data:  LP Glucose 94 protein 155 WBC 3, RBC 2 CXR culture P x2  Blood cultures Urine culture pending.  Antimicrobials:  Acyclovir 12/11- Cefepime 12/11-  Metronidazole 12/11  Vancomycin 12/11  Interim history/subjective:    Objective   Blood pressure (!) 102/52, pulse 95, temperature 98.4 F (36.9 C), temperature source Oral, resp. rate (!) 22, weight 107 kg, SpO2 97 %.        Intake/Output Summary (Last 24 hours) at 09/06/2020 2219 Last data filed at 09/06/2020 1423 Gross per 24 hour  Intake 4599.53 ml  Output --  Net 4599.53 ml   Filed Weights   09/06/20 1029  Weight: 107 kg    Examination: General: NAD, lethargic but arousable HENT: perrl, mmm Lungs: CTAB Cardiovascular: RRR no mgr   Abdomen: nt, nd, nbs, obese, midline incision scar present Extremities: no edema, no erythema Neuro: non focal, arousable, moving all extremities.  GU: condom cath  Skin, well perfused no rash  Resolved Hospital Problem list     Assessment & Plan:  Fever, hypotension, sepsis:  Elevated lactate.   No WBC elevation Gram negative rods on 2/2 cutlures.   4.5 L LR given over course in ED.  UA suggestive of possible infection.   No other obvious source.   LP not suggestive of bacterial meningitis.   Cont maintenance fluids.  No UOP recorded, needs bladder scan.  given possibility of UTI, needs foley for full  drainage of urine.  Cont cefepime for now, if worsens consider broadening to meropenem.    Acute encephalopathy:  Appears secondary to sepsis.   Possible UTI:  UA: leukocytes large  No nitrites, WBC 11-20, rare bacterial.   AKI Monitor Cr, UOP.    Best practice (evaluated daily)   Diet: NPO Pain/Anxiety/Delirium protocol (if indicated):  VAP protocol (if indicated):  DVT prophylaxis: heparin GI prophylaxis:  Glucose control:  Mobility:  last date of multidisciplinary goals of care discussion Family  and staff present  Summary of discussion  Follow up goals of care discussion due Code Status: Full Disposition: ICU  Labs   CBC: Recent Labs  Lab 09/06/20 1014 09/06/20 1048  WBC 4.5  --   NEUTROABS 4.0  --   HGB 15.8 16.7  HCT 49.0 49.0  MCV 93.0  --   PLT 214  --     Basic Metabolic Panel: Recent Labs  Lab 09/06/20 1014 09/06/20 1048  NA 138 142  K 4.3 4.4  CL 100 104  CO2 18*  --   GLUCOSE 186* 179*  BUN 20 19  CREATININE 1.53* 1.30*  CALCIUM 8.8*  --    GFR: Estimated Creatinine Clearance: 59.2 mL/min (A) (by C-G formula based on SCr of 1.3 mg/dL (H)). Recent Labs  Lab 09/06/20 1014 09/06/20 1045 09/06/20 1625  WBC 4.5  --   --   LATICACIDVEN  --  9.6* 8.9*    Liver Function Tests: Recent Labs  Lab 09/06/20 1014  AST 38  ALT 44  ALKPHOS 138*  BILITOT 1.5*  PROT 8.1  ALBUMIN 3.4*   No results for input(s): LIPASE, AMYLASE in the last 168 hours. No results for input(s): AMMONIA in the last 168 hours.  ABG    Component Value Date/Time   HCO3 21.7 09/06/2020 1110   TCO2 18 (L) 09/06/2020 1048   ACIDBASEDEF 4.7 (H) 09/06/2020 1110   O2SAT 95.8 09/06/2020 1110     Coagulation Profile: Recent Labs  Lab 09/06/20 1014  INR 1.2    Cardiac Enzymes: No results for input(s): CKTOTAL, CKMB, CKMBINDEX, TROPONINI in the last 168 hours.  HbA1C: Hgb A1c MFr Bld  Date/Time Value Ref Range Status  05/08/2019 09:28 AM 5.5 4.8 - 5.6 % Final    Comment:    (NOTE) Pre diabetes:          5.7%-6.4% Diabetes:              >6.4% Glycemic control for   <7.0% adults with diabetes   05/02/2018 05:24 AM 6.2 (H) 4.8 - 5.6 % Final    Comment:    (NOTE) Pre diabetes:          5.7%-6.4% Diabetes:              >6.4% Glycemic control for   <7.0% adults with diabetes     CBG: Recent Labs  Lab 09/06/20 1028  GLUCAP 176*    Review of Systems:   Unable to assess  Past Medical History  He,  has a past medical history of Bell's palsy, Bleeding  ulcer, Dementia, Hard of hearing, Hyperlipemia, Hypertension, Obesity, and SOB (shortness of breath) on exertion.   Surgical History    Past Surgical History:  Procedure Laterality Date  . BIOPSY  05/09/2019   Procedure: BIOPSY;  Surgeon: Douglas Dolin, MD;  Location: [facility];  Service: Endoscopy;;  . COLONOSCOPY  10/06/06   internal hemorrhoids and anal papilla/poor prep  . 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 ESOPHAGOGASTRODUODENOSCOPY N/A 05/01/2018   multiple 3-6 mm gastric nodules and gastritis, surrounding telangectasias. Biopsy with atrophic gastritis.   Marland Kitchen FOOT SURGERY    . KNEE ARTHROSCOPY  06/17/2011   Procedure: ARTHROSCOPY KNEE;  Surgeon: Sanjuana Kava;  Location: [facility];  Service: Orthopedics;  Laterality: Right;  . LAPAROSCOPIC PARTIAL COLECTOMY N/A 05/11/2019   Procedure: ATTEMPTED LAPAROSCOPIC PARTIAL COLECTOMY;  Surgeon: [name], MD;  Location: [facility];  Service: General;  Laterality: N/A;  . PARTIAL COLECTOMY  05/11/2019   Procedure: PARTIAL COLECTOMY (Laparotomy incision at 1740;  Surgeon: [name], MD;  Location: [facility];  Service: General;;  . POLYPECTOMY  05/09/2019   Procedure: POLYPECTOMY;  Surgeon: Douglas Dolin, MD;  Location: [facility];  Service: Endoscopy;;     Social History   reports that he has quit smoking. His smoking use included cigarettes. He has a 30.00 pack-year smoking history. He has never used smokeless tobacco. He reports that he does not drink alcohol and does not use drugs.   Family History   His family history includes COPD in his mother; Cancer in his brother. There is no history of Anesthesia problems, Hypotension, Malignant hyperthermia, Pseudochol deficiency, or Colon cancer.   Allergies Allergies  Allergen Reactions  . Hydrocodone   . Hydrocodone-Acetaminophen     unknown  . Aplisol [Tuberculin Ppd] Rash     Home Medications  Prior to Admission medications   Medication Sig  Start Date End Date Taking? Authorizing Provider  acetaminophen (TYLENOL) 650 MG CR tablet Take 650 mg by mouth 3 (three) times daily.   Yes [provider]  atenolol (TENORMIN) 25 MG tablet Take 25 mg by mouth daily.    Yes [provider]  atorvastatin (LIPITOR) 10 MG tablet Take 5 mg by mouth at bedtime.    Yes [provider]  carboxymethylcellulose (REFRESH PLUS) 0.5 % SOLN Place 1 drop into both eyes every 6 (six) hours as needed (Dry eye).    Yes [provider]  cholecalciferol (VITAMIN D3) 25 MCG (1000 UNIT) tablet Take 1,000 Units by mouth daily.   Yes [provider]  Colloidal Oatmeal (EUCERIN ECZEMA RELIEF) 1 % CREA Apply 1 application topically 2 (two) times daily. Apply to face and neck   Yes [provider]  cycloSPORINE (RESTASIS) 0.05 % ophthalmic emulsion Place 1 drop into both eyes 2 (two) times daily.   Yes [provider]  ferrous sulfate 325 (65 FE) MG EC tablet Take 325 mg by mouth daily at 12 noon.   Yes [provider]  furosemide (LASIX) 40 MG tablet Take 40 mg by mouth daily.   Yes [provider]  ketoconazole (NIZORAL) 2 % shampoo Apply 1 application topically every 12 (twelve) hours as needed for irritation. Apply to Scalp   Yes [provider]  metFORMIN (GLUCOPHAGE) 500 MG tablet Take 500 mg by mouth at bedtime.   Yes [provider]  mometasone (ELOCON) 0.1 % cream Apply 1 application topically daily as needed (Dry skin).    Yes [provider]  Multiple Vitamin (MULTIVITAMIN) tablet Take 1 tablet by mouth daily.   Yes [provider]  Omega-3 Fatty Acids (FISH OIL) 1000 MG CAPS Take 2,000 mg by mouth 2 (two) times daily.    Yes [provider]  potassium chloride (KLOR-CON) 20 MEQ packet Take 20 mEq by mouth 2 (two) times daily.    Yes [provider]  Skin Protectants, Misc. (EUCERIN) cream Apply 1 application topically daily. Apply  to feet   Yes [provider]  vitamin B-12 (CYANOCOBALAMIN) 1000 MCG tablet Take 1,000 mcg by mouth daily.   Yes [provider]  linaclotide (LINZESS) 290 MCG CAPS capsule Take 1 capsule (290 mcg total) by mouth daily before breakfast. Start 5 days prior to bowel prep Patient not taking: No sig reported 08/26/20   Rourk, Cristopher Estimable, MD  polyethylene glycol-electrolytes (NULYTELY) 420 g solution As directed Patient not taking: No sig reported 08/26/20   Rourk, Cristopher Estimable, MD     Critical care time: 27min     Collier Bullock MD CCM

## 2020-09-06 NOTE — ED Notes (Signed)
Pt in bed with eyes open, pt oriented to person, doesn't know place or time, re oriented pt.  Pt reports some knee pain, but can not quantify the pain.

## 2020-09-06 NOTE — Consult Note (Signed)
[facility] TELEMEDICINE  CONSULT   Date of service: September 06, 2020 Patient Name: [name] MRN:  [id number] DOB:  Aug 26, 1946 Requesting Provider: Aletta Edouard Location of the provider: [facility] Neurohospitalist Office Location of the patient: [facility] Bed#10 Reason for consult: "Stroke code for dysarthria"   This consult was provided via telemedicine with 2-way video and audio communication. The patient/family was informed that care would be provided in this way and agreed to receive care in this manner.    History of Present Illness  [name] is a 74 y.o. male with PMH significant for  has a past medical history of Bell's palsy, Bleeding ulcer, Dementia, Hard of hearing, Hyperlipemia, Hypertension, Obesity, colon cancer age 54 status post right hemicolectomy no chemotherapy due to poor functional status who presents to the [facility] emergency department with acute onset unresponsiveness.  Patient was encephalopathic and unable to provide any meaningful history.  Per reported history from EMS, patient lives at a nursing facility was okay at 0730 on 09/06/2020.  Was noted to be nonresponsive, some concern for decorticate posturing was noted at 0820 on 09/06/2020.  He was brought in by EMS who noted some slurring of the speech in addition.  He was brought in as a stroke code.  On my evaluation over televisit, patient is noted to have some movement in bilateral upper extremities and was able to wiggle his toes on command.  He was very encephalopathic and required a lot of encouragement from the team taking care of [facility] at [facility].  He was able to state that he was in pain and his speech did not sound dysarthric.  He did not seem to have any aphasia out of proportion to encephalopathy on my limited telemetry evaluation.  He was noted to have symmetric facial grimace to pain.  He was noted to have grimace to pain in all extremities.  Vitals with a rectal temp of  105.1, tachycardia to 131 with SBP of 110/57 and tachypneic on video eval.   Stroke Measures   Last Known Well: 0730 on 09/06/2020. TPA Given: No, suspect this is more consistent with encephalopathy.  My suspicion for a large stroke is low. IR Thrombectomy: No, lpoor baseline mRS. mRS: Modified Rankin Scale: 4-Needs assistance to walk and tend to bodily needs Time of teleneurologist evaluation: 10:24   Vitals   There were no vitals filed for this visit.   There is no height or weight on file to calculate BMI.   Tele-neuro Exam and NIHSS   General: somnolent, eyes open, does not respond to voice, grimace to sternal rub. Wakes up a little bit more later during evaluation.   NIHSS components Score: Comment  1a Level of Conscious 0[]  1[]  2[x]  3[]      1b LOC Questions 0[]  1[]  2[x]       1c LOC Commands 0[x]  1[]  2[]       2 Best Gaze 0[x]  1[]  2[]     Midline gaze  3 Visual 0[x]  1[]  2[]  3[]    Blinks to threat on left and right  4 Facial Palsy 0[x]  1[]  2[]  3[]    Symmetric grimace to pain  5a Motor Arm - left 0[]  1[]  2[]  3[x]  4[]  UN[]    5b Motor Arm - Right 0[]  1[]  2[]  3[x]  4[]  UN[]    6a Motor Leg - Left 0[]  1[]  2[]  3[x]  4[]  UN[]    6b Motor Leg - Right 0[]  1[]  2[]  3[x]  4[]  UN[]    7 Limb Ataxia 0[x]  1[]   2[]  3[]  UN[]     8 Sensory 0[x]  1[]  2[]  UN[]      9 Best Language 0[x]  1[]  2[]  3[]      10 Dysarthria 0[x]  1[]  2[]  UN[]      11 Extinct. and Inattention 0[x]  1[]  2[]       TOTAL: 16    Other exam findings: encephalopathic  Imaging and Labs   CBC: No results for input(s): WBC, NEUTROABS, HGB, HCT, MCV, PLT in the last 168 hours.  Basic Metabolic Panel:  Lab Results  Component Value Date   NA 138 03/25/2020   K 3.5 03/25/2020   CO2 29 03/25/2020   GLUCOSE 159 (H) 03/25/2020   BUN 13 03/25/2020   CREATININE 0.56 (L) 03/25/2020   CALCIUM 8.7 (L) 03/25/2020   GFRNONAA >60 03/25/2020   GFRAA >60 03/25/2020   Lipid Panel: No results found for: LDLCALC HgbA1c:  Lab Results   Component Value Date   HGBA1C 5.5 05/08/2019   Urine Drug Screen: No results found for: LABOPIA, COCAINSCRNUR, LABBENZ, AMPHETMU, THCU, LABBARB  Alcohol Level No results found for: [facility]  CTH without contrast: Notable for calcifications. CTH was negative for a large hypodensity concerning for a large territory infarct or hyperdensity concerning for an ICH.  Impression   [name] is a 74 y.o. male presentign with acute onset encephalopathy and ?posturing noted by EMS and dysarthria. His limited tele-neurologic examination is notable for encephalopathy, requires a lot of encouragement but is able to wiggles both toes, some movement in BL lower extremities. Given able to follow simple one step commands with encouragement, unlikely that he has any receptive aphasia, he did mention that he is in pain so unlikely that he has any expressive aphasia. I do not think that his aphasia is out of proportion to his encephalopathy.  Given acute onset, reasonable to get MRI Brain with and without contrast to rule out CNS mets, infection. Vitals with Tmax of 105.1 in the ED with tachycardia to 131 and tachypnea. Suspect this is likely toxic/metabolic encephalopathy in the setting of a potential infectious etiology.  Recommendations  - Recommend MRI Brain with and without contrast when able  ____________________________________________________________________   This patient is receiving care for possible acute neurological changes. There was 40 minutes of care by this provider at the time of service, including time for direct evaluation via telemedicine, review of medical records, imaging studies and discussion of findings with providers, the patient and/or family.  Donnetta Simpers [facility] Pager Number [phone]  If 7pm- 7am, please page neurology on call as listed in [place].

## 2020-09-06 NOTE — Sepsis Progress Note (Signed)
Sepsis protocol is being monitored by eLink. 

## 2020-09-06 NOTE — ED Notes (Signed)
Lab at bedside for lactic acid.

## 2020-09-06 NOTE — Progress Notes (Signed)
Code Stroke Time Documentation   2330 Call Time n/a Beeper Time 1017 Exam Started  1022 Exam Finished 0762 Images sent to [facility] 1022 Exam completed in [place] radiology called

## 2020-09-06 NOTE — ED Provider Notes (Signed)
[facility] EMERGENCY DEPARTMENT Provider Note   CSN: [id number] Arrival date & time: 09/06/20  1011     History No chief complaint on file.   [name] is a 74 y.o. male.  He is here as a code stroke activation.  Level 5 caveat secondary to altered mental status.  He was reportedly well at 820 this morning when he took his meds.  EMS said on arrival he seemed to have some decorticate posturing.  Now he has left facial droop not speaking not moving arms or legs.  He was satting 88% on room air.  They placed him on a nonrebreather and brought him here.  Patient unable to answer any questions or comply with neurologic exam  The history is provided by the patient and the EMS personnel.  Cerebrovascular Accident This is a new problem. The current episode started 1 to 2 hours ago. The problem occurs constantly. The problem has not changed since onset.Nothing aggravates the symptoms. Nothing relieves the symptoms. He has tried nothing for the symptoms. The treatment provided no relief.       Past Medical History:  Diagnosis Date  . Bell's palsy   . Bleeding ulcer    remote past  . Dementia    disoriented to time.does not know medical hx.information obtained from previous charts  . Hard of hearing   . Hyperlipemia   . Hypertension   . Obesity   . SOB (shortness of breath) on exertion     Patient Active Problem List   Diagnosis Date Noted  . 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 Colonic mass 05/09/2019  . 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 Abdominal pain 05/08/2019  . Dementia   . SOB (shortness of breath) on exertion   . Hypotension   . Acute blood loss anemia 05/01/2018  . Heme positive stool 05/01/2018  . Guaiac positive stools   . Iron deficiency anemia due to chronic blood loss   . GIB (gastrointestinal bleeding) 04/30/2018  . History of Bleeding ulcer   . Hypertension   . Encounter for screening colonoscopy 10/08/2011  . CLOSED DISLOCATION OF FOOT UNSPECIFIED PART 01/15/2010     Past Surgical History:  Procedure Laterality Date  . BIOPSY  05/09/2019   Procedure: BIOPSY;  Surgeon: Daneil Dolin, MD;  Location: [facility] ENDO SUITE;  Service: Endoscopy;;  . COLONOSCOPY  10/06/06   internal hemorrhoids and anal papilla/poor prep  . 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 ESOPHAGOGASTRODUODENOSCOPY N/A 05/01/2018   multiple 3-6 mm gastric nodules and gastritis, surrounding telangectasias. Biopsy with atrophic gastritis.   Marland Kitchen FOOT SURGERY    . KNEE ARTHROSCOPY  06/17/2011   Procedure: ARTHROSCOPY KNEE;  Surgeon: Sanjuana Kava;  Location: [facility] ORS;  Service: Orthopedics;  Laterality: Right;  . LAPAROSCOPIC PARTIAL COLECTOMY N/A 05/11/2019   Procedure: ATTEMPTED LAPAROSCOPIC PARTIAL COLECTOMY;  Surgeon: [name], MD;  Location: [facility] ORS;  Service: General;  Laterality: N/A;  . PARTIAL COLECTOMY  05/11/2019   Procedure: PARTIAL COLECTOMY (Laparotomy incision at 6712;  Surgeon: [name], MD;  Location: [facility] ORS;  Service: General;;  . POLYPECTOMY  05/09/2019   Procedure: POLYPECTOMY;  Surgeon: Daneil Dolin, MD;  Location: [facility] ENDO SUITE;  Service: Endoscopy;;       Family History  Problem Relation Age of Onset  . COPD Mother   . Cancer Brother   . Anesthesia problems Neg Hx   . Hypotension Neg Hx   . Malignant hyperthermia  Neg Hx   . Pseudochol deficiency Neg Hx   . Colon cancer Neg Hx     Social History   Tobacco Use  . Smoking status: Former Smoker    Packs/day: 1.00    Years: 30.00    Pack years: 30.00    Types: Cigarettes  . Smokeless tobacco: Never Used  . Tobacco comment: quit a "long time ago"   Vaping Use  . Vaping Use: Never used  Substance Use Topics  . Alcohol use: No  . Drug use: No    Home Medications Prior to Admission medications   Medication Sig Start Date End Date Taking? Authorizing Provider  acetaminophen (TYLENOL) 650 MG CR tablet Take 650 mg by mouth 3 (three) times daily.     [provider]  atenolol (TENORMIN) 25 MG tablet Take 25 mg by mouth daily.     [provider]  atorvastatin (LIPITOR) 10 MG tablet Take 5 mg by mouth at bedtime.     [provider]  carboxymethylcellulose (REFRESH TEARS) 0.5 % SOLN Place 1 drop into both eyes every 6 (six) hours as needed (Dry eye).     [provider]  cholecalciferol (VITAMIN D3) 25 MCG (1000 UNIT) tablet Take 1,000 Units by mouth daily.    [provider]  Colloidal Oatmeal (EUCERIN ECZEMA RELIEF) 1 % CREA Apply 1 application topically 2 (two) times daily. Apply to face and neck    [provider]  cycloSPORINE (RESTASIS) 0.05 % ophthalmic emulsion Place 1 drop into both eyes 2 (two) times daily.    [provider]  ferrous sulfate 325 (65 FE) MG EC tablet Take 325 mg by mouth 2 (two) times daily.    [provider]  furosemide (LASIX) 40 MG tablet Take 40 mg by mouth daily.    [provider]  ipratropium-albuterol (DUONEB) 0.5-2.5 (3) MG/3ML SOLN Take 3 mLs by nebulization every 8 (eight) hours as needed.    [provider]  ketoconazole (NIZORAL) 2 % cream Apply 1 application topically every 12 (twelve) hours as needed (Rash). Apply to left thigh and back    [provider]  ketoconazole (NIZORAL) 2 % shampoo Apply 1 application topically every 12 (twelve) hours as needed for irritation. Apply to Scalp    [provider]  linaclotide Rolan Lipa) 290 MCG CAPS capsule Take 1 capsule (290 mcg total) by mouth daily before breakfast. Start 5 days prior to bowel prep 08/26/20   Rourk, Cristopher Estimable, MD  magnesium hydroxide (MILK OF MAGNESIA) 400 MG/5ML suspension Take 30 mLs by mouth daily as needed for mild constipation.    [provider]  metFORMIN (GLUCOPHAGE) 500 MG tablet Take 500 mg by mouth at bedtime.    [provider]  mometasone (ELOCON) 0.1 % cream Apply 1 application topically daily as needed (Dry skin).      [provider]  Multiple Vitamin (MULTIVITAMIN) tablet Take 1 tablet by mouth daily.    [provider]  Omega-3 Fatty Acids (FISH OIL) 1000 MG CAPS Take 2,000 mg by mouth 2 (two) times daily.     [provider]  polyethylene glycol-electrolytes (NULYTELY) 420 g solution As directed 08/26/20   Rourk, Cristopher Estimable, MD  potassium chloride (KLOR-CON) 20 MEQ packet Take 20 mEq by mouth 2 (two) times daily.     [provider]  Skin Protectants, Misc. (EUCERIN) cream Apply 1 application topically daily. Apply to feet    [provider]  vitamin  B-12 (CYANOCOBALAMIN) 1000 MCG tablet Take 1,000 mcg by mouth daily.    [provider]  ZINC OXIDE, TOPICAL, 10 % CREA Apply 1 application topically 2 (two) times daily. Apply to Buttocks and Scrotum    [provider]    Allergies    Hydrocodone, Hydrocodone-acetaminophen, and Aplisol [tuberculin ppd]  Review of Systems   Review of Systems  Unable to perform ROS: Mental status change    Physical Exam Updated Vital Signs BP (!) 110/57   Pulse (!) 131   Resp (!) 22   Wt 107 kg   SpO2 92%   BMI 34.85 kg/m   Physical Exam Vitals and nursing note reviewed.  Constitutional:      Appearance: He is well-developed and well-nourished. He is obese.  HENT:     Head: Normocephalic and atraumatic.  Eyes:     Conjunctiva/sclera: Conjunctivae normal.  Cardiovascular:     Rate and Rhythm: Regular rhythm. Tachycardia present.     Pulses: Normal pulses.     Heart sounds: No murmur heard.   Pulmonary:     Effort: Pulmonary effort is normal. No respiratory distress.     Breath sounds: Normal breath sounds.  Abdominal:     Palpations: Abdomen is soft.     Tenderness: There is no abdominal tenderness.  Musculoskeletal:     Cervical back: Neck supple.     Right lower leg: Edema present.     Left lower leg: Edema present.  Skin:    General: Skin is warm and dry.     Comments: He does have  some early sacral breakdown.  Neurological:     Mental Status: He is alert.     GCS: GCS eye subscore is 3. GCS verbal subscore is 2. GCS motor subscore is 1.     Comments: Patient will open eyes to voice.  Not moving upper or lower extremities.  Appears to have some left facial droop.  Leaning towards the right.  Psychiatric:        Mood and Affect: Mood and affect normal.     ED Results / Procedures / Treatments   Labs (all labs ordered are listed, but only abnormal results are displayed) Labs Reviewed  DIFFERENTIAL - Abnormal; Notable for the following components:      Result Value   Lymphs Abs 0.4 (*)    All other components within normal limits  COMPREHENSIVE METABOLIC PANEL - Abnormal; Notable for the following components:   CO2 18 (*)    Glucose, Bld 186 (*)    Creatinine, Ser 1.53 (*)    Calcium 8.8 (*)    Albumin 3.4 (*)    Alkaline Phosphatase 138 (*)    Total Bilirubin 1.5 (*)    GFR, Estimated 47 (*)    Anion gap 20 (*)    All other components within normal limits  BLOOD GAS, VENOUS - Abnormal; Notable for the following components:   pCO2, Ven 32.0 (*)    pO2, Ven 103.0 (*)    Acid-base deficit 4.7 (*)    All other components within normal limits  LACTIC ACID, PLASMA - Abnormal; Notable for the following components:   Lactic Acid, Venous 9.6 (*)    All other components within normal limits  LACTIC ACID, PLASMA - Abnormal; Notable for the following components:   Lactic Acid, Venous 8.9 (*)    All other components within normal limits  I-STAT CHEM 8, ED - Abnormal; Notable for the following components:   Creatinine,  Ser 1.30 (*)    Glucose, Bld 179 (*)    Calcium, Ion 1.02 (*)    TCO2 18 (*)    All other components within normal limits  CBG MONITORING, ED - Abnormal; Notable for the following components:   Glucose-Capillary 176 (*)    All other components within normal limits  RESP PANEL BY RT-PCR (FLU A&B, COVID) ARPGX2  CULTURE, BLOOD (ROUTINE X 2)   CULTURE, BLOOD (ROUTINE X 2)  URINE CULTURE  CSF CULTURE  ETHANOL  PROTIME-INR  APTT  CBC  RAPID URINE DRUG SCREEN, HOSP PERFORMED  URINALYSIS, ROUTINE W REFLEX MICROSCOPIC  PROTEIN AND GLUCOSE, CSF  CSF CELL COUNT WITH DIFFERENTIAL    EKG EKG Interpretation  Date/Time:  Saturday September 06 2020 10:31:59 EST Ventricular Rate:  134 PR Interval:    QRS Duration: 91 QT Interval:  311 QTC Calculation: 465 R Axis:   -50 Text Interpretation: Sinus tachycardia Left anterior fascicular block Abnormal R-wave progression, late transition Minimal ST depression, lateral leads increased rate from prior 8/19 Confirmed by Aletta Edouard [id number]) on 09/06/2020 11:31:47 AM   Radiology DG Chest Port 1 View  Result Date: 09/06/2020 CLINICAL DATA:  Slurred speech. EXAM: PORTABLE CHEST 1 VIEW COMPARISON:  July 09, 2019. FINDINGS: Stable cardiomediastinal silhouette. No pneumothorax or pleural effusion is noted. Both lungs are clear. The visualized skeletal structures are unremarkable. IMPRESSION: No active disease. Electronically Signed   By: Marijo Conception M.D.   On: 09/06/2020 11:30   CT HEAD CODE STROKE WO CONTRAST  Result Date: 09/06/2020 CLINICAL DATA:  Code stroke.  Neuro deficit, acute, stroke suspected EXAM: CT HEAD WITHOUT CONTRAST TECHNIQUE: Contiguous axial images were obtained from the base of the skull through the vertex without intravenous contrast. COMPARISON:  02/28/2012 and prior FINDINGS: Brain: No acute infarct or intracranial hemorrhage. No mass lesion. No midline shift, ventriculomegaly or extra-axial fluid collection. Chronic basal ganglia and deep white matter calcifications, unchanged. Vascular: No hyperdense vessel or unexpected calcification. Skull: No acute finding. Sinuses/Orbits: No acute orbital finding. Pneumatized paranasal sinuses. Right mastoid effusion. Other: None. ASPECTS [facility] Stroke Program Early CT Score) - Ganglionic level infarction (caudate,  lentiform nuclei, internal capsule, insula, M1-M3 cortex): 7 - Supraganglionic infarction (M4-M6 cortex): 3 Total score (0-10 with 10 being normal): 10 IMPRESSION: 1. No acute intracranial process. Chronic basal ganglia/white matter calcifications, unchanged. 2. ASPECTS is 10 Code stroke imaging results were communicated on 09/06/2020 at 10:30 am to provider Dr. Melina Copa via telephone, who verbally acknowledged these results. Electronically Signed   By: Primitivo Gauze M.D.   On: 09/06/2020 10:31    Procedures .Critical Care Performed by: Hayden Rasmussen, MD Authorized by: Hayden Rasmussen, MD   Critical care provider statement:    Critical care time (minutes):  80   Critical care time was exclusive of:  Separately billable procedures and treating other patients   Critical care was necessary to treat or prevent imminent or life-threatening deterioration of the following conditions:  Sepsis and CNS failure or compromise   Critical care was time spent personally by me on the following activities:  Discussions with consultants, evaluation of patient's response to treatment, examination of patient, ordering and performing treatments and interventions, ordering and review of laboratory studies, ordering and review of radiographic studies, pulse oximetry, re-evaluation of patient's condition, obtaining history from patient or surrogate, review of old charts and development of treatment plan with patient or surrogate   Care discussed with: admitting provider    .Lumbar  Puncture  Date/Time: 09/06/2020 4:12 PM Performed by: Hayden Rasmussen, MD Authorized by: Hayden Rasmussen, MD   Consent:    Consent obtained:  Verbal   Consent given by:  Patient   Risks, benefits, and alternatives were discussed: yes     Risks discussed:  Bleeding, headache, pain and repeat procedure   Alternatives discussed:  Delayed treatment Universal protocol:    Procedure explained and questions answered to patient  or proxy's satisfaction: yes     Test results available: yes     Imaging studies available: yes     Immediately prior to procedure a time out was called: yes     Site/side marked: yes     Patient identity confirmed:  Verbally with patient and arm band Pre-procedure details:    Procedure purpose:  Diagnostic   Preparation: Patient was prepped and draped in usual sterile fashion   Anesthesia:    Anesthesia method:  Local infiltration   Local anesthetic:  Lidocaine 2% WITH epi Procedure details:    Lumbar space:  L3-L4 interspace   Patient position:  R lateral decubitus   Needle gauge:  22   Ultrasound guidance: no     Number of attempts:  1   Fluid appearance:  Clear   Tubes of fluid:  4   Total volume (ml):  5 Post-procedure details:    Puncture site:  Adhesive bandage applied and direct pressure applied   Procedure completion:  Tolerated   (including critical care time)  Medications Ordered in ED Medications  lactated ringers infusion ( Intravenous New Bag/Given 09/06/20 1231)  vancomycin (VANCOREADY) IVPB 2000 mg/400 mL (0 mg Intravenous Stopped 09/06/20 1355)    Followed by  vancomycin (VANCOREADY) IVPB 1250 mg/250 mL (has no administration in time range)  ceFEPIme (MAXIPIME) 2 g in sodium chloride 0.9 % 100 mL IVPB (has no administration in time range)  0.9 %  sodium chloride infusion (0 mLs Intravenous Hold 09/06/20 1657)  norepinephrine (LEVOPHED) 4mg  in 226mL premix infusion (9 mcg/min Intravenous Rate/Dose Change 09/06/20 1716)  lactated ringers bolus 1,000 mL (0 mLs Intravenous Stopped 09/06/20 1140)    And  lactated ringers bolus 1,000 mL (0 mLs Intravenous Stopped 09/06/20 1216)    And  lactated ringers bolus 1,000 mL (0 mLs Intravenous Stopped 09/06/20 1216)    And  lactated ringers bolus 500 mL (0 mLs Intravenous Stopped 09/06/20 1140)  ceFEPIme (MAXIPIME) 2 g in sodium chloride 0.9 % 100 mL IVPB (0 g Intravenous Stopped 09/06/20 1150)  metroNIDAZOLE (FLAGYL)  IVPB 500 mg (0 mg Intravenous Stopped 09/06/20 1231)  acetaminophen (TYLENOL) suppository 650 mg (650 mg Rectal Given 09/06/20 1054)  sodium chloride 0.9 % bolus 500 mL (0 mLs Intravenous Stopped 09/06/20 1423)  lidocaine-EPINEPHrine (XYLOCAINE W/EPI) 2 %-1:200000 (PF) injection 20 mL (20 mLs Intradermal Given 09/06/20 1615)  acetaminophen (TYLENOL) suppository 650 mg (650 mg Rectal Given 09/06/20 1723)    ED Course  I have reviewed the triage vital signs and the nursing notes.  Pertinent labs & imaging results that were available during my care of the patient were reviewed by me and considered in my medical decision making (see chart for details).  Clinical Course as of 09/06/20 1756  Sat Sep 06, 2020  1024 Reviewed prior chart.  Sounds like the patient has poor fund of information.  He has a legal guardian.  He is a nursing home residents and is nonambulatory at baseline.  It sounds like he can answer some simple questions  though.  Has a history of colon cancer although is not getting chemotherapy due to poor functional status. [MB]  4332 Patient with stroke activation.  His head CT was interpreted as normal with aspect of 10 per radiology.  Neurology and I evaluated the patient together.  Over the course of a few minutes he was able to speak and show some movement in his upper and lower extremities.  Not really following commands.  Neurology is not recommending TPA thinks we should consider more encephalopathy.  He is recommending getting CTA and CT perfusion once labs are back. [MB]  1124 Lactate came back at 9.6.  Sepsis fluids infusing.  Antibiotics infusing [MB]  9518 Nursing said they tried to catheterize patient but were unsuccessful. Still waiting on urine. Fever improved not completely resolved. Mental status is somewhat improved. Pressures remain soft. Will need admission to the hospital. [MB]  1319 Clinically the patient is appearing better.  Tachycardia improved and mental status  improved.  Blood pressures remain soft.  Has received a sepsis fluids.  We will give him a little more fluids and start some peripheral levo. [MB]  73 Called Dr. Wynetta Emery [facility] hospitalist for discussion of admission.  He said because the patient was on pressors and will need an MRI at some point that he would like the intensivist contacted to see if they would admit the patient at [facility]. [MB]  8416 Discussed with critical care intensivist Dr. Lynetta Mare.  He feels the patient could be adequately treated here.  Consider more IV fluids.  Feels with his clinical improvement in his mental status that his tissue perfusion is better and so repeating his lactate.  Asked that the hospitalist call him if they have any further management questions. [MB]  80 Reconnected with neurology Dr. Lorrin Goodell and he is recommending LP and MRI if no source of fever is identified.  I consented the patient for an LP performed with clear CSF.  Patient is signed out to Dr. Sabra Heck to follow-up on CSF findings and help with disposition to [place] for admission. [MB]    Clinical Course User Index [MB] Hayden Rasmussen, MD   MDM Rules/Calculators/A&P                         This patient complains of altered mental status and fever; this involves an extensive number of treatment Options and is a complaint that carries with it a high risk of complications and Morbidity. The differential includes stroke, bleed, encephalopathy, infection, meningitis, encephalitis, pneumonia, Covid, pyelonephritis, sepsis  [name] was evaluated in Emergency Department on 09/06/2020 for the symptoms described in the history of present illness. He was evaluated in the context of the global COVID-19 pandemic, which necessitated consideration that the patient might be at risk for infection with the SARS-CoV-2 virus that causes COVID-19. Institutional protocols and algorithms that pertain to the evaluation of patients at risk for COVID-19 are in a  state of rapid change based on information released by regulatory bodies including the CDC and federal and state organizations. These policies and algorithms were followed during the patient's care in the ED.   I ordered, reviewed and interpreted labs, which included CBC with normal white count normal hemoglobin, chemistries with low bicarb elevated creatinine reflecting some dehydration LFTs mildly elevated Covid testing negative I ordered medication IV fluids for sepsis, IV antibiotics for undifferentiated sepsis I ordered imaging studies which included chest x-ray and CT head and I  independently    visualized and interpreted imaging which showed no acute findings Additional history obtained from from EMS Previous records obtained and reviewed in epic, patient with colon cancer resected currently not on chemo I consulted [facility] hospitalist Dr. Wynetta Emery intensivist Dr. Lynetta Mare teleneurologist Dr. Lorrin Goodell and discussed lab and imaging findings  Critical Interventions: Work-up and management of altered mental status and sepsis with aggressive antibiotics and IV fluids.  Also required LP for further work-up.  After the interventions stated above, I reevaluated the patient and found to be with improved mental status.  Cardia also resolved although blood pressure soft.  On low-dose pressors along with IV antibiotics and fluids.  Status post lumbar puncture.  Patient will need admission and potential transfer to level of care.  Signed out to oncoming provider Dr. Sabra Heck who will assist with final disposition.  Sepsis - Repeat Assessment  Performed at:    1500  Vitals     Blood pressure (!) 91/50, pulse (!) 107, temperature (!) 101.1 F (38.4 C), temperature source Rectal, resp. rate (!) 27, weight 107 kg, SpO2 99 %.  Heart:     Tachycardic  Lungs:    CTA  Capillary Refill:   <2 sec  Peripheral Pulse:   Radial pulse palpable  Skin:     Dry   Final Clinical Impression(s) / ED  Diagnoses Final diagnoses:  Severe sepsis ([place])  Encephalopathy acute    Rx / DC Orders ED Discharge Orders    None       Hayden Rasmussen, MD 09/06/20 1800

## 2020-09-07 ENCOUNTER — Inpatient Hospital Stay (HOSPITAL_COMMUNITY): Payer: Medicare (Managed Care)

## 2020-09-07 ENCOUNTER — Encounter (HOSPITAL_COMMUNITY): Payer: Self-pay | Admitting: Pulmonary Disease

## 2020-09-07 DIAGNOSIS — A419 Sepsis, unspecified organism: Secondary | ICD-10-CM

## 2020-09-07 DIAGNOSIS — L8990 Pressure ulcer of unspecified site, unspecified stage: Secondary | ICD-10-CM | POA: Insufficient documentation

## 2020-09-07 DIAGNOSIS — J9600 Acute respiratory failure, unspecified whether with hypoxia or hypercapnia: Secondary | ICD-10-CM

## 2020-09-07 LAB — BLOOD CULTURE ID PANEL (REFLEXED) - BCID2

## 2020-09-07 LAB — COMPREHENSIVE METABOLIC PANEL
ALT: 24 U/L (ref 0–44)
AST: 49 U/L — ABNORMAL HIGH (ref 15–41)
Albumin: 2.5 g/dL — ABNORMAL LOW (ref 3.5–5.0)
Alkaline Phosphatase: 75 U/L (ref 38–126)
Anion gap: 21 — ABNORMAL HIGH (ref 5–15)
BUN: 26 mg/dL — ABNORMAL HIGH (ref 8–23)
CO2: 18 mmol/L — ABNORMAL LOW (ref 22–32)
Calcium: 8.3 mg/dL — ABNORMAL LOW (ref 8.9–10.3)
Chloride: 101 mmol/L (ref 98–111)
Creatinine, Ser: 1.85 mg/dL — ABNORMAL HIGH (ref 0.61–1.24)
GFR, Estimated: 38 mL/min — ABNORMAL LOW (ref >60)
Glucose, Bld: 139 mg/dL — ABNORMAL HIGH (ref 70–99)
Potassium: 4.1 mmol/L (ref 3.5–5.1)
Sodium: 140 mmol/L (ref 135–145)
Total Bilirubin: 1.1 mg/dL (ref 0.3–1.2)
Total Protein: 6.2 g/dL — ABNORMAL LOW (ref 6.5–8.1)

## 2020-09-07 LAB — GLUCOSE, CAPILLARY
Glucose-Capillary: 78 mg/dL (ref 70–99)
Glucose-Capillary: 81 mg/dL (ref 70–99)
Glucose-Capillary: 81 mg/dL (ref 70–99)
Glucose-Capillary: 95 mg/dL (ref 70–99)
Glucose-Capillary: 89 mg/dL (ref 70–99)

## 2020-09-07 LAB — CBC WITH DIFFERENTIAL/PLATELET
Abs Immature Granulocytes: 0 10*3/uL (ref 0.00–0.07)
Basophils Absolute: 0 10*3/uL (ref 0.0–0.1)
Basophils Relative: 0 %
Eosinophils Absolute: 0 10*3/uL (ref 0.0–0.5)
Eosinophils Relative: 0 %
HCT: 42.7 % (ref 39.0–52.0)
Hemoglobin: 13.3 g/dL (ref 13.0–17.0)
Lymphocytes Relative: 1 %
Lymphs Abs: 0.2 10*3/uL — ABNORMAL LOW (ref 0.7–4.0)
MCH: 29.7 pg (ref 26.0–34.0)
MCHC: 31.1 g/dL (ref 30.0–36.0)
MCV: 95.3 fL (ref 80.0–100.0)
Monocytes Absolute: 0.7 10*3/uL (ref 0.1–1.0)
Monocytes Relative: 3 %
Neutro Abs: 23.4 10*3/uL — ABNORMAL HIGH (ref 1.7–7.7)
Neutrophils Relative %: 96 %
Platelets: 166 10*3/uL (ref 150–400)
RBC: 4.48 MIL/uL (ref 4.22–5.81)
RDW: 13.8 % (ref 11.5–15.5)
WBC: 24.4 10*3/uL — ABNORMAL HIGH (ref 4.0–10.5)
nRBC: 0 % (ref 0.0–0.2)
nRBC: 0 /100 WBC

## 2020-09-07 LAB — LACTIC ACID, PLASMA
Lactic Acid, Venous: 8.8 mmol/L (ref 0.5–1.9)
Lactic Acid, Venous: 9.2 mmol/L (ref 0.5–1.9)
Lactic Acid, Venous: 9.3 mmol/L (ref 0.5–1.9)

## 2020-09-07 LAB — ECHOCARDIOGRAM COMPLETE
Height: 69 in
S' Lateral: 3.9 cm
Weight: 3774.28 oz

## 2020-09-07 LAB — HEMOGLOBIN A1C
Hgb A1c MFr Bld: 5.8 % — ABNORMAL HIGH (ref 4.8–5.6)
Mean Plasma Glucose: 119.76 mg/dL

## 2020-09-07 LAB — MRSA PCR SCREENING: MRSA by PCR: NEGATIVE

## 2020-09-07 LAB — PROCALCITONIN: Procalcitonin: 85.64 ng/mL

## 2020-09-07 LAB — CORTISOL-AM, BLOOD: Cortisol - AM: 42.5 ug/dL — ABNORMAL HIGH (ref 6.7–22.6)

## 2020-09-07 MED ORDER — ENOXAPARIN SODIUM 30 MG/0.3ML ~~LOC~~ SOLN: 30.0000 mg | SUBCUTANEOUS | Status: DC

## 2020-09-07 MED ORDER — HEPARIN BOLUS VIA INFUSION
4000.0000 [IU] | Freq: Once | INTRAVENOUS | Status: AC
  Administered 2020-09-07: 13:00:00 4000 [IU] via INTRAVENOUS
  Filled 2020-09-07: qty 4000

## 2020-09-07 MED ORDER — HEPARIN (PORCINE) 25000 UT/250ML-% IV SOLN
1200.0000 [IU]/h | INTRAVENOUS | Status: DC
  Administered 2020-09-07: 13:00:00 1200 [IU]/h via INTRAVENOUS
  Filled 2020-09-07: qty 250

## 2020-09-07 MED ORDER — INFLUENZA VAC A&B SA ADJ QUAD 0.5 ML IM PRSY
0.5000 mL | PREFILLED_SYRINGE | INTRAMUSCULAR | Status: DC
  Filled 2020-09-07: qty 0.5

## 2020-09-07 MED ORDER — ORAL CARE MOUTH RINSE
15.0000 mL | Freq: Two times a day (BID) | OROMUCOSAL | Status: DC
  Administered 2020-09-07 – 2020-09-11 (×7): 15 mL via OROMUCOSAL

## 2020-09-07 MED ORDER — SODIUM CHLORIDE 0.9 % IV SOLN
2.0000 g | INTRAVENOUS | Status: DC
  Administered 2020-09-07 – 2020-09-08 (×2): 2 g via INTRAVENOUS
  Filled 2020-09-07: qty 20
  Filled 2020-09-07 (×2): qty 2

## 2020-09-07 MED ORDER — IOHEXOL 350 MG/ML SOLN
80.0000 mL | Freq: Once | INTRAVENOUS | Status: AC | PRN
  Administered 2020-09-07: 13:00:00 80 mL via INTRAVENOUS

## 2020-09-07 MED ORDER — CHLORHEXIDINE GLUCONATE CLOTH 2 % EX PADS
6.0000 | MEDICATED_PAD | Freq: Every day | CUTANEOUS | Status: DC
  Administered 2020-09-07 – 2020-09-10 (×4): 6 via TOPICAL

## 2020-09-07 MED ORDER — ALBUMIN HUMAN 25 % IV SOLN
12.5000 g | Freq: Once | INTRAVENOUS | Status: AC
  Administered 2020-09-07: 22:00:00 12.5 g via INTRAVENOUS
  Filled 2020-09-07: qty 50

## 2020-09-07 MED ORDER — CHLORHEXIDINE GLUCONATE 0.12 % MT SOLN
15.0000 mL | Freq: Two times a day (BID) | OROMUCOSAL | Status: DC
  Administered 2020-09-07 – 2020-09-11 (×10): 15 mL via OROMUCOSAL
  Filled 2020-09-07 (×8): qty 15

## 2020-09-07 NOTE — Progress Notes (Signed)
PHARMACY - PHYSICIAN COMMUNICATION CRITICAL VALUE ALERT - BLOOD CULTURE IDENTIFICATION (BCID)  [name] is an 74 y.o. male who presented to [facility] on 09/06/2020 with a chief complaint of altered mental status.   Assessment:  74 year old male presents to [facility] with altered mental status. Per critical care patient back close to baseline after hydration overnight. Blood cultures positive and antibiotics are being adjusted.  Name of physician (or Provider) Contacted: Agarwala  Current antibiotics: vancomycin, cefepime, acyclovir  Changes to prescribed antibiotics recommended:  Recommendations accepted by provider, will narrow antibiotics to ceftriaxone monotherapy.    Results for orders placed or performed during the hospital encounter of 09/06/20  Blood Culture ID Panel (Reflexed) (Collected: 09/06/2020 11:04 AM)  Result Value Ref Range   Enterococcus faecalis NOT DETECTED NOT DETECTED   Enterococcus Faecium NOT DETECTED NOT DETECTED   Listeria monocytogenes NOT DETECTED NOT DETECTED   Staphylococcus species NOT DETECTED NOT DETECTED   Staphylococcus aureus (BCID) NOT DETECTED NOT DETECTED   Staphylococcus epidermidis NOT DETECTED NOT DETECTED   Staphylococcus lugdunensis NOT DETECTED NOT DETECTED   Streptococcus species NOT DETECTED NOT DETECTED   Streptococcus agalactiae NOT DETECTED NOT DETECTED   Streptococcus pneumoniae NOT DETECTED NOT DETECTED   Streptococcus pyogenes NOT DETECTED NOT DETECTED   A.calcoaceticus-baumannii NOT DETECTED NOT DETECTED   Bacteroides fragilis NOT DETECTED NOT DETECTED   Enterobacterales DETECTED (A) NOT DETECTED   Enterobacter cloacae complex NOT DETECTED NOT DETECTED   Escherichia coli NOT DETECTED NOT DETECTED   Klebsiella aerogenes NOT DETECTED NOT DETECTED   Klebsiella oxytoca NOT DETECTED NOT DETECTED   Klebsiella pneumoniae DETECTED (A) NOT DETECTED   Proteus species NOT DETECTED NOT DETECTED   Salmonella species NOT DETECTED NOT  DETECTED   Serratia marcescens NOT DETECTED NOT DETECTED   Haemophilus influenzae NOT DETECTED NOT DETECTED   Neisseria meningitidis NOT DETECTED NOT DETECTED   Pseudomonas aeruginosa NOT DETECTED NOT DETECTED   Stenotrophomonas maltophilia NOT DETECTED NOT DETECTED   Candida albicans NOT DETECTED NOT DETECTED   Candida auris NOT DETECTED NOT DETECTED   Candida glabrata NOT DETECTED NOT DETECTED   Candida krusei NOT DETECTED NOT DETECTED   Candida parapsilosis NOT DETECTED NOT DETECTED   Candida tropicalis NOT DETECTED NOT DETECTED   Cryptococcus neoformans/gattii NOT DETECTED NOT DETECTED   CTX-M ESBL NOT DETECTED NOT DETECTED   Carbapenem resistance IMP NOT DETECTED NOT DETECTED   Carbapenem resistance KPC NOT DETECTED NOT DETECTED   Carbapenem resistance NDM NOT DETECTED NOT DETECTED   Carbapenem resist OXA 48 LIKE NOT DETECTED NOT DETECTED   Carbapenem resistance VIM NOT DETECTED NOT DETECTED   Erin Hearing PharmD., BCPS Clinical Pharmacist 09/07/2020 12:28 PM

## 2020-09-07 NOTE — Progress Notes (Signed)
MRSA PCR negative.

## 2020-09-07 NOTE — Progress Notes (Signed)
Patient taken to MRI accompanied with Gasper Lloyd, RN and transport tech. Alert and oriented to self only. Stated no pain.   Upon arriving to MRI, patient was not following commands and was pulling at lines and devices inside of MRI machine. Patient was placed back into bed and the MRI was incomplete.

## 2020-09-07 NOTE — Progress Notes (Signed)
[facility] Progress Note Patient Name: [name] DOB: [birth year] MRN: [id number]   Date of Service  09/07/2020  HPI/Events of Note  Hypotension - BP = 81/49 with MAP = 60. Albumin = 2.5. LVEF = 55-60%.  eICU Interventions  Plan: 1. Bolus with 25% Albmin 15.2 gm IV now. 2. Restart Norepinephrine IV infusion. Titrate to MAP >= 65.      Intervention Category Major Interventions: Hypotension - evaluation and management  Lysle Dingwall 09/07/2020, 9:14 PM

## 2020-09-07 NOTE — H&P (Signed)
NAME:  [name], MRN:  [id number], DOB:  [birth year], LOS: 1 ADMISSION DATE:  09/06/2020, CONSULTATION DATE: 09/06/20 REFERRING MD:  Clearence Ped, CHIEF COMPLAINT:  Altered mental status  Brief History   MR. Daffin is a 74 year old man with dementia, HTN, HLD, hx colon cancer stage 3, lives in nursing home Brought to [place] for [place].   History of present illness   Per review of chart:  Seen normal at 820am  when he took his meds.  Became acutely unresponsive  Decorticate posturing noticed on arrival by EMS. L facial droop, non verbal not moving extremities - per notes.  Slurred speech. Hypoxemic.  Non ambulatory at baseline, not oriented to time, per notes.    Mental status improving with IV fluids and antibiotics.    Dr. Melina Copa did an LP that revealed 155 protein, 94 glucose, 2 red blood cells and 3 white blood cells. UA was significant for  white blood cell count of 11-20, rare bacteria, large leukocytes. Tele Neurology - no aphasia out of proportion to enephalopathy. (limited exam on tele). Rec MRI brain with and without contrast.    +4.5 L  No UOP recorded  Past Medical History  Dementia (per chart disoriented to time at baseline?, cannot give PMH).  Hard of hearing HLD HTN Obestiy Hx GIB, ulcer Colon cancer stage 3 - per notes had resection, no chemo due to poor functional status.    Lipitor, atenolol, lasix 40 daily, metformin  Echo 04/2019 1. The left ventricle has a visually estimated ejection fraction of 60%.  The cavity size was normal. There is mild concentric left ventricular  hypertrophy. Diastolic dysfunction, grade indeterminate. Elevated left  ventricular end-diastolic pressure No evidence of left ventricular regional wall motion abnormalities.  2. The right ventricle has normal systolic function. The cavity was  normal. There is no increase in right ventricular wall thickness.  Significant Hospital Events   Transferred to [facility] for further care  12/11  Consults:    Procedures:    Significant Diagnostic Tests:  Cxr:  No active disease.   Micro Data:  LP Glucose 94 protein 155 WBC 3, RBC 2 CXR culture P x2  Blood cultures - growing Kpneumoniae  Urine culture pending.  Antimicrobials:  Acyclovir 12/11- 12/12 Cefepime 12/11-  Metronidazole 12/11  -12/12 Vancomycin 12/11 - 12/12  Interim history/subjective:  Awake and appears near baseline. Oriented to self only, but will follow commands. Complains of abdominal pain.   Objective   Blood pressure (!) 141/55, pulse (!) 102, temperature (!) 97.5 F (36.4 C), temperature source Oral, resp. rate (!) 26, height 5\' 9"  (1.753 m), weight 107 kg, SpO2 96 %.        Intake/Output Summary (Last 24 hours) at 09/07/2020 1107 Last data filed at 09/07/2020 1000 Gross per 24 hour  Intake 7761.94 ml  Output 250 ml  Net 7511.94 ml   Filed Weights   09/06/20 1029 09/07/20 0800  Weight: 107 kg 107 kg    Examination: General: NAD, lethargic but arousable HENT: perrl, mmm, no nuchal rigidity.  Lungs: CTAB Cardiovascular: RRR no mgr   Abdomen: diffusely tender. Extremities: no edema, no erythema Neuro: non focal, arousable, moving all extremities.  GU: condom cath  Skin, well perfused no rash  Resolved Hospital Problem list     Assessment & Plan:   Severe sepsis with persistently elevated lactate despite now normal blood pressure and improving urine output Acute toxic-metabolic encephalopathy without focality.  AKI due to hypotension or  possible UTI  Plan: - serial lactated - gentle fluids - abdominal imaging to look for source, rule out intestinal ischemia given persistently elevated creatinine - stop meningitis coverage as presentation and LP not compatible.     Best practice (evaluated daily)   Diet: NPO Pain/Anxiety/Delirium protocol (if indicated):  VAP protocol (if indicated):  DVT prophylaxis: heparin GI prophylaxis:  Glucose control:  Mobility:   last date of multidisciplinary goals of care discussion - goals of care need to be established. Family and staff present  Summary of discussion  Follow up goals of care discussion due Code Status: Full Disposition: ICU  Labs   CBC: Recent Labs  Lab 09/06/20 1014 09/06/20 1048 09/07/20 0830  WBC 4.5  --  24.4*  NEUTROABS 4.0  --  23.4*  HGB 15.8 16.7 13.3  HCT 49.0 49.0 42.7  MCV 93.0  --  95.3  PLT 214  --  353    Basic Metabolic Panel: Recent Labs  Lab 09/06/20 1014 09/06/20 1048 09/07/20 0830  NA 138 142 140  K 4.3 4.4 4.1  CL 100 104 101  CO2 18*  --  18*  GLUCOSE 186* 179* 139*  BUN 20 19 26*  CREATININE 1.53* 1.30* 1.85*  CALCIUM 8.8*  --  8.3*   GFR: Estimated Creatinine Clearance: 41.6 mL/min (A) (by C-G formula based on SCr of 1.85 mg/dL (H)). Recent Labs  Lab 09/06/20 1014 09/06/20 1045 09/06/20 1625 09/06/20 2358 09/07/20 0227 09/07/20 0228 09/07/20 0830  PROCALCITON  --   --   --   --   --  85.64  --   WBC 4.5  --   --   --   --   --  24.4*  LATICACIDVEN  --    < > 8.9* 9.2* 9.3*  --  8.8*   < > = values in this interval not displayed.    Liver Function Tests: Recent Labs  Lab 09/06/20 1014 09/07/20 0830  AST 38 49*  ALT 44 24  ALKPHOS 138* 75  BILITOT 1.5* 1.1  PROT 8.1 6.2*  ALBUMIN 3.4* 2.5*   No results for input(s): LIPASE, AMYLASE in the last 168 hours. No results for input(s): AMMONIA in the last 168 hours.  ABG    Component Value Date/Time   HCO3 21.7 09/06/2020 1110   TCO2 18 (L) 09/06/2020 1048   ACIDBASEDEF 4.7 (H) 09/06/2020 1110   O2SAT 95.8 09/06/2020 1110     Coagulation Profile: Recent Labs  Lab 09/06/20 1014  INR 1.2    Cardiac Enzymes: No results for input(s): CKTOTAL, CKMB, CKMBINDEX, TROPONINI in the last 168 hours.  HbA1C: Hgb A1c MFr Bld  Date/Time Value Ref Range Status  05/08/2019 09:28 AM 5.5 4.8 - 5.6 % Final    Comment:    (NOTE) Pre diabetes:          5.7%-6.4% Diabetes:               >6.4% Glycemic control for   <7.0% adults with diabetes   05/02/2018 05:24 AM 6.2 (H) 4.8 - 5.6 % Final    Comment:    (NOTE) Pre diabetes:          5.7%-6.4% Diabetes:              >6.4% Glycemic control for   <7.0% adults with diabetes     CBG: Recent Labs  Lab 09/06/20 1028 09/06/20 2336 09/07/20 0427 09/07/20 0727  GLUCAP 176* 106* 78 81   Eulice Rutledge  Lynetta Mare, MD [facility] ICU Physician [place]  Pager: [phone] Or Epic Secure Chat After hours: [phone].  09/07/2020, 11:16 AM

## 2020-09-07 NOTE — Progress Notes (Signed)
ANTICOAGULATION CONSULT NOTE - Initial Consult  Pharmacy Consult for heparin Indication: intestinal ischemia  Allergies  Allergen Reactions  . Hydrocodone   . Hydrocodone-Acetaminophen     unknown  . Aplisol [Tuberculin Ppd] Rash    Patient Measurements: Height: 5\' 9"  (175.3 cm) Weight: 107 kg (235 lb 14.3 oz) IBW/kg (Calculated) : 70.7 Heparin Dosing Weight: 94kg  Vital Signs: Temp: 100.8 F (38.2 C) (12/12 1200) Temp Source: Axillary (12/12 1200) BP: 148/75 (12/12 1200) Pulse Rate: 104 (12/12 1200)  Labs: Recent Labs    09/06/20 1014 09/06/20 1048 09/07/20 0830  HGB 15.8 16.7 13.3  HCT 49.0 49.0 42.7  PLT 214  --  166  APTT 29  --   --   LABPROT 14.3  --   --   INR 1.2  --   --   CREATININE 1.53* 1.30* 1.85*    Estimated Creatinine Clearance: 41.6 mL/min (A) (by C-G formula based on SCr of 1.85 mg/dL (H)).   Medical History: Past Medical History:  Diagnosis Date  . Bell's palsy   . Bleeding ulcer    remote past  . Dementia    disoriented to time.does not know medical hx.information obtained from previous charts  . Hard of hearing   . Hyperlipemia   . Hypertension   . Obesity   . SOB (shortness of breath) on exertion     Assessment: 74 year old male presents to [facility] with altered mental status. Per critical care patient back close to baseline after hydration overnight. Blood cultures positive and antibiotics are being adjusted. Patient complaining of abdominal pain, plans are for imaging and new orders received to start IV heparin for possible intestinal ischemia with persistently elevated creatinine.   Not on anticoagulation prior to admit, blood counts are within normal limits. He has been receiving sq heparin for prophylaxis.   Goal of Therapy:  Heparin level 0.3-0.7 units/ml Monitor platelets by anticoagulation protocol: Yes   Plan:  Give 4000 units bolus x 1 Start heparin infusion at 1200 units/hr Check anti-Xa level in 8 hours and daily  while on heparin Continue to monitor H&H and platelets  Erin Hearing PharmD., BCPS Clinical Pharmacist 09/07/2020 12:19 PM

## 2020-09-07 NOTE — Progress Notes (Signed)
  Echocardiogram 2D Echocardiogram has been performed.  [name] 09/07/2020, 3:13 PM

## 2020-09-07 NOTE — Progress Notes (Signed)
Critical care attending progress note  CT abdomen pelvis consistent with pyelonephritis due to partial obstructing stone also likely has an obstructive uropathy.  No evidence of mesenteric ischemia.  Currently off vasopressors on appropriate antibiotics  Can stop heparin at this time  May need urology consult in a.m. although given poor premorbid functional status interventions may be limited.  Kipp Brood, MD [facility] ICU Physician [place]  Pager: [phone] Or Epic Secure Chat After hours: [phone].  09/07/2020, 5:30 PM

## 2020-09-07 NOTE — Progress Notes (Signed)
eLink Physician-Brief Progress Note Patient Name: [name] DOB: 10-27-1945 MRN: [id number]   Date of Service  09/07/2020  HPI/Events of Note  Patient admitted with acute encephalopathy likely secondary to septic shock from possibly a urinary tract origin, work up  Is in progress. There was also a concern for possible CVA vs meningitis vs metastatic cancer to the brain, work up in progress.  eICU Interventions  New Patient Evaluation completed.        Kerry Kass Nayara Taplin 09/07/2020, 5:09 AM

## 2020-09-07 NOTE — ED Notes (Signed)
CRITICAL VALUE ALERT  Critical Value:  Lactic Acid 9.3 Date & Time Notied:  09/07/20 @ 0301 Provider Notified:None- [facility] transported to [facility] Orders Received/Actions taken: N/A

## 2020-09-07 NOTE — ED Notes (Signed)
CRITICAL VALUE ALERT  Critical Value:  Gram - rods both sets of blood cultures Date & Time Notied: 09/07/20 @ 0347 Provider Notified: Dr. Domingo Cocking Orders Received/Actions taken:None yet

## 2020-09-07 NOTE — Progress Notes (Signed)
  Echocardiogram 2D Echocardiogram has been performed.  [name] 09/07/2020, 10:31 AM

## 2020-09-07 NOTE — Progress Notes (Signed)
Attempted MRI brain w wo contrast, pt unable to tolerate scan at this time, does not have any sedation meds per RN at this time. I was able to get motion degraded 1 sequence only.

## 2020-09-07 NOTE — Plan of Care (Signed)

## 2020-09-08 ENCOUNTER — Encounter (HOSPITAL_COMMUNITY)
Admission: EM | Disposition: A | Discharge status: Skilled Nursing Facility | Payer: Self-pay | Source: Home / Self Care | Attending: Internal Medicine

## 2020-09-08 ENCOUNTER — Encounter (HOSPITAL_COMMUNITY): Payer: Self-pay | Admitting: Pulmonary Disease

## 2020-09-08 ENCOUNTER — Inpatient Hospital Stay (HOSPITAL_COMMUNITY): Payer: Medicare (Managed Care) | Admitting: Anesthesiology

## 2020-09-08 ENCOUNTER — Other Ambulatory Visit: Payer: Self-pay | Admitting: Urology

## 2020-09-08 ENCOUNTER — Inpatient Hospital Stay (HOSPITAL_COMMUNITY): Payer: Medicare (Managed Care)

## 2020-09-08 DIAGNOSIS — A419 Sepsis, unspecified organism: Secondary | ICD-10-CM

## 2020-09-08 HISTORY — PX: CYSTOSCOPY W/ URETERAL STENT PLACEMENT: SHX1429

## 2020-09-08 LAB — COMPREHENSIVE METABOLIC PANEL
ALT: 22 U/L (ref 0–44)
AST: 36 U/L (ref 15–41)
Albumin: 2.4 g/dL — ABNORMAL LOW (ref 3.5–5.0)
Alkaline Phosphatase: 67 U/L (ref 38–126)
Anion gap: 16 — ABNORMAL HIGH (ref 5–15)
BUN: 34 mg/dL — ABNORMAL HIGH (ref 8–23)
CO2: 23 mmol/L (ref 22–32)
Calcium: 8 mg/dL — ABNORMAL LOW (ref 8.9–10.3)
Chloride: 102 mmol/L (ref 98–111)
Creatinine, Ser: 1.84 mg/dL — ABNORMAL HIGH (ref 0.61–1.24)
GFR, Estimated: 38 mL/min — ABNORMAL LOW (ref >60)
Glucose, Bld: 104 mg/dL — ABNORMAL HIGH (ref 70–99)
Potassium: 4.3 mmol/L (ref 3.5–5.1)
Sodium: 141 mmol/L (ref 135–145)
Total Bilirubin: 1 mg/dL (ref 0.3–1.2)
Total Protein: 5.7 g/dL — ABNORMAL LOW (ref 6.5–8.1)

## 2020-09-08 LAB — URINE CULTURE
Culture: <10000 — AB
Culture: <10000 — AB

## 2020-09-08 LAB — GLUCOSE, CAPILLARY
Glucose-Capillary: 68 mg/dL — ABNORMAL LOW (ref 70–99)
Glucose-Capillary: 73 mg/dL (ref 70–99)
Glucose-Capillary: 79 mg/dL (ref 70–99)
Glucose-Capillary: 82 mg/dL (ref 70–99)
Glucose-Capillary: 84 mg/dL (ref 70–99)

## 2020-09-08 LAB — CBC
HCT: 37.3 % — ABNORMAL LOW (ref 39.0–52.0)
Hemoglobin: 11.8 g/dL — ABNORMAL LOW (ref 13.0–17.0)
MCH: 29.7 pg (ref 26.0–34.0)
MCHC: 31.6 g/dL (ref 30.0–36.0)
MCV: 94 fL (ref 80.0–100.0)
Platelets: 137 10*3/uL — ABNORMAL LOW (ref 150–400)
RBC: 3.97 MIL/uL — ABNORMAL LOW (ref 4.22–5.81)
RDW: 14 % (ref 11.5–15.5)
WBC: 17.7 10*3/uL — ABNORMAL HIGH (ref 4.0–10.5)
nRBC: 0 % (ref 0.0–0.2)

## 2020-09-08 LAB — LACTIC ACID, PLASMA: Lactic Acid, Venous: 2.3 mmol/L (ref 0.5–1.9)

## 2020-09-08 SURGERY — CYSTOSCOPY, WITH RETROGRADE PYELOGRAM AND URETERAL STENT INSERTION
Anesthesia: General | Site: Ureter | Laterality: Right

## 2020-09-08 MED ORDER — LIDOCAINE 2% (20 MG/ML) 5 ML SYRINGE
INTRAMUSCULAR | Status: DC | PRN
  Administered 2020-09-08: 100 mg via INTRAVENOUS

## 2020-09-08 MED ORDER — CHLORHEXIDINE GLUCONATE 0.12 % MT SOLN
OROMUCOSAL | Status: AC
  Administered 2020-09-08: 17:00:00 15 mL via OROMUCOSAL
  Filled 2020-09-08: qty 15

## 2020-09-08 MED ORDER — CHLORHEXIDINE GLUCONATE 0.12 % MT SOLN
OROMUCOSAL | Status: AC
  Filled 2020-09-08: qty 15

## 2020-09-08 MED ORDER — DEXTROSE 50 % IV SOLN
INTRAVENOUS | Status: AC
  Administered 2020-09-09: 25 mL via INTRAVENOUS
  Filled 2020-09-08: qty 50

## 2020-09-08 MED ORDER — ONDANSETRON HCL 4 MG/2ML IJ SOLN: 4.0000 mg | Freq: Once | INTRAMUSCULAR | Status: DC | PRN

## 2020-09-08 MED ORDER — ACETAMINOPHEN 500 MG PO TABS: 1000.0000 mg | ORAL_TABLET | Freq: Once | ORAL | Status: DC

## 2020-09-08 MED ORDER — DEXTROSE 50 % IV SOLN: 25.0000 mL | Freq: Once | INTRAVENOUS | Status: AC

## 2020-09-08 MED ORDER — DEXAMETHASONE SODIUM PHOSPHATE 10 MG/ML IJ SOLN
INTRAMUSCULAR | Status: AC
  Filled 2020-09-08: qty 1

## 2020-09-08 MED ORDER — ENSURE ENLIVE PO LIQD
237.0000 mL | Freq: Two times a day (BID) | ORAL | Status: DC
  Administered 2020-09-09 – 2020-09-11 (×5): 237 mL via ORAL

## 2020-09-08 MED ORDER — IOHEXOL 300 MG/ML  SOLN
INTRAMUSCULAR | Status: DC | PRN
  Administered 2020-09-08: 18:00:00 7 mL via URETHRAL

## 2020-09-08 MED ORDER — PROPOFOL 10 MG/ML IV BOLUS
INTRAVENOUS | Status: DC | PRN
  Administered 2020-09-08: 30 mg via INTRAVENOUS
  Administered 2020-09-08: 40 mg via INTRAVENOUS

## 2020-09-08 MED ORDER — FENTANYL CITRATE (PF) 100 MCG/2ML IJ SOLN
INTRAMUSCULAR | Status: AC
  Filled 2020-09-08: qty 2

## 2020-09-08 MED ORDER — LIDOCAINE HCL URETHRAL/MUCOSAL 2 % EX GEL
CUTANEOUS | Status: AC
  Filled 2020-09-08: qty 11

## 2020-09-08 MED ORDER — WATER FOR IRRIGATION, STERILE IR SOLN
Status: DC | PRN
  Administered 2020-09-08: 3000 mL via SURGICAL_CAVITY

## 2020-09-08 MED ORDER — CHLORHEXIDINE GLUCONATE 0.12 % MT SOLN: 15.0000 mL | Freq: Once | OROMUCOSAL | Status: AC

## 2020-09-08 MED ORDER — FENTANYL CITRATE (PF) 100 MCG/2ML IJ SOLN: 25.0000 ug | INTRAMUSCULAR | Status: DC | PRN

## 2020-09-08 MED ORDER — LACTATED RINGERS IV BOLUS
1000.0000 mL | Freq: Once | INTRAVENOUS | Status: AC
  Administered 2020-09-08: 13:00:00 1000 mL via INTRAVENOUS

## 2020-09-08 MED ORDER — ONDANSETRON HCL 4 MG/2ML IJ SOLN
INTRAMUSCULAR | Status: DC | PRN
  Administered 2020-09-08: 4 mg via INTRAVENOUS

## 2020-09-08 MED ORDER — ONDANSETRON HCL 4 MG/2ML IJ SOLN
INTRAMUSCULAR | Status: AC
  Filled 2020-09-08: qty 2

## 2020-09-08 MED ORDER — LACTATED RINGERS IV SOLN: INTRAVENOUS | Status: DC

## 2020-09-08 MED ORDER — PROPOFOL 10 MG/ML IV BOLUS
INTRAVENOUS | Status: AC
  Filled 2020-09-08: qty 20

## 2020-09-08 SURGICAL SUPPLY — 25 items
BAG DRN RND TRDRP ANRFLXCHMBR (UROLOGICAL SUPPLIES) ×2
BAG URINE DRAIN 2000ML AR STRL (UROLOGICAL SUPPLIES) ×4 IMPLANT
BAG URO CATCHER STRL LF (MISCELLANEOUS) ×4 IMPLANT
CATH FOLEY 2WAY SLVR  5CC 16FR (CATHETERS) ×4
CATH FOLEY 2WAY SLVR 5CC 16FR (CATHETERS) ×2 IMPLANT
CATH URET 5FR 28IN OPEN ENDED (CATHETERS) ×4 IMPLANT
GLOVE SURG SS PI 8.0 STRL IVOR (GLOVE) ×4 IMPLANT
GOWN STRL REUS W/ TWL LRG LVL3 (GOWN DISPOSABLE) ×2 IMPLANT
GOWN STRL REUS W/ TWL XL LVL3 (GOWN DISPOSABLE) ×2 IMPLANT
GOWN STRL REUS W/TWL LRG LVL3 (GOWN DISPOSABLE) ×4
GOWN STRL REUS W/TWL XL LVL3 (GOWN DISPOSABLE) ×4
GUIDEWIRE ANG ZIPWIRE 038X150 (WIRE) IMPLANT
GUIDEWIRE STR DUAL SENSOR (WIRE) ×4 IMPLANT
KIT TURNOVER KIT B (KITS) ×4 IMPLANT
MANIFOLD NEPTUNE II (INSTRUMENTS) IMPLANT
NS IRRIG 1000ML POUR BTL (IV SOLUTION) IMPLANT
PACK CYSTO (CUSTOM PROCEDURE TRAY) ×4 IMPLANT
SET IRRIG Y TYPE TUR BLADDER L (SET/KITS/TRAYS/PACK) IMPLANT
STENT URET 6FRX24 CONTOUR (STENTS) ×4 IMPLANT
STENT URET 6FRX26 CONTOUR (STENTS) IMPLANT
SYPHON OMNI JUG (MISCELLANEOUS) ×4 IMPLANT
TOWEL GREEN STERILE FF (TOWEL DISPOSABLE) ×4 IMPLANT
TUBE CONNECTING 12'X1/4 (SUCTIONS)
TUBE CONNECTING 12X1/4 (SUCTIONS) IMPLANT
WATER STERILE IRR 3000ML UROMA (IV SOLUTION) ×4 IMPLANT

## 2020-09-08 NOTE — Progress Notes (Signed)
NAME:  [name], MRN:  [id number], DOB:  Apr 25, 1946, LOS: 2 ADMISSION DATE:  09/06/2020, CONSULTATION DATE: 09/06/20 REFERRING MD:  Clearence Ped, CHIEF COMPLAINT:  Altered mental status  Brief History   [name] is a 74 year old man with dementia, HTN, HLD, hx colon cancer stage 3, lives in nursing home Brought to [place] for [place] found to have gram-negative bacteremia and CT scan suggestive of pyelonephritis with obstructive uropathy. Past Medical History  Dementia (per chart disoriented to time at baseline?, cannot give PMH).  Hard of hearing HLD HTN Obestiy Hx GIB, ulcer Colon cancer stage 3 - per notes had resection, no chemo due to poor functional status.    Lipitor, atenolol, lasix 40 daily, metformin  Echo 04/2019 1. The left ventricle has a visually estimated ejection fraction of 60%.  The cavity size was normal. There is mild concentric left ventricular  hypertrophy. Diastolic dysfunction, grade indeterminate. Elevated left  ventricular end-diastolic pressure No evidence of left ventricular regional wall motion abnormalities.  2. The right ventricle has normal systolic function. The cavity was  normal. There is no increase in right ventricular wall thickness.  Significant Hospital Events   Transferred to [facility] for further care 12/11  Consults:  Urology  Procedures:    Significant Diagnostic Tests:  12/12 CT abdomen and pelvis with contrast:  No evidenceof mesenteric ischemia. There is a 7 mm calculus in the proximal third of right ureter with mild associated hydronephrosis. Reactive fat stranding about the right kidney and the adjacent transverse portion of the duodenum. Multiple additional small bilateral renal calculi. Wall thickening and multiple small diverticula of the urinary bladder, suggestive of chronic outlet obstruction.  Micro Data:   Blood cultures -Klebsiella/Enterobacter Urine culture: Insignificant growth  Antimicrobials:  Acyclovir 12/11-  12/12 Cefepime 12/11-  Metronidazole 12/11  -12/12 Vancomycin 12/11 - 12/12 Interim history/subjective:  Patient is c/o abdominal pain denies nausea, vomiting, headache or other complaints  Objective   Blood pressure 122/70, pulse 86, temperature 98.7 F (37.1 C), temperature source Oral, resp. rate (!) 24, height 5\' 9"  (1.753 m), weight 107 kg, SpO2 98 %.        Intake/Output Summary (Last 24 hours) at 09/08/2020 1221 Last data filed at 09/08/2020 0900 Gross per 24 hour  Intake 247.75 ml  Output 550 ml  Net -302.25 ml   Filed Weights   09/06/20 1029 09/07/20 0800  Weight: 107 kg 107 kg    Examination: General: Elderly morbidly obese Caucasian male, lying in the bed HENT: Atraumatic, normocephalic, moist mucous membranes Lungs: CTAB, no wheezes or rhonchi Cardiovascular: RRR, no murmur or gallop  Abdomen: diffusely tender. Extremities: no edema, no erythema Neuro: Awake, confused, following intermittently. Moving all 4 extremities spontaneously GU: condom cath  Skin, well perfused, no rash  Resolved Hospital Problem list     Assessment & Plan:   Severe sepsis due to acute pyelonephritis with persistently elevated lactate despite now normal blood pressure and improving urine output Right ureteral stone with mild to moderate hydronephrosis Acute toxic-metabolic encephalopathy Klebsiella/Enterobacter bacteremia AKI due to hypotension and obstructive uropathy High anion gap metabolic acidosis  Patient became afebrile, his white count is trending down Lactate has trended down from 9.3-8.8, repeat lactate is pending CT abdomen and pelvis confirmed right ureteral stone with obstructive uropathy and hydronephrosis, urology consult is requested, recommend keeping patient n.p.o. for possible ureteral stent and cystoscopy today Blood culture is growing Klebsiella and Enterobacter, likely source acute pyelonephritis Started on IV ceftriaxone Patient  mental status is slightly  better but is still he is confused likely from infection and high anion gap metabolic acidosis Patient serum creatinine is a stable, his baseline is 0.6, now it is 1.8 Continue gentle IV fluid hydration Monitor closely Acute meningitis was ruled out  Best practice (evaluated daily)   Diet: NPO for possible ureteral stent Pain/Anxiety/Delirium protocol (if indicated): N/A VAP protocol (if indicated): N/A DVT prophylaxis: heparin GI prophylaxis: N/A Glucose control: SSI Mobility: As tolerated Code Status: Full Disposition: MedSurg floor  Labs   CBC: Recent Labs  Lab 09/06/20 1014 09/06/20 1048 09/07/20 0830 09/08/20 0118  WBC 4.5  --  24.4* 17.7*  NEUTROABS 4.0  --  23.4*  --   HGB 15.8 16.7 13.3 11.8*  HCT 49.0 49.0 42.7 37.3*  MCV 93.0  --  95.3 94.0  PLT 214  --  166 137*    Basic Metabolic Panel: Recent Labs  Lab 09/06/20 1014 09/06/20 1048 09/07/20 0830 09/08/20 0118  NA 138 142 140 141  K 4.3 4.4 4.1 4.3  CL 100 104 101 102  CO2 18*  --  18* 23  GLUCOSE 186* 179* 139* 104*  BUN 20 19 26* 34*  CREATININE 1.53* 1.30* 1.85* 1.84*  CALCIUM 8.8*  --  8.3* 8.0*   GFR: Estimated Creatinine Clearance: 41.8 mL/min (A) (by C-G formula based on SCr of 1.84 mg/dL (H)). Recent Labs  Lab 09/06/20 1014 09/06/20 1045 09/06/20 1625 09/06/20 2358 09/07/20 0227 09/07/20 0228 09/07/20 0830 09/08/20 0118  PROCALCITON  --   --   --   --   --  85.64  --   --   WBC 4.5  --   --   --   --   --  24.4* 17.7*  LATICACIDVEN  --    < > 8.9* 9.2* 9.3*  --  8.8*  --    < > = values in this interval not displayed.    Liver Function Tests: Recent Labs  Lab 09/06/20 1014 09/07/20 0830 09/08/20 0118  AST 38 49* 36  ALT 44 24 22  ALKPHOS 138* 75 67  BILITOT 1.5* 1.1 1.0  PROT 8.1 6.2* 5.7*  ALBUMIN 3.4* 2.5* 2.4*   No results for input(s): LIPASE, AMYLASE in the last 168 hours. No results for input(s): AMMONIA in the last 168 hours.  ABG    Component Value  Date/Time   HCO3 21.7 09/06/2020 1110   TCO2 18 (L) 09/06/2020 1048   ACIDBASEDEF 4.7 (H) 09/06/2020 1110   O2SAT 95.8 09/06/2020 1110     Coagulation Profile: Recent Labs  Lab 09/06/20 1014  INR 1.2    Cardiac Enzymes: No results for input(s): CKTOTAL, CKMB, CKMBINDEX, TROPONINI in the last 168 hours.  HbA1C: Hgb A1c MFr Bld  Date/Time Value Ref Range Status  09/06/2020 11:57 PM 5.8 (H) 4.8 - 5.6 % Final    Comment:    (NOTE) Pre diabetes:          5.7%-6.4%  Diabetes:              >6.4%  Glycemic control for   <7.0% adults with diabetes   05/08/2019 09:28 AM 5.5 4.8 - 5.6 % Final    Comment:    (NOTE) Pre diabetes:          5.7%-6.4% Diabetes:              >6.4% Glycemic control for   <7.0% adults with diabetes     CBG:  Recent Labs  Lab 09/07/20 1128 09/07/20 1549 09/07/20 2251 09/08/20 0652 09/08/20 1144  GLUCAP 81 95 89 82 84    Total critical care time: 35 minutes  Performed by: Kreamer care time was exclusive of separately billable procedures and treating other patients.   Critical care was necessary to treat or prevent imminent or life-threatening deterioration.   Critical care was time spent personally by me on the following activities: development of treatment plan with patient and/or surrogate as well as nursing, discussions with consultants, evaluation of patient's response to treatment, examination of patient, obtaining history from patient or surrogate, ordering and performing treatments and interventions, ordering and review of laboratory studies, ordering and review of radiographic studies, pulse oximetry and re-evaluation of patient's condition.   Jacky Kindle MD [facility] Pager: [phone] Mobile: [phone]

## 2020-09-08 NOTE — Progress Notes (Signed)
Report called to receiving RN [phone]. PAtient currently in a procedural area having a cyscoscopy with renal stent placement.

## 2020-09-08 NOTE — Anesthesia Preprocedure Evaluation (Addendum)
Anesthesia Evaluation  Patient identified by MRN, date of birth, ID band Patient awake    Reviewed: Allergy & Precautions, NPO status , Patient's Chart, lab work & pertinent test results, reviewed documented beta blocker date and time   Airway Mallampati: III  TM Distance: >3 FB Neck ROM: Full    Dental  (+) Edentulous Upper, Edentulous Lower   Pulmonary former smoker,  30 pack year history    Pulmonary exam normal        Cardiovascular hypertension, Pt. on home beta blockers Normal cardiovascular exam  Echo 09/07/20: 1. Limited windows for image acquisition and forshortened images limit  accurate assessment of LV function but grossly appears normal.  2. Left ventricular ejection fraction, by estimation, is 55 to 60%. The  left ventricle has normal function. Left ventricular endocardial border  not optimally defined to evaluate regional wall motion. Left ventricular  diastolic function could not be  evaluated.  3. Right ventricular systolic function was not well visualized. The right  ventricular size is not well visualized. Tricuspid regurgitation signal is  inadequate for assessing PA pressure.  4. The mitral valve is normal in structure. No evidence of mitral valve  regurgitation. No evidence of mitral stenosis.  5. The aortic valve is normal in structure. Aortic valve regurgitation is  not visualized. No aortic stenosis is present.  6. The inferior vena cava is normal in size with greater than 50%  respiratory variability, suggesting right atrial pressure of 3 mmHg.    Neuro/Psych PSYCHIATRIC DISORDERS Dementia negative neurological ROS     GI/Hepatic Neg liver ROS, PUD, Hx CRC   Endo/Other  Obesity BMI 35  Renal/GU CRF and ARFRenal diseaseCr 1.84    pyelonephritis due to partial obstructing stone also likely has an obstructive uropathy    Musculoskeletal negative musculoskeletal ROS (+)   Abdominal    Peds  Hematology  (+) anemia , Hgb 11.8   Anesthesia Other Findings HOH  Admitted with urosepsis/septic shock, initially on norepi but now d/c'ed. On RA.  Reproductive/Obstetrics negative OB ROS                           Anesthesia Physical Anesthesia Plan  ASA: IV  Anesthesia Plan: General   Post-op Pain Management:    Induction: Intravenous  PONV Risk Score and Plan: 3 and Ondansetron, Treatment may vary due to age or medical condition and Dexamethasone  Airway Management Planned: LMA  Additional Equipment: None  Intra-op Plan:   Post-operative Plan: Extubation in OR  Informed Consent: I have reviewed the patients History and Physical, chart, labs and discussed the procedure including the risks, benefits and alternatives for the proposed anesthesia with the patient or authorized representative who has indicated his/her understanding and acceptance.     Consent reviewed with POA  Plan Discussed with:   Anesthesia Plan Comments:       Anesthesia Quick Evaluation

## 2020-09-08 NOTE — Consult Note (Signed)
Subjective: 1. Severe sepsis ([name])   2. Encephalopathy acute      CC: sepsis.  Hx: [name] is a 74 yo male who I was asked to see by Dr. Tacy Learn in consultation for a right 68mm proximal stone with sepsis.  He was admitted on 12/11 and a CT was done on 12/12 that showed the stone.  He is improving on medical therapy with resolution of the fever and good VS.  The patient is unable to provide a history but doesn't admit to any pain.  The urine culture from 12/11 is negative and his UA just had mild pyuria.  The CT also shows small bilateral renal stones and bladder wall thickening consistent with BOO.   .   ROS:  Review of Systems  Unable to perform ROS: Mental acuity    Allergies  Allergen Reactions  . Hydrocodone   . Hydrocodone-Acetaminophen     unknown  . Aplisol [Tuberculin Ppd] Rash    Past Medical History:  Diagnosis Date  . Bell's palsy   . Bleeding ulcer    remote past  . Dementia    disoriented to time.does not know medical hx.information obtained from previous charts  . Hard of hearing   . Hyperlipemia   . Hypertension   . Obesity   . SOB (shortness of breath) on exertion     Past Surgical History:  Procedure Laterality Date  . BIOPSY  05/09/2019   Procedure: BIOPSY;  Surgeon: Daneil Dolin, MD;  Location: [facility];  Service: Endoscopy;;  . COLONOSCOPY  10/06/06   internal hemorrhoids and anal papilla/poor prep  . 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 ESOPHAGOGASTRODUODENOSCOPY N/A 05/01/2018   multiple 3-6 mm gastric nodules and gastritis, surrounding telangectasias. Biopsy with atrophic gastritis.   Marland Kitchen FOOT SURGERY    . KNEE ARTHROSCOPY  06/17/2011   Procedure: ARTHROSCOPY KNEE;  Surgeon: Sanjuana Kava;  Location: [facility];  Service: Orthopedics;  Laterality: Right;  . LAPAROSCOPIC PARTIAL COLECTOMY N/A 05/11/2019   Procedure: ATTEMPTED LAPAROSCOPIC PARTIAL COLECTOMY;  Surgeon: [name], MD;  Location: [facility];  Service:  General;  Laterality: N/A;  . PARTIAL COLECTOMY  05/11/2019   Procedure: PARTIAL COLECTOMY (Laparotomy incision at 0947;  Surgeon: [name], MD;  Location: [facility];  Service: General;;  . POLYPECTOMY  05/09/2019   Procedure: POLYPECTOMY;  Surgeon: Daneil Dolin, MD;  Location: [facility];  Service: Endoscopy;;    Social History   Socioeconomic History  . Marital status: Single    Spouse name: Not on file  . Number of children: Not on file  . Years of education: Not on file  . Highest education level: Not on file  Occupational History  . Not on file  Tobacco Use  . Smoking status: Former Smoker    Packs/day: 1.00    Years: 30.00    Pack years: 30.00    Types: Cigarettes  . Smokeless tobacco: Never Used  . Tobacco comment: quit a "long time ago"   Vaping Use  . Vaping Use: Never used  Substance and Sexual Activity  . Alcohol use: No  . Drug use: No  . Sexual activity: Never  Other Topics Concern  . Not on file  Social History Narrative  . Not on file   Social Determinants of Health   Financial Resource Strain: Not on file  Food Insecurity: Not on file  Transportation Needs: Not on file  Physical Activity: Not  on file  Stress: Not on file  Social Connections: Not on file  Intimate Partner Violence: Not on file    Family History  Problem Relation Age of Onset  . COPD Mother   . Cancer Brother   . Anesthesia problems Neg Hx   . Hypotension Neg Hx   . Malignant hyperthermia Neg Hx   . Pseudochol deficiency Neg Hx   . Colon cancer Neg Hx     Anti-infectives: Anti-infectives (From admission, onward)   Start     Dose/Rate Route Frequency Ordered Stop   09/07/20 1200  vancomycin (VANCOREADY) IVPB 1250 mg/250 mL  Status:  Discontinued       "Followed by" Linked Group Details   1,250 mg 166.7 mL/hr over 90 Minutes Intravenous Every 24 hours 09/06/20 1100 09/07/20 1044   09/07/20 1200  cefTRIAXone (ROCEPHIN) 2 g in sodium chloride 0.9 % 100 mL IVPB         2 g 200 mL/hr over 30 Minutes Intravenous Every 24 hours 09/07/20 1044     09/06/20 2200  ceFEPIme (MAXIPIME) 2 g in sodium chloride 0.9 % 100 mL IVPB  Status:  Discontinued        2 g 200 mL/hr over 30 Minutes Intravenous Every 12 hours 09/06/20 1100 09/07/20 1044   09/06/20 2130  acyclovir (ZOVIRAX) 850 mg in dextrose 5 % 150 mL IVPB  Status:  Discontinued        10 mg/kg  85.2 kg (Adjusted) 167 mL/hr over 60 Minutes Intravenous Every 8 hours 09/06/20 2042 09/07/20 1033   09/06/20 1200  vancomycin (VANCOREADY) IVPB 2000 mg/400 mL       "Followed by" Linked Group Details   2,000 mg 200 mL/hr over 120 Minutes Intravenous  Once 09/06/20 1100 09/06/20 1355   09/06/20 1100  ceFEPIme (MAXIPIME) 2 g in sodium chloride 0.9 % 100 mL IVPB        2 g 200 mL/hr over 30 Minutes Intravenous  Once 09/06/20 1047 09/06/20 1150   09/06/20 1100  metroNIDAZOLE (FLAGYL) IVPB 500 mg        500 mg 100 mL/hr over 60 Minutes Intravenous  Once 09/06/20 1047 09/06/20 1231   09/06/20 1100  vancomycin (VANCOCIN) IVPB 1000 mg/200 mL premix  Status:  Discontinued        1,000 mg 200 mL/hr over 60 Minutes Intravenous  Once 09/06/20 1047 09/06/20 1100      Current Facility-Administered Medications  Medication Dose Route Frequency Provider Last Rate Last Admin  . 0.9 %  sodium chloride infusion  250 mL Intravenous Continuous Zierle-Ghosh, Asia B, DO 10 mL/hr at 09/07/20 1150 250 mL at 09/07/20 1150  . acetaminophen (TYLENOL) tablet 650 mg  650 mg Oral Q6H PRN Zierle-Ghosh, Asia B, DO       Or  . acetaminophen (TYLENOL) suppository 650 mg  650 mg Rectal Q6H PRN Zierle-Ghosh, Asia B, DO      . atorvastatin (LIPITOR) tablet 5 mg  5 mg Oral QHS Zierle-Ghosh, Asia B, DO      . cefTRIAXone (ROCEPHIN) 2 g in sodium chloride 0.9 % 100 mL IVPB  2 g Intravenous Q24H Agarwala, Ravi, MD 200 mL/hr at 09/08/20 1206 2 g at 09/08/20 1206  . chlorhexidine (PERIDEX) 0.12 % solution 15 mL  15 mL Mouth Rinse BID Agarwala, Einar Grad, MD    15 mL at 09/08/20 1022  . Chlorhexidine Gluconate Cloth 2 % PADS 6 each  6 each Topical Daily Collier Bullock, MD   6  each at 09/08/20 1022  . ferrous sulfate tablet 325 mg  325 mg Oral Q1200 Zierle-Ghosh, Asia B, DO      . influenza vaccine adjuvanted (FLUAD) injection 0.5 mL  0.5 mL Intramuscular Tomorrow-1000 Collier Bullock, MD      . insulin aspart (novoLOG) injection 0-15 Units  0-15 Units Subcutaneous TID WC Zierle-Ghosh, Asia B, DO      . insulin aspart (novoLOG) injection 0-5 Units  0-5 Units Subcutaneous QHS Zierle-Ghosh, Asia B, DO      . lactated ringers bolus 1,000 mL  1,000 mL Intravenous Once Jacky Kindle, MD      . MEDLINE mouth rinse  15 mL Mouth Rinse q12n4p Kipp Brood, MD   15 mL at 09/08/20 1207  . multivitamin with minerals tablet 1 tablet  1 tablet Oral Daily Zierle-Ghosh, Asia B, DO      . norepinephrine (LEVOPHED) 4mg  in 255mL premix infusion  2-10 mcg/min Intravenous Titrated Zierle-Ghosh, Asia B, DO   Stopped at 09/08/20 0241  . ondansetron (ZOFRAN) tablet 4 mg  4 mg Oral Q6H PRN Zierle-Ghosh, Asia B, DO       Or  . ondansetron (ZOFRAN) injection 4 mg  4 mg Intravenous Q6H PRN Zierle-Ghosh, Asia B, DO      . oxyCODONE (Oxy IR/ROXICODONE) immediate release tablet 5 mg  5 mg Oral Q4H PRN Zierle-Ghosh, Asia B, DO         Objective: Vital signs in last 24 hours: BP 122/70   Pulse 86   Temp 98.7 F (37.1 C) (Oral)   Resp (!) 24   Ht 5\' 9"  (1.753 m)   Wt 107 kg   SpO2 98%   BMI 34.84 kg/m   Intake/Output from previous day: 12/12 0701 - 12/13 0700 In: 475.2 [I.V.:335.9; IV Piggyback:139.3] Out: 800 [Urine:800] Intake/Output this shift: Total I/O In: 5.2 [I.V.:5.2] Out: -    Physical Exam Vitals reviewed.  Constitutional:      Appearance: He is obese.  Cardiovascular:     Rate and Rhythm: Normal rate and regular rhythm.  Pulmonary:     Effort: Pulmonary effort is normal. No respiratory distress.  Abdominal:     Palpations: Abdomen is soft.      Tenderness: There is no abdominal tenderness.  Neurological:     Mental Status: He is alert.     Lab Results:  Results for orders placed or performed during the hospital encounter of 09/06/20 (from the past 24 hour(s))  Glucose, capillary     Status: None   Collection Time: 09/07/20  3:49 PM  Result Value Ref Range   Glucose-Capillary 95 70 - 99 mg/dL  Glucose, capillary     Status: None   Collection Time: 09/07/20 10:51 PM  Result Value Ref Range   Glucose-Capillary 89 70 - 99 mg/dL  Comprehensive metabolic panel     Status: Abnormal   Collection Time: 09/08/20  1:18 AM  Result Value Ref Range   Sodium 141 135 - 145 mmol/L   Potassium 4.3 3.5 - 5.1 mmol/L   Chloride 102 98 - 111 mmol/L   CO2 23 22 - 32 mmol/L   Glucose, Bld 104 (H) 70 - 99 mg/dL   BUN 34 (H) 8 - 23 mg/dL   Creatinine, Ser 1.84 (H) 0.61 - 1.24 mg/dL   Calcium 8.0 (L) 8.9 - 10.3 mg/dL   Total Protein 5.7 (L) 6.5 - 8.1 g/dL   Albumin 2.4 (L) 3.5 - 5.0 g/dL   AST 36 15 -  41 U/L   ALT 22 0 - 44 U/L   Alkaline Phosphatase 67 38 - 126 U/L   Total Bilirubin 1.0 0.3 - 1.2 mg/dL   GFR, Estimated 38 (L) >60 mL/min   Anion gap 16 (H) 5 - 15  CBC     Status: Abnormal   Collection Time: 09/08/20  1:18 AM  Result Value Ref Range   WBC 17.7 (H) 4.0 - 10.5 K/uL   RBC 3.97 (L) 4.22 - 5.81 MIL/uL   Hemoglobin 11.8 (L) 13.0 - 17.0 g/dL   HCT 37.3 (L) 39.0 - 52.0 %   MCV 94.0 80.0 - 100.0 fL   MCH 29.7 26.0 - 34.0 pg   MCHC 31.6 30.0 - 36.0 g/dL   RDW 14.0 11.5 - 15.5 %   Platelets 137 (L) 150 - 400 K/uL   nRBC 0.0 0.0 - 0.2 %  Glucose, capillary     Status: None   Collection Time: 09/08/20  6:52 AM  Result Value Ref Range   Glucose-Capillary 82 70 - 99 mg/dL  Glucose, capillary     Status: None   Collection Time: 09/08/20 11:44 AM  Result Value Ref Range   Glucose-Capillary 84 70 - 99 mg/dL    BMET Recent Labs    09/07/20 0830 09/08/20 0118  NA 140 141  K 4.1 4.3  CL 101 102  CO2 18* 23  GLUCOSE 139*  104*  BUN 26* 34*  CREATININE 1.85* 1.84*  CALCIUM 8.3* 8.0*   PT/INR Recent Labs    09/06/20 1014  LABPROT 14.3  INR 1.2   ABG Recent Labs    09/06/20 1110  HCO3 21.7    Studies/Results: MR BRAIN WO CONTRAST  Result Date: 09/07/2020 CLINICAL DATA:  Mental status change, unknown cause EXAM: MRI HEAD WITHOUT CONTRAST TECHNIQUE: Multiplanar, multiecho pulse sequences of the brain and surrounding structures were obtained without intravenous contrast. COMPARISON:  09/06/2020 and prior FINDINGS: Examination was terminated early secondary to patient disposition. Please note that limited sequence acquisition and motion artifact limits evaluation. Brain: No midline shift, mass lesion or ventriculomegaly. No extra-axial fluid collection. No intracranial hemorrhage. Small foci of diffusion-weighted hyperintensity with mild ADC correlate involving the left pons and medulla. Vascular: Not well evaluated. Skull and upper cervical spine: No focal lesions on limited sequence. Sinuses/Orbits: Not well evaluated. Other: None. IMPRESSION: Small foci of restricted diffusion involving the left pons and medulla may reflect acute/subacute insults versus artifact. Limited examination secondary to early termination and motion artifact. Electronically Signed   By: Primitivo Gauze M.D.   On: 09/07/2020 06:28   ECHOCARDIOGRAM COMPLETE  Result Date: 09/07/2020    ECHOCARDIOGRAM REPORT   Patient Name:   LOCHLANN MASTRANGELO Date of Exam: 09/07/2020 Medical Rec #:  [id number]   Height:       69.0 in Accession #:    [id number]  Weight:       235.9 lb Date of Birth:  Jun 24, 1946   BSA:          2.216 m Patient Age:    22 years    BP:           109/48 mmHg Patient Gender: M           HR:           103 bpm. Exam Location:  Inpatient Procedure: Limited Echo Indications:    Acute respiratory failure  History:        Patient has prior history of Echocardiogram examinations, most  recent 05/09/2019.  Signs/Symptoms:Altered Mental Status; Risk                 Factors:Hypertension and Dyslipidemia. Dementia.  Sonographer:    Dustin Flock Referring Phys: [id number] [place]  Sonographer Comments: Technically challenging study due to limited acoustic windows. Image acquisition challenging due to patient behavioral factors. and Image acquisition challenging due to uncooperative patient. IMPRESSIONS  1. Limited windows for image acquisition and forshortened images limit accurate assessment of LV function but grossly appears normal.  2. Left ventricular ejection fraction, by estimation, is 55 to 60%. The left ventricle has normal function. Left ventricular endocardial border not optimally defined to evaluate regional wall motion. Left ventricular diastolic function could not be evaluated.  3. Right ventricular systolic function was not well visualized. The right ventricular size is not well visualized. Tricuspid regurgitation signal is inadequate for assessing PA pressure.  4. The mitral valve is normal in structure. No evidence of mitral valve regurgitation. No evidence of mitral stenosis.  5. The aortic valve is normal in structure. Aortic valve regurgitation is not visualized. No aortic stenosis is present.  6. The inferior vena cava is normal in size with greater than 50% respiratory variability, suggesting right atrial pressure of 3 mmHg. FINDINGS  Left Ventricle: Left ventricular ejection fraction, by estimation, is 55 to 60%. The left ventricle has normal function. Left ventricular endocardial border not optimally defined to evaluate regional wall motion. The left ventricular internal cavity size was normal in size. There is no left ventricular hypertrophy. Left ventricular diastolic function could not be evaluated. Right Ventricle: The right ventricular size is not well visualized. Right vetricular wall thickness was not assessed. Right ventricular systolic function was not well visualized. Tricuspid  regurgitation signal is inadequate for assessing PA pressure. Left Atrium: Left atrial size was normal in size. Right Atrium: Right atrial size was not well visualized. Pericardium: There is no evidence of pericardial effusion. Mitral Valve: The mitral valve is normal in structure. No evidence of mitral valve regurgitation. No evidence of mitral valve stenosis. Tricuspid Valve: The tricuspid valve is normal in structure. Tricuspid valve regurgitation is not demonstrated. No evidence of tricuspid stenosis. Aortic Valve: The aortic valve is normal in structure. Aortic valve regurgitation is not visualized. No aortic stenosis is present. Pulmonic Valve: The pulmonic valve was normal in structure. Pulmonic valve regurgitation is not visualized. No evidence of pulmonic stenosis. Aorta: The aortic root is normal in size and structure and aortic root could not be assessed. Venous: The inferior vena cava was not well visualized. The inferior vena cava is normal in size with greater than 50% respiratory variability, suggesting right atrial pressure of 3 mmHg. IAS/Shunts: No atrial level shunt detected by color flow Doppler.  LEFT VENTRICLE PLAX 2D LVIDd:         5.20 cm LVIDs:         3.90 cm LV PW:         1.10 cm LV IVS:        1.10 cm LVOT diam:     2.60 cm LVOT Area:     5.31 cm  LEFT ATRIUM         Index LA diam:    3.50 cm 1.58 cm/m   AORTA Ao Root diam: 3.30 cm  SHUNTS Systemic Diam: 2.60 cm Fransico Him MD Electronically signed by Fransico Him MD Signature Date/Time: 09/07/2020/3:26:13 PM    Final    CT ANGIO ABDOMEN PELVIS  W &/OR WO CONTRAST  Result Date: 09/07/2020 CLINICAL DATA:  Mesenteric ischemia EXAM: CTA ABDOMEN AND PELVIS WITHOUT AND WITH CONTRAST TECHNIQUE: Multidetector CT imaging of the abdomen and pelvis was performed using the standard protocol during bolus administration of intravenous contrast. Multiplanar reconstructed images and MIPs were obtained and reviewed to evaluate the vascular  anatomy. CONTRAST:  55mL OMNIPAQUE IOHEXOL 350 MG/ML SOLN COMPARISON:  12/25/2018 FINDINGS: VASCULAR Scattered aortic atherosclerosis. The aorta is normal in contour and caliber with a standard branching pattern and solitary bilateral renal arteries. The branch vessels, including the superior mesenteric artery, are widely patent from their origins. Review of the MIP images confirms the above findings. NON-VASCULAR Lower chest: Trace bilateral pleural effusions and associated atelectasis or consolidation. Hepatobiliary: No solid liver abnormality is seen. No gallstones, gallbladder wall thickening, or biliary dilatation. Pancreas: Unremarkable. No pancreatic ductal dilatation or surrounding inflammatory changes. Spleen: Normal in size without significant abnormality. Adrenals/Urinary Tract: Adrenal glands are unremarkable. Multiple small bilateral renal calculi. There is a 7 mm calculus in the proximal third of right ureter with mild associated hydronephrosis (series 10, image 132). Fat stranding about the right kidney and the adjacent transverse portion of the duodenum (series 10, image 120). Wall thickening and multiple small diverticula of the urinary bladder. Stomach/Bowel: Stomach is within normal limits. Postoperative findings of the right hemicolon, likely terminal ileocolectomy. No evidence of bowel wall thickening, distention, or inflammatory changes. Lymphatic: No enlarged abdominal or pelvic lymph nodes. Reproductive: No mass or other significant abnormality. Other: No abdominal wall hernia or abnormality. No abdominopelvic ascites. Musculoskeletal: No acute or significant osseous findings. IMPRESSION: 1. The aorta is normal in contour and caliber with a standard branching pattern and solitary bilateral renal arteries. Scattered aortic atherosclerosis. The branch vessels, including the superior mesenteric artery, are widely patent from their origins. No evidence of mesenteric ischemia. 2. There is a 7 mm  calculus in the proximal third of right ureter with mild associated hydronephrosis. Reactive fat stranding about the right kidney and the adjacent transverse portion of the duodenum. 3. Multiple additional small bilateral renal calculi. 4. Wall thickening and multiple small diverticula of the urinary bladder, suggestive of chronic outlet obstruction. 5. Trace bilateral pleural effusions and associated atelectasis or consolidation. Aortic Atherosclerosis (ICD10-I70.0). Electronically Signed   By: Eddie Candle M.D.   On: 09/07/2020 13:11   I have discussed the case with Dr. Tacy Learn and reviewed the labs and imaging.   Assessment/Plan: Right proximal ureteral stone with sepsis.   I will get him set up for cystoscopy with right ureteral stenting this afternoon.   I have left a message with his guardian as the patient is mentally disabled and doesn't exhibit understanding of what needs to be done.         No follow-ups on file.    CC: Dr. Jacky Kindle.     Irine Seal 09/08/2020 [phone]

## 2020-09-08 NOTE — OR Nursing (Addendum)
Pt is awake,alert and oriented.Pt and/or family verbalized understanding of poc and discharge instructions. Reviewed admission and on going care with receiving RN. Pt is in NAD at this time and is ready to be transferred to floor. Will con't to monitor until pt is transferred.  

## 2020-09-08 NOTE — Plan of Care (Signed)
  Problem: Clinical Measurements: Goal: Ability to maintain clinical measurements within normal limits will improve Outcome: Progressing Goal: Diagnostic test results will improve Outcome: Progressing Goal: Respiratory complications will improve Outcome: Progressing Goal: Cardiovascular complication will be avoided Outcome: Progressing   Problem: Activity: Goal: Risk for activity intolerance will decrease Outcome: Progressing   Problem: Coping: Goal: Level of anxiety will decrease Outcome: Progressing   Problem: Elimination: Goal: Will not experience complications related to urinary retention Outcome: Progressing   Problem: Pain Managment: Goal: General experience of comfort will improve Outcome: Progressing   Problem: Safety: Goal: Ability to remain free from injury will improve Outcome: Progressing   Problem: Skin Integrity: Goal: Risk for impaired skin integrity will decrease Outcome: Progressing   Problem: Education: Goal: Knowledge of General Education information will improve Description: Including pain rating scale, medication(s)/side effects and non-pharmacologic comfort measures Outcome: Not Progressing   Problem: Health Behavior/Discharge Planning: Goal: Ability to manage health-related needs will improve Outcome: Not Progressing   Problem: Clinical Measurements: Goal: Will remain free from infection Outcome: Not Progressing   Problem: Nutrition: Goal: Adequate nutrition will be maintained Outcome: Not Progressing   Problem: Elimination: Goal: Will not experience complications related to bowel motility Outcome: Not Progressing

## 2020-09-08 NOTE — Anesthesia Procedure Notes (Addendum)
Procedure Name: LMA Insertion Date/Time: 09/08/2020 5:43 PM Performed by: Darletta Moll, CRNA Pre-anesthesia Checklist: Patient identified, Emergency Drugs available, Suction available and Patient being monitored Patient Re-evaluated:Patient Re-evaluated prior to induction Oxygen Delivery Method: Circle system utilized Preoxygenation: Pre-oxygenation with 100% oxygen Induction Type: IV induction LMA: LMA inserted LMA Size: 5.0 Number of attempts: 1 Placement Confirmation: positive ETCO2,  breath sounds checked- equal and bilateral and CO2 detector Tube secured with: Tape Dental Injury: Teeth and Oropharynx as per pre-operative assessment

## 2020-09-08 NOTE — Progress Notes (Signed)
Initial Nutrition Assessment  DOCUMENTATION CODES:   Not applicable  INTERVENTION:   Once diet advanced:   Ensure Enlive po BID, each supplement provides 350 kcal and 20 grams of protein  MVI daily   NUTRITION DIAGNOSIS:   Increased nutrient needs related to wound healing as evidenced by estimated needs.  GOAL:   Patient will meet greater than or equal to 90% of their needs  MONITOR:   Supplement acceptance,Weight trends,Diet advancement,PO intake,Labs,I & O's,Skin  REASON FOR ASSESSMENT:   Malnutrition Screening Tool    ASSESSMENT:   Patient with PMH significant for dementia, HTN, HLD, colon cancer s/p resection, and GIB. Presents this admission with sepsis 2/2 pyelonephritis and R ureteral stone.   Plan cystoscopy with possible stent.   Pt unable to provide history. No family/visitors at bedside. Currently NPO for procedure. RD to provide supplementation to maximize kcal and protein this admission.   Records indicate pt weighed 112.3 kg on 12/27/2019 and 107 this admission (4.7% wt loss in 8 months, insignificant for time frame).   UOP: 800 ml x 24 hrs   Medications: SS novolog Labs: CBG 82-95  NUTRITION - FOCUSED PHYSICAL EXAM:  Flowsheet Row Most Recent Value  Orbital Region No depletion  Upper Arm Region No depletion  Thoracic and Lumbar Region Unable to assess  Buccal Region No depletion  Temple Region Mild depletion  Clavicle Bone Region Mild depletion  Clavicle and Acromion Bone Region Mild depletion  Scapular Bone Region No depletion  Dorsal Hand No depletion  Patellar Region No depletion  Anterior Thigh Region No depletion  Posterior Calf Region Unable to assess  Edema (RD Assessment) Unable to assess  Hair Reviewed  Eyes Reviewed  Mouth Reviewed  Skin Reviewed  Nails Reviewed     Diet Order:   Diet Order            Diet NPO time specified  Diet effective now                 EDUCATION NEEDS:   Not appropriate for education at  this time  Skin:  Skin Assessment: Skin Integrity Issues: Skin Integrity Issues:: Stage II Stage II: buttocks  Last BM:  12/12  Height:   Ht Readings from Last 1 Encounters:  09/07/20 5\' 9"  (1.753 m)    Weight:   Wt Readings from Last 1 Encounters:  09/07/20 107 kg    BMI:  Body mass index is 34.84 kg/m.  Estimated Nutritional Needs:   Kcal:  2300-2500 kcal  Protein:  115-130 grams  Fluid:  >/= 2 L/day  Mariana Single RD, LDN Clinical Nutrition Pager listed in [place]

## 2020-09-08 NOTE — Progress Notes (Signed)
[facility] Progress Note Patient Name: [name] DOB: 1946/03/25 MRN: [id number]   Date of Service  09/08/2020  HPI/Events of Note  Hypotension - BP not improved with 25% Albumin IV. Norepinephrine IV infusion restarted by nursing to support BP.  eICU Interventions  Plan: 1. D/C Transfer orders.      Intervention Category Major Interventions: Hypotension - evaluation and management  Anmol Fleck [place] 09/08/2020, 3:28 AM

## 2020-09-08 NOTE — Op Note (Signed)
Procedure: 1.  Cystoscopy with right retrograde pyelogram and interpretation. 2.  Cystoscopy with right ureteral stent insertion. 3.  Application of fluoroscopy.  Preop diagnosis: Right proximal stone with sepsis.  Postop diagnosis: Same.  Surgeon: Dr. Irine Seal.  Anesthesia: General.  Specimen: None.  Drains: 6 French by 24 cm right contour double-J stent and 16 French Foley catheter.  EBL: None.  Complications: None.  Indications: The patient is a 74 year old male with a right proximal ureteral stone with sepsis.  He is to undergo stent placement.  Procedure: He had been on preoperative Rocephin.  A general anesthetic was induced.  He was placed in lithotomy position and fitted with PAS hose.  His perineum and genitalia were prepped with Betadine solution he was draped in usual sterile fashion.  Cystoscopy was performed using a 23 [place] scope and 30 degree lens.  Examination revealed a normal urethra.  The external sphincter was intact.  Prostatic urethra short with minimal hyperplasia but a somewhat high bladder neck.  Examination of bladder revealed turbid urine that required aspiration in order to visualize the bladder wall.  No significant mucosal lesions were identified.  He had severe trabeculation with multiple cellules.  Ureteral orifices were unremarkable.  The right ureteral orifice was cannulated with a 5 [place] open-ended catheter and contrast was instilled.  The retrograde pyelogram revealed a somewhat tortuous right ureter with some narrowed areas that appeared physiologic.  In the mid proximal ureter there was a filling defect consistent with the stone.  There was mild proximal hydronephrosis.  After completion of retrograde pyelogram a sensor wire was advanced to the kidney through the open-ended catheter and the open-ended catheter was removed.  A 6 French by 24 cm contour double-J stent was then advanced over the wire to the kidney under fluoroscopic guidance.  The  wire was removed, leaving a good coil in the kidney and a good coil in the bladder.  Purulent urine was noted to effluxed from the stent ports.  The cystoscope was removed and a 16 French Foley catheter was inserted.  The balloon was filled with 10 mL of sterile fluid.  Catheter was placed to straight drainage.  Patient was taken down from lithotomy position.  The anesthetic was reversed and he was moved recovery in stable condition.  There were no complications.

## 2020-09-08 NOTE — Transfer of Care (Signed)
Immediate Anesthesia Transfer of Care Note  Patient: DHANVIN SZETO  Procedure(s) Performed: CYSTOSCOPY WITH RETROGRADE PYELOGRAM/URETERAL STENT PLACEMENT (Right Ureter)  Patient Location: PACU  Anesthesia Type:General  Level of Consciousness: drowsy and patient cooperative  Airway & Oxygen Therapy: Patient Spontanous Breathing  Post-op Assessment: Report given to RN, Post -op Vital signs reviewed and stable and Patient moving all extremities X 4  Post vital signs: Reviewed and stable  Last Vitals:  Vitals Value Taken Time  BP 126/63 09/08/20 1810  Temp    Pulse 85 09/08/20 1812  Resp 19 09/08/20 1812  SpO2 97 % 09/08/20 1812  Vitals shown include unvalidated device data.  Last Pain:  Vitals:   09/08/20 1542  TempSrc: Oral  PainSc:          Complications: No complications documented.

## 2020-09-09 ENCOUNTER — Encounter (HOSPITAL_COMMUNITY): Payer: Self-pay | Admitting: Urology

## 2020-09-09 DIAGNOSIS — G9341 Metabolic encephalopathy: Secondary | ICD-10-CM

## 2020-09-09 DIAGNOSIS — F0390 Unspecified dementia without behavioral disturbance: Secondary | ICD-10-CM

## 2020-09-09 DIAGNOSIS — R7881 Bacteremia: Secondary | ICD-10-CM

## 2020-09-09 DIAGNOSIS — I10 Essential (primary) hypertension: Secondary | ICD-10-CM

## 2020-09-09 DIAGNOSIS — Z7189 Other specified counseling: Secondary | ICD-10-CM

## 2020-09-09 DIAGNOSIS — E876 Hypokalemia: Secondary | ICD-10-CM

## 2020-09-09 DIAGNOSIS — E872 Acidosis: Secondary | ICD-10-CM

## 2020-09-09 DIAGNOSIS — R532 Functional quadriplegia: Secondary | ICD-10-CM

## 2020-09-09 DIAGNOSIS — R5381 Other malaise: Secondary | ICD-10-CM

## 2020-09-09 DIAGNOSIS — N179 Acute kidney failure, unspecified: Secondary | ICD-10-CM

## 2020-09-09 DIAGNOSIS — B961 Klebsiella pneumoniae [K. pneumoniae] as the cause of diseases classified elsewhere: Secondary | ICD-10-CM

## 2020-09-09 DIAGNOSIS — R6521 Severe sepsis with septic shock: Secondary | ICD-10-CM

## 2020-09-09 LAB — BASIC METABOLIC PANEL
Anion gap: 14 (ref 5–15)
BUN: 37 mg/dL — ABNORMAL HIGH (ref 8–23)
CO2: 24 mmol/L (ref 22–32)
Calcium: 8.1 mg/dL — ABNORMAL LOW (ref 8.9–10.3)
Chloride: 106 mmol/L (ref 98–111)
Creatinine, Ser: 1.53 mg/dL — ABNORMAL HIGH (ref 0.61–1.24)
GFR, Estimated: 47 mL/min — ABNORMAL LOW (ref >60)
Glucose, Bld: 99 mg/dL (ref 70–99)
Potassium: 3.4 mmol/L — ABNORMAL LOW (ref 3.5–5.1)
Sodium: 144 mmol/L (ref 135–145)

## 2020-09-09 LAB — GLUCOSE, CAPILLARY
Glucose-Capillary: 100 mg/dL — ABNORMAL HIGH (ref 70–99)
Glucose-Capillary: 130 mg/dL — ABNORMAL HIGH (ref 70–99)
Glucose-Capillary: 138 mg/dL — ABNORMAL HIGH (ref 70–99)
Glucose-Capillary: 88 mg/dL (ref 70–99)
Glucose-Capillary: 95 mg/dL (ref 70–99)
Glucose-Capillary: 98 mg/dL (ref 70–99)

## 2020-09-09 LAB — PHOSPHORUS: Phosphorus: 2.3 mg/dL — ABNORMAL LOW (ref 2.5–4.6)

## 2020-09-09 LAB — CULTURE, BLOOD (ROUTINE X 2)
Special Requests: ADEQUATE
Special Requests: ADEQUATE

## 2020-09-09 LAB — MAGNESIUM: Magnesium: 1.7 mg/dL (ref 1.7–2.4)

## 2020-09-09 MED ORDER — POTASSIUM PHOSPHATES 15 MMOLE/5ML IV SOLN
20.0000 mmol | Freq: Once | INTRAVENOUS | Status: AC
  Administered 2020-09-09: 15:00:00 20 mmol via INTRAVENOUS
  Filled 2020-09-09: qty 6.67

## 2020-09-09 MED ORDER — POTASSIUM CHLORIDE 10 MEQ/100ML IV SOLN
10.0000 meq | INTRAVENOUS | Status: AC
  Administered 2020-09-09 (×4): 10 meq via INTRAVENOUS
  Filled 2020-09-09 (×4): qty 100

## 2020-09-09 MED ORDER — MAGNESIUM SULFATE 2 GM/50ML IV SOLN
2.0000 g | Freq: Once | INTRAVENOUS | Status: AC
  Administered 2020-09-09: 16:00:00 2 g via INTRAVENOUS
  Filled 2020-09-09: qty 50

## 2020-09-09 MED ORDER — DEXTROSE 10 % IV SOLN: INTRAVENOUS | Status: DC

## 2020-09-09 MED ORDER — CEFAZOLIN SODIUM-DEXTROSE 2-4 GM/100ML-% IV SOLN
2.0000 g | Freq: Three times a day (TID) | INTRAVENOUS | Status: DC
  Administered 2020-09-09 – 2020-09-10 (×3): 2 g via INTRAVENOUS
  Filled 2020-09-09 (×5): qty 100

## 2020-09-09 MED ORDER — KCL-LACTATED RINGERS-D5W 20 MEQ/L IV SOLN
INTRAVENOUS | Status: DC
  Filled 2020-09-09 (×2): qty 1000

## 2020-09-09 MED ORDER — SODIUM CHLORIDE 0.9% FLUSH: 10.0000 mL | INTRAVENOUS | Status: DC | PRN

## 2020-09-09 NOTE — Progress Notes (Signed)
PROGRESS NOTE  [name] WNI:[id number] DOB: [birth year]   PCP: Caprice Renshaw, MD  Patient is from: Nursing home  DOA: 09/06/2020 LOS: 3  Chief complaints: Altered mental status  Brief Narrative / Interim history: 74 year old male with history of dementia, Bell's palsy, stage III colon cancer, GIB, HTN, HLD and debility presented to [facility], ED with altered mental status.  He was admitted to ICU for septic shock due to Klebsiella pneumonia bacteremia, pyelonephritis with obstructive uropathy, and transferred to [facility] for further care.  Blood culture grew Klebsiella pneumonia.  Urine culture with insignificant growth x2.  CSF culture NGTD.  Patient underwent cystoscopy with right ureteral stent placement on 09/08/2020.  Antibiotic escalated to IV ceftriaxone.  Eventually stabilized and transferred to [facility] on 09/09/2020.  Infectious disease consulted and following.  There was also concern about a stroke on presentation, and neurology was consulted in ED and recommended MRI with and without contrast to exclude metastasis to brain.  MRI brain without contrast reported as small foci of restricted diffusion involving the left pons and medulla which could be acute/subacute insults or artifacts.    Subjective: Seen and examined earlier this morning.  No major events overnight of this morning.  Patient was a sleepy and wakes to voice.  He is only oriented to self.  Follows some commands but not able to provide further history.  He does not appear to be distressed.  Unclear what his baseline is.   Objective: Vitals:   09/08/20 2000 09/08/20 2020 09/09/20 0233 09/09/20 0551  BP: 130/69 125/63 (!) 115/58 123/63  Pulse: 87 84 84 70  Resp: '20 17 17 15  ' Temp:  98.6 F (37 C) (!) 97.5 F (36.4 C) 98 F (36.7 C)  TempSrc:  Axillary Axillary Axillary  SpO2: 95% 96% 91% 92%  Weight:      Height:        Intake/Output Summary (Last 24 hours) at 09/09/2020 1224 Last data filed at 09/09/2020  0325 Gross per 24 hour  Intake 1074.8 ml  Output 853 ml  Net 221.8 ml   Filed Weights   09/06/20 1029 09/07/20 0800 09/08/20 1647  Weight: 107 kg 107 kg 107 kg    Examination:  GENERAL: No apparent distress.  Nontoxic. HEENT: MMM.  Vision and hearing grossly intact.  NECK: Supple.  No apparent JVD.  RESP:  No IWOB.  Fair aeration bilaterally. CVS:  RRR. Heart sounds normal.  ABD/GI/GU: BS+. Abd soft, NTND.  Indwelling Foley catheter in place. MSK/EXT: Some chronic deformity in both legs and arms.  Trace to 1+ edema in BLE SKIN: no apparent skin lesion or wound NEURO: Sleepy but wakes to voice.  Oriented to self only.  Follows some commands.  No apparent focal neuro deficit but barely moves his hands and his legs.  PSYCH: Calm.  No distress or agitation.  Procedures:  08/27/2020-lumbar puncture 09/08/2020-cystoscopy with right ureteral stent placement by Dr. Jeffie Pollock  Microbiology summarized: 12/12-flu A and B and COVID-19 PCR nonreactive 12/12-MRSA PCR nonreactive 12/11-blood cultures with pansensitive Klebsiella pneumonia except to ampicillin 12/13-blood culture pending.  Assessment & Plan: Severe sepsis with shock due to Klebsiella pneumonia bacteremia and pyelonephritis with obstructive uropathy: POA.  Met criteria on admission with fever, tachycardia, tachypnea, hypotension, lactic acidosis, AKI and encephalopathy.  Required IV pressors.  Culture data as above.  TTE without significant finding.  Sepsis physiology resolving. -Cefepime and vancomycin 12/11-12/12>> ceftriaxone 12/14>> IV Ancef>> -S/p cystoscopy with right ureteral stent placement on 12/13 -Infectious  disease consulted.  Right ureteral stone with mild to moderate hydronephrosis -S/p cystoscopy and ureteral stent. -Antibiotics as above  Acute toxic-metabolic encephalopathy in patient with severe dementia: Likely due to sepsis as above.  Only oriented to self at this time -Reorientation and delirium  precautions -Treat treatable causes -N.p.o. and IVF pending SLP eval.  AKI likely combination of prerenal from dehydration and ATN from sepsis: Improving. Recent Labs    12/11/19 1241 03/25/20 1417 09/06/20 1014 09/06/20 1048 09/07/20 0830 09/08/20 0118 09/09/20 0135  BUN '12 13 20 19 ' 26* 34* 37*  CREATININE 0.49* 0.56* 1.53* 1.30* 1.85* 1.84* 1.53*  -Treat sepsis as above -Avoid nephrotoxic meds -Continue monitoring  High anion gap metabolic acidosis: Likely due to lactic acidosis.  Resolved.  Debility/physical deconditioning/functional quadriplegia -PT/OT eval.  History of colon cancer-stage III -Outpatient follow-up  Essential hypertension: Normotensive -Continue holding atenolol and Lasix.  Controlled NIDDM-2: A1c 5.8%.  Appears she is on Metformin at home. Recent Labs  Lab 09/08/20 2340 09/09/20 0017 09/09/20 0352 09/09/20 0738 09/09/20 1116  GLUCAP 73 95 88 98 100*  -Change D10-D5-LR while NPO  History of GI bleed: Slight drop in Hgb likely transition from hemoconcentration to hemodilution -Continue monitoring  Hypokalemia/hypomagnesemia/hypophosphatemia: K3.4.  Mg 1.7.  P2.3. -Replenish and recheck  Thrombocytopenia: plt 214>>> 137. -Continue monitoring.  Leukocytosis with left shift: Likely due to #1. -Continue monitoring  Goal of care: Patient with significant comorbidities as above.  Still full code which I believe would pose more harm than benefit.  I was not able to reach legal guardian over the phone to discuss this. -Consult palliative care  Nutrition Body mass index is 34.82 kg/m. Nutrition Problem: Increased nutrient needs Etiology: wound healing Signs/Symptoms: estimated needs Interventions: Ensure Enlive (each supplement provides 350kcal and 20 grams of protein),MVI Pressure Injury 09/07/20 Buttocks Mid Stage 2 -  Partial thickness loss of dermis presenting as a shallow open injury with a red, pink wound bed without slough. (Active)   09/07/20 0530  Location: Buttocks  Location Orientation: Mid  Staging: Stage 2 -  Partial thickness loss of dermis presenting as a shallow open injury with a red, pink wound bed without slough.  Wound Description (Comments):   Present on Admission: Yes   DVT prophylaxis:  SCDs Start: 09/07/20 0549 SCDs Start: 09/06/20 2333  Code Status: Full code Family Communication: Patient and/or RN.  Attempted to call CRU was listed as legal guardian but no answer. Status is: Inpatient  Remains inpatient appropriate because:Persistent severe electrolyte disturbances, Altered mental status, Unsafe d/c plan, IV treatments appropriate due to intensity of illness or inability to take PO and Inpatient level of care appropriate due to severity of illness   Dispo: The patient is from: SNF              Anticipated d/c is to: SNF              Anticipated d/c date is: 3 days              Patient currently is not medically stable to d/c.       Consultants:  PCCM Urology Infectious disease Palliative medicine   Sch Meds:  Scheduled Meds:  atorvastatin  5 mg Oral QHS   chlorhexidine  15 mL Mouth Rinse BID   Chlorhexidine Gluconate Cloth  6 each Topical Daily   feeding supplement  237 mL Oral BID BM   ferrous sulfate  325 mg Oral Q1200   influenza vaccine adjuvanted  0.5  mL Intramuscular Tomorrow-1000   insulin aspart  0-15 Units Subcutaneous TID WC   insulin aspart  0-5 Units Subcutaneous QHS   mouth rinse  15 mL Mouth Rinse q12n4p   multivitamin with minerals  1 tablet Oral Daily   Continuous Infusions:  sodium chloride 250 mL (09/07/20 1150)    ceFAZolin (ANCEF) IV     dextrose 20 mL/hr at 09/09/20 0147   potassium chloride 10 mEq (09/09/20 1220)   PRN Meds:.acetaminophen **OR** acetaminophen, ondansetron **OR** ondansetron (ZOFRAN) IV, oxyCODONE, sodium chloride flush  Antimicrobials: Anti-infectives (From admission, onward)   Start     Dose/Rate Route Frequency  Ordered Stop   09/09/20 1400  ceFAZolin (ANCEF) IVPB 2g/100 mL premix        2 g 200 mL/hr over 30 Minutes Intravenous Every 8 hours 09/09/20 1125     09/07/20 1200  vancomycin (VANCOREADY) IVPB 1250 mg/250 mL  Status:  Discontinued       "Followed by" Linked Group Details   1,250 mg 166.7 mL/hr over 90 Minutes Intravenous Every 24 hours 09/06/20 1100 09/07/20 1044   09/07/20 1200  cefTRIAXone (ROCEPHIN) 2 g in sodium chloride 0.9 % 100 mL IVPB  Status:  Discontinued        2 g 200 mL/hr over 30 Minutes Intravenous Every 24 hours 09/07/20 1044 09/09/20 1125   09/06/20 2200  ceFEPIme (MAXIPIME) 2 g in sodium chloride 0.9 % 100 mL IVPB  Status:  Discontinued        2 g 200 mL/hr over 30 Minutes Intravenous Every 12 hours 09/06/20 1100 09/07/20 1044   09/06/20 2130  acyclovir (ZOVIRAX) 850 mg in dextrose 5 % 150 mL IVPB  Status:  Discontinued        10 mg/kg  85.2 kg (Adjusted) 167 mL/hr over 60 Minutes Intravenous Every 8 hours 09/06/20 2042 09/07/20 1033   09/06/20 1200  vancomycin (VANCOREADY) IVPB 2000 mg/400 mL       "Followed by" Linked Group Details   2,000 mg 200 mL/hr over 120 Minutes Intravenous  Once 09/06/20 1100 09/06/20 1355   09/06/20 1100  ceFEPIme (MAXIPIME) 2 g in sodium chloride 0.9 % 100 mL IVPB        2 g 200 mL/hr over 30 Minutes Intravenous  Once 09/06/20 1047 09/06/20 1150   09/06/20 1100  metroNIDAZOLE (FLAGYL) IVPB 500 mg        500 mg 100 mL/hr over 60 Minutes Intravenous  Once 09/06/20 1047 09/06/20 1231   09/06/20 1100  vancomycin (VANCOCIN) IVPB 1000 mg/200 mL premix  Status:  Discontinued        1,000 mg 200 mL/hr over 60 Minutes Intravenous  Once 09/06/20 1047 09/06/20 1100       I have personally reviewed the following labs and images: CBC: Recent Labs  Lab 09/06/20 1014 09/06/20 1048 09/07/20 0830 09/08/20 0118  WBC 4.5  --  24.4* 17.7*  NEUTROABS 4.0  --  23.4*  --   HGB 15.8 16.7 13.3 11.8*  HCT 49.0 49.0 42.7 37.3*  MCV 93.0  --  95.3  94.0  PLT 214  --  166 137*   BMP &GFR Recent Labs  Lab 09/06/20 1014 09/06/20 1048 09/07/20 0830 09/08/20 0118 09/09/20 0135 09/09/20 0839  NA 138 142 140 141 144  --   K 4.3 4.4 4.1 4.3 3.4*  --   CL 100 104 101 102 106  --   CO2 18*  --  18* 23 24  --  GLUCOSE 186* 179* 139* 104* 99  --   BUN 20 19 26* 34* 37*  --   CREATININE 1.53* 1.30* 1.85* 1.84* 1.53*  --   CALCIUM 8.8*  --  8.3* 8.0* 8.1*  --   MG  --   --   --   --   --  1.7  PHOS  --   --   --   --   --  2.3*   Estimated Creatinine Clearance: 50.3 mL/min (A) (by C-G formula based on SCr of 1.53 mg/dL (H)). Liver & Pancreas: Recent Labs  Lab 09/06/20 1014 09/07/20 0830 09/08/20 0118  AST 38 49* 36  ALT 44 24 22  ALKPHOS 138* 75 67  BILITOT 1.5* 1.1 1.0  PROT 8.1 6.2* 5.7*  ALBUMIN 3.4* 2.5* 2.4*   No results for input(s): LIPASE, AMYLASE in the last 168 hours. No results for input(s): AMMONIA in the last 168 hours. Diabetic: Recent Labs    09/06/20 2357  HGBA1C 5.8*   Recent Labs  Lab 09/08/20 2340 09/09/20 0017 09/09/20 0352 09/09/20 0738 09/09/20 1116  GLUCAP 73 95 88 98 100*   Cardiac Enzymes: No results for input(s): CKTOTAL, CKMB, CKMBINDEX, TROPONINI in the last 168 hours. No results for input(s): PROBNP in the last 8760 hours. Coagulation Profile: Recent Labs  Lab 09/06/20 1014  INR 1.2   Thyroid Function Tests: No results for input(s): TSH, T4TOTAL, FREET4, T3FREE, THYROIDAB in the last 72 hours. Lipid Profile: No results for input(s): CHOL, HDL, LDLCALC, TRIG, CHOLHDL, LDLDIRECT in the last 72 hours. Anemia Panel: No results for input(s): VITAMINB12, FOLATE, FERRITIN, TIBC, IRON, RETICCTPCT in the last 72 hours. Urine analysis:    Component Value Date/Time   COLORURINE YELLOW 09/06/2020 1825   APPEARANCEUR HAZY (A) 09/06/2020 1825   LABSPEC 1.023 09/06/2020 1825   PHURINE 5.0 09/06/2020 1825   GLUCOSEU NEGATIVE 09/06/2020 1825   HGBUR MODERATE (A) 09/06/2020 1825    BILIRUBINUR NEGATIVE 09/06/2020 1825   KETONESUR NEGATIVE 09/06/2020 1825   PROTEINUR 30 (A) 09/06/2020 1825   UROBILINOGEN 0.2 05/27/2010 2038   NITRITE NEGATIVE 09/06/2020 1825   LEUKOCYTESUR LARGE (A) 09/06/2020 1825   Sepsis Labs: Invalid input(s): PROCALCITONIN, [place]  Microbiology: Recent Results (from the past 240 hour(s))  Culture, blood (routine x 2)     Status: Abnormal   Collection Time: 09/06/20 11:04 AM   Specimen: BLOOD RIGHT HAND  Result Value Ref Range Status   Specimen Description   Final    BLOOD RIGHT HAND BOTTLES DRAWN AEROBIC AND ANAEROBIC Performed at [facility], [address] [place], [place] [address]    Special Requests   Final    Blood Culture adequate volume Performed at [facility], [address], [place], [place] [address]    Culture  Setup Time   Final    GRAM NEGATIVE RODS Gram Stain Report Called to,Read Back By and Verified With: PRUITT,G @ 4742 ON 09/07/20 BY JUW GS DONE @ [facility] CRITICAL RESULT CALLED TO, READ BACK BY AND VERIFIED WITH: PHARMD CATHY P. [phone] FCP Performed at [facility], [place] [address], [place],  [address]    Culture KLEBSIELLA PNEUMONIAE (A)  Final   Report Status 09/09/2020 FINAL  Final   Organism ID, Bacteria KLEBSIELLA PNEUMONIAE  Final      Susceptibility   Klebsiella pneumoniae - MIC*    AMPICILLIN RESISTANT Resistant     CEFAZOLIN <=4 SENSITIVE Sensitive     CEFEPIME <=0.12 SENSITIVE Sensitive  CEFTAZIDIME <=1 SENSITIVE Sensitive     CEFTRIAXONE <=0.25 SENSITIVE Sensitive     CIPROFLOXACIN <=0.25 SENSITIVE Sensitive     GENTAMICIN <=1 SENSITIVE Sensitive     IMIPENEM <=0.25 SENSITIVE Sensitive     TRIMETH/SULFA <=20 SENSITIVE Sensitive     AMPICILLIN/SULBACTAM 4 SENSITIVE Sensitive     PIP/TAZO <=4 SENSITIVE Sensitive     * KLEBSIELLA PNEUMONIAE  Blood Culture ID Panel (Reflexed)     Status: Abnormal   Collection Time: 09/06/20 11:04 AM  Result Value Ref Range Status    Enterococcus faecalis NOT DETECTED NOT DETECTED Final   Enterococcus Faecium NOT DETECTED NOT DETECTED Final   Listeria monocytogenes NOT DETECTED NOT DETECTED Final   Staphylococcus species NOT DETECTED NOT DETECTED Final   Staphylococcus aureus (BCID) NOT DETECTED NOT DETECTED Final   Staphylococcus epidermidis NOT DETECTED NOT DETECTED Final   Staphylococcus lugdunensis NOT DETECTED NOT DETECTED Final   Streptococcus species NOT DETECTED NOT DETECTED Final   Streptococcus agalactiae NOT DETECTED NOT DETECTED Final   Streptococcus pneumoniae NOT DETECTED NOT DETECTED Final   Streptococcus pyogenes NOT DETECTED NOT DETECTED Final   A.calcoaceticus-baumannii NOT DETECTED NOT DETECTED Final   Bacteroides fragilis NOT DETECTED NOT DETECTED Final   Enterobacterales DETECTED (A) NOT DETECTED Final    Comment: Enterobacterales represent a large order of gram negative bacteria, not a single organism. CRITICAL RESULT CALLED TO, READ BACK BY AND VERIFIED WITH: PHARMD CATHY P. [phone] FCP    Enterobacter cloacae complex NOT DETECTED NOT DETECTED Final   Escherichia coli NOT DETECTED NOT DETECTED Final   Klebsiella aerogenes NOT DETECTED NOT DETECTED Final   Klebsiella oxytoca NOT DETECTED NOT DETECTED Final   Klebsiella pneumoniae DETECTED (A) NOT DETECTED Final    Comment: CRITICAL RESULT CALLED TO, READ BACK BY AND VERIFIED WITH: PHARMD CATHY P. [phone] FCP    Proteus species NOT DETECTED NOT DETECTED Final   Salmonella species NOT DETECTED NOT DETECTED Final   Serratia marcescens NOT DETECTED NOT DETECTED Final   Haemophilus influenzae NOT DETECTED NOT DETECTED Final   Neisseria meningitidis NOT DETECTED NOT DETECTED Final   Pseudomonas aeruginosa NOT DETECTED NOT DETECTED Final   Stenotrophomonas maltophilia NOT DETECTED NOT DETECTED Final   Candida albicans NOT DETECTED NOT DETECTED Final   Candida auris NOT DETECTED NOT DETECTED Final   Candida glabrata NOT DETECTED NOT  DETECTED Final   Candida krusei NOT DETECTED NOT DETECTED Final   Candida parapsilosis NOT DETECTED NOT DETECTED Final   Candida tropicalis NOT DETECTED NOT DETECTED Final   Cryptococcus neoformans/gattii NOT DETECTED NOT DETECTED Final   CTX-M ESBL NOT DETECTED NOT DETECTED Final   Carbapenem resistance IMP NOT DETECTED NOT DETECTED Final   Carbapenem resistance KPC NOT DETECTED NOT DETECTED Final   Carbapenem resistance NDM NOT DETECTED NOT DETECTED Final   Carbapenem resist OXA 48 LIKE NOT DETECTED NOT DETECTED Final   Carbapenem resistance VIM NOT DETECTED NOT DETECTED Final    Comment: Performed at [facility], [address], [place], [place] [address]  Culture, blood (routine x 2)     Status: Abnormal   Collection Time: 09/06/20 11:11 AM   Specimen: Left Antecubital; Blood  Result Value Ref Range Status   Specimen Description   Final    LEFT ANTECUBITAL BOTTLES DRAWN AEROBIC AND ANAEROBIC Performed at [facility], [address] [place], [place] [address]    Special Requests   Final    Blood Culture adequate volume Performed at  [address] [place], [place] [address]    Culture  Setup Time   Final    GRAM NEGATIVE RODS Gram Stain Report Called to,Read Back By and Verified With: PRUITT,G @ 3154 ON 09/07/20 BY JUW GS DONE @ [facility]    Culture (A)  Final    KLEBSIELLA PNEUMONIAE SUSCEPTIBILITIES PERFORMED ON PREVIOUS CULTURE WITHIN THE LAST 5 DAYS. Performed at [facility], [place] [address] [place], [place] [address]    Report Status 09/09/2020 FINAL  Final  Resp Panel by RT-PCR (Flu A&B, Covid) Nasopharyngeal Swab     Status: None   Collection Time: 09/06/20 11:13 AM   Specimen: Nasopharyngeal Swab; Nasopharyngeal(NP) swabs in vial transport medium  Result Value Ref Range Status   SARS Coronavirus 2 by RT PCR NEGATIVE NEGATIVE Final    Comment: (NOTE) SARS-CoV-2 target nucleic acids are NOT DETECTED.  The SARS-CoV-2 RNA is generally detectable  in upper respiratory specimens during the acute phase of infection. The lowest concentration of SARS-CoV-2 viral copies this assay can detect is 138 copies/mL. A negative result does not preclude SARS-Cov-2 infection and should not be used as the sole basis for treatment or other patient management decisions. A negative result may occur with  improper specimen collection/handling, submission of specimen other than nasopharyngeal swab, presence of viral mutation(s) within the areas targeted by this assay, and inadequate number of viral copies(<138 copies/mL). A negative result must be combined with clinical observations, patient history, and epidemiological information. The expected result is Negative.  Fact Sheet for Patients:  EntrepreneurPulse.com.au  Fact Sheet for Healthcare Providers:  IncredibleEmployment.be  This test is no t yet approved or cleared by the [place] FDA and  has been authorized for detection and/or diagnosis of SARS-CoV-2 by FDA under an Emergency Use Authorization (EUA). This EUA will remain  in effect (meaning this test can be used) for the duration of the COVID-19 declaration under Section 564(b)(1) of the Act, 21 U.S.C.section 360bbb-3(b)(1), unless the authorization is terminated  or revoked sooner.       Influenza A by PCR NEGATIVE NEGATIVE Final   Influenza B by PCR NEGATIVE NEGATIVE Final    Comment: (NOTE) The Xpert Xpress SARS-CoV-2/FLU/RSV plus assay is intended as an aid in the diagnosis of influenza from Nasopharyngeal swab specimens and should not be used as a sole basis for treatment. Nasal washings and aspirates are unacceptable for Xpert Xpress SARS-CoV-2/FLU/RSV testing.  Fact Sheet for Patients: EntrepreneurPulse.com.au  Fact Sheet for Healthcare Providers: IncredibleEmployment.be  This test is not yet approved or cleared by the [place] FDA and has  been authorized for detection and/or diagnosis of SARS-CoV-2 by FDA under an Emergency Use Authorization (EUA). This EUA will remain in effect (meaning this test can be used) for the duration of the COVID-19 declaration under Section 564(b)(1) of the Act, 21 U.S.C. section 360bbb-3(b)(1), unless the authorization is terminated or revoked.  Performed at [facility], [address] [place], [place] [address]   CSF culture     Status: None (Preliminary result)   Collection Time: 09/06/20  4:12 PM   Specimen: CSF; Cerebrospinal Fluid  Result Value Ref Range Status   Specimen Description   Final    CSF Performed at [facility], [address] [place],  [address]    Special Requests   Final    TUBE 1 Performed at [place] [address] [place],  [address]    Gram Stain  Final    NO ORGANISMS SEEN Performed at [facility], [address] [place], [place] [address]    Culture   Final    NO GROWTH 3 DAYS Performed at [place] [address], [place], [place] [address]    Report Status PENDING  Incomplete  Urine culture     Status: Abnormal   Collection Time: 09/06/20  6:25 PM   Specimen: Urine, Clean Catch  Result Value Ref Range Status   Specimen Description   Final    URINE, CLEAN CATCH Performed at [facility], [address] [place], [place] [address]    Special Requests   Final    NONE Performed at [facility], [address] [place], [place] [address]    Culture (A)  Final    <10,000 COLONIES/mL INSIGNIFICANT GROWTH Performed at [place] [address], [place], [place] [address]    Report Status 09/08/2020 FINAL  Final  MRSA PCR Screening     Status: None   Collection Time: 09/07/20  4:24 AM   Specimen: Nasopharyngeal  Result Value Ref Range Status   MRSA by PCR NEGATIVE NEGATIVE Final    Comment:        The GeneXpert MRSA Assay (FDA approved for NASAL specimens only), is one component of a comprehensive MRSA  colonization surveillance program. It is not intended to diagnose MRSA infection nor to guide or monitor treatment for MRSA infections. Performed at [facility], [place] [address] [place], [place] [address]   Urine culture     Status: Abnormal   Collection Time: 09/07/20  9:04 AM   Specimen: Urine, Random  Result Value Ref Range Status   Specimen Description URINE, RANDOM  Final   Special Requests NONE  Final   Culture (A)  Final    <10,000 COLONIES/mL INSIGNIFICANT GROWTH Performed at [facility], [place] [address] [place], [place] [address]    Report Status 09/08/2020 FINAL  Final  Culture, blood (routine x 2)     Status: None (Preliminary result)   Collection Time: 09/08/20 12:03 PM   Specimen: BLOOD  Result Value Ref Range Status   Specimen Description BLOOD RIGHT ANTECUBITAL  Final   Special Requests   Final    BOTTLES DRAWN AEROBIC ONLY Performed at [facility], [place] [address], [place],  [address]    Culture PENDING  Incomplete   Report Status PENDING  Incomplete    Radiology Studies: DG Retrograde Pyelogram  Result Date: 09/09/2020 CLINICAL DATA:  75 year old male undergoing retrograde ureteral stent placement. EXAM: RETROGRADE PYELOGRAM COMPARISON:  None. FINDINGS: Two fluoroscopic images submitted demonstrate right double-J nephroureteral stent in place with pigtail portions in the expected locations of the renal pelvis and bladder. No diagnostic quality iodinated contrast opacification is appreciated to evaluate the collecting system. IMPRESSION: Two fluoroscopic images submitted demonstrate right double-J nephroureteral stent in place with pigtail portions in the expected locations of the renal pelvis and bladder. Ruthann Cancer, MD [facility] Radiology Electronically Signed   By: Ruthann Cancer MD   On: 09/09/2020 08:01   DG C-Arm 1-60 Min  Result Date: 09/08/2020 CLINICAL DATA:  Cystoscopy  with retrograde pyelogram and ureteral stent placement. EXAM: DG C-ARM 1-60 MIN CONTRAST:  Not provided. FLUOROSCOPY TIME:  Fluoroscopy Time:  28.9 seconds Radiation Exposure Index (if provided by the fluoroscopic device): 11.16 mGy Number of Acquired Spot Images: 2 COMPARISON:  CT yesterday. FINDINGS: Two  fluoroscopic spot views obtained of the abdomen in frontal projection. Ovoid density projecting over the mid right distal ureter corresponding to ureteral stone on CT. There is a right ureteral stent in place. IMPRESSION: Intraoperative fluoroscopic spot views with right ureteral stone and stent in place. Electronically Signed   By: Keith Rake M.D.   On: 09/08/2020 18:30      Gilad Dugger T. Hartwick  If 7PM-7AM, please contact night-coverage [email or web address] 09/09/2020, 12:24 PM

## 2020-09-09 NOTE — Progress Notes (Signed)
Spoke with Cameron,RN for Dr Oletta Darter, notified of low blood sugar of 68, and now 95 after 45ml of D50. Patient currently NPO. Also notified of new IV access placed by IV nurse. Orders given to spot check patient at 0400, and will ask MD for additional orders. Patient currently awake, resting quietly. Will continue to monitor.

## 2020-09-09 NOTE — Anesthesia Postprocedure Evaluation (Signed)
Anesthesia Post Note  Patient: PURNELL DAIGLE  Procedure(s) Performed: CYSTOSCOPY WITH RETROGRADE PYELOGRAM/URETERAL STENT PLACEMENT (Right Ureter)     Patient location during evaluation: PACU Anesthesia Type: General Level of consciousness: awake and alert Pain management: pain level controlled Vital Signs Assessment: post-procedure vital signs reviewed and stable Respiratory status: spontaneous breathing, nonlabored ventilation, respiratory function stable and patient connected to nasal cannula oxygen Cardiovascular status: blood pressure returned to baseline and stable Postop Assessment: no apparent nausea or vomiting Anesthetic complications: no   No complications documented.  Last Vitals:  Vitals:   09/09/20 0233 09/09/20 0551  BP: (!) 115/58 123/63  Pulse: 84 70  Resp: 17 15  Temp: (!) 36.4 C 36.7 C  SpO2: 91% 92%    Last Pain:  Vitals:   09/09/20 0551  TempSrc: Axillary  PainSc:    Pain Goal:                   Lidia Collum

## 2020-09-09 NOTE — Progress Notes (Signed)
[facility] Progress Note Patient Name: [name] DOB: [birth year] MRN: [id number]   Date of Service  09/09/2020  HPI/Events of Note  Hypoglycemia - Blood glucose = 79 --> 68 --> 73. Patient in NPO and on Novolog SSI.   eICU Interventions  Plan: 1. D10W IV infusion to run at 20 mL/hour.      Intervention Category Major Interventions: Other:  Lysle Dingwall 09/09/2020, 12:39 AM

## 2020-09-09 NOTE — Evaluation (Signed)
Physical Therapy Evaluation & Discharge Patient Details Name: [name] MRN: [id number] DOB: [birth year] Today's Date: 09/09/2020   History of Present Illness  Pt is a 74 y.o. male admitted 09/06/20 with AMS. Workup for septic shock due to Klebsiella pneumonia bacteremia, pyelonephritis with obstructive uropathy. S/p cystoscopy and R ureteral stent placement 12/13. MRI with small foci of restricted diffusion involving left pons and medulla which could be acute/subacute insults or artifacts. PMH includes dementia, HTN, colon CA stage 3, Bell's palsy, HOH.    Clinical Impression  Patient evaluated by Physical Therapy with no further acute PT needs identified. PTA, pt resides at SNF, dependent for transfers to w/c with hoyer lift, assist from staff for all ADL tasks. Today, pt dependent for bed-level mobility, declined transfer OOB with maxisky lift. BLEs positioned to encourage hip IR/ADD, knee extension and bilateral heels pressure relief. All education has been completed and the patient has no further questions. Recommend return to SNF. Acute PT is signing off. Thank you for this referral.    Follow Up Recommendations SNF;Supervision for mobility/OOB (return to SNF)    Equipment Recommendations  None recommended by PT    Recommendations for Other Services       Precautions / Restrictions Precautions Precautions: Fall;Other (comment) Precaution Comments: ureteral stent, pressure wound buttocks Restrictions Weight Bearing Restrictions: No      Mobility  Bed Mobility Overal bed mobility: Needs Assistance             General bed mobility comments: TotalA for repositioning in bed and to initiate roll towards R-side, pt with minimal movement initiation to assist; declined additional attempts and declined transfer OOB with [employer]    Transfers                    Ambulation/Gait                Stairs            Wheelchair Mobility    Modified Rankin  (Stroke Patients Only)       Balance                                             Pertinent Vitals/Pain Pain Assessment: No/denies pain    Home Living Family/patient expects to be discharged to:: Skilled nursing facility                 Additional Comments: Resident at SNF    Prior Function Level of [place]: Needs assistance   Gait / Transfers Assistance Needed: Non-ambulatory at baseline; pt reports use of lift for transfer to high-back w/c and lift transfers for showering. Enjoys watching tv  ADL's / Homemaking Assistance Needed: Assist from staff for all ADL tasks        Hand Dominance        Extremity/Trunk Assessment   Upper Extremity Assessment Upper Extremity Assessment: RUE deficits/detail;LUE deficits/detail;Difficult to assess due to impaired cognition RUE Deficits / Details: Functionally <3/5 throughout, swelling in forearm/fingers/hand with flexion preference and some contracture noted; R elbow fold redness LUE Deficits / Details: Functionally <3/5 throughout, swelling in forearm/fingers/hand with flexion preference and some contracture noted; L elbow fold redness    Lower Extremity Assessment Lower Extremity Assessment: RLE deficits/detail;LLE deficits/detail;Difficult to assess due to impaired cognition RLE Deficits / Details: No active movement noted in RLE; pressure spot noted where  catheter line sitting and bilateral heels; BLEs resting with knee flex, hip abd/ER, ankle PF, toes flexed; pt reports he can feel light touch in both feet (not formally assessed) RLE Coordination: decreased fine motor;decreased gross motor LLE Deficits / Details: No active movement noted in LLE; pressure spot noted at bilateral heels; BLEs resting with knee flex, hip abd/ER, ankle PF, toes flexed; pt reports he can feel light touch in both feet (not formally assessed) LLE Coordination: decreased gross motor;decreased fine motor       Communication    Communication: Expressive difficulties;Receptive difficulties  Cognition Arousal/Alertness: Awake/alert Behavior During Therapy: WFL for tasks assessed/performed;Flat affect Overall Cognitive Status: History of cognitive impairments - at baseline                                 General Comments: H/o dementia. A&Ox0, stating "I forget my name." Perseverating on tv volume not working. Inconsistent command following. Able to state he does not walk since breaking his legs and that he transfers with staff via [place] comments (skin integrity, edema, etc.): BLEs positioned to encourage hip add/IR, knee ext and bilateral heel pressure relief; RUE elevated on pillow with fingers extended    Exercises     Assessment/Plan    PT Assessment Patent does not need any further PT services  PT Problem List         PT Treatment Interventions      PT Goals (Current goals can be found in the Care Plan section)  Acute Rehab PT Goals Patient Stated Goal: "I want to get back to my bed and tv, I know how to work them there" PT Goal Formulation: All assessment and education complete, DC therapy    Frequency     Barriers to discharge        Co-evaluation               AM-PAC PT "6 Clicks" Mobility  Outcome Measure Help needed turning from your back to your side while in a flat bed without using bedrails?: Total Help needed moving from lying on your back to sitting on the side of a flat bed without using bedrails?: Total Help needed moving to and from a bed to a chair (including a wheelchair)?: Total Help needed standing up from a chair using your arms (e.g., wheelchair or bedside chair)?: Total Help needed to walk in hospital room?: Total Help needed climbing 3-5 steps with a railing? : Total 6 Click Score: 6    End of Session   Activity Tolerance: Patient tolerated treatment well;Patient limited by fatigue Patient left: in bed;with call  bell/phone within reach;with bed alarm set Nurse Communication: Mobility status;Need for lift equipment PT Visit Diagnosis: Other abnormalities of gait and mobility (R26.89);Muscle weakness (generalized) (M62.81)    Time: 9357-0177 PT Time Calculation (min) (ACUTE ONLY): 20 min   Charges:   PT Evaluation $PT Eval Moderate Complexity: [place], PT, DPT Acute Rehabilitation Services  Pager [phone] Office [place] 09/09/2020, 3:01 PM

## 2020-09-09 NOTE — Evaluation (Signed)
Clinical/Bedside Swallow Evaluation Patient Details  Name: [name] MRN: [id number] Date of Birth: [birth year]  Today's Date: 09/09/2020 Time: SLP Start Time (ACUTE ONLY): 1350 SLP Stop Time (ACUTE ONLY): 1420 SLP Time Calculation (min) (ACUTE ONLY): 30 min  Past Medical History:  Past Medical History:  Diagnosis Date   Bell's palsy    Bleeding ulcer    remote past   Dementia    disoriented to time.does not know medical hx.information obtained from previous charts   Hard of hearing    Hyperlipemia    Hypertension    Obesity    SOB (shortness of breath) on exertion    Past Surgical History:  Past Surgical History:  Procedure Laterality Date   BIOPSY  05/09/2019   Procedure: BIOPSY;  Surgeon: Daneil Dolin, MD;  [facility];  Service: Endoscopy;;   COLONOSCOPY  10/06/06   internal hemorrhoids and anal papilla/poor prep   COLONOSCOPY N/A 05/09/2019   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 Biopsy with atrophic gastritis.    FOOT SURGERY     KNEE ARTHROSCOPY  06/17/2011   Procedure: ARTHROSCOPY KNEE;  Surgeon: Sanjuana Kava;  Location: [facility];  Service: Orthopedics;  Laterality: Right;   LAPAROSCOPIC PARTIAL COLECTOMY N/A 05/11/2019   Procedure: ATTEMPTED LAPAROSCOPIC PARTIAL COLECTOMY;  Surgeon: [name], MD;  Location: [facility];  Service: General;  Laterality: N/A;   PARTIAL COLECTOMY  05/11/2019   Procedure: PARTIAL COLECTOMY (Laparotomy incision at [place];  Surgeon: [name], MD;  Location: [facility];  Service: General;;   POLYPECTOMY  05/09/2019   Procedure: POLYPECTOMY;  Surgeon: Daneil Dolin, MD;   [facility];  Service: Endoscopy;;   HPI:  74 year old male with history of dementia, Bell's palsy, stage III colon cancer, GIB, HTN, HLD and debility presented to [facility], ED with altered mental status.  He was admitted to ICU for septic shock due to Klebsiella pneumonia bacteremia, pyelonephritis with obstructive uropathy, and transferred to [facility] for further care.  Blood culture grew Klebsiella pneumonia.  Urine culture with insignificant growth x2.  CSF culture NGTD.  Patient underwent cystoscopy with right ureteral stent placement on 09/08/2020   Assessment / Plan / Recommendation Clinical Impression  Pt exhibiting a suspected oropharyngeal dysphagia. Xerostomia noted with dried palatal and velar secretions; improving greatly post extensive oral care by SLP. Pt edentulous with impaired mastication and decreased bolus cohesion. Oral residuals noted with puree and solids (greatest with solids). Thin liquid alternation assisted to improve oral clearance. No overt s/sx of aspiration with any PO. Pt attempting to talk during mastication of solids, with hx of dementia at baseline. Recommend dysphagia 1 (puree ) and thin liquids with medicines in puree and full supervision with all PO. SLP to follow up for diet tolerance, advancement as pt tolerates.   SLP Visit Diagnosis: Dysphagia, unspecified (R13.10)    Aspiration Risk  Moderate aspiration risk    Diet Recommendation   Dysphagia 1 (puree) thin liquids  Medication Administration: Crushed with puree    Other  Recommendations Oral Care Recommendations: Oral care BID   Follow up Recommendations Skilled Nursing facility      Frequency and Duration min 1 x/week  1 week  Prognosis Prognosis for Safe Diet Advancement: Good Barriers to Reach Goals: Cognitive deficits      Swallow Study   General Date of Onset: 09/06/20 HPI: 74 year old male with history of dementia, Bell's palsy, stage III colon cancer, GIB, HTN, HLD and  debility presented to [facility], ED with altered mental status.  He was admitted to ICU for septic shock due to Klebsiella pneumonia bacteremia, pyelonephritis with obstructive uropathy, and transferred to [facility] for further care.  Blood culture grew Klebsiella pneumonia.  Urine culture with insignificant growth x2.  CSF culture NGTD.  Patient underwent cystoscopy with right ureteral stent placement on 09/08/2020 Type of Study: Bedside Swallow Evaluation Previous Swallow Assessment: none on file Diet Prior to this Study: NPO Temperature Spikes Noted: No Respiratory Status: Room air History of Recent Intubation: No Behavior/Cognition: Alert;Confused;Cooperative Oral Cavity Assessment: Dry;Dried secretions Oral Care Completed by SLP: Yes Oral Cavity - Dentition: Edentulous Vision: Impaired for self-feeding Self-Feeding Abilities: Needs assist Patient Positioning: Upright in bed Baseline Vocal Quality: Normal Volitional Cough: Cognitively unable to elicit Volitional Swallow: Unable to elicit    Oral/Motor/Sensory Function Overall Oral Motor/Sensory Function: Generalized oral weakness   Ice Chips Ice chips: Impaired Presentation: Spoon Oral Phase Impairments: Reduced lingual movement/coordination;Impaired mastication Oral Phase Functional Implications: Prolonged oral transit Pharyngeal Phase Impairments: Suspected delayed Swallow   Thin Liquid Thin Liquid: Within functional limits Presentation: Cup;Straw    Nectar Thick Nectar Thick Liquid: Not tested   Honey Thick Honey Thick Liquid: Not tested   Puree Puree: Impaired Presentation: Spoon Oral Phase Impairments: Reduced lingual movement/coordination;Impaired mastication Oral Phase Functional Implications: Prolonged oral transit;Oral residue Pharyngeal Phase Impairments: Suspected delayed Swallow   Solid     Solid: Impaired Presentation: Spoon Oral Phase Impairments: Impaired mastication Oral Phase Functional Implications: Prolonged  oral transit;Impaired mastication;Oral residue Pharyngeal Phase Impairments: Suspected delayed Swallow;Multiple swallows      [name] E Nayla Dias MA, CCC-SLP Acute Rehabilitation Services  09/09/2020,2:27 PM

## 2020-09-09 NOTE — Consult Note (Signed)
[facility]    Date of Admission:  09/06/2020     Current antibiotics: Day 3 ceftriaxone 12/12 - present  Previous antibiotics: Vancomycin 12/11 Cefepime 12/11 Flagyl 12/11 Acyclovir 12/11  Reason for Consult: Klebsiella pneumonia bacteremia     Referring Physician: Dr Cyndia Skeeters  ASSESSMENT:    #Klebsiella pneumonia bacteremia Initially presenting with septic shock secondary to urinary source due to right proximal ureteral stone status post stenting 09/08/2020.  Now improving with antibiotics and after urologic procedure.   #Leukocytosis secondary to above #Acute kidney injury secondary to above #Nursing home resident #Dementia   PLAN:    --Narrow to cefazolin --If continues to improve, anticipate transitioning to oral antibiotics to complete course in the next day or so --Will continue to follow   MEDICATIONS:    Scheduled Meds: . atorvastatin  5 mg Oral QHS  . chlorhexidine  15 mL Mouth Rinse BID  . Chlorhexidine Gluconate Cloth  6 each Topical Daily  . feeding supplement  237 mL Oral BID BM  . ferrous sulfate  325 mg Oral Q1200  . influenza vaccine adjuvanted  0.5 mL Intramuscular Tomorrow-1000  . insulin aspart  0-15 Units Subcutaneous TID WC  . insulin aspart  0-5 Units Subcutaneous QHS  . mouth rinse  15 mL Mouth Rinse q12n4p  . multivitamin with minerals  1 tablet Oral Daily    Continuous Infusions: . sodium chloride 250 mL (09/07/20 1150)  .  ceFAZolin (ANCEF) IV    . dextrose 20 mL/hr at 09/09/20 0147  . potassium chloride 10 mEq (09/09/20 1127)    PRN Meds: acetaminophen **OR** acetaminophen, ondansetron **OR** ondansetron (ZOFRAN) IV, oxyCODONE, sodium chloride flush  HPI:    [name] is a 74 y.o. male with a past medical history of hypertension, hyperlipidemia, dementia, Bell's palsy who was admitted December 11 after presenting with encephalopathy from his nursing home.  On admission he was found to be febrile  with lactic acidosis, leukocytosis.  Initially, there was concern for meningitis and he was started on broad-spectrum antibiotics.  He underwent lumbar puncture that revealed protein 155, glucose 94, 3 WBC, 2 RBC.  Urinalysis was notable for mild pyuria.  Urine culture obtained was insignificant.  However, blood cultures are positive for Klebsiella pneumonia in 2 sets.  He also underwent CT angiography of the abdomen pelvis which revealed a 7 mm calculus in the proximal third of the right ureter with mild associated hydronephrosis.  Reactive fat stranding about the right kidney and the adjacent transverse portion of the duodenum also noted.  CT also showed small bilateral renal stones and bladder wall thickening consistent with bladder outlet obstruction.  Patient initially was admitted to the ICU requiring vasopressors.  He was seen by urology and taken to the OR December 13 for cystoscopy with retrograde pyelogram and right ureteral stent insertion.  Clinically, he continues to improve and has been afebrile over the last 24 hours with improving leukocytosis.  He has been transferred out of the ICU to the medical floor.  Currently on Ceftriaxone day #3.      Past Medical History:  Diagnosis Date  . Bell's palsy   . Bleeding ulcer    remote past  . Dementia    disoriented to time.does not know medical hx.information obtained from previous charts  . Hard of hearing   . Hyperlipemia   . Hypertension   . Obesity   . SOB (shortness of breath) on exertion  Social History   Tobacco Use  . Smoking status: Former Smoker    Packs/day: 1.00    Years: 30.00    Pack years: 30.00    Types: Cigarettes  . Smokeless tobacco: Never Used  . Tobacco comment: quit a "long time ago"   Vaping Use  . Vaping Use: Never used  Substance Use Topics  . Alcohol use: No  . Drug use: No    Family History  Problem Relation Age of Onset  . COPD Mother   . Cancer Brother   . Anesthesia problems Neg Hx   .  Hypotension Neg Hx   . Malignant hyperthermia Neg Hx   . Pseudochol deficiency Neg Hx   . Colon cancer Neg Hx     Allergies  Allergen Reactions  . Hydrocodone   . Hydrocodone-Acetaminophen     unknown  . Aplisol [Tuberculin Ppd] Rash    Review of Systems  Constitutional: Negative for chills and fever.  Respiratory: Negative.   Cardiovascular: Negative.   Gastrointestinal: Negative for abdominal pain, nausea and vomiting.  Genitourinary: Negative for dysuria and flank pain.    All other systems reviewed and are negative.  OBJECTIVE:   Blood pressure 123/63, pulse 70, temperature 98 F (36.7 C), temperature source Axillary, resp. rate 15, height 5' 9.02" (1.753 m), weight 107 kg, SpO2 92 %. Body mass index is 34.82 kg/m.  Physical Exam Constitutional:      General: He is not in acute distress.    Appearance: Normal appearance. He is obese.  HENT:     Head: Normocephalic and atraumatic.  Cardiovascular:     Rate and Rhythm: Normal rate and regular rhythm.  Pulmonary:     Effort: Pulmonary effort is normal. No respiratory distress.     Breath sounds: Normal breath sounds.  Abdominal:     General: Abdomen is flat. There is no distension.     Palpations: Abdomen is soft.     Tenderness: There is no abdominal tenderness.  Genitourinary:    Comments: Foley catheter in place. Musculoskeletal:     Cervical back: Normal range of motion and neck supple.     Comments: Left UE midline.  Skin:    General: Skin is warm and dry.     Findings: No rash.  Neurological:     General: No focal deficit present.     Mental Status: He is alert. Mental status is at baseline.       Lab Results & Microbiology Lab Results  Component Value Date   WBC 17.7 (H) 09/08/2020   HGB 11.8 (L) 09/08/2020   HCT 37.3 (L) 09/08/2020   MCV 94.0 09/08/2020   PLT 137 (L) 09/08/2020    Lab Results  Component Value Date   NA 144 09/09/2020   K 3.4 (L) 09/09/2020   CO2 24 09/09/2020    GLUCOSE 99 09/09/2020   BUN 37 (H) 09/09/2020   CREATININE 1.53 (H) 09/09/2020   CALCIUM 8.1 (L) 09/09/2020   GFRNONAA 47 (L) 09/09/2020   GFRAA >60 03/25/2020    Lab Results  Component Value Date   ALT 22 09/08/2020   AST 36 09/08/2020   ALKPHOS 67 09/08/2020   BILITOT 1.0 09/08/2020    C-Reactive Protein  No results found for: CRP  Erythrocyte Sedimentation Rate     Component Value Date/Time   ESRSEDRATE 8 08/12/2011 0449      I have reviewed the micro and lab results in Epic.  Imaging DG Retrograde Pyelogram  Result Date: 09/09/2020 CLINICAL DATA:  74 year old male undergoing retrograde ureteral stent placement. EXAM: RETROGRADE PYELOGRAM COMPARISON:  None. FINDINGS: Two fluoroscopic images submitted demonstrate right double-J nephroureteral stent in place with pigtail portions in the expected locations of the renal pelvis and bladder. No diagnostic quality iodinated contrast opacification is appreciated to evaluate the collecting system. IMPRESSION: Two fluoroscopic images submitted demonstrate right double-J nephroureteral stent in place with pigtail portions in the expected locations of the renal pelvis and bladder. Ruthann Cancer, MD [facility] Electronically Signed   By: Ruthann Cancer MD   On: 09/09/2020 08:01   DG C-Arm 1-60 Min  Result Date: 09/08/2020 CLINICAL DATA:  Cystoscopy with retrograde pyelogram and ureteral stent placement. EXAM: DG C-ARM 1-60 MIN CONTRAST:  Not provided. FLUOROSCOPY TIME:  Fluoroscopy Time:  28.9 seconds Radiation Exposure Index (if provided by the fluoroscopic device): 11.16 mGy Number of Acquired Spot Images: 2 COMPARISON:  CT yesterday. FINDINGS: Two fluoroscopic spot views obtained of the abdomen in frontal projection. Ovoid density projecting over the mid right distal ureter corresponding to ureteral stone on CT. There is a right ureteral stent in place. IMPRESSION: Intraoperative  fluoroscopic spot views with right ureteral stone and stent in place. Electronically Signed   By: Keith Rake M.D.   On: 09/08/2020 18:30   ECHOCARDIOGRAM COMPLETE  Result Date: 09/07/2020    ECHOCARDIOGRAM REPORT   Patient Name:   AKEEL REFFNER Date of Exam: 09/07/2020 Medical Rec #:  [id number]   Height:       69.0 in Accession #:    [id number]  Weight:       235.9 lb Date of Birth:  [birth year]   BSA:          2.216 m Patient Age:    44 years    BP:           109/48 mmHg Patient Gender: M           HR:           103 bpm. Exam Location:  Inpatient Procedure: Limited Echo Indications:    Acute respiratory failure  History:        Patient has prior history of Echocardiogram examinations, most                 recent 05/09/2019. Signs/Symptoms:Altered Mental Status; Risk                 Factors:Hypertension and Dyslipidemia. Dementia.  Sonographer:    Dustin Flock Referring Phys: [id number] [place]  Sonographer Comments: Technically challenging study due to limited acoustic windows. Image acquisition challenging due to patient behavioral factors. and Image acquisition challenging due to uncooperative patient. IMPRESSIONS  1. Limited windows for image acquisition and forshortened images limit accurate assessment of LV function but grossly appears normal.  2. Left ventricular ejection fraction, by estimation, is 55 to 60%. The left ventricle has normal function. Left ventricular endocardial border not optimally defined to evaluate regional wall motion. Left ventricular diastolic function could not be evaluated.  3. Right ventricular systolic function was not well visualized. The right ventricular size is not well visualized. Tricuspid regurgitation signal is inadequate for assessing PA pressure.  4. The mitral valve is normal in structure. No evidence of mitral valve regurgitation. No evidence of mitral stenosis.  5. The aortic valve is normal in structure. Aortic valve regurgitation is not visualized. No  aortic stenosis is present.  6. The inferior vena cava is  normal in size with greater than 50% respiratory variability, suggesting right atrial pressure of 3 mmHg. FINDINGS  Left Ventricle: Left ventricular ejection fraction, by estimation, is 55 to 60%. The left ventricle has normal function. Left ventricular endocardial border not optimally defined to evaluate regional wall motion. The left ventricular internal cavity size was normal in size. There is no left ventricular hypertrophy. Left ventricular diastolic function could not be evaluated. Right Ventricle: The right ventricular size is not well visualized. Right vetricular wall thickness was not assessed. Right ventricular systolic function was not well visualized. Tricuspid regurgitation signal is inadequate for assessing PA pressure. Left Atrium: Left atrial size was normal in size. Right Atrium: Right atrial size was not well visualized. Pericardium: There is no evidence of pericardial effusion. Mitral Valve: The mitral valve is normal in structure. No evidence of mitral valve regurgitation. No evidence of mitral valve stenosis. Tricuspid Valve: The tricuspid valve is normal in structure. Tricuspid valve regurgitation is not demonstrated. No evidence of tricuspid stenosis. Aortic Valve: The aortic valve is normal in structure. Aortic valve regurgitation is not visualized. No aortic stenosis is present. Pulmonic Valve: The pulmonic valve was normal in structure. Pulmonic valve regurgitation is not visualized. No evidence of pulmonic stenosis. Aorta: The aortic root is normal in size and structure and aortic root could not be assessed. Venous: The inferior vena cava was not well visualized. The inferior vena cava is normal in size with greater than 50% respiratory variability, suggesting right atrial pressure of 3 mmHg. IAS/Shunts: No atrial level shunt detected by color flow Doppler.  LEFT VENTRICLE PLAX 2D LVIDd:         5.20 cm LVIDs:         3.90 cm LV PW:          1.10 cm LV IVS:        1.10 cm LVOT diam:     2.60 cm LVOT Area:     5.31 cm  LEFT ATRIUM         Index LA diam:    3.50 cm 1.58 cm/m   AORTA Ao Root diam: 3.30 cm  SHUNTS Systemic Diam: 2.60 cm Fransico Him MD Electronically signed by Fransico Him MD Signature Date/Time: 09/07/2020/3:26:13 PM    Final    CT ANGIO ABDOMEN PELVIS  W &/OR WO CONTRAST  Result Date: 09/07/2020 CLINICAL DATA:  Mesenteric ischemia EXAM: CTA ABDOMEN AND PELVIS WITHOUT AND WITH CONTRAST TECHNIQUE: Multidetector CT imaging of the abdomen and pelvis was performed using the standard protocol during bolus administration of intravenous contrast. Multiplanar reconstructed images and MIPs were obtained and reviewed to evaluate the vascular anatomy. CONTRAST:  85mL OMNIPAQUE IOHEXOL 350 MG/ML SOLN COMPARISON:  12/25/2018 FINDINGS: VASCULAR Scattered aortic atherosclerosis. The aorta is normal in contour and caliber with a standard branching pattern and solitary bilateral renal arteries. The branch vessels, including the superior mesenteric artery, are widely patent from their origins. Review of the MIP images confirms the above findings. NON-VASCULAR Lower chest: Trace bilateral pleural effusions and associated atelectasis or consolidation. Hepatobiliary: No solid liver abnormality is seen. No gallstones, gallbladder wall thickening, or biliary dilatation. Pancreas: Unremarkable. No pancreatic ductal dilatation or surrounding inflammatory changes. Spleen: Normal in size without significant abnormality. Adrenals/Urinary Tract: Adrenal glands are unremarkable. Multiple small bilateral renal calculi. There is a 7 mm calculus in the proximal third of right ureter with mild associated hydronephrosis (series 10, image 132). Fat stranding about the right kidney and the adjacent transverse portion of the  duodenum (series 10, image 120). Wall thickening and multiple small diverticula of the urinary bladder. Stomach/Bowel: Stomach is within  normal limits. Postoperative findings of the right hemicolon, likely terminal ileocolectomy. No evidence of bowel wall thickening, distention, or inflammatory changes. Lymphatic: No enlarged abdominal or pelvic lymph nodes. Reproductive: No mass or other significant abnormality. Other: No abdominal wall hernia or abnormality. No abdominopelvic ascites. Musculoskeletal: No acute or significant osseous findings. IMPRESSION: 1. The aorta is normal in contour and caliber with a standard branching pattern and solitary bilateral renal arteries. Scattered aortic atherosclerosis. The branch vessels, including the superior mesenteric artery, are widely patent from their origins. No evidence of mesenteric ischemia. 2. There is a 7 mm calculus in the proximal third of right ureter with mild associated hydronephrosis. Reactive fat stranding about the right kidney and the adjacent transverse portion of the duodenum. 3. Multiple additional small bilateral renal calculi. 4. Wall thickening and multiple small diverticula of the urinary bladder, suggestive of chronic outlet obstruction. 5. Trace bilateral pleural effusions and associated atelectasis or consolidation. Aortic Atherosclerosis (ICD10-I70.0). Electronically Signed   By: Eddie Candle M.D.   On: 09/07/2020 13:11        [facility] [phone] pager 09/09/2020, 11:48 AM

## 2020-09-10 ENCOUNTER — Other Ambulatory Visit: Payer: Self-pay | Admitting: Urology

## 2020-09-10 DIAGNOSIS — E441 Mild protein-calorie malnutrition: Secondary | ICD-10-CM

## 2020-09-10 DIAGNOSIS — G9340 Encephalopathy, unspecified: Secondary | ICD-10-CM

## 2020-09-10 DIAGNOSIS — Z515 Encounter for palliative care: Secondary | ICD-10-CM

## 2020-09-10 LAB — CBC WITH DIFFERENTIAL/PLATELET
Abs Immature Granulocytes: 0.12 10*3/uL — ABNORMAL HIGH (ref 0.00–0.07)
Basophils Absolute: 0 10*3/uL (ref 0.0–0.1)
Basophils Relative: 0 %
Eosinophils Absolute: 0 10*3/uL (ref 0.0–0.5)
Eosinophils Relative: 0 %
HCT: 31.5 % — ABNORMAL LOW (ref 39.0–52.0)
Hemoglobin: 10.3 g/dL — ABNORMAL LOW (ref 13.0–17.0)
Immature Granulocytes: 1 %
Lymphocytes Relative: 8 %
Lymphs Abs: 0.7 10*3/uL (ref 0.7–4.0)
MCH: 30.1 pg (ref 26.0–34.0)
MCHC: 32.7 g/dL (ref 30.0–36.0)
MCV: 92.1 fL (ref 80.0–100.0)
Monocytes Absolute: 0.6 10*3/uL (ref 0.1–1.0)
Monocytes Relative: 7 %
Neutro Abs: 7.4 10*3/uL (ref 1.7–7.7)
Neutrophils Relative %: 84 %
Platelets: 121 10*3/uL — ABNORMAL LOW (ref 150–400)
RBC: 3.42 MIL/uL — ABNORMAL LOW (ref 4.22–5.81)
RDW: 13.4 % (ref 11.5–15.5)
WBC: 8.9 10*3/uL (ref 4.0–10.5)
nRBC: 0 % (ref 0.0–0.2)

## 2020-09-10 LAB — COMPREHENSIVE METABOLIC PANEL
ALT: 81 U/L — ABNORMAL HIGH (ref 0–44)
AST: 161 U/L — ABNORMAL HIGH (ref 15–41)
Albumin: 1.9 g/dL — ABNORMAL LOW (ref 3.5–5.0)
Alkaline Phosphatase: 112 U/L (ref 38–126)
Anion gap: 10 (ref 5–15)
BUN: 26 mg/dL — ABNORMAL HIGH (ref 8–23)
CO2: 27 mmol/L (ref 22–32)
Calcium: 8 mg/dL — ABNORMAL LOW (ref 8.9–10.3)
Chloride: 106 mmol/L (ref 98–111)
Creatinine, Ser: 1.26 mg/dL — ABNORMAL HIGH (ref 0.61–1.24)
GFR, Estimated: 59 mL/min — ABNORMAL LOW (ref >60)
Glucose, Bld: 144 mg/dL — ABNORMAL HIGH (ref 70–99)
Potassium: 3.5 mmol/L (ref 3.5–5.1)
Sodium: 143 mmol/L (ref 135–145)
Total Bilirubin: 0.9 mg/dL (ref 0.3–1.2)
Total Protein: 5.4 g/dL — ABNORMAL LOW (ref 6.5–8.1)

## 2020-09-10 LAB — PHOSPHORUS: Phosphorus: 2.2 mg/dL — ABNORMAL LOW (ref 2.5–4.6)

## 2020-09-10 LAB — CSF CULTURE W GRAM STAIN
Culture: NO GROWTH
Gram Stain: NONE SEEN

## 2020-09-10 LAB — AMMONIA: Ammonia: 21 umol/L (ref 9–35)

## 2020-09-10 LAB — MAGNESIUM: Magnesium: 2.1 mg/dL (ref 1.7–2.4)

## 2020-09-10 LAB — GLUCOSE, CAPILLARY
Glucose-Capillary: 157 mg/dL — ABNORMAL HIGH (ref 70–99)
Glucose-Capillary: 153 mg/dL — ABNORMAL HIGH (ref 70–99)
Glucose-Capillary: 136 mg/dL — ABNORMAL HIGH (ref 70–99)
Glucose-Capillary: 131 mg/dL — ABNORMAL HIGH (ref 70–99)

## 2020-09-10 LAB — LACTIC ACID, PLASMA: Lactic Acid, Venous: 1.2 mmol/L (ref 0.5–1.9)

## 2020-09-10 LAB — TSH: TSH: 1.567 u[IU]/mL (ref 0.350–4.500)

## 2020-09-10 MED ORDER — CEPHALEXIN 500 MG PO CAPS
500.0000 mg | ORAL_CAPSULE | Freq: Four times a day (QID) | ORAL | Status: DC
  Administered 2020-09-10 – 2020-09-11 (×6): 500 mg via ORAL
  Filled 2020-09-10 (×6): qty 1

## 2020-09-10 MED ORDER — POTASSIUM PHOSPHATES 15 MMOLE/5ML IV SOLN
20.0000 mmol | Freq: Once | INTRAVENOUS | Status: AC
  Administered 2020-09-10: 23:00:00 20 mmol via INTRAVENOUS
  Filled 2020-09-10 (×3): qty 6.67

## 2020-09-10 NOTE — Evaluation (Signed)
Occupational Therapy Evaluation Patient Details Name: [name] MRN: [id number] DOB: April 08, 1946 Today's Date: 09/10/2020    History of Present Illness Pt is a 74 y.o. male admitted 09/06/20 with AMS. Workup for septic shock due to Klebsiella pneumonia bacteremia, pyelonephritis with obstructive uropathy. S/p cystoscopy and R ureteral stent placement 12/13. MRI with small foci of restricted diffusion involving left pons and medulla which could be acute/subacute insults or artifacts. PMH includes dementia, HTN, colon CA stage 3, Bell's palsy, HOH.   Clinical Impression   OT eval initiated and completed with no further acute OT services indicated at this time. Pt is a resident at SNF, dependent for ADLs/selfcare,  transfers to w/c with hoyer lift. Pt currently  dependent for bed-level mobility, self feeding and selfcare. OT will sign off and pt to return to SNF once medically ready  Follow Up Recommendations  No OT follow up, back to SNF   Equipment Recommendations  None recommended by OT    Recommendations for Other Services       Precautions / Restrictions Precautions Precautions: Fall;Other (comment) Precaution Comments: ureteral stent, pressure wound buttocks Restrictions Weight Bearing Restrictions: No      Mobility Bed Mobility Overal bed mobility: Needs Assistance Bed Mobility: Rolling Rolling: Total assist              Transfers                      Balance                                           ADL either performed or assessed with clinical judgement   ADL                                         General ADL Comments: pt is total A at baseline for ADLs/selfcare, feeding and requires hoyer lift for transfers at [facility]     Vision Patient Visual Report: No change from baseline       Perception     Praxis      Pertinent Vitals/Pain Pain Assessment: No/denies pain     Hand Dominance Right    Extremity/Trunk Assessment Upper Extremity Assessment Upper Extremity Assessment: RUE deficits/detail;Difficult to assess due to impaired cognition RUE Deficits / Details: Functionally <3/5 throughout, swelling in forearm/fingers/hand with flexion preference and some contracture noted; R elbow fold redness RUE Coordination: decreased fine motor;decreased gross motor   Lower Extremity Assessment Lower Extremity Assessment: Defer to PT evaluation       Communication Communication Communication: Expressive difficulties;Receptive difficulties   Cognition Arousal/Alertness: Awake/alert Behavior During Therapy: WFL for tasks assessed/performed;Flat affect Overall Cognitive Status: History of cognitive impairments - at baseline                                 General Comments: H/o dementia. A&Ox0, stating "I forget things alot."  Inconsistent command following. Able to state he transfers with staff via [place] expects to be discharged to:: Skilled nursing facility  Additional Comments: Resident at SNF      Prior Functioning/Environment Level of Independence: Needs assistance  Gait / Transfers Assistance Needed: Non-ambulatory at baseline; pt reports use of lift for transfer to high-back w/c and lift transfers for showering. Enjoys watching tv ADL's / Homemaking Assistance Needed: Assist from staff for all ADL tasks   Comments: SNF resident, sounds like hoyer lift to w/c and hoyer lift for showers at baseline, primarily bedbound, SNF staff assistes with feeding        OT Problem List: Decreased strength;Impaired balance (sitting and/or standing);Decreased activity tolerance;Decreased range of motion;Decreased coordination;Pain;Decreased cognition      OT Treatment/Interventions:      OT Goals(Current goals can be found in the  care plan section) Acute Rehab OT Goals Patient Stated Goal: "I want to go back to where my own bed and TV is" OT Goal Formulation: With patient  OT Frequency:     Barriers to D/C:            Co-evaluation              AM-PAC OT "6 Clicks" Daily Activity     Outcome Measure Help from another person eating meals?: Total Help from another person taking care of personal grooming?: Total Help from another person toileting, which includes using toliet, bedpan, or urinal?: Total Help from another person bathing (including washing, rinsing, drying)?: Total Help from another person to put on and taking off regular upper body clothing?: Total Help from another person to put on and taking off regular lower body clothing?: Total 6 Click Score: 6   End of Session    Activity Tolerance: Patient tolerated treatment well Patient left: in bed  OT Visit Diagnosis: Other abnormalities of gait and mobility (R26.89);Other symptoms and signs involving cognitive function;Muscle weakness (generalized) (M62.81)                Time: 5329-9242 OT Time Calculation (min): 20 min Charges:  OT General Charges $OT Visit: 1 Visit OT Evaluation $OT Eval Moderate Complexity: 1 Mod    Britt Bottom 09/10/2020, 1:33 PM

## 2020-09-10 NOTE — Progress Notes (Signed)
[name]  Date of Admission:  09/06/2020           Current antibiotics: Day 2 Cefazolin 12/14- present  Previous antibiotics: Ceftriaxone 12/12-12/14 Vancomycin 12/11 Cefepime 12/11 Flagyl 12/11 Acyclovir 12/11  Reason for visit: Follow up on Klebsiella pneumonia bacteremia  Interval events: No acute events noted Patient remains afebrile WBC normalized Repeat blood cultures drawn 12/13 remain no growth x2 Narrowed to cefazolin LFTs newly elevated AKI improving   ASSESSMENT:    #Klebsiella pneumonia bacteremia Presented with septic shock secondary to urinary source from right proximal ureteral stone status post stenting 12/13  PLAN:    --Transition to oral cephalexin 500 mg 4 times daily to complete course --Recommend 7 days from date of stenting.  End date 12/20 --Patient scheduled for right ureteroscopic stone extraction 09/30/2020 with urology.  Will defer antimicrobial prophylaxis prior to procedure to urology --We will sign off for the time being, please call with questions  MEDICATIONS:    Scheduled Meds: . atorvastatin  5 mg Oral QHS  . chlorhexidine  15 mL Mouth Rinse BID  . Chlorhexidine Gluconate Cloth  6 each Topical Daily  . feeding supplement  237 mL Oral BID BM  . ferrous sulfate  325 mg Oral Q1200  . influenza vaccine adjuvanted  0.5 mL Intramuscular Tomorrow-1000  . insulin aspart  0-15 Units Subcutaneous TID WC  . insulin aspart  0-5 Units Subcutaneous QHS  . mouth rinse  15 mL Mouth Rinse q12n4p  . multivitamin with minerals  1 tablet Oral Daily    Continuous Infusions: . sodium chloride 250 mL (09/07/20 1150)  .  ceFAZolin (ANCEF) IV 2 g (09/10/20 0612)  . dextrose 5% lactated ringers with KCl 20 mEq/L 75 mL/hr at 09/10/20 0611    PRN Meds: acetaminophen **OR** acetaminophen, ondansetron **OR** ondansetron (ZOFRAN) IV, oxyCODONE, sodium chloride flush  SUBJECTIVE:   Patient is resting comfortably in  bed.  He denies fevers.  He denies pain.  He denies nausea or vomiting.  He voices no urinary complaints.  Review of Systems: As noted above.  All other systems reviewed and are negative.   OBJECTIVE:   Allergies  Allergen Reactions  . Hydrocodone   . Hydrocodone-Acetaminophen     unknown  . Aplisol [Tuberculin Ppd] Rash    Blood pressure (!) 144/71, pulse 76, temperature 98 F (36.7 C), resp. rate 17, height 5' 9.02" (1.753 m), weight 107 kg, SpO2 95 %. Body mass index is 34.82 kg/m.  Physical Exam Constitutional:      Comments: Elderly man, sitting up in bed, pleasantly demented, no acute distress  HENT:     Head: Normocephalic and atraumatic.  Pulmonary:     Effort: Pulmonary effort is normal. No respiratory distress.  Skin:    General: Skin is warm and dry.     Findings: No rash.  Neurological:     General: No focal deficit present.     Mental Status: Mental status is at baseline.       Lab Results & Microbiology Lab Results  Component Value Date   WBC 8.9 09/10/2020   HGB 10.3 (L) 09/10/2020   HCT 31.5 (L) 09/10/2020   MCV 92.1 09/10/2020   PLT 121 (L) 09/10/2020    Lab Results  Component Value Date   NA 143 09/10/2020   K 3.5 09/10/2020   CO2 27 09/10/2020   GLUCOSE 144 (H) 09/10/2020   BUN 26 (H) 09/10/2020   CREATININE  1.26 (H) 09/10/2020   CALCIUM 8.0 (L) 09/10/2020   GFRNONAA 59 (L) 09/10/2020   GFRAA >60 03/25/2020    Lab Results  Component Value Date   ALT 81 (H) 09/10/2020   AST 161 (H) 09/10/2020   ALKPHOS 112 09/10/2020   BILITOT 0.9 09/10/2020     I have reviewed the micro and lab results in Epic.  Imaging DG Retrograde Pyelogram  Result Date: 09/09/2020 CLINICAL DATA:  74 year old male undergoing retrograde ureteral stent placement. EXAM: RETROGRADE PYELOGRAM COMPARISON:  None. FINDINGS: Two fluoroscopic images submitted demonstrate right double-J nephroureteral stent in place with pigtail portions in the expected locations of  the renal pelvis and bladder. No diagnostic quality iodinated contrast opacification is appreciated to evaluate the collecting system. IMPRESSION: Two fluoroscopic images submitted demonstrate right double-J nephroureteral stent in place with pigtail portions in the expected locations of the renal pelvis and bladder. Ruthann Cancer, MD [facility] Electronically Signed   By: Ruthann Cancer MD   On: 09/09/2020 08:01   DG C-Arm 1-60 Min  Result Date: 09/08/2020 CLINICAL DATA:  Cystoscopy with retrograde pyelogram and ureteral stent placement. EXAM: DG C-ARM 1-60 MIN CONTRAST:  Not provided. FLUOROSCOPY TIME:  Fluoroscopy Time:  28.9 seconds Radiation Exposure Index (if provided by the fluoroscopic device): 11.16 mGy Number of Acquired Spot Images: 2 COMPARISON:  CT yesterday. FINDINGS: Two fluoroscopic spot views obtained of the abdomen in frontal projection. Ovoid density projecting over the mid right distal ureter corresponding to ureteral stone on CT. There is a right ureteral stent in place. IMPRESSION: Intraoperative fluoroscopic spot views with right ureteral stone and stent in place. Electronically Signed   By: Keith Rake M.D.   On: 09/08/2020 18:30      [facility] [phone] pager 09/10/2020, 9:56 AM   I have spent a total of 25 minutes with the patient reviewing hospital notes,  test results, labs and examining the patient as well as establishing an assessment and plan.

## 2020-09-10 NOTE — Progress Notes (Addendum)
PROGRESS NOTE   [name]  GYB:[id number]    DOB: [birth year]    DOA: 09/06/2020  PCP: Caprice Renshaw, MD   I have briefly reviewed patients previous medical records in [facility].  Chief Complaint  Patient presents with  . Code Sepsis    Brief Narrative:  74 year old male with history of dementia, Bell's palsy, stage III colon cancer, GI bleed, hypertension, hyperlipidemia and debility initially presented to the [facility] ED with altered mental status.  He was admitted to ICU for septic shock due to Klebsiella pneumonia bacteremia and pyelonephritis with obstructive uropathy.  He was transferred to [facility] for further care.  Blood cultures grew Klebsiella pneumonia.  Urine culture with insignificant growth x2.  CSF culture NGTD.  Patient underwent cystoscopy with right ureteral stent placement on 09/08/2020.  Antibiotics escalated to IV ceftriaxone.  Following stabilization, his care was transferred to [facility] on 09/09/2020.  There was also concern about a stroke on presentation and neurology was consulted in ED and recommended MRI with and without contrast to exclude metastasis to brain.  MRI brain without contrast reported as small foci of restricted diffusion involving the left pons and medulla which could be acute/subacute insults or artifacts.   Assessment & Plan:  Principal Problem:   Bacteremia due to Klebsiella pneumoniae Active Problems:   Essential hypertension   AKI (acute kidney injury) (HCC)   Lactic acidosis   Mild protein-calorie malnutrition (HCC)   Septic shock (HCC)   Pressure injury of skin   Severe sepsis with septic shock due to Klebsiella pneumonia bacteremia and pyelonephritis with obstructive uropathy, POA  Met sepsis criteria on admission with fever, tachycardia, tachypnea, hypotension, lactic acidosis, AKI and encephalopathy.  Required IV pressors, now off.  Shock and sepsis physiology resolved.  Culture data as noted below.  TTE without  significant findings.  Cefepime and vancomycin 12/11-12/12>> ceftriaxone 12/14>> IV Ancef>>  S/p cystoscopy with right ureteral stent placement on 12/13  ID consultation and follow-up appreciated.  Afebrile, leukocytosis resolved, repeat blood cultures drawn 12/13: NGTD, transitioned to oral Keflex on 12/15 to complete 7-day course from date of stenting, end date 12/20.  ID signed off 12/15.  As per urology, patient scheduled for right ureteroscopic stone extraction 09/30/2020.  Right ureteral stone with mild to moderate hydronephrosis  S/p cystoscopy and ureteral stent  Urology follow-up appreciated and plan for right ureteroscopic stone extraction on 09/30/2020  As per Dr. Jeffie Pollock recommendations, discontinue Foley catheter on 12/15  Antibiotics as noted above.  Mild transaminitis  Unclear etiology.  No GI symptoms.  Follow CMP in a.m.  Acute toxic metabolic encephalopathy:  Due to sepsis complicating underlying severe dementia.  Treat underlying cause as noted above.  Oriented only to self and state that this is "[facility]".  Family to comment regarding his baseline mental status.  Delirium precautions.  Dysphagia:  SLP input 12/14 appreciated and recommended dysphagia 1 (pured) and thin liquids.  Monitor for input.  Acute kidney injury  Suspected due to combination of prerenal from dehydration and ATN from sepsis  Improving.  Follow daily BMP.  High anion gap metabolic acidosis/lactic acidosis  Resolved.  Debility/physical deconditioning/functional quadriplegia  Per therapies input, return to SNF.  History of colon cancer stage III  Outpatient follow-up.  Essential hypertension  Controlled  Holding atenolol and Lasix.  Type II DM, controlled  A1c 5.8.  Appears to be on Metformin at home, currently held.  History of GI bleed  No overt GI  bleed reported.  Hemoglobin gradually drifting down, likely dilutional and due to acute infectious  etiology.  Continue to follow CBC and transfuse if hemoglobin 7 g or less.  Acute anemia  Follow CBC and transfuse if hemoglobin 7 g or less.  Hypokalemia/hypomagnesemia/hypophosphatemia  Hypokalemia and hypomagnesemia corrected.  Replace phosphorus and follow.  Thrombocytopenia  Likely related to acute infectious etiology or IV antibiotics.  Follow CBC daily.  Leukocytosis with left shift  Secondary to sepsis.  Resolved.  Goals of care  Patient with significant comorbidities as noted.  Currently full code which is felt to pose more harm than benefit if he were to have a code situation.  Prior MD was unable to reach legal guardian over the phone-we will attempt again.  Palliative care consulted.  Body mass index is 34.82 kg/m./Obesity.  Nutritional Status Nutrition Problem: Increased nutrient needs Etiology: wound healing Signs/Symptoms: estimated needs Interventions: Ensure Enlive (each supplement provides 350kcal and 20 grams of protein),MVI  Pressure Injury 09/07/20 Buttocks Mid Stage 2 -  Partial thickness loss of dermis presenting as a shallow open injury with a red, pink wound bed without slough. (Active)  09/07/20 0530  Location: Buttocks  Location Orientation: Mid  Staging: Stage 2 -  Partial thickness loss of dermis presenting as a shallow open injury with a red, pink wound bed without slough.  Wound Description (Comments):   Present on Admission: Yes       DVT prophylaxis: SCDs Start: 09/07/20 0549 SCDs Start: 09/06/20 2333     Code Status: Full Code Family Communication: None at bedside Disposition:  Status is: Inpatient  Remains inpatient appropriate because:Inpatient level of care appropriate due to severity of illness   Dispo: The patient is from: SNF              Anticipated d/c is to: SNF              Anticipated d/c date is: 2 days              Patient currently is not medically stable to d/c.        Consultants:    PCCM Urology Infectious disease Palliative care medicine  Procedures:   08/27/2020-lumbar puncture 09/08/2020-cystoscopy with right ureteral stent placement by Dr. Onnie Graham catheter-discontinued 12/15  Antimicrobials:    Microbiology summarized: 12/12-flu A and B and COVID-19 PCR nonreactive 12/12-MRSA PCR nonreactive 12/11-blood cultures with pansensitive Klebsiella pneumonia except to ampicillin 12/13-blood culture pending.  Previous antibiotics:  Ceftriaxone 12/12-12/14 Vancomycin 12/11 Cefepime 12/11 Flagyl 12/11 Acyclovir 12/11  Current antibiotics: Cefazolin 12/14 >>   Subjective:  Oriented only to self and feels that he is at the [facility].  Reports he feels "pretty good".  Denies complaints.  As per RN, no acute issues noted.  Objective:   Vitals:   09/09/20 1426 09/09/20 2051 09/10/20 0702 09/10/20 1531  BP: (!) 146/68 (!) 157/64 (!) 144/71 137/63  Pulse: 79 86 76 87  Resp: '17 18 17   ' Temp: 98.1 F (36.7 C) 98.1 F (36.7 C) 98 F (36.7 C) 98.6 F (37 C)  TempSrc: Oral   Oral  SpO2: 95% 96% 95% 96%  Weight:      Height:        General exam: Pleasant elderly male, moderately built and obese lying comfortably propped up in bed without distress. Respiratory system: Clear to auscultation. Respiratory effort normal. Cardiovascular system: S1 & S2 heard, RRR. No JVD, murmurs, rubs, gallops or clicks. No pedal edema.  Telemetry personally reviewed: Sinus rhythm. Gastrointestinal system: Abdomen is nondistended, soft and nontender. No organomegaly or masses felt. Normal bowel sounds heard. Central nervous system: Alert and oriented only to self. No focal neurological deficits. Extremities:  Upper extremities with grade 4 x 5 bilateral handgrip, at least 2 x 5 power proximally but unable to objectively evaluate due to lack of full cooperation from patient.  Likewise lower extremity at least grade 2 x 5 power, wiggles toes but unable to objectively  evaluate due to lack of full cooperation from patient.  T Skin: No rashes, lesions or ulcers Psychiatry: Judgement and insight impaired. Mood & affect flat.     Data Reviewed:   I have personally reviewed following labs and imaging studies   CBC: Recent Labs  Lab 09/06/20 1014 09/06/20 1048 09/07/20 0830 09/08/20 0118 09/10/20 0433  WBC 4.5  --  24.4* 17.7* 8.9  NEUTROABS 4.0  --  23.4*  --  7.4  HGB 15.8   < > 13.3 11.8* 10.3*  HCT 49.0   < > 42.7 37.3* 31.5*  MCV 93.0  --  95.3 94.0 92.1  PLT 214  --  166 137* 121*   < > = values in this interval not displayed.    Basic Metabolic Panel: Recent Labs  Lab 09/08/20 0118 09/09/20 0135 09/09/20 0839 09/10/20 0433  NA 141 144  --  143  K 4.3 3.4*  --  3.5  CL 102 106  --  106  CO2 23 24  --  27  GLUCOSE 104* 99  --  144*  BUN 34* 37*  --  26*  CREATININE 1.84* 1.53*  --  1.26*  CALCIUM 8.0* 8.1*  --  8.0*  MG  --   --  1.7 2.1  PHOS  --   --  2.3* 2.2*    Liver Function Tests: Recent Labs  Lab 09/07/20 0830 09/08/20 0118 09/10/20 0433  AST 49* 36 161*  ALT 24 22 81*  ALKPHOS 75 67 112  BILITOT 1.1 1.0 0.9  PROT 6.2* 5.7* 5.4*  ALBUMIN 2.5* 2.4* 1.9*    CBG: Recent Labs  Lab 09/10/20 0757 09/10/20 1140 09/10/20 1655  GLUCAP 157* 153* 136*    Microbiology Studies:   Recent Results (from the past 240 hour(s))  Culture, blood (routine x 2)     Status: Abnormal   Collection Time: 09/06/20 11:04 AM   Specimen: BLOOD RIGHT HAND  Result Value Ref Range Status   Specimen Description   Final    BLOOD RIGHT HAND BOTTLES DRAWN AEROBIC AND ANAEROBIC Performed at [facility], [address], [place], [place] [address]    Special Requests   Final    Blood Culture adequate volume Performed at [facility], [address], [place], [place] [address]    Culture  Setup Time   Final    GRAM NEGATIVE RODS Gram Stain Report Called to,Read Back By and Verified With: PRUITT,G @ 2330 ON 09/07/20 BY JUW GS DONE  @ [facility] CRITICAL RESULT CALLED TO, READ BACK BY AND VERIFIED WITH: PHARMD CATHY P. [phone] FCP Performed at [facility], [place] [address] [place], [place] [address]    Culture KLEBSIELLA PNEUMONIAE (A)  Final   Report Status 09/09/2020 FINAL  Final   Organism ID, Bacteria KLEBSIELLA PNEUMONIAE  Final      Susceptibility   Klebsiella pneumoniae - MIC*    AMPICILLIN RESISTANT Resistant     CEFAZOLIN <=4 SENSITIVE Sensitive  CEFEPIME <=0.12 SENSITIVE Sensitive     CEFTAZIDIME <=1 SENSITIVE Sensitive     CEFTRIAXONE <=0.25 SENSITIVE Sensitive     CIPROFLOXACIN <=0.25 SENSITIVE Sensitive     GENTAMICIN <=1 SENSITIVE Sensitive     IMIPENEM <=0.25 SENSITIVE Sensitive     TRIMETH/SULFA <=20 SENSITIVE Sensitive     AMPICILLIN/SULBACTAM 4 SENSITIVE Sensitive     PIP/TAZO <=4 SENSITIVE Sensitive     * KLEBSIELLA PNEUMONIAE  Blood Culture ID Panel (Reflexed)     Status: Abnormal   Collection Time: 09/06/20 11:04 AM  Result Value Ref Range Status   Enterococcus faecalis NOT DETECTED NOT DETECTED Final   Enterococcus Faecium NOT DETECTED NOT DETECTED Final   Listeria monocytogenes NOT DETECTED NOT DETECTED Final   Staphylococcus species NOT DETECTED NOT DETECTED Final   Staphylococcus aureus (BCID) NOT DETECTED NOT DETECTED Final   Staphylococcus epidermidis NOT DETECTED NOT DETECTED Final   Staphylococcus lugdunensis NOT DETECTED NOT DETECTED Final   Streptococcus species NOT DETECTED NOT DETECTED Final   Streptococcus agalactiae NOT DETECTED NOT DETECTED Final   Streptococcus pneumoniae NOT DETECTED NOT DETECTED Final   Streptococcus pyogenes NOT DETECTED NOT DETECTED Final   A.calcoaceticus-baumannii NOT DETECTED NOT DETECTED Final   Bacteroides fragilis NOT DETECTED NOT DETECTED Final   Enterobacterales DETECTED (A) NOT DETECTED Final    Comment: Enterobacterales represent a large order of gram negative bacteria, not a single organism. CRITICAL RESULT CALLED TO, READ BACK BY  AND VERIFIED WITH: PHARMD CATHY P. [id number] FCP    Enterobacter cloacae complex NOT DETECTED NOT DETECTED Final   Escherichia coli NOT DETECTED NOT DETECTED Final   Klebsiella aerogenes NOT DETECTED NOT DETECTED Final   Klebsiella oxytoca NOT DETECTED NOT DETECTED Final   Klebsiella pneumoniae DETECTED (A) NOT DETECTED Final    Comment: CRITICAL RESULT CALLED TO, READ BACK BY AND VERIFIED WITH: PHARMD CATHY P. [phone] FCP    Proteus species NOT DETECTED NOT DETECTED Final   Salmonella species NOT DETECTED NOT DETECTED Final   Serratia marcescens NOT DETECTED NOT DETECTED Final   Haemophilus influenzae NOT DETECTED NOT DETECTED Final   Neisseria meningitidis NOT DETECTED NOT DETECTED Final   Pseudomonas aeruginosa NOT DETECTED NOT DETECTED Final   Stenotrophomonas maltophilia NOT DETECTED NOT DETECTED Final   Candida albicans NOT DETECTED NOT DETECTED Final   Candida auris NOT DETECTED NOT DETECTED Final   Candida glabrata NOT DETECTED NOT DETECTED Final   Candida krusei NOT DETECTED NOT DETECTED Final   Candida parapsilosis NOT DETECTED NOT DETECTED Final   Candida tropicalis NOT DETECTED NOT DETECTED Final   Cryptococcus neoformans/gattii NOT DETECTED NOT DETECTED Final   CTX-M ESBL NOT DETECTED NOT DETECTED Final   Carbapenem resistance IMP NOT DETECTED NOT DETECTED Final   Carbapenem resistance KPC NOT DETECTED NOT DETECTED Final   Carbapenem resistance NDM NOT DETECTED NOT DETECTED Final   Carbapenem resist OXA 48 LIKE NOT DETECTED NOT DETECTED Final   Carbapenem resistance VIM NOT DETECTED NOT DETECTED Final    Comment: Performed at [facility], [address] [place], [place] [address]  Culture, blood (routine x 2)     Status: Abnormal   Collection Time: 09/06/20 11:11 AM   Specimen: Left Antecubital; Blood  Result Value Ref Range Status   Specimen Description   Final    LEFT ANTECUBITAL BOTTLES DRAWN AEROBIC AND ANAEROBIC Performed at [facility],  [address], [place], [place] [address]    Special Requests   Final  Blood Culture adequate volume Performed at [facility], [address], [place], [place] [address]    Culture  Setup Time   Final    GRAM NEGATIVE RODS Gram Stain Report Called to,Read Back By and Verified With: PRUITT,G @ 6283 ON 09/07/20 BY JUW GS DONE @ [facility]    Culture (A)  Final    KLEBSIELLA PNEUMONIAE SUSCEPTIBILITIES PERFORMED ON PREVIOUS CULTURE WITHIN THE LAST 5 DAYS. Performed at [facility], [place] [address] [place], [place] [address]    Report Status 09/09/2020 FINAL  Final  Resp Panel by RT-PCR (Flu A&B, Covid) Nasopharyngeal Swab     Status: None   Collection Time: 09/06/20 11:13 AM   Specimen: Nasopharyngeal Swab; Nasopharyngeal(NP) swabs in vial transport medium  Result Value Ref Range Status   SARS Coronavirus 2 by RT PCR NEGATIVE NEGATIVE Final    Comment: (NOTE) SARS-CoV-2 target nucleic acids are NOT DETECTED.  The SARS-CoV-2 RNA is generally detectable in upper respiratory specimens during the acute phase of infection. The lowest concentration of SARS-CoV-2 viral copies this assay can detect is 138 copies/mL. A negative result does not preclude SARS-Cov-2 infection and should not be used as the sole basis for treatment or other patient management decisions. A negative result may occur with  improper specimen collection/handling, submission of specimen other than nasopharyngeal swab, presence of viral mutation(s) within the areas targeted by this assay, and inadequate number of viral copies(<138 copies/mL). A negative result must be combined with clinical observations, patient history, and epidemiological information. The expected result is Negative.  Fact Sheet for Patients:  EntrepreneurPulse.com.au  Fact Sheet for Healthcare Providers:  IncredibleEmployment.be  This test is no t yet approved or cleared by the [place] FDA and  has been  authorized for detection and/or diagnosis of SARS-CoV-2 by FDA under an Emergency Use Authorization (EUA). This EUA will remain  in effect (meaning this test can be used) for the duration of the COVID-19 declaration under Section 564(b)(1) of the Act, 21 U.S.C.section 360bbb-3(b)(1), unless the authorization is terminated  or revoked sooner.       Influenza A by PCR NEGATIVE NEGATIVE Final   Influenza B by PCR NEGATIVE NEGATIVE Final    Comment: (NOTE) The Xpert Xpress SARS-CoV-2/FLU/RSV plus assay is intended as an aid in the diagnosis of influenza from Nasopharyngeal swab specimens and should not be used as a sole basis for treatment. Nasal washings and aspirates are unacceptable for Xpert Xpress SARS-CoV-2/FLU/RSV testing.  Fact Sheet for Patients: EntrepreneurPulse.com.au  Fact Sheet for Healthcare Providers: IncredibleEmployment.be  This test is not yet approved or cleared by the [place] FDA and has been authorized for detection and/or diagnosis of SARS-CoV-2 by FDA under an Emergency Use Authorization (EUA). This EUA will remain in effect (meaning this test can be used) for the duration of the COVID-19 declaration under Section 564(b)(1) of the Act, 21 U.S.C. section 360bbb-3(b)(1), unless the authorization is terminated or revoked.  Performed at [facility], [address] [place], [place] [address]   CSF culture     Status: None   Collection Time: 09/06/20  4:12 PM   Specimen: CSF; Cerebrospinal Fluid  Result Value Ref Range Status   Specimen Description   Final    CSF Performed at [facility], [address], [place], [place] [address]    Special Requests   Final    TUBE 1 Performed at [facility], [place] [address] [place], [place] [address]    Gram  Stain   Final    NO ORGANISMS SEEN Performed at [facility], [address], [place], [place] [address]    Culture   Final    NO GROWTH 3 DAYS Performed at [facility], [place] [address], [place], [place] [address]    Report Status 09/10/2020 FINAL  Final  Urine culture     Status: Abnormal   Collection Time: 09/06/20  6:25 PM   Specimen: Urine, Clean Catch  Result Value Ref Range Status   Specimen Description   Final    URINE, CLEAN CATCH Performed at [facility] [address] [place], [place] [address]    Special Requests   Final    NONE Performed at [facility], [address] [place], [place] [address]    Culture (A)  Final    <10,000 COLONIES/mL INSIGNIFICANT GROWTH Performed at [facility], [place] [address] [place], [place] [address]    Report Status 09/08/2020 FINAL  Final  MRSA PCR Screening     Status: None   Collection Time: 09/07/20  4:24 AM   Specimen: Nasopharyngeal  Result Value Ref Range Status   MRSA by PCR NEGATIVE NEGATIVE Final    Comment:        The GeneXpert MRSA Assay (FDA approved for NASAL specimens only), is one component of a comprehensive MRSA colonization surveillance program. It is not intended to diagnose MRSA infection nor to guide or monitor treatment for MRSA infections. Performed at [facility], [place] [address] [place], [place] [address]   Urine culture     Status: Abnormal   Collection Time: 09/07/20  9:04 AM   Specimen: Urine, Random  Result Value Ref Range Status   Specimen Description URINE, RANDOM  Final   Special Requests NONE  Final   Culture (A)  Final    <10,000 COLONIES/mL INSIGNIFICANT GROWTH Performed at [facility], [place] [address], [place], [place] [address]    Report Status 09/08/2020 FINAL  Final  Culture, blood (routine x 2)     Status: None (Preliminary result)   Collection Time: 09/08/20 11:57 AM   Specimen: BLOOD  Result Value Ref Range Status   Specimen Description BLOOD LEFT ANTECUBITAL  Final   Special Requests   Final    BOTTLES DRAWN AEROBIC AND ANAEROBIC Blood Culture adequate volume   Culture   Final    NO GROWTH 2 DAYS Performed at  [facility], [place] [address], [place], [place] [address]    Report Status PENDING  Incomplete  Culture, blood (routine x 2)     Status: None (Preliminary result)   Collection Time: 09/08/20 12:03 PM   Specimen: BLOOD  Result Value Ref Range Status   Specimen Description BLOOD RIGHT ANTECUBITAL  Final   Special Requests BOTTLES DRAWN AEROBIC ONLY  Final   Culture   Final    NO GROWTH 2 DAYS Performed at [facility], [place] [address], [place],  [address]    Report Status PENDING  Incomplete     Radiology Studies:  DG Retrograde Pyelogram  Result Date: 09/09/2020 CLINICAL DATA:  74 year old male undergoing retrograde ureteral stent placement. EXAM: RETROGRADE PYELOGRAM COMPARISON:  None. FINDINGS: Two fluoroscopic images submitted demonstrate right double-J nephroureteral stent in place with pigtail portions in the expected locations of the renal pelvis and bladder. No diagnostic quality iodinated contrast opacification is appreciated to evaluate the collecting system. IMPRESSION: Two fluoroscopic images submitted demonstrate right double-J  nephroureteral stent in place with pigtail portions in the expected locations of the renal pelvis and bladder. Ruthann Cancer, MD [facility] Electronically Signed   By: Ruthann Cancer MD   On: 09/09/2020 08:01   DG C-Arm 1-60 Min  Result Date: 09/08/2020 CLINICAL DATA:  Cystoscopy with retrograde pyelogram and ureteral stent placement. EXAM: DG C-ARM 1-60 MIN CONTRAST:  Not provided. FLUOROSCOPY TIME:  Fluoroscopy Time:  28.9 seconds Radiation Exposure Index (if provided by the fluoroscopic device): 11.16 mGy Number of Acquired Spot Images: 2 COMPARISON:  CT yesterday. FINDINGS: Two fluoroscopic spot views obtained of the abdomen in frontal projection. Ovoid density projecting over the mid right distal ureter corresponding to ureteral stone on CT. There is a right ureteral stent in  place. IMPRESSION: Intraoperative fluoroscopic spot views with right ureteral stone and stent in place. Electronically Signed   By: Keith Rake M.D.   On: 09/08/2020 18:30     Scheduled Meds:   . atorvastatin  5 mg Oral QHS  . cephALEXin  500 mg Oral Q6H  . chlorhexidine  15 mL Mouth Rinse BID  . Chlorhexidine Gluconate Cloth  6 each Topical Daily  . feeding supplement  237 mL Oral BID BM  . ferrous sulfate  325 mg Oral Q1200  . influenza vaccine adjuvanted  0.5 mL Intramuscular Tomorrow-1000  . insulin aspart  0-15 Units Subcutaneous TID WC  . insulin aspart  0-5 Units Subcutaneous QHS  . mouth rinse  15 mL Mouth Rinse q12n4p  . multivitamin with minerals  1 tablet Oral Daily    Continuous Infusions:   . sodium chloride 250 mL (09/07/20 1150)     LOS: 4 days     Vernell Leep, MD, [place], [facility]. [facility]    To contact the attending provider between 7A-7P or the covering provider during after hours 7P-7A, please log into the web site [email or web address] and access using universal [place] password for that web site. If you do not have the password, please call the hospital operator.  09/10/2020, 5:30 PM

## 2020-09-10 NOTE — Progress Notes (Addendum)
Nutrition Follow-up  DOCUMENTATION CODES:   Not applicable  INTERVENTION:   -Continue MVI with minerals daily -Continue Ensure Enlive po BID, each supplement provides 350 kcal and 20 grams of protein -Feeding assistance with meals  NUTRITION DIAGNOSIS:   Increased nutrient needs related to wound healing as evidenced by estimated needs.  Ongoing  GOAL:   Patient will meet greater than or equal to 90% of their needs  Progressing   MONITOR:   Supplement acceptance,Weight trends,Diet advancement,PO intake,Labs,I & O's,Skin  REASON FOR ASSESSMENT:   Malnutrition Screening Tool    ASSESSMENT:   Patient with PMH significant for dementia, HTN, HLD, colon cancer s/p resection, and GIB. Presents this admission with sepsis 2/2 pyelonephritis and R ureteral stone.  12/13- s/p Procedure: 1.  Cystoscopy with right retrograde pyelogram and interpretation. 2.  Cystoscopy with right ureteral stent insertion. 3.  Application of fluoroscopy. 12/14- s/p BSE- advanced to dysphagia 1 diet with thin liquids  Reviewed I/O's: +170 ml x 24 hours and +7.5 L since admission  UOP: 950 ml x 24 hours  Pt receiving nursing care at time of visit.   Per nurse tech, she is assisting with feeding pt. Noted meal completions 75-100%.   Per ID notes, likely transition to oral antibiotics soon.    Labs reviewed: CBGS: 100-138 (inpatient orders for glycemic control are 0-15 units insulin aspart TID with meals and 0-5 units insulin aspart daily at bedtime).   Diet Order:   Diet Order            DIET - DYS 1 Room service appropriate? Yes; Fluid consistency: Thin  Diet effective now                 EDUCATION NEEDS:   Not appropriate for education at this time  Skin:  Skin Assessment: Skin Integrity Issues: Skin Integrity Issues:: Incisions,Stage II Stage II: buttocks Incisions: closed perineum  Last BM:  09/08/20  Height:   Ht Readings from Last 1 Encounters:  09/08/20 5' 9.02"  (1.753 m)    Weight:   Wt Readings from Last 1 Encounters:  09/08/20 107 kg    Ideal Body Weight:  72.7 kg  BMI:  Body mass index is 34.82 kg/m.  Estimated Nutritional Needs:   Kcal:  2300-2500 kcal  Protein:  115-130 grams  Fluid:  >/= 2 L/day    Loistine Chance, RD, LDN, [place] Registered Dietitian II Certified Diabetes Care and Education Specialist Please refer to [facility] for RD and/or RD on-call/weekend/after hours pager

## 2020-09-10 NOTE — H&P (View-Only) (Signed)
2 Days Post-Op  Subjective: Patient sleeping.  The foley remains in place.  Urine is clear.   ROS:  Review of Systems  Unable to perform ROS: Mental acuity    Anti-infectives: Anti-infectives (From admission, onward)   Start     Dose/Rate Route Frequency Ordered Stop   09/09/20 1400  ceFAZolin (ANCEF) IVPB 2g/100 mL premix        2 g 200 mL/hr over 30 Minutes Intravenous Every 8 hours 09/09/20 1125     09/07/20 1200  vancomycin (VANCOREADY) IVPB 1250 mg/250 mL  Status:  Discontinued       "Followed by" Linked Group Details   1,250 mg 166.7 mL/hr over 90 Minutes Intravenous Every 24 hours 09/06/20 1100 09/07/20 1044   09/07/20 1200  cefTRIAXone (ROCEPHIN) 2 g in sodium chloride 0.9 % 100 mL IVPB  Status:  Discontinued        2 g 200 mL/hr over 30 Minutes Intravenous Every 24 hours 09/07/20 1044 09/09/20 1125   09/06/20 2200  ceFEPIme (MAXIPIME) 2 g in sodium chloride 0.9 % 100 mL IVPB  Status:  Discontinued        2 g 200 mL/hr over 30 Minutes Intravenous Every 12 hours 09/06/20 1100 09/07/20 1044   09/06/20 2130  acyclovir (ZOVIRAX) 850 mg in dextrose 5 % 150 mL IVPB  Status:  Discontinued        10 mg/kg  85.2 kg (Adjusted) 167 mL/hr over 60 Minutes Intravenous Every 8 hours 09/06/20 2042 09/07/20 1033   09/06/20 1200  vancomycin (VANCOREADY) IVPB 2000 mg/400 mL       "Followed by" Linked Group Details   2,000 mg 200 mL/hr over 120 Minutes Intravenous  Once 09/06/20 1100 09/06/20 1355   09/06/20 1100  ceFEPIme (MAXIPIME) 2 g in sodium chloride 0.9 % 100 mL IVPB        2 g 200 mL/hr over 30 Minutes Intravenous  Once 09/06/20 1047 09/06/20 1150   09/06/20 1100  metroNIDAZOLE (FLAGYL) IVPB 500 mg        500 mg 100 mL/hr over 60 Minutes Intravenous  Once 09/06/20 1047 09/06/20 1231   09/06/20 1100  vancomycin (VANCOCIN) IVPB 1000 mg/200 mL premix  Status:  Discontinued        1,000 mg 200 mL/hr over 60 Minutes Intravenous  Once 09/06/20 1047 09/06/20 1100      Current  Facility-Administered Medications  Medication Dose Route Frequency Provider Last Rate Last Admin  . 0.9 %  sodium chloride infusion  250 mL Intravenous Continuous Irine Seal, MD 10 mL/hr at 09/07/20 1150 250 mL at 09/07/20 1150  . acetaminophen (TYLENOL) tablet 650 mg  650 mg Oral Q6H PRN Irine Seal, MD       Or  . acetaminophen (TYLENOL) suppository 650 mg  650 mg Rectal Q6H PRN Irine Seal, MD      . atorvastatin (LIPITOR) tablet 5 mg  5 mg Oral QHS Irine Seal, MD   5 mg at 09/09/20 2119  . ceFAZolin (ANCEF) IVPB 2g/100 mL premix  2 g Intravenous Q8H Mignon Pine, DO 200 mL/hr at 09/10/20 0612 2 g at 09/10/20 0612  . chlorhexidine (PERIDEX) 0.12 % solution 15 mL  15 mL Mouth Rinse BID Irine Seal, MD   15 mL at 09/09/20 2117  . Chlorhexidine Gluconate Cloth 2 % PADS 6 each  6 each Topical Daily Irine Seal, MD   6 each at 09/09/20 1010  . dextrose 5% in lactated ringers with KCl 20  mEq/L infusion   Intravenous Continuous Mercy Riding, MD 75 mL/hr at 09/10/20 0611 New Bag at 09/10/20 3382  . feeding supplement (ENSURE ENLIVE / ENSURE PLUS) liquid 237 mL  237 mL Oral BID BM Irine Seal, MD   237 mL at 09/09/20 1514  . ferrous sulfate tablet 325 mg  325 mg Oral Q1200 Irine Seal, MD   325 mg at 09/09/20 1515  . influenza vaccine adjuvanted (FLUAD) injection 0.5 mL  0.5 mL Intramuscular Tomorrow-1000 Collier Bullock, MD      . insulin aspart (novoLOG) injection 0-15 Units  0-15 Units Subcutaneous TID WC Irine Seal, MD   2 Units at 09/09/20 1756  . insulin aspart (novoLOG) injection 0-5 Units  0-5 Units Subcutaneous QHS Irine Seal, MD      . MEDLINE mouth rinse  15 mL Mouth Rinse q12n4p Irine Seal, MD   15 mL at 09/09/20 1615  . multivitamin with minerals tablet 1 tablet  1 tablet Oral Daily Irine Seal, MD   1 tablet at 09/09/20 1010  . ondansetron (ZOFRAN) tablet 4 mg  4 mg Oral Q6H PRN Irine Seal, MD       Or  . ondansetron [facility]) injection 4 mg  4 mg Intravenous Q6H PRN Irine Seal, MD      . oxyCODONE (Oxy IR/ROXICODONE) immediate release tablet 5 mg  5 mg Oral Q4H PRN Irine Seal, MD      . sodium chloride flush (NS) 0.9 % injection 10-40 mL  10-40 mL Intracatheter PRN Jacky Kindle, MD         Objective: Vital signs in last 24 hours: Temp:  [98 F (36.7 C)-98.1 F (36.7 C)] 98 F (36.7 C) (12/15 0702) Pulse Rate:  [76-86] 76 (12/15 0702) Resp:  [17-18] 17 (12/15 0702) BP: (144-157)/(64-71) 144/71 (12/15 0702) SpO2:  [95 %-96 %] 95 % (12/15 0702)  Intake/Output from previous day: 12/14 0701 - 12/15 0700 In: 1120 [P.O.:100; I.V.:920; IV Piggyback:100] Out: 950 [Urine:950] Intake/Output this shift: No intake/output data recorded.   Physical Exam Vitals reviewed.  Constitutional:      Appearance: Normal appearance.  Neurological:     Mental Status: He is alert.     Lab Results:  Recent Labs    09/08/20 0118 09/10/20 0433  WBC 17.7* 8.9  HGB 11.8* 10.3*  HCT 37.3* 31.5*  PLT 137* 121*   BMET Recent Labs    09/09/20 0135 09/10/20 0433  NA 144 143  K 3.4* 3.5  CL 106 106  CO2 24 27  GLUCOSE 99 144*  BUN 37* 26*  CREATININE 1.53* 1.26*  CALCIUM 8.1* 8.0*   PT/INR No results for input(s): LABPROT, INR in the last 72 hours. ABG No results for input(s): PHART, HCO3 in the last 72 hours.  Invalid input(s): PCO2, PO2  Studies/Results: DG Retrograde Pyelogram  Result Date: 09/09/2020 CLINICAL DATA:  74 year old male undergoing retrograde ureteral stent placement. EXAM: RETROGRADE PYELOGRAM COMPARISON:  None. FINDINGS: Two fluoroscopic images submitted demonstrate right double-J nephroureteral stent in place with pigtail portions in the expected locations of the renal pelvis and bladder. No diagnostic quality iodinated contrast opacification is appreciated to evaluate the collecting system. IMPRESSION: Two fluoroscopic images submitted demonstrate right double-J nephroureteral stent in place with pigtail portions in the expected  locations of the renal pelvis and bladder. Ruthann Cancer, MD [facility] Radiology Electronically Signed   By: Ruthann Cancer MD   On: 09/09/2020 08:01   DG C-Arm 1-60  Min  Result Date: 09/08/2020 CLINICAL DATA:  Cystoscopy with retrograde pyelogram and ureteral stent placement. EXAM: DG C-ARM 1-60 MIN CONTRAST:  Not provided. FLUOROSCOPY TIME:  Fluoroscopy Time:  28.9 seconds Radiation Exposure Index (if provided by the fluoroscopic device): 11.16 mGy Number of Acquired Spot Images: 2 COMPARISON:  CT yesterday. FINDINGS: Two fluoroscopic spot views obtained of the abdomen in frontal projection. Ovoid density projecting over the mid right distal ureter corresponding to ureteral stone on CT. There is a right ureteral stent in place. IMPRESSION: Intraoperative fluoroscopic spot views with right ureteral stone and stent in place. Electronically Signed   By: Keith Rake M.D.   On: 09/08/2020 18:30     Assessment and Plan: Right ureteral stone with sepsis doing well s/p stenting.   HE will be scheduled for right ureteroscopic stone extraction in the near future.       LOS: 4 days    Irine Seal 09/10/2020 [phone] ID: Lowell Bouton, male   DOB: [birth year], 74 y.o.   MRN: [id number]

## 2020-09-10 NOTE — Consult Note (Signed)
Consultation Note Date: 09/10/2020   Patient Name: [name]  DOB: 1946/06/25  MRN: [id number]  Age / Sex: 74 y.o., male  PCP: Caprice Renshaw, MD Referring Physician: Modena Jansky, MD  Reason for Consultation: Establishing goals of care  HPI/Patient Profile: 74 y.o. male  with past medical history of dementia, hypertension, hyperlipidemia, Bells's Palsy, and colon cancer stage 3. He initially presented to [facility] emergency department on 09/06/2020 with altered mental status. In the ED, urinalysis was suspicious for UTI, lactic acid was 9.6, and he was started on levophed due to refractory hypotension. He was transferred to [facility] and admitted to ICU for further management of septic shock. Blood cultures positive for Klebsiella pneumonia. Urine culture with insignificant growth. CSF culture negative. On 12/13, patient underwent cystoscopy with right urethral stent placement.   On initial presentation, there was concern about stroke. Neurology was consulted and recommended MRI with and without contrast to exclude metastasis to the brain. MFR brain without contrast reported as small foci or restricted diffusion involving the left pons and medulla which could be acute/subacute insults or artifacts.   Clinical Assessment and Goals of Care: I have reviewed medical records including EPIC notes, labs and imaging, received report from nursing, and examined the patient.  Patient states he feels "pretty good". He is oriented to self; does not know why he is in the hospital. He shares with me that he has lived in the nursing facility for "a long time", ever since his parents died many years ago. I inquired if he had any other family - he states he had 2 brother and 2 sisters who are all deceased.   Patient is non-ambulatory at baseline. He reports he enjoys watching movies at the nursing facility, and that this is what "keeps him  company".   Patient is currently documented as full code status. I attempted to discuss the issue of code status with Mr. Kissinger. Emphasized evidenced based poor outcomes in similar hospitalized patients, as the cause of the arrest is likely associated with chronic/terminal disease rather than a reversible acute cardio-pulmonary event. Patient states "I have a lawyer that handles all of that".   Patient is not aware of his current medical situation and clearly does not have capacity for complex medical decision making.   I attempted to call legal guardian [name] to discuss diagnosis, prognosis, [place], EOL wishes, disposition, and options. No answer x 2 attempts. I left a message and requested a return call.    Primary decision maker: [name] (legal guardian) [phone] ext [phone]   SUMMARY OF RECOMMENDATIONS   - full code, full scope for now - continue current medical treatment - PMT will make further attempts to reach legal guardian tomorrow - patient may be eligible for hospice care at his [place]:  Full code  Symptom Management:   Per primary team  Palliative Prophylaxis:   Turn/reposition, oral care, aspiration  Additional Recommendations (Limitations, Scope, Preferences):  To be determined  Psycho-social/Spiritual:  Emotional support provided  Prognosis:   Less than 6 months would not be surprising  Discharge Planning: SNF with outpatient palliative versus hospice (pending discussion with legal guardian)      Primary Diagnoses: Present on Admission: . Bacteremia due to Klebsiella pneumoniae . AKI (acute kidney injury) ([place]) . Lactic acidosis . Essential hypertension . Mild protein-calorie malnutrition ([place]) . Septic shock ([place])   I have reviewed the medical record, interviewed the patient and family, and examined the patient. The following aspects are pertinent.  Past Medical History:  Diagnosis Date  .  Bell's palsy   . Bleeding ulcer    remote past  . Dementia    disoriented to time.does not know medical hx.information obtained from previous charts  . Hard of hearing   . Hyperlipemia   . Hypertension   . Obesity   . SOB (shortness of breath) on exertion    Social History   Socioeconomic History  . Marital status: Single    Spouse name: Not on file  . Number of children: Not on file  . Years of education: Not on file  . Highest education level: Not on file  Occupational History  . Not on file  Tobacco Use  . Smoking status: Former Smoker    Packs/day: 1.00    Years: 30.00    Pack years: 30.00    Types: Cigarettes  . Smokeless tobacco: Never Used  . Tobacco comment: quit a "long time ago"   Vaping Use  . Vaping Use: Never used  Substance and Sexual Activity  . Alcohol use: No  . Drug use: No  . Sexual activity: Never  Other Topics Concern  . Not on file  Social History Narrative  . Not on file    Family History  Problem Relation Age of Onset  . COPD Mother   . Cancer Brother   . Anesthesia problems Neg Hx   . Hypotension Neg Hx   . Malignant hyperthermia Neg Hx   . Pseudochol deficiency Neg Hx   . Colon cancer Neg Hx    Scheduled Meds: . atorvastatin  5 mg Oral QHS  . cephALEXin  500 mg Oral Q6H  . chlorhexidine  15 mL Mouth Rinse BID  . Chlorhexidine Gluconate Cloth  6 each Topical Daily  . feeding supplement  237 mL Oral BID BM  . ferrous sulfate  325 mg Oral Q1200  . influenza vaccine adjuvanted  0.5 mL Intramuscular Tomorrow-1000  . insulin aspart  0-15 Units Subcutaneous TID WC  . insulin aspart  0-5 Units Subcutaneous QHS  . mouth rinse  15 mL Mouth Rinse q12n4p  . multivitamin with minerals  1 tablet Oral Daily   Continuous Infusions: . sodium chloride 250 mL (09/07/20 1150)  . dextrose 5% lactated ringers with KCl 20 mEq/L 75 mL/hr at 09/10/20 0611   PRN Meds:.acetaminophen **OR** acetaminophen, ondansetron **OR** ondansetron (ZOFRAN) IV,  oxyCODONE, sodium chloride flush Medications Prior to Admission:  Prior to Admission medications   Medication Sig Start Date End Date Taking? Authorizing Provider  acetaminophen (TYLENOL) 650 MG CR tablet Take 650 mg by mouth 3 (three) times daily.   Yes [provider]  atenolol (TENORMIN) 25 MG tablet Take 25 mg by mouth daily.    Yes [provider]  atorvastatin (LIPITOR) 10 MG tablet Take 5 mg by mouth at bedtime.    Yes [provider]  carboxymethylcellulose (REFRESH PLUS) 0.5 % SOLN Place 1 drop into both eyes every 6 (six)  hours as needed (Dry eye).    Yes [provider]  cholecalciferol (VITAMIN D3) 25 MCG (1000 UNIT) tablet Take 1,000 Units by mouth daily.   Yes [provider]  Colloidal Oatmeal (EUCERIN ECZEMA RELIEF) 1 % CREA Apply 1 application topically 2 (two) times daily. Apply to face and neck   Yes [provider]  cycloSPORINE (RESTASIS) 0.05 % ophthalmic emulsion Place 1 drop into both eyes 2 (two) times daily.   Yes [provider]  ferrous sulfate 325 (65 FE) MG EC tablet Take 325 mg by mouth daily at 12 noon.   Yes [provider]  furosemide (LASIX) 40 MG tablet Take 40 mg by mouth daily.   Yes [provider]  ketoconazole (NIZORAL) 2 % shampoo Apply 1 application topically every 12 (twelve) hours as needed for irritation. Apply to Scalp   Yes [provider]  metFORMIN (GLUCOPHAGE) 500 MG tablet Take 500 mg by mouth at bedtime.   Yes [provider]  mometasone (ELOCON) 0.1 % cream Apply 1 application topically daily as needed (Dry skin).    Yes [provider]  Multiple Vitamin (MULTIVITAMIN) tablet Take 1 tablet by mouth daily.   Yes [provider]  Omega-3 Fatty Acids (FISH OIL) 1000 MG CAPS Take 2,000 mg by mouth 2 (two) times daily.    Yes [provider]  potassium chloride (KLOR-CON) 20 MEQ packet Take 20 mEq by mouth 2 (two) times daily.     Yes [provider]  Skin Protectants, Misc. (EUCERIN) cream Apply 1 application topically daily. Apply to feet   Yes [provider]  vitamin B-12 (CYANOCOBALAMIN) 1000 MCG tablet Take 1,000 mcg by mouth daily.   Yes [provider]  linaclotide (LINZESS) 290 MCG CAPS capsule Take 1 capsule (290 mcg total) by mouth daily before breakfast. Start 5 days prior to bowel prep Patient not taking: No sig reported 08/26/20   Rourk, Cristopher Estimable, MD  polyethylene glycol-electrolytes (NULYTELY) 420 g solution As directed Patient not taking: No sig reported 08/26/20   Rourk, Cristopher Estimable, MD   Allergies  Allergen Reactions  . Hydrocodone   . Hydrocodone-Acetaminophen     unknown  . Aplisol [Tuberculin Ppd] Rash   Review of Systems  All other systems reviewed and are negative.   Physical Exam Vitals reviewed.  Constitutional:      General: He is not in acute distress.    Appearance: He is obese.  HENT:     Head: Normocephalic and atraumatic.  Cardiovascular:     Rate and Rhythm: Normal rate and regular rhythm.  Pulmonary:     Effort: Pulmonary effort is normal.  Neurological:     Mental Status: He is alert.     Motor: Weakness present.     Comments: Oriented to self  Psychiatric:        Cognition and Memory: Cognition is impaired.     Vital Signs: BP (!) 144/71 (BP Location: Right Arm)   Pulse 76   Temp 98 F (36.7 C)   Resp 17   Ht 5' 9.02" (1.753 m)   Wt 107 kg   SpO2 95%   BMI 34.82 kg/m  Pain Scale: 0-10 POSS *See Group Information*: S-Acceptable,Sleep, easy to arouse Pain Score: Asleep   SpO2: SpO2: 95 % O2 Device:SpO2: 95 % O2 Flow Rate: .O2 Flow Rate (L/min): 1 L/min  IO: Intake/output summary:   Intake/Output Summary (Last 24 hours) at 09/10/2020 1044 Last data filed at  09/10/2020 0730 Gross per 24 hour  Intake 1474 ml  Output 725 ml  Net 749 ml    LBM: Last BM Date: 09/08/20 Baseline Weight: Weight: 107 kg Most recent weight:  Weight: 107 kg      Palliative Assessment/Data: PPS 30%     Time In: 13:10 Time Out: 13:45 Time Total: 35 minutes Greater than 50%  of this time was spent counseling and coordinating care related to the above assessment and plan.  Signed by: Lavena Bullion, NP   Please contact Palliative Medicine Team phone at [phone] for questions and concerns.  For individual provider: See Shea Evans

## 2020-09-10 NOTE — Progress Notes (Signed)
2 Days Post-Op  Subjective: Patient sleeping.  The foley remains in place.  Urine is clear.   ROS:  Review of Systems  Unable to perform ROS: Mental acuity    Anti-infectives: Anti-infectives (From admission, onward)   Start     Dose/Rate Route Frequency Ordered Stop   09/09/20 1400  ceFAZolin (ANCEF) IVPB 2g/100 mL premix        2 g 200 mL/hr over 30 Minutes Intravenous Every 8 hours 09/09/20 1125     09/07/20 1200  vancomycin (VANCOREADY) IVPB 1250 mg/250 mL  Status:  Discontinued       "Followed by" Linked Group Details   1,250 mg 166.7 mL/hr over 90 Minutes Intravenous Every 24 hours 09/06/20 1100 09/07/20 1044   09/07/20 1200  cefTRIAXone (ROCEPHIN) 2 g in sodium chloride 0.9 % 100 mL IVPB  Status:  Discontinued        2 g 200 mL/hr over 30 Minutes Intravenous Every 24 hours 09/07/20 1044 09/09/20 1125   09/06/20 2200  ceFEPIme (MAXIPIME) 2 g in sodium chloride 0.9 % 100 mL IVPB  Status:  Discontinued        2 g 200 mL/hr over 30 Minutes Intravenous Every 12 hours 09/06/20 1100 09/07/20 1044   09/06/20 2130  acyclovir (ZOVIRAX) 850 mg in dextrose 5 % 150 mL IVPB  Status:  Discontinued        10 mg/kg  85.2 kg (Adjusted) 167 mL/hr over 60 Minutes Intravenous Every 8 hours 09/06/20 2042 09/07/20 1033   09/06/20 1200  vancomycin (VANCOREADY) IVPB 2000 mg/400 mL       "Followed by" Linked Group Details   2,000 mg 200 mL/hr over 120 Minutes Intravenous  Once 09/06/20 1100 09/06/20 1355   09/06/20 1100  ceFEPIme (MAXIPIME) 2 g in sodium chloride 0.9 % 100 mL IVPB        2 g 200 mL/hr over 30 Minutes Intravenous  Once 09/06/20 1047 09/06/20 1150   09/06/20 1100  metroNIDAZOLE (FLAGYL) IVPB 500 mg        500 mg 100 mL/hr over 60 Minutes Intravenous  Once 09/06/20 1047 09/06/20 1231   09/06/20 1100  vancomycin (VANCOCIN) IVPB 1000 mg/200 mL premix  Status:  Discontinued        1,000 mg 200 mL/hr over 60 Minutes Intravenous  Once 09/06/20 1047 09/06/20 1100      Current  Facility-Administered Medications  Medication Dose Route Frequency Provider Last Rate Last Admin  . 0.9 %  sodium chloride infusion  250 mL Intravenous Continuous Irine Seal, MD 10 mL/hr at 09/07/20 1150 250 mL at 09/07/20 1150  . acetaminophen (TYLENOL) tablet 650 mg  650 mg Oral Q6H PRN Irine Seal, MD       Or  . acetaminophen (TYLENOL) suppository 650 mg  650 mg Rectal Q6H PRN Irine Seal, MD      . atorvastatin (LIPITOR) tablet 5 mg  5 mg Oral QHS Irine Seal, MD   5 mg at 09/09/20 2119  . ceFAZolin (ANCEF) IVPB 2g/100 mL premix  2 g Intravenous Q8H Mignon Pine, DO 200 mL/hr at 09/10/20 0612 2 g at 09/10/20 0612  . chlorhexidine (PERIDEX) 0.12 % solution 15 mL  15 mL Mouth Rinse BID Irine Seal, MD   15 mL at 09/09/20 2117  . Chlorhexidine Gluconate Cloth 2 % PADS 6 each  6 each Topical Daily Irine Seal, MD   6 each at 09/09/20 1010  . dextrose 5% in lactated ringers with KCl 20  mEq/L infusion   Intravenous Continuous Mercy Riding, MD 75 mL/hr at 09/10/20 0611 New Bag at 09/10/20 3335  . feeding supplement (ENSURE ENLIVE / ENSURE PLUS) liquid 237 mL  237 mL Oral BID BM Irine Seal, MD   237 mL at 09/09/20 1514  . ferrous sulfate tablet 325 mg  325 mg Oral Q1200 Irine Seal, MD   325 mg at 09/09/20 1515  . influenza vaccine adjuvanted (FLUAD) injection 0.5 mL  0.5 mL Intramuscular Tomorrow-1000 Collier Bullock, MD      . insulin aspart (novoLOG) injection 0-15 Units  0-15 Units Subcutaneous TID WC Irine Seal, MD   2 Units at 09/09/20 1756  . insulin aspart (novoLOG) injection 0-5 Units  0-5 Units Subcutaneous QHS Irine Seal, MD      . MEDLINE mouth rinse  15 mL Mouth Rinse q12n4p Irine Seal, MD   15 mL at 09/09/20 1615  . multivitamin with minerals tablet 1 tablet  1 tablet Oral Daily Irine Seal, MD   1 tablet at 09/09/20 1010  . ondansetron (ZOFRAN) tablet 4 mg  4 mg Oral Q6H PRN Irine Seal, MD       Or  . ondansetron [facility]) injection 4 mg  4 mg Intravenous Q6H PRN Irine Seal, MD      . oxyCODONE (Oxy IR/ROXICODONE) immediate release tablet 5 mg  5 mg Oral Q4H PRN Irine Seal, MD      . sodium chloride flush (NS) 0.9 % injection 10-40 mL  10-40 mL Intracatheter PRN Jacky Kindle, MD         Objective: Vital signs in last 24 hours: Temp:  [98 F (36.7 C)-98.1 F (36.7 C)] 98 F (36.7 C) (12/15 0702) Pulse Rate:  [76-86] 76 (12/15 0702) Resp:  [17-18] 17 (12/15 0702) BP: (144-157)/(64-71) 144/71 (12/15 0702) SpO2:  [95 %-96 %] 95 % (12/15 0702)  Intake/Output from previous day: 12/14 0701 - 12/15 0700 In: 1120 [P.O.:100; I.V.:920; IV Piggyback:100] Out: 950 [Urine:950] Intake/Output this shift: No intake/output data recorded.   Physical Exam Vitals reviewed.  Constitutional:      Appearance: Normal appearance.  Neurological:     Mental Status: He is alert.     Lab Results:  Recent Labs    09/08/20 0118 09/10/20 0433  WBC 17.7* 8.9  HGB 11.8* 10.3*  HCT 37.3* 31.5*  PLT 137* 121*   BMET Recent Labs    09/09/20 0135 09/10/20 0433  NA 144 143  K 3.4* 3.5  CL 106 106  CO2 24 27  GLUCOSE 99 144*  BUN 37* 26*  CREATININE 1.53* 1.26*  CALCIUM 8.1* 8.0*   PT/INR No results for input(s): LABPROT, INR in the last 72 hours. ABG No results for input(s): PHART, HCO3 in the last 72 hours.  Invalid input(s): PCO2, PO2  Studies/Results: DG Retrograde Pyelogram  Result Date: 09/09/2020 CLINICAL DATA:  74 year old male undergoing retrograde ureteral stent placement. EXAM: RETROGRADE PYELOGRAM COMPARISON:  None. FINDINGS: Two fluoroscopic images submitted demonstrate right double-J nephroureteral stent in place with pigtail portions in the expected locations of the renal pelvis and bladder. No diagnostic quality iodinated contrast opacification is appreciated to evaluate the collecting system. IMPRESSION: Two fluoroscopic images submitted demonstrate right double-J nephroureteral stent in place with pigtail portions in the expected  locations of the renal pelvis and bladder. Ruthann Cancer, MD [facility] Electronically Signed   By: Ruthann Cancer MD   On: 09/09/2020 08:01   DG C-Arm 1-60  Min  Result Date: 09/08/2020 CLINICAL DATA:  Cystoscopy with retrograde pyelogram and ureteral stent placement. EXAM: DG C-ARM 1-60 MIN CONTRAST:  Not provided. FLUOROSCOPY TIME:  Fluoroscopy Time:  28.9 seconds Radiation Exposure Index (if provided by the fluoroscopic device): 11.16 mGy Number of Acquired Spot Images: 2 COMPARISON:  CT yesterday. FINDINGS: Two fluoroscopic spot views obtained of the abdomen in frontal projection. Ovoid density projecting over the mid right distal ureter corresponding to ureteral stone on CT. There is a right ureteral stent in place. IMPRESSION: Intraoperative fluoroscopic spot views with right ureteral stone and stent in place. Electronically Signed   By: Keith Rake M.D.   On: 09/08/2020 18:30     Assessment and Plan: Right ureteral stone with sepsis doing well s/p stenting.   HE will be scheduled for right ureteroscopic stone extraction in the near future.       LOS: 4 days    Irine Seal 09/10/2020 [phone] ID: [name], male   DOB: [birth year], 74 y.o.   MRN: [id number]

## 2020-09-10 NOTE — Care Management Important Message (Signed)
Important Message  Patient Details  Name: [name] MRN: [id number] Date of Birth: Oct 04, 1945   Medicare Important Message Given:  Yes  Tried to call room to remind of IM docurment  No answer will mail copy of document to home address     Orbie Pyo 09/10/2020, 3:23 PM

## 2020-09-11 ENCOUNTER — Inpatient Hospital Stay (HOSPITAL_COMMUNITY): Payer: Medicare (Managed Care)

## 2020-09-11 DIAGNOSIS — R7989 Other specified abnormal findings of blood chemistry: Secondary | ICD-10-CM

## 2020-09-11 DIAGNOSIS — R509 Fever, unspecified: Secondary | ICD-10-CM

## 2020-09-11 DIAGNOSIS — R945 Abnormal results of liver function studies: Secondary | ICD-10-CM

## 2020-09-11 LAB — COMPREHENSIVE METABOLIC PANEL
ALT: 103 U/L — ABNORMAL HIGH (ref 0–44)
AST: 212 U/L — ABNORMAL HIGH (ref 15–41)
Albumin: 1.8 g/dL — ABNORMAL LOW (ref 3.5–5.0)
Alkaline Phosphatase: 141 U/L — ABNORMAL HIGH (ref 38–126)
Anion gap: 9 (ref 5–15)
BUN: 18 mg/dL (ref 8–23)
CO2: 28 mmol/L (ref 22–32)
Calcium: 8 mg/dL — ABNORMAL LOW (ref 8.9–10.3)
Chloride: 104 mmol/L (ref 98–111)
Creatinine, Ser: 1.06 mg/dL (ref 0.61–1.24)
GFR, Estimated: >60 mL/min (ref >60)
Glucose, Bld: 132 mg/dL — ABNORMAL HIGH (ref 70–99)
Potassium: 3.6 mmol/L (ref 3.5–5.1)
Sodium: 141 mmol/L (ref 135–145)
Total Bilirubin: 0.5 mg/dL (ref 0.3–1.2)
Total Protein: 5.3 g/dL — ABNORMAL LOW (ref 6.5–8.1)

## 2020-09-11 LAB — GLUCOSE, CAPILLARY
Glucose-Capillary: 118 mg/dL — ABNORMAL HIGH (ref 70–99)
Glucose-Capillary: 134 mg/dL — ABNORMAL HIGH (ref 70–99)
Glucose-Capillary: 126 mg/dL — ABNORMAL HIGH (ref 70–99)
Glucose-Capillary: 147 mg/dL — ABNORMAL HIGH (ref 70–99)

## 2020-09-11 LAB — CBC
HCT: 31.3 % — ABNORMAL LOW (ref 39.0–52.0)
Hemoglobin: 10.2 g/dL — ABNORMAL LOW (ref 13.0–17.0)
MCH: 30.1 pg (ref 26.0–34.0)
MCHC: 32.6 g/dL (ref 30.0–36.0)
MCV: 92.3 fL (ref 80.0–100.0)
Platelets: 124 10*3/uL — ABNORMAL LOW (ref 150–400)
RBC: 3.39 MIL/uL — ABNORMAL LOW (ref 4.22–5.81)
RDW: 13.4 % (ref 11.5–15.5)
WBC: 7.8 10*3/uL (ref 4.0–10.5)
nRBC: 0 % (ref 0.0–0.2)

## 2020-09-11 LAB — RESP PANEL BY RT-PCR (FLU A&B, COVID) ARPGX2
Influenza A by PCR: NEGATIVE
Influenza B by PCR: NEGATIVE
SARS Coronavirus 2 by RT PCR: NEGATIVE

## 2020-09-11 LAB — LDL CHOLESTEROL, DIRECT: Direct LDL: 25.1 mg/dL (ref 0–99)

## 2020-09-11 LAB — PHOSPHORUS: Phosphorus: 3 mg/dL (ref 2.5–4.6)

## 2020-09-11 MED ORDER — IOHEXOL 350 MG/ML SOLN
75.0000 mL | Freq: Once | INTRAVENOUS | Status: AC | PRN
  Administered 2020-09-11: 15:00:00 75 mL via INTRAVENOUS

## 2020-09-11 MED ORDER — CEPHALEXIN 500 MG PO CAPS: 500.0000 mg | ORAL_CAPSULE | Freq: Four times a day (QID) | ORAL | Status: DC

## 2020-09-11 MED ORDER — ENSURE ENLIVE PO LIQD: 237.0000 mL | Freq: Two times a day (BID) | ORAL | Status: AC

## 2020-09-11 MED ORDER — ACETAMINOPHEN ER 650 MG PO TBCR: 650.0000 mg | EXTENDED_RELEASE_TABLET | Freq: Three times a day (TID) | ORAL | Status: AC | PRN

## 2020-09-11 MED ORDER — ASPIRIN EC 81 MG PO TBEC
81.0000 mg | DELAYED_RELEASE_TABLET | Freq: Every day | ORAL | Status: DC
  Administered 2020-09-11: 17:00:00 81 mg via ORAL
  Filled 2020-09-11: qty 1

## 2020-09-11 MED ORDER — ASPIRIN 81 MG PO TBEC: 81.0000 mg | DELAYED_RELEASE_TABLET | Freq: Every day | ORAL | Status: DC

## 2020-09-11 NOTE — Progress Notes (Signed)
Brief Neuro Update:  Reviewed CT Angio head and neck and is negative. LDL is pending. Can be discharged with follow up preferably in stroke clinic.  No further inpatient neurological workup needed at this time.  [place] Pager Number [phone]

## 2020-09-11 NOTE — Progress Notes (Signed)
I briefly discussed this patient with Dr. Algis Liming and reviewed his MRI Brain. He does have a ?DWI focus in left pons, there does seem to be ADC correlate. With his stroke risk factors, will get stroke workup.   Full Stroke workup is yet top be completed but TTE with EF of 55-60%, no shunt on color flow doppler. HbA1c of 5.8. The appearance of stroke stroke seems tobe most suggestive of small vessel disease, likely a perforator in the pontine area.   Recs:  - I ordered CT angio head and neck, please page [place] or let us know once this is done.  - I ordered Aspirin 81mg  daily  - I ordered LDL.    [place] Pager Number [phone]

## 2020-09-11 NOTE — TOC Initial Note (Signed)
Transition of Care [facility]) - Initial/Assessment Note    Patient Details  Name: [name] MRN: [id number] Date of Birth: 1946/08/21  [facility]) CM/SW Contact:    [name], [place] Phone Number: 09/11/2020, 2:27 PM  Clinical Narrative:                  CSW spoke to admission coordinator at [facility] and rehab. Jackelyn Poling stated pt has been a long term resident for about 4 years. Pt is wheelchair bound and can self propel. Pt needs assistance with transfers and ADL's.   CSW reached out to pts legal guardian but did not get a response. [name] is able to take pt back at discharge. CSW requested covid test before discharge.   Expected Discharge Plan: Long Term Acute Care (LTAC) Barriers to Discharge: Continued Medical Work up   Patient Goals and CMS Choice        Expected Discharge Plan and Services Expected Discharge Plan: [place] (LTAC)         Expected Discharge Date: 09/11/20                                    Prior Living Arrangements/Services     Patient language and need for interpreter reviewed:: Yes Do you feel safe going back to the place where you live?: Yes      Need for Family Participation in Patient Care: Yes (Comment) Care giver support system in place?: Yes (comment)   Criminal Activity/Legal Involvement Pertinent to Current Situation/Hospitalization: No - Comment as needed  Activities of Daily Living Home Assistive Devices/Equipment: Other (Comment) (unable to assess) ADL Screening (condition at time of admission) Patient's cognitive ability adequate to safely complete daily activities?: No Is the patient deaf or have difficulty hearing?: Yes Does the patient have difficulty seeing, even when wearing glasses/contacts?: No Does the patient have difficulty concentrating, remembering, or making decisions?: Yes Patient able to express need for assistance with ADLs?: Yes Does the patient have difficulty dressing or  bathing?: Yes Independently performs ADLs?: No Communication: Needs assistance Dressing (OT): Dependent Grooming: Dependent Feeding: Dependent Bathing: Dependent Toileting: Dependent In/Out Bed: Dependent Walks in Home: Dependent Does the patient have difficulty walking or climbing stairs?: Yes Weakness of Legs: Both Weakness of Arms/Hands: Both  Permission Sought/Granted Permission sought to share information with : Facility Art therapist granted to share information with : Yes, Verbal Permission Granted     Permission granted to share info w AGENCY: SNF        Emotional Assessment Appearance:: Appears stated age Attitude/Demeanor/Rapport: Unable to Assess Affect (typically observed): Unable to Assess Orientation: : Oriented to Self Alcohol / Substance Use: Not Applicable Psych Involvement: No (comment)  Admission diagnosis:  Encephalopathy acute [G93.40] Septic shock ([place]) [A41.9, R65.21] Sepsis ([place]) [A41.9] Severe sepsis ([place]) [A41.9, R65.20] Patient Active Problem List   Diagnosis Date Noted  . Elevated LFTs   . Pressure injury of skin 09/07/2020  . Bacteremia due to Klebsiella pneumoniae 09/06/2020  . AKI (acute kidney injury) ([place]) 09/06/2020  . Lactic acidosis 09/06/2020  . Mild protein-calorie malnutrition ([place]) 09/06/2020  . Septic shock ([place]) 09/06/2020  . 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 Colonic mass 05/09/2019  . 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 Abdominal pain 05/08/2019  . Dementia   . SOB (shortness of breath) on exertion   . Hypotension   . Acute blood  loss anemia 05/01/2018  . Heme positive stool 05/01/2018  . Guaiac positive stools   . Iron deficiency anemia due to chronic blood loss   . GIB (gastrointestinal bleeding) 04/30/2018  . History of Bleeding ulcer   . Essential hypertension   . Encounter for screening colonoscopy 10/08/2011  . CLOSED DISLOCATION OF FOOT UNSPECIFIED PART 01/15/2010   PCP:  Caprice Renshaw, MD Pharmacy:    [facility] [place] [address] [place] [address] Phone: [phone] Fax: [phone]     Social Determinants of Health ([place]) Interventions    Readmission Risk Interventions Readmission Risk Prevention Plan 05/15/2019  Post Dischage Appt Not Complete  Appt Comments Pt returning to SNF. SNF MD will follow up at the facility  Medication Screening Complete  Transportation Screening Complete  Some recent data might be hidden   [name], [place], [place] Social Worker [phone]

## 2020-09-11 NOTE — Progress Notes (Signed)
Pt has not voided since foley d/c yesterday. Bladder scan showed 250ml. Notified on call M. Denny,NP via text page with new order received.

## 2020-09-11 NOTE — NC FL2 (Signed)
[place] LEVEL OF CARE SCREENING TOOL     IDENTIFICATION  Patient Name: [name] Birthdate: 1945/10/25 Sex: male Admission Date (Current Location): 09/06/2020  [facility] and [place] Number:  Herbalist and Address:  [facility], [place] [address] [place], [place] [address]      Provider Number: [id number]  Attending Physician Name and Address:  Modena Jansky, MD  Relative Name and Phone Number:       Current Level of Care: Hospital Recommended Level of Care: [place] Prior Approval Number:    Date Approved/Denied:   PASRR Number: [id number]  Discharge Plan: SNF    Current Diagnoses: Patient Active Problem List   Diagnosis Date Noted  . Elevated LFTs   . Pressure injury of skin 09/07/2020  . Bacteremia due to Klebsiella pneumoniae 09/06/2020  . AKI (acute kidney injury) ([place]) 09/06/2020  . Lactic acidosis 09/06/2020  . Mild protein-calorie malnutrition ([place]) 09/06/2020  . Septic shock ([place]) 09/06/2020  . 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 Colonic mass 05/09/2019  . 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 Abdominal pain 05/08/2019  . Dementia   . SOB (shortness of breath) on exertion   . Hypotension   . Acute blood loss anemia 05/01/2018  . Heme positive stool 05/01/2018  . Guaiac positive stools   . Iron deficiency anemia due to chronic blood loss   . GIB (gastrointestinal bleeding) 04/30/2018  . History of Bleeding ulcer   . Essential hypertension   . Encounter for screening colonoscopy 10/08/2011  . CLOSED DISLOCATION OF FOOT UNSPECIFIED PART 01/15/2010    Orientation RESPIRATION BLADDER Height & Weight     Self  Normal Incontinent Weight: 235 lb 14.3 oz (107 kg) Height:  5' 9.02" (175.3 cm)  BEHAVIORAL SYMPTOMS/MOOD NEUROLOGICAL BOWEL NUTRITION STATUS      Incontinent Diet (See discharge summary)  AMBULATORY STATUS COMMUNICATION OF NEEDS Skin   Extensive Assist Verbally PU Stage and Appropriate  Care                       Personal Care Assistance Level of Assistance  Bathing,Feeding,Dressing Bathing Assistance: Maximum assistance Feeding assistance: Maximum assistance Dressing Assistance: Maximum assistance     Functional Limitations Info  Sight,Hearing,Speech Sight Info: Adequate Hearing Info: Adequate Speech Info: Adequate    SPECIAL CARE FACTORS FREQUENCY  PT (By licensed PT),OT (By licensed OT)     PT Frequency: 5x a week OT Frequency: 5x a week            Contractures Contractures Info: Not present    Additional Factors Info  Code Status,Allergies Code Status Info: Full Allergies Info: Hydrocodone   Hydrocodone-acetaminophen   Aplisol           Current Medications (09/11/2020):  This is the current hospital active medication list Current Facility-Administered Medications  Medication Dose Route Frequency Provider Last Rate Last Admin  . 0.9 %  sodium chloride infusion  250 mL Intravenous Continuous Irine Seal, MD 10 mL/hr at 09/07/20 1150 250 mL at 09/07/20 1150  . acetaminophen (TYLENOL) tablet 650 mg  650 mg Oral Q6H PRN Irine Seal, MD   650 mg at 09/10/20 2125   Or  . acetaminophen (TYLENOL) suppository 650 mg  650 mg Rectal Q6H PRN Irine Seal, MD      . aspirin EC tablet 81 mg  81 mg Oral Daily Bhagat, Srishti L, MD      . atorvastatin (LIPITOR)  tablet 5 mg  5 mg Oral QHS Irine Seal, MD   5 mg at 09/10/20 2059  . cephALEXin (KEFLEX) capsule 500 mg  500 mg Oral Q6H Mignon Pine, DO   500 mg at 09/11/20 1152  . chlorhexidine (PERIDEX) 0.12 % solution 15 mL  15 mL Mouth Rinse BID Irine Seal, MD   15 mL at 09/11/20 1152  . Chlorhexidine Gluconate Cloth 2 % PADS 6 each  6 each Topical Daily Irine Seal, MD   6 each at 09/10/20 1027  . feeding supplement (ENSURE ENLIVE / ENSURE PLUS) liquid 237 mL  237 mL Oral BID BM Irine Seal, MD   237 mL at 09/11/20 1421  . ferrous sulfate tablet 325 mg  325 mg Oral Q1200 Irine Seal, MD   325 mg at  09/11/20 1152  . influenza vaccine adjuvanted (FLUAD) injection 0.5 mL  0.5 mL Intramuscular Tomorrow-1000 Collier Bullock, MD      . insulin aspart (novoLOG) injection 0-15 Units  0-15 Units Subcutaneous TID WC Irine Seal, MD   2 Units at 09/11/20 1242  . insulin aspart (novoLOG) injection 0-5 Units  0-5 Units Subcutaneous QHS Irine Seal, MD      . MEDLINE mouth rinse  15 mL Mouth Rinse q12n4p Irine Seal, MD   15 mL at 09/11/20 1200  . multivitamin with minerals tablet 1 tablet  1 tablet Oral Daily Irine Seal, MD   1 tablet at 09/11/20 1152  . ondansetron (ZOFRAN) tablet 4 mg  4 mg Oral Q6H PRN Irine Seal, MD       Or  . ondansetron [facility]) injection 4 mg  4 mg Intravenous Q6H PRN Irine Seal, MD      . oxyCODONE (Oxy IR/ROXICODONE) immediate release tablet 5 mg  5 mg Oral Q4H PRN Irine Seal, MD      . sodium chloride flush (NS) 0.9 % injection 10-40 mL  10-40 mL Intracatheter PRN Jacky Kindle, MD         Discharge Medications: Please see discharge summary for a list of discharge medications.  Relevant Imaging Results:  Relevant Lab Results:   Additional Information SS # [id number]  [name], [place]

## 2020-09-11 NOTE — Discharge Instructions (Signed)

## 2020-09-11 NOTE — Progress Notes (Signed)
[facility] for Infectious Disease  Date of Admission:  09/06/2020           Current antibiotics: Day 2 Cephalexin  Previous antibiotics: Cefazolin 12/14-12/15 Ceftriaxone 12/12-12/14 Vancomycin 12/11 Cefepime 12/11 Flagyl 12/11 Acyclovir 12/11  Reason for visit: Follow up on Kleb pna bacteremia  Interval events: Low grade fever overnight 100.7 WBC normal LFTs slight increase from yesterday Foley pulled last night, bladder scan this AM with 250cc  ASSESSMENT:    #Klebsiella pneumonia bacteremia Presented with septic shock secondary to urinary source from right proximal ureteral stone status post stenting 12/13  #Elevated LFTs  PLAN:    -- Continue oral Keflex 500mg  QID -- Plan for 7 days from date of stenting.  End date 09/15/20 -- Patient scheduled for right ureteroscopic stone extraction 09/30/2020 with urology.  Will defer antimicrobial prophylaxis prior to procedure to urology -- LFT elevation is mild and may be secondary to IV antibiotics received earlier in admission or may be related to sepsis.  Agree with discharge back to nursing home when otherwise stable and having them repeat LFTs in a few days   MEDICATIONS:    Scheduled Meds: . atorvastatin  5 mg Oral QHS  . cephALEXin  500 mg Oral Q6H  . chlorhexidine  15 mL Mouth Rinse BID  . Chlorhexidine Gluconate Cloth  6 each Topical Daily  . feeding supplement  237 mL Oral BID BM  . ferrous sulfate  325 mg Oral Q1200  . influenza vaccine adjuvanted  0.5 mL Intramuscular Tomorrow-1000  . insulin aspart  0-15 Units Subcutaneous TID WC  . insulin aspart  0-5 Units Subcutaneous QHS  . mouth rinse  15 mL Mouth Rinse q12n4p  . multivitamin with minerals  1 tablet Oral Daily    Continuous Infusions: . sodium chloride 250 mL (09/07/20 1150)    PRN Meds: acetaminophen **OR** acetaminophen, ondansetron **OR** ondansetron (ZOFRAN) IV, oxyCODONE, sodium chloride flush  SUBJECTIVE:   No new complaints.    Review of Systems  Constitutional: Positive for fever.  Respiratory: Negative.   Cardiovascular: Negative.   Gastrointestinal: Negative for abdominal pain, nausea and vomiting.  Genitourinary: Negative for dysuria.  Skin: Negative.      OBJECTIVE:   Allergies  Allergen Reactions  . Hydrocodone   . Hydrocodone-Acetaminophen     unknown  . Aplisol [Tuberculin Ppd] Rash    Blood pressure 138/71, pulse 80, temperature 98.2 F (36.8 C), temperature source Oral, resp. rate 15, height 5' 9.02" (1.753 m), weight 107 kg, SpO2 94 %. Body mass index is 34.82 kg/m.  Physical Exam Constitutional:      General: He is not in acute distress.    Appearance: Normal appearance.  Pulmonary:     Effort: Pulmonary effort is normal. No respiratory distress.  Abdominal:     Palpations: Abdomen is soft.     Tenderness: There is no abdominal tenderness.  Neurological:     General: No focal deficit present.     Mental Status: He is alert. Mental status is at baseline.       Lab Results & Microbiology Lab Results  Component Value Date   WBC 7.8 09/11/2020   HGB 10.2 (L) 09/11/2020   HCT 31.3 (L) 09/11/2020   MCV 92.3 09/11/2020   PLT 124 (L) 09/11/2020    Lab Results  Component Value Date   NA 141 09/11/2020   K 3.6 09/11/2020   CO2 28 09/11/2020   GLUCOSE 132 (H) 09/11/2020  BUN 18 09/11/2020   CREATININE 1.06 09/11/2020   CALCIUM 8.0 (L) 09/11/2020   GFRNONAA >60 09/11/2020   GFRAA >60 03/25/2020    Lab Results  Component Value Date   ALT 103 (H) 09/11/2020   AST 212 (H) 09/11/2020   ALKPHOS 141 (H) 09/11/2020   BILITOT 0.5 09/11/2020     I have reviewed the micro and lab results in Epic.  Imaging No results found.     Raynelle High[facility] for Infectious Disease [facility] [phone] pager 09/11/2020, 9:39 AM  I have spent a total of 15 minutes with the patient reviewing hospital notes,  test results, labs and examining the  patient as well as establishing an assessment and plan.

## 2020-09-11 NOTE — Progress Notes (Signed)
[facility] contacted for transportation.  

## 2020-09-11 NOTE — Discharge Summary (Addendum)
Physician Discharge Summary  [name] [name] IPJ:[id number] DOB: January 05, 1946  PCP: Douglas Renshaw, MD  Admitted from: SNF/long-term care facility. Discharged to: Discharging back to prior long-term care facility  Admit date: 09/06/2020 Discharge date: 09/11/2020  Recommendations for Outpatient Follow-up:    Follow-up Information    MD at SNF. Schedule an appointment as soon as possible for a visit.   Why: To be seen in 2 to 3 days with repeat labs (CBC & CMP).  Recommend palliative care consultation and follow-up at SNF/long-term care to address goals of care.  Also recommend SLP evaluation for advancing diet as needed       Douglas Renshaw, MD. Schedule an appointment as soon as possible for a visit.   Specialty: Internal Medicine Contact information: [place] [address] [phone]        Douglas Seal, MD Follow up.   Specialty: Urology Why: SNF MD to coordinate with Dr. Jeffie [name] for an upcoming Urology procedure next week. Contact information: [place] [address] [phone]        Douglas Fila, MD. Schedule an appointment as soon as possible for a visit in 4 week(s).   Specialties: Neurology, Radiology Contact information: [address] [place] [address] [place]: N/A    Equipment/Devices: None    Discharge Condition: Improved and stable   Code Status: Full Code Diet recommendation:  Discharge Diet Orders (From admission, onward)    Start     Ordered   09/11/20 0000  Diet - low sodium heart healthy       Comments: Diet consistency: Dysphagia 1 diet and thin liquids.  Full supervision, feeding assistance, medications in pure, crush.   09/11/20 1318           Discharge Diagnoses:  Principal Problem:   Bacteremia due to Klebsiella pneumoniae Active Problems:   Essential hypertension   AKI (acute kidney injury) ([place])   Lactic acidosis   Mild protein-calorie malnutrition  (HCC)   Septic shock (HCC)   Pressure injury of skin   Elevated LFTs   Brief Summary: 74 year old male, resident of long-term care facility, with history of dementia, Bell's palsy, stage III colon cancer, GI bleed, hypertension, hyperlipidemia and debility initially presented to the [facility] ED with altered mental status.  He was admitted to ICU for septic shock due to Klebsiella pneumonia bacteremia and pyelonephritis with obstructive uropathy.  He was transferred to [facility] for further care.  Blood cultures grew Klebsiella pneumonia.  Urine culture with insignificant growth x2.  CSF culture negative.  Patient underwent cystoscopy with right ureteral stent placement on 09/08/2020.  Antibiotics escalated to IV ceftriaxone.  Following stabilization, his care was transferred to [facility] on 09/09/2020.  There was also concern about a stroke on presentation and neurology was consulted in ED and recommended MRI with and without contrast to exclude metastasis to brain.  MRI brain without contrast reported as small foci of restricted diffusion involving the left pons and medulla which could be acute/subacute insults or artifact.   Assessment & Plan:   Severe sepsis with septic shock due to Klebsiella pneumonia bacteremia and pyelonephritis with obstructive uropathy, POA  Met sepsis criteria on admission with fever, tachycardia, tachypnea, hypotension, lactic acidosis, AKI and encephalopathy.  Required IV pressors.  Shock and sepsis physiology resolved.  Culture data as noted below.  TTE without significant findings.  Cefepime and vancomycin 12/11-12/12>>ceftriaxone 12/14>>IV Ancef>>  S/pcystoscopy with right ureteral stent placement on 12/13  ID consultation and follow-up appreciated.  Afebrile, leukocytosis resolved, repeat blood cultures drawn 12/13: NGTD, transitioned to oral Keflex on 12/15 to complete 7-day course from date of stenting, end date 09/15/20.    ID follow-up today  appreciated.  Mild LFT elevations may be secondary to IV antibiotics received earlier in admission or may be related to sepsis.  They agree with discharging back to nursing home with close follow-up of LFTs there.  As per Urology, patient scheduled for right ureteroscopic stone extraction 09/30/2020.  SNF to coordinate this with Urology.  Right ureteral stone with mild to moderate hydronephrosis  S/p cystoscopy and ureteral stent  Urology follow-up appreciated and plan for right ureteroscopic stone extraction on 09/30/2020  As per Dr. Jeffie [name] recommendations, discontinued Foley catheter on 12/15 and as per discussion with nursing, patient has been voiding without difficulty since catheter has been pulled.  Antibiotics as noted above.  Mild transaminitis  Unclear etiology.  No GI symptoms.  Please see discussion above.  Follow CMP closely at SNF.  Consider further evaluation if has worsening or does not resolve over the next several days.  Acute toxic metabolic encephalopathy complicating severe dementia:  Due to sepsis complicating underlying severe dementia.  Treat underlying cause as noted above.  Oriented only to self and state that this is "[facility]".    Baseline mental status is not known.  I am unable to reach patient's legal guardian and so have other providers before me.  I discussed with TOC team who will try to obtain his mental status and activity status from LTC.  It is possible that his mental status is back to his prior baseline.  Would avoid sedative or opioids.  Delirium precautions.  As per TOC follow-up with LTC on day of discharge:  Patient usually is oriented to self and place, follows commands.  Wheelchair-bound, can self propel, needs help with transfers, can feed self, needs help with ADLs.  At this time, mental status may be close to baseline.  Although he says [facility], he initially presented there prior to transfer.  He definitely is  more weaker and deconditioned now since prior report at [facility] likely related to septic shock and hospitalization.  Continue therapies eval at facility to see how much she can improve.  Suspected acute ischemic stroke  MRI results as noted above.  Discussed with neuro hospitalist today and completed stroke work-up.  They feel that his stroke is due to small vessel disease.  CTA head and neck: No acute intracranial abnormality or evidence of evolving recent infarction.  No large vessel occlusion or hemodynamically significant stenosis.  TTE shows LVEF of 55-60%.  A1c 5.8.  Direct LDL: 25.1/WNL  Aspirin 81 mg daily initiated.  Neurology recommends outpatient follow-up with Neurology.  Dysphagia:  SLP input 12/14 appreciated and recommended dysphagia 1 (pured) and thin liquids.  Patient tolerating diet.  He has to be fed.  Recommend SLP follow-up at SNF to advance diet as needed.  Acute kidney injury  Suspected due to combination of prerenal from dehydration and ATN from sepsis  Resolved.  Follow BMP periodically at SNF.  High anion gap metabolic acidosis/lactic acidosis  Resolved.  Debility/physical deconditioning/functional quadriplegia  Per therapies input, return to SNF/prior LTC facility.  History of colon cancer stage III  Outpatient follow-up.  Essential hypertension  Atenolol and Lasix had been held in the hospital in the  context of septic shock and AKI.  Blood pressures have normalized and also patient appears mildly volume overloaded.  Resumed low-dose atenolol and Lasix at DC.  Follow BMP closely at SNF.  Type II DM, controlled  A1c 5.8.    On Metformin PTA, this was held and patient was placed on NovoLog SSI.  At discharge resume Metformin and monitor CBGs closely  History of GI bleed  No overt GI bleed reported.  Hemoglobin gradually drifting down, likely dilutional and due to acute infectious etiology.  Hemoglobin now stable in the low  10 g range for the last 2 days.  Follow CBC closely at SNF.  Acute anemia  Management as noted above.  Hypokalemia/hypomagnesemia/hypophosphatemia  Replaced  Thrombocytopenia  Likely related to acute infectious etiology or IV antibiotics.    Stable.  Follow CBC closely at SNF.  Leukocytosis with left shift  Secondary to sepsis.  Resolved.  Goals of care  Patient with significant comorbidities as noted.  Currently full code which is felt to pose more harm than benefit if he were to have a code situation.  Prior MD was unable to reach legal guardian over the phone and neither could I.  Palliative care input appreciated, were also planning to reach out to legal guardian, currently full code and full scope of treatment.  Recommend palliative care consultation and follow-up at SNF to address goals of care.  Body mass index is 34.82 kg/m./Obesity.  Nutritional Status Nutrition Problem: Increased nutrient needs Etiology: wound healing Signs/Symptoms: estimated needs Interventions: Ensure Enlive (each supplement provides 350kcal and 20 grams of protein),MVI  Pressure Injury 09/07/20 Buttocks Mid Stage 2 -  Partial thickness loss of dermis presenting as a shallow open injury with a red, pink wound bed without slough. (Active)  09/07/20 0530  Location: Buttocks  Location Orientation: Mid  Staging: Stage 2 -  Partial thickness loss of dermis presenting as a shallow open injury with a red, pink wound bed without slough.  Wound Description (Comments):   Present on Admission: Yes       Consultants:   PCCM Urology Infectious disease Palliative care medicine  Procedures:   08/27/2020-lumbar puncture 09/08/2020-cystoscopy with right ureteral stent placement by Dr.Wrenn Foley catheter-discontinued 12/15  Antimicrobials:    Microbiology summarized: 12/12-fluAand B and COVID-19 PCR nonreactive 12/12-MRSA PCR nonreactive 12/11-blood cultures with pansensitive  Klebsiella pneumonia except to ampicillin 12/13-blood culture pending.  Previous antibiotics:  Ceftriaxone 12/12-12/14 Vancomycin 12/11 Cefepime 12/11 Flagyl 12/11 Acyclovir 12/11  Current antibiotics: Cefazolin 12/14 >>    Discharge Instructions  Discharge Instructions    (HEART FAILURE PATIENTS) Call MD:  Anytime you have any of the following symptoms: 1) 3 pound weight gain in 24 hours or 5 pounds in 1 week 2) shortness of breath, with or without a dry hacking cough 3) swelling in the hands, feet or stomach 4) if you have to sleep on extra pillows at night in order to breathe.   Complete by: As directed    Ambulatory referral to Neurology   Complete by: As directed    An appointment is requested in approximately: 4 weeks   Call MD for:   Complete by: As directed    Worsening confusion or mental status change.  Recurrent strokelike symptoms.   Call MD for:  difficulty breathing, headache or visual disturbances   Complete by: As directed    Call MD for:  extreme fatigue   Complete by: As directed    Call MD for:  persistant dizziness or  light-headedness   Complete by: As directed    Call MD for:  persistant nausea and vomiting   Complete by: As directed    Call MD for:  redness, tenderness, or signs of infection (pain, swelling, redness, odor or green/yellow discharge around incision site)   Complete by: As directed    Call MD for:  severe uncontrolled pain   Complete by: As directed    Call MD for:  temperature >100.4   Complete by: As directed    Diet - low sodium heart healthy   Complete by: As directed    Diet consistency: Dysphagia 1 diet and thin liquids.  Full supervision, feeding assistance, medications in pure, crush.   Discharge wound care:   Complete by: As directed    Pressure Injury 09/07/20 Buttocks Mid Stage 2 -  Partial thickness loss of dermis presenting as a shallow open injury with a red, pink wound bed without slough.  Recommend air overlay  mattress, frequent change of positions every 2-3 hours, local hygiene, dry dressing change daily and as needed.   Increase activity slowly   Complete by: As directed        Medication List    STOP taking these medications   linaclotide 290 MCG Caps capsule Commonly known as: Linzess   polyethylene glycol-electrolytes 420 g solution Commonly known as: NuLYTELY     TAKE these medications   acetaminophen 650 MG CR tablet Commonly known as: TYLENOL Take 1 tablet (650 mg total) by mouth every 8 (eight) hours as needed for pain or fever. What changed:   when to take this  reasons to take this   aspirin 81 MG EC tablet Take 1 tablet (81 mg total) by mouth daily. Swallow whole. Start taking on: September 12, 2020   atenolol 25 MG tablet Commonly known as: TENORMIN Take 25 mg by mouth daily.   atorvastatin 10 MG tablet Commonly known as: LIPITOR Take 5 mg by mouth at bedtime.   carboxymethylcellulose 0.5 % Soln Commonly known as: REFRESH PLUS Place 1 drop into both eyes every 6 (six) hours as needed (Dry eye).   cephALEXin 500 MG capsule Commonly known as: KEFLEX Take 1 capsule (500 mg total) by mouth 4 (four) times daily. Treat through 09/15/20   cholecalciferol 25 MCG (1000 UNIT) tablet Commonly known as: VITAMIN D3 Take 1,000 Units by mouth daily.   cycloSPORINE 0.05 % ophthalmic emulsion Commonly known as: RESTASIS Place 1 drop into both eyes 2 (two) times daily.   eucerin cream Apply 1 application topically daily. Apply to feet   Eucerin Eczema Relief 1 % Crea Generic drug: Colloidal Oatmeal Apply 1 application topically 2 (two) times daily. Apply to face and neck   feeding supplement Liqd Take 237 mLs by mouth 2 (two) times daily between meals.   ferrous sulfate 325 (65 FE) MG EC tablet Take 325 mg by mouth daily at 12 noon.   Fish Oil 1000 MG Caps Take 2,000 mg by mouth 2 (two) times daily.   furosemide 40 MG tablet Commonly known as: LASIX Take 40  mg by mouth daily.   ketoconazole 2 % shampoo Commonly known as: NIZORAL Apply 1 application topically every 12 (twelve) hours as needed for irritation. Apply to Scalp   metFORMIN 500 MG tablet Commonly known as: GLUCOPHAGE Take 500 mg by mouth at bedtime.   mometasone 0.1 % cream Commonly known as: ELOCON Apply 1 application topically daily as needed (Dry skin).   multivitamin tablet Take 1  tablet by mouth daily.   potassium chloride 20 MEQ packet Commonly known as: KLOR-CON Take 20 mEq by mouth 2 (two) times daily.   vitamin B-12 1000 MCG tablet Commonly known as: CYANOCOBALAMIN Take 1,000 mcg by mouth daily.      Allergies  Allergen Reactions  . Hydrocodone   . Hydrocodone-Acetaminophen     unknown  . Aplisol [Tuberculin Ppd] Rash      Procedures/Studies: CT ANGIO HEAD W OR WO CONTRAST  Result Date: 09/11/2020 CLINICAL DATA:  Code stroke follow-up EXAM: CT ANGIOGRAPHY HEAD AND NECK TECHNIQUE: Multidetector CT imaging of the head and neck was performed using the standard protocol during bolus administration of intravenous contrast. Multiplanar CT image reconstructions and MIPs were obtained to evaluate the vascular anatomy. Carotid stenosis measurements (when applicable) are obtained utilizing NASCET criteria, using the distal internal carotid diameter as the denominator. CONTRAST:  30m OMNIPAQUE IOHEXOL 350 MG/ML SOLN COMPARISON:  Head CT 09/06/2020, MRI brain 09/07/2020 FINDINGS: CT HEAD Brain: There is no acute intracranial hemorrhage, mass effect, or edema. No new loss of gray-white differentiation. Calcification is again identified within the central white matter and basal ganglia. Patchy hypoattenuation in the supratentorial white matter is nonspecific but may reflect mild chronic microvascular ischemic changes. There is no extra-axial fluid collection. Ventricles and sulci are within normal limits in size and configuration. Vascular: No new finding. Skull: Calvarium  is unremarkable. Sinuses/Orbits: Paranasal sinus mucosal thickening. No new orbital finding. Other: Right mastoid and middle ear opacification. Review of the MIP images confirms the above findings CTA NECK Aortic arch: Great vessel origins are patent. Right carotid system: Patent. Minimal calcified plaque along the proximal internal carotid without measurable stenosis. Left carotid system: Patent. Mild calcified plaque at the ICA origin without measurable stenosis. Vertebral arteries: Patent and codominant.  No measurable stenosis. Skeleton: Multilevel degenerative changes of the cervical spine. Other neck: No mass or adenopathy. Upper chest: No apical lung mass. Review of the MIP images confirms the above findings CTA HEAD Anterior circulation: Intracranial internal carotid arteries are patent with minimal calcified plaque. Anterior and middle cerebral arteries are patent. Posterior circulation: Intracranial vertebral arteries, basilar artery, and posterior cerebral arteries are patent. Venous sinuses: Patent as allowed by contrast bolus timing. Review of the MIP images confirms the above findings IMPRESSION: No acute intracranial abnormality or evidence of evolving recent infarction. No large vessel occlusion or hemodynamically significant stenosis. Electronically Signed   By: PMacy MisM.D.   On: 09/11/2020 16:08   CT ANGIO NECK W OR WO CONTRAST  Result Date: 09/11/2020 CLINICAL DATA:  Code stroke follow-up EXAM: CT ANGIOGRAPHY HEAD AND NECK TECHNIQUE: Multidetector CT imaging of the head and neck was performed using the standard protocol during bolus administration of intravenous contrast. Multiplanar CT image reconstructions and MIPs were obtained to evaluate the vascular anatomy. Carotid stenosis measurements (when applicable) are obtained utilizing NASCET criteria, using the distal internal carotid diameter as the denominator. CONTRAST:  752mOMNIPAQUE IOHEXOL 350 MG/ML SOLN COMPARISON:  Head CT  09/06/2020, MRI brain 09/07/2020 FINDINGS: CT HEAD Brain: There is no acute intracranial hemorrhage, mass effect, or edema. No new loss of gray-white differentiation. Calcification is again identified within the central white matter and basal ganglia. Patchy hypoattenuation in the supratentorial white matter is nonspecific but may reflect mild chronic microvascular ischemic changes. There is no extra-axial fluid collection. Ventricles and sulci are within normal limits in size and configuration. Vascular: No new finding. Skull: Calvarium is unremarkable. Sinuses/Orbits: Paranasal sinus mucosal  thickening. No new orbital finding. Other: Right mastoid and middle ear opacification. Review of the MIP images confirms the above findings CTA NECK Aortic arch: Great vessel origins are patent. Right carotid system: Patent. Minimal calcified plaque along the proximal internal carotid without measurable stenosis. Left carotid system: Patent. Mild calcified plaque at the ICA origin without measurable stenosis. Vertebral arteries: Patent and codominant.  No measurable stenosis. Skeleton: Multilevel degenerative changes of the cervical spine. Other neck: No mass or adenopathy. Upper chest: No apical lung mass. Review of the MIP images confirms the above findings CTA HEAD Anterior circulation: Intracranial internal carotid arteries are patent with minimal calcified plaque. Anterior and middle cerebral arteries are patent. Posterior circulation: Intracranial vertebral arteries, basilar artery, and posterior cerebral arteries are patent. Venous sinuses: Patent as allowed by contrast bolus timing. Review of the MIP images confirms the above findings IMPRESSION: No acute intracranial abnormality or evidence of evolving recent infarction. No large vessel occlusion or hemodynamically significant stenosis. Electronically Signed   By: Macy Mis M.D.   On: 09/11/2020 16:08   MR BRAIN WO CONTRAST  Result Date: 09/07/2020 CLINICAL  DATA:  Mental status change, unknown cause EXAM: MRI HEAD WITHOUT CONTRAST TECHNIQUE: Multiplanar, multiecho pulse sequences of the brain and surrounding structures were obtained without intravenous contrast. COMPARISON:  09/06/2020 and prior FINDINGS: Examination was terminated early secondary to patient disposition. Please note that limited sequence acquisition and motion artifact limits evaluation. Brain: No midline shift, mass lesion or ventriculomegaly. No extra-axial fluid collection. No intracranial hemorrhage. Small foci of diffusion-weighted hyperintensity with mild ADC correlate involving the left pons and medulla. Vascular: Not well evaluated. Skull and upper cervical spine: No focal lesions on limited sequence. Sinuses/Orbits: Not well evaluated. Other: None. IMPRESSION: Small foci of restricted diffusion involving the left pons and medulla may reflect acute/subacute insults versus artifact. Limited examination secondary to early termination and motion artifact. Electronically Signed   By: Primitivo Gauze M.D.   On: 09/07/2020 06:28   DG Retrograde Pyelogram  Result Date: 09/09/2020 CLINICAL DATA:  74 year old male undergoing retrograde ureteral stent placement. EXAM: RETROGRADE PYELOGRAM COMPARISON:  None. FINDINGS: Two fluoroscopic images submitted demonstrate right double-J nephroureteral stent in place with pigtail portions in the expected locations of the renal pelvis and bladder. No diagnostic quality iodinated contrast opacification is appreciated to evaluate the collecting system. IMPRESSION: Two fluoroscopic images submitted demonstrate right double-J nephroureteral stent in place with pigtail portions in the expected locations of the renal pelvis and bladder. Ruthann Cancer, MD [facility] Radiology Electronically Signed   By: Ruthann Cancer MD   On: 09/09/2020 08:01   DG Chest Port 1 View  Result Date: 09/06/2020 CLINICAL DATA:   Slurred speech. EXAM: PORTABLE CHEST 1 VIEW COMPARISON:  July 09, 2019. FINDINGS: Stable cardiomediastinal silhouette. No pneumothorax or pleural effusion is noted. Both lungs are clear. The visualized skeletal structures are unremarkable. IMPRESSION: No active disease. Electronically Signed   By: Marijo Conception M.D.   On: 09/06/2020 11:30   DG C-Arm 1-60 Min  Result Date: 09/08/2020 CLINICAL DATA:  Cystoscopy with retrograde pyelogram and ureteral stent placement. EXAM: DG C-ARM 1-60 MIN CONTRAST:  Not provided. FLUOROSCOPY TIME:  Fluoroscopy Time:  28.9 seconds Radiation Exposure Index (if provided by the fluoroscopic device): 11.16 mGy Number of Acquired Spot Images: 2 COMPARISON:  CT yesterday. FINDINGS: Two fluoroscopic spot views obtained of the abdomen in frontal projection. Ovoid density projecting over the mid right distal ureter corresponding to  ureteral stone on CT. There is a right ureteral stent in place. IMPRESSION: Intraoperative fluoroscopic spot views with right ureteral stone and stent in place. Electronically Signed   By: Keith Rake M.D.   On: 09/08/2020 18:30   ECHOCARDIOGRAM COMPLETE  Result Date: 09/07/2020    ECHOCARDIOGRAM REPORT   Patient Name:   [name] Date of Exam: 09/07/2020 Medical Rec #:  [id number]   Height:       69.0 in Accession #:    [id number]  Weight:       235.9 lb Date of Birth:  [birth year]   BSA:          2.216 m Patient Age:    70 years    BP:           109/48 mmHg Patient Gender: M           HR:           103 bpm. Exam Location:  Inpatient Procedure: Limited Echo Indications:    Acute respiratory failure  History:        Patient has prior history of Echocardiogram examinations, most                 recent 05/09/2019. Signs/Symptoms:Altered Mental Status; Risk                 Factors:Hypertension and Dyslipidemia. Dementia.  Sonographer:    Dustin Flock Referring Phys: [id number] [place]  Sonographer Comments: Technically challenging study due  to limited acoustic windows. Image acquisition challenging due to patient behavioral factors. and Image acquisition challenging due to uncooperative patient. IMPRESSIONS  1. Limited windows for image acquisition and forshortened images limit accurate assessment of LV function but grossly appears normal.  2. Left ventricular ejection fraction, by estimation, is 55 to 60%. The left ventricle has normal function. Left ventricular endocardial border not optimally defined to evaluate regional wall motion. Left ventricular diastolic function could not be evaluated.  3. Right ventricular systolic function was not well visualized. The right ventricular size is not well visualized. Tricuspid regurgitation signal is inadequate for assessing PA pressure.  4. The mitral valve is normal in structure. No evidence of mitral valve regurgitation. No evidence of mitral stenosis.  5. The aortic valve is normal in structure. Aortic valve regurgitation is not visualized. No aortic stenosis is present.  6. The inferior vena cava is normal in size with greater than 50% respiratory variability, suggesting right atrial pressure of 3 mmHg. FINDINGS  Left Ventricle: Left ventricular ejection fraction, by estimation, is 55 to 60%. The left ventricle has normal function. Left ventricular endocardial border not optimally defined to evaluate regional wall motion. The left ventricular internal cavity size was normal in size. There is no left ventricular hypertrophy. Left ventricular diastolic function could not be evaluated. Right Ventricle: The right ventricular size is not well visualized. Right vetricular wall thickness was not assessed. Right ventricular systolic function was not well visualized. Tricuspid regurgitation signal is inadequate for assessing PA pressure. Left Atrium: Left atrial size was normal in size. Right Atrium: Right atrial size was not well visualized. Pericardium: There is no evidence of pericardial effusion. Mitral Valve:  The mitral valve is normal in structure. No evidence of mitral valve regurgitation. No evidence of mitral valve stenosis. Tricuspid Valve: The tricuspid valve is normal in structure. Tricuspid valve regurgitation is not demonstrated. No evidence of tricuspid stenosis. Aortic Valve: The aortic valve is normal in structure. Aortic valve regurgitation is not visualized.  No aortic stenosis is present. Pulmonic Valve: The pulmonic valve was normal in structure. Pulmonic valve regurgitation is not visualized. No evidence of pulmonic stenosis. Aorta: The aortic root is normal in size and structure and aortic root could not be assessed. Venous: The inferior vena cava was not well visualized. The inferior vena cava is normal in size with greater than 50% respiratory variability, suggesting right atrial pressure of 3 mmHg. IAS/Shunts: No atrial level shunt detected by color flow Doppler.  LEFT VENTRICLE PLAX 2D LVIDd:         5.20 cm LVIDs:         3.90 cm LV PW:         1.10 cm LV IVS:        1.10 cm LVOT diam:     2.60 cm LVOT Area:     5.31 cm  LEFT ATRIUM         Index LA diam:    3.50 cm 1.58 cm/m   AORTA Ao Root diam: 3.30 cm  SHUNTS Systemic Diam: 2.60 cm Fransico Him MD Electronically signed by Fransico Him MD Signature Date/Time: 09/07/2020/3:26:13 PM    Final    CT HEAD CODE STROKE WO CONTRAST  Result Date: 09/06/2020 CLINICAL DATA:  Code stroke.  Neuro deficit, acute, stroke suspected EXAM: CT HEAD WITHOUT CONTRAST TECHNIQUE: Contiguous axial images were obtained from the base of the skull through the vertex without intravenous contrast. COMPARISON:  02/28/2012 and prior FINDINGS: Brain: No acute infarct or intracranial hemorrhage. No mass lesion. No midline shift, ventriculomegaly or extra-axial fluid collection. Chronic basal ganglia and deep white matter calcifications, unchanged. Vascular: No hyperdense vessel or unexpected calcification. Skull: No acute finding. Sinuses/Orbits: No acute orbital finding.  Pneumatized paranasal sinuses. Right mastoid effusion. Other: None. ASPECTS [facility] Stroke Program Early CT Score) - Ganglionic level infarction (caudate, lentiform nuclei, internal capsule, insula, M1-M3 cortex): 7 - Supraganglionic infarction (M4-M6 cortex): 3 Total score (0-10 with 10 being normal): 10 IMPRESSION: 1. No acute intracranial process. Chronic basal ganglia/white matter calcifications, unchanged. 2. ASPECTS is 10 Code stroke imaging results were communicated on 09/06/2020 at 10:30 am to provider Dr. Melina Copa via telephone, who verbally acknowledged these results. Electronically Signed   By: Primitivo Gauze M.D.   On: 09/06/2020 10:31   CT ANGIO ABDOMEN PELVIS  W &/OR WO CONTRAST  Result Date: 09/07/2020 CLINICAL DATA:  Mesenteric ischemia EXAM: CTA ABDOMEN AND PELVIS WITHOUT AND WITH CONTRAST TECHNIQUE: Multidetector CT imaging of the abdomen and pelvis was performed using the standard protocol during bolus administration of intravenous contrast. Multiplanar reconstructed images and MIPs were obtained and reviewed to evaluate the vascular anatomy. CONTRAST:  59m OMNIPAQUE IOHEXOL 350 MG/ML SOLN COMPARISON:  12/25/2018 FINDINGS: VASCULAR Scattered aortic atherosclerosis. The aorta is normal in contour and caliber with a standard branching pattern and solitary bilateral renal arteries. The branch vessels, including the superior mesenteric artery, are widely patent from their origins. Review of the MIP images confirms the above findings. NON-VASCULAR Lower chest: Trace bilateral pleural effusions and associated atelectasis or consolidation. Hepatobiliary: No solid liver abnormality is seen. No gallstones, gallbladder wall thickening, or biliary dilatation. Pancreas: Unremarkable. No pancreatic ductal dilatation or surrounding inflammatory changes. Spleen: Normal in size without significant abnormality. Adrenals/Urinary Tract: Adrenal glands are unremarkable. Multiple small bilateral renal  calculi. There is a 7 mm calculus in the proximal third of right ureter with mild associated hydronephrosis (series 10, image 132). Fat stranding about the right kidney and the adjacent transverse portion  of the duodenum (series 10, image 120). Wall thickening and multiple small diverticula of the urinary bladder. Stomach/Bowel: Stomach is within normal limits. Postoperative findings of the right hemicolon, likely terminal ileocolectomy. No evidence of bowel wall thickening, distention, or inflammatory changes. Lymphatic: No enlarged abdominal or pelvic lymph nodes. Reproductive: No mass or other significant abnormality. Other: No abdominal wall hernia or abnormality. No abdominopelvic ascites. Musculoskeletal: No acute or significant osseous findings. IMPRESSION: 1. The aorta is normal in contour and caliber with a standard branching pattern and solitary bilateral renal arteries. Scattered aortic atherosclerosis. The branch vessels, including the superior mesenteric artery, are widely patent from their origins. No evidence of mesenteric ischemia. 2. There is a 7 mm calculus in the proximal third of right ureter with mild associated hydronephrosis. Reactive fat stranding about the right kidney and the adjacent transverse portion of the duodenum. 3. Multiple additional small bilateral renal calculi. 4. Wall thickening and multiple small diverticula of the urinary bladder, suggestive of chronic outlet obstruction. 5. Trace bilateral pleural effusions and associated atelectasis or consolidation. Aortic Atherosclerosis (ICD10-I70.0). Electronically Signed   By: Eddie Candle M.D.   On: 09/07/2020 13:11      Subjective: Patient is a poor historian.  Awake, alert, oriented to self and "[facility]".  States "feel pretty good".  Specifically denies complaints.  Denies pain.  As per nursing, has been voiding well post Foley catheter removal yesterday.  Tolerating diet.  Discharge Exam:  Vitals:   09/10/20  1531 09/10/20 2049 09/11/20 0521 09/11/20 1354  BP: 137/63 140/69 138/71 (!) 149/69  Pulse: 87 88 80 91  Resp:  16 15 (!) 24  Temp: 98.6 F (37 C) (!) 100.7 F (38.2 C) 98.2 F (36.8 C) (!) 97.5 F (36.4 C)  TempSrc: Oral Oral Oral Oral  SpO2: 96% 93% 94% 95%  Weight:      Height:        General exam: Pleasant elderly male, moderately built and obese lying comfortably propped up in bed without distress. Respiratory system: Clear to auscultation anteriorly.  Slightly diminished in the bases but otherwise clear posteriorly as well without wheezing, rhonchi or crackles.  No increased work of breathing. Cardiovascular system: S1 & S2 heard, RRR. No JVD, murmurs, rubs, gallops or clicks.  Trace bilateral leg and some upper extremity edema.  Telemetry personally reviewed: Sinus rhythm. Gastrointestinal system: Abdomen is nondistended, soft and nontender. No organomegaly or masses felt. Normal bowel sounds heard. Central nervous system: Alert and oriented only to self and "[facility]". No focal neurological deficits. Extremities: Upper extremities with grade 4 x 5 bilateral handgrip, at least 2 x 5 power proximally but unable to objectively evaluate due to lack of full cooperation from patient.  Likewise lower extremity at least grade 2 x 5 power, wiggles toes but unable to objectively evaluate due to lack of full cooperation from patient.  This is the same exam that he had even yesterday. Skin: No rashes, lesions or ulcers Psychiatry: Judgement and insight impaired. Mood & affect flat.     The results of significant diagnostics from this hospitalization (including imaging, microbiology, ancillary and laboratory) are listed below for reference.     Microbiology: Recent Results (from the past 240 hour(s))  Culture, blood (routine x 2)     Status: Abnormal   Collection Time: 09/06/20 11:04 AM   Specimen: BLOOD RIGHT HAND  Result Value Ref Range Status   Specimen Description   Final     BLOOD  RIGHT HAND BOTTLES DRAWN AEROBIC AND ANAEROBIC Performed at [facility], [address] [place], [place] [address]    Special Requests   Final    Blood Culture adequate volume Performed at [facility], [address], [place], [place] [address]    Culture  Setup Time   Final    GRAM NEGATIVE RODS Gram Stain Report Called to,Read Back By and Verified With: PRUITT,G @ 1191 ON 09/07/20 BY JUW GS DONE @ [facility] CRITICAL RESULT CALLED TO, READ BACK BY AND VERIFIED WITH: PHARMD CATHY P. [id number] FCP Performed at [facility], [place] [address], [place], [place] [address]    Culture KLEBSIELLA PNEUMONIAE (A)  Final   Report Status 09/09/2020 FINAL  Final   Organism ID, Bacteria KLEBSIELLA PNEUMONIAE  Final      Susceptibility   Klebsiella pneumoniae - MIC*    AMPICILLIN RESISTANT Resistant     CEFAZOLIN <=4 SENSITIVE Sensitive     CEFEPIME <=0.12 SENSITIVE Sensitive     CEFTAZIDIME <=1 SENSITIVE Sensitive     CEFTRIAXONE <=0.25 SENSITIVE Sensitive     CIPROFLOXACIN <=0.25 SENSITIVE Sensitive     GENTAMICIN <=1 SENSITIVE Sensitive     IMIPENEM <=0.25 SENSITIVE Sensitive     TRIMETH/SULFA <=20 SENSITIVE Sensitive     AMPICILLIN/SULBACTAM 4 SENSITIVE Sensitive     PIP/TAZO <=4 SENSITIVE Sensitive     * KLEBSIELLA PNEUMONIAE  Blood Culture ID Panel (Reflexed)     Status: Abnormal   Collection Time: 09/06/20 11:04 AM  Result Value Ref Range Status   Enterococcus faecalis NOT DETECTED NOT DETECTED Final   Enterococcus Faecium NOT DETECTED NOT DETECTED Final   Listeria monocytogenes NOT DETECTED NOT DETECTED Final   Staphylococcus species NOT DETECTED NOT DETECTED Final   Staphylococcus aureus (BCID) NOT DETECTED NOT DETECTED Final   Staphylococcus epidermidis NOT DETECTED NOT DETECTED Final   Staphylococcus lugdunensis NOT DETECTED NOT DETECTED Final   Streptococcus species NOT DETECTED NOT DETECTED Final   Streptococcus agalactiae NOT DETECTED NOT DETECTED Final    Streptococcus pneumoniae NOT DETECTED NOT DETECTED Final   Streptococcus pyogenes NOT DETECTED NOT DETECTED Final   A.calcoaceticus-baumannii NOT DETECTED NOT DETECTED Final   Bacteroides fragilis NOT DETECTED NOT DETECTED Final   Enterobacterales DETECTED (A) NOT DETECTED Final    Comment: Enterobacterales represent a large order of gram negative bacteria, not a single organism. CRITICAL RESULT CALLED TO, READ BACK BY AND VERIFIED WITH: PHARMD CATHY P. [id number] FCP    Enterobacter cloacae complex NOT DETECTED NOT DETECTED Final   Escherichia coli NOT DETECTED NOT DETECTED Final   Klebsiella aerogenes NOT DETECTED NOT DETECTED Final   Klebsiella oxytoca NOT DETECTED NOT DETECTED Final   Klebsiella pneumoniae DETECTED (A) NOT DETECTED Final    Comment: CRITICAL RESULT CALLED TO, READ BACK BY AND VERIFIED WITH: PHARMD CATHY P. [id number] FCP    Proteus species NOT DETECTED NOT DETECTED Final   Salmonella species NOT DETECTED NOT DETECTED Final   Serratia marcescens NOT DETECTED NOT DETECTED Final   Haemophilus influenzae NOT DETECTED NOT DETECTED Final   Neisseria meningitidis NOT DETECTED NOT DETECTED Final   Pseudomonas aeruginosa NOT DETECTED NOT DETECTED Final   Stenotrophomonas maltophilia NOT DETECTED NOT DETECTED Final   Candida albicans NOT DETECTED NOT DETECTED Final   Candida auris NOT DETECTED NOT DETECTED Final   Candida glabrata NOT DETECTED NOT DETECTED Final   Candida krusei NOT DETECTED NOT DETECTED Final   Candida parapsilosis NOT DETECTED NOT DETECTED  Final   Candida tropicalis NOT DETECTED NOT DETECTED Final   Cryptococcus neoformans/gattii NOT DETECTED NOT DETECTED Final   CTX-M ESBL NOT DETECTED NOT DETECTED Final   Carbapenem resistance IMP NOT DETECTED NOT DETECTED Final   Carbapenem resistance KPC NOT DETECTED NOT DETECTED Final   Carbapenem resistance NDM NOT DETECTED NOT DETECTED Final   Carbapenem resist OXA 48 LIKE NOT DETECTED NOT DETECTED Final    Carbapenem resistance VIM NOT DETECTED NOT DETECTED Final    Comment: Performed at [facility], [place] [address], [place], [place] [address]  Culture, blood (routine x 2)     Status: Abnormal   Collection Time: 09/06/20 11:11 AM   Specimen: Left Antecubital; Blood  Result Value Ref Range Status   Specimen Description   Final    LEFT ANTECUBITAL BOTTLES DRAWN AEROBIC AND ANAEROBIC Performed at [facility], [address], [place], [place] [address]    Special Requests   Final    Blood Culture adequate volume Performed at [facility], [address], [place], [place] [address]    Culture  Setup Time   Final    GRAM NEGATIVE RODS Gram Stain Report Called to,Read Back By and Verified With: PRUITT,G @ 6270 ON 09/07/20 BY JUW GS DONE @ [facility]    Culture (A)  Final    KLEBSIELLA PNEUMONIAE SUSCEPTIBILITIES PERFORMED ON PREVIOUS CULTURE WITHIN THE LAST 5 DAYS. Performed at [facility], [place] [address], [place], [place] [address]    Report Status 09/09/2020 FINAL  Final  Resp Panel by RT-PCR (Flu A&B, Covid) Nasopharyngeal Swab     Status: None   Collection Time: 09/06/20 11:13 AM   Specimen: Nasopharyngeal Swab; Nasopharyngeal(NP) swabs in vial transport medium  Result Value Ref Range Status   SARS Coronavirus 2 by RT PCR NEGATIVE NEGATIVE Final    Comment: (NOTE) SARS-CoV-2 target nucleic acids are NOT DETECTED.  The SARS-CoV-2 RNA is generally detectable in upper respiratory specimens during the acute phase of infection. The lowest concentration of SARS-CoV-2 viral copies this assay can detect is 138 copies/mL. A negative result does not preclude SARS-Cov-2 infection and should not be used as the sole basis for treatment or other patient management decisions. A negative result may occur with  improper specimen collection/handling, submission of specimen other than nasopharyngeal swab, presence of viral mutation(s) within the areas targeted by this assay, and inadequate  number of viral copies(<138 copies/mL). A negative result must be combined with clinical observations, patient history, and epidemiological information. The expected result is Negative.  Fact Sheet for Patients:  EntrepreneurPulse.com.au  Fact Sheet for Healthcare Providers:  IncredibleEmployment.be  This test is no t yet approved or cleared by the [place] FDA and  has been authorized for detection and/or diagnosis of SARS-CoV-2 by FDA under an Emergency Use Authorization (EUA). This EUA will remain  in effect (meaning this test can be used) for the duration of the COVID-19 declaration under Section 564(b)(1) of the Act, 21 U.S.C.section 360bbb-3(b)(1), unless the authorization is terminated  or revoked sooner.       Influenza A by PCR NEGATIVE NEGATIVE Final   Influenza B by PCR NEGATIVE NEGATIVE Final    Comment: (NOTE) The Xpert Xpress SARS-CoV-2/FLU/RSV plus assay is intended as an aid in the diagnosis of influenza from Nasopharyngeal swab specimens and should not be used as a sole basis for treatment. Nasal washings and aspirates are unacceptable for Xpert Xpress SARS-CoV-2/FLU/RSV testing.  Fact Sheet for Patients: EntrepreneurPulse.com.au  Fact Sheet for Healthcare Providers: IncredibleEmployment.be  This test is not yet approved or cleared by the [place] FDA and has been authorized for detection and/or diagnosis of SARS-CoV-2 by FDA under an Emergency Use Authorization (EUA). This EUA will remain in effect (meaning this test can be used) for the duration of the COVID-19 declaration under Section 564(b)(1) of the Act, 21 U.S.C. section 360bbb-3(b)(1), unless the authorization is terminated or revoked.  Performed at [facility], [address] [place], [place] [address]   CSF culture     Status: None   Collection Time: 09/06/20  4:12 PM   Specimen: CSF; Cerebrospinal Fluid  Result  Value Ref Range Status   Specimen Description   Final    CSF Performed at [facility], [address], [place], [place] [address]    Special Requests   Final    TUBE 1 Performed at [facility], [place] [address], [place], [place] [address]    Gram Stain   Final    NO ORGANISMS SEEN Performed at [facility], [address], [place], [place] [address]    Culture   Final    NO GROWTH 3 DAYS Performed at [facility], [place] [address] [place], [place] [address]    Report Status 09/10/2020 FINAL  Final  Urine culture     Status: Abnormal   Collection Time: 09/06/20  6:25 PM   Specimen: Urine, Clean Catch  Result Value Ref Range Status   Specimen Description   Final    URINE, CLEAN CATCH Performed at [facility], [address] [place], [place] [address]    Special Requests   Final    NONE Performed at [facility], [address] [place], [place] [address]    Culture (A)  Final    <10,000 COLONIES/mL INSIGNIFICANT GROWTH Performed at [facility], [place] [address], [place], [place] [address]    Report Status 09/08/2020 FINAL  Final  MRSA PCR Screening     Status: None   Collection Time: 09/07/20  4:24 AM   Specimen: Nasopharyngeal  Result Value Ref Range Status   MRSA by PCR NEGATIVE NEGATIVE Final    Comment:        The GeneXpert MRSA Assay (FDA approved for NASAL specimens only), is one component of a comprehensive MRSA colonization surveillance program. It is not intended to diagnose MRSA infection nor to guide or monitor treatment for MRSA infections. Performed at [facility], [place] [address], [place], [place] [address]   Urine culture     Status: Abnormal   Collection Time: 09/07/20  9:04 AM   Specimen: Urine, Random  Result Value Ref Range Status   Specimen Description URINE, RANDOM  Final   Special Requests NONE  Final   Culture (A)  Final    <10,000 COLONIES/mL INSIGNIFICANT GROWTH Performed at [facility], [place] [address], [place], [place] [address]    Report Status 09/08/2020 FINAL  Final  Culture, blood (routine x 2)     Status: None (Preliminary result)   Collection Time: 09/08/20 11:57 AM   Specimen: BLOOD  Result Value Ref Range Status   Specimen Description BLOOD LEFT ANTECUBITAL  Final   Special Requests   Final    BOTTLES DRAWN AEROBIC AND ANAEROBIC Blood Culture adequate volume   Culture   Final    NO GROWTH 2 DAYS Performed at [facility], [place] [address], [place], [place] [address]  Report Status PENDING  Incomplete  Culture, blood (routine x 2)     Status: None (Preliminary result)   Collection Time: 09/08/20 12:03 PM   Specimen: BLOOD  Result Value Ref Range Status   Specimen Description BLOOD RIGHT ANTECUBITAL  Final   Special Requests BOTTLES DRAWN AEROBIC ONLY  Final   Culture   Final    NO GROWTH 2 DAYS Performed at [facility], [address] [place], [place] [address]    Report Status PENDING  Incomplete  Resp Panel by RT-PCR (Flu A&B, Covid) Nasopharyngeal Swab     Status: None   Collection Time: 09/11/20  2:25 PM   Specimen: Nasopharyngeal Swab; Nasopharyngeal(NP) swabs in vial transport medium  Result Value Ref Range Status   SARS Coronavirus 2 by RT PCR NEGATIVE NEGATIVE Final    Comment: (NOTE) SARS-CoV-2 target nucleic acids are NOT DETECTED.  The SARS-CoV-2 RNA is generally detectable in upper respiratory specimens during the acute phase of infection. The lowest concentration of SARS-CoV-2 viral copies this assay can detect is 138 copies/mL. A negative result does not preclude SARS-Cov-2 infection and should not be used as the sole basis for treatment or other patient management decisions. A negative result may occur with  improper specimen collection/handling, submission of specimen other than nasopharyngeal swab, presence of viral mutation(s) within the areas targeted by this assay, and inadequate number of viral copies(<138 copies/mL). A negative  result must be combined with clinical observations, patient history, and epidemiological information. The expected result is Negative.  Fact Sheet for Patients:  EntrepreneurPulse.com.au  Fact Sheet for Healthcare Providers:  IncredibleEmployment.be  This test is no t yet approved or cleared by the [place] FDA and  has been authorized for detection and/or diagnosis of SARS-CoV-2 by FDA under an Emergency Use Authorization (EUA). This EUA will remain  in effect (meaning this test can be used) for the duration of the COVID-19 declaration under Section 564(b)(1) of the Act, 21 U.S.C.section 360bbb-3(b)(1), unless the authorization is terminated  or revoked sooner.       Influenza A by PCR NEGATIVE NEGATIVE Final   Influenza B by PCR NEGATIVE NEGATIVE Final    Comment: (NOTE) The Xpert Xpress SARS-CoV-2/FLU/RSV plus assay is intended as an aid in the diagnosis of influenza from Nasopharyngeal swab specimens and should not be used as a sole basis for treatment. Nasal washings and aspirates are unacceptable for Xpert Xpress SARS-CoV-2/FLU/RSV testing.  Fact Sheet for Patients: EntrepreneurPulse.com.au  Fact Sheet for Healthcare Providers: IncredibleEmployment.be  This test is not yet approved or cleared by the [place] FDA and has been authorized for detection and/or diagnosis of SARS-CoV-2 by FDA under an Emergency Use Authorization (EUA). This EUA will remain in effect (meaning this test can be used) for the duration of the COVID-19 declaration under Section 564(b)(1) of the Act, 21 U.S.C. section 360bbb-3(b)(1), unless the authorization is terminated or revoked.  Performed at [facility], [place] [address], [place], [place] [address]      Labs: CBC: Recent Labs  Lab 09/06/20 1014 09/06/20 1048 09/07/20 0830 09/08/20 0118 09/10/20 0433 09/11/20 0536  WBC 4.5  --  24.4* 17.7* 8.9  7.8  NEUTROABS 4.0  --  23.4*  --  7.4  --   HGB 15.8 16.7 13.3 11.8* 10.3* 10.2*  HCT 49.0 49.0 42.7 37.3* 31.5* 31.3*  MCV 93.0  --  95.3 94.0 92.1 92.3  PLT 214  --  166 137* 121* 124*  Basic Metabolic Panel: Recent Labs  Lab 09/07/20 0830 09/08/20 0118 09/09/20 0135 09/09/20 0839 09/10/20 0433 09/11/20 0536  NA 140 141 144  --  143 141  K 4.1 4.3 3.4*  --  3.5 3.6  CL 101 102 106  --  106 104  CO2 18* 23 24  --  27 28  GLUCOSE 139* 104* 99  --  144* 132*  BUN 26* 34* 37*  --  26* 18  CREATININE 1.85* 1.84* 1.53*  --  1.26* 1.06  CALCIUM 8.3* 8.0* 8.1*  --  8.0* 8.0*  MG  --   --   --  1.7 2.1  --   PHOS  --   --   --  2.3* 2.2* 3.0    Liver Function Tests: Recent Labs  Lab 09/06/20 1014 09/07/20 0830 09/08/20 0118 09/10/20 0433 09/11/20 0536  AST 38 49* 36 161* 212*  ALT 44 24 22 81* 103*  ALKPHOS 138* 75 67 112 141*  BILITOT 1.5* 1.1 1.0 0.9 0.5  PROT 8.1 6.2* 5.7* 5.4* 5.3*  ALBUMIN 3.4* 2.5* 2.4* 1.9* 1.8*    CBG: Recent Labs  Lab 09/10/20 1655 09/10/20 2111 09/11/20 0730 09/11/20 1149 09/11/20 1700  GLUCAP 136* 131* 118* 134* 126*     Thyroid function studies Recent Labs    09/10/20 0433  TSH 1.567     Urinalysis    Component Value Date/Time   COLORURINE YELLOW 09/06/2020 1825   APPEARANCEUR HAZY (A) 09/06/2020 1825   LABSPEC 1.023 09/06/2020 1825   PHURINE 5.0 09/06/2020 1825   GLUCOSEU NEGATIVE 09/06/2020 1825   [place] Level (A) 09/06/2020 1825   BILIRUBINUR NEGATIVE 09/06/2020 [place] 09/06/2020 1825   PROTEINUR 30 (A) 09/06/2020 1825   UROBILINOGEN 0.2 05/27/2010 2038   NITRITE NEGATIVE 09/06/2020 1825   LEUKOCYTESUR LARGE (A) 09/06/2020 1825    I was able to reach patient's legal guardian, discussed with her in detail via phone, updated care and answered all questions.  Advised her regarding patient's discharge to SNF today and she verbalized understanding.  Time coordinating discharge: 50  minutes  SIGNED:  Vernell Leep, MD, [place], [facility]. [facility]  To contact the attending provider between 7A-7P or the covering provider during after hours 7P-7A, please log into the web site [email or web address] and access using universal [place] password for that web site. If you do not have the password, please call the hospital operator.

## 2020-09-11 NOTE — TOC Transition Note (Signed)
Transition of Care [facility]) - CM/SW Discharge Note   Patient Details  Name: [name] MRN: [id number] Date of Birth: 1945/09/30  Transition of Care [facility]) CM/SW Contact:  [name], [place] Phone Number: 09/11/2020, 5:10 PM   Clinical Narrative:     Pt to discharge to [facility] via ptar. CSW called legal guardian and left message. Pts covid test is negative. [name] will accept pt at anytime according to administer Sonia Side.   Nurse to call PTAR at [phone].  Nurse to call report to [phone].  Final next level of care: Long Term Acute Care (LTAC) Barriers to Discharge: Barriers Resolved   Patient Goals and CMS Choice Patient states their goals for this hospitalization and ongoing recovery are:: unable to verbilize goals      Discharge Placement              Patient chooses bed at: Other - please specify in the comment section below: ([name]) Patient to be transferred to facility by: ptar Name of family member notified: [place] to reach legal guardian Patient and family notified of of transfer: 09/11/20  Discharge Plan and Services                                     Social Determinants of Health (SDOH) Interventions     Readmission Risk Interventions Readmission Risk Prevention Plan 05/15/2019  Post Dischage Appt Not Complete  Appt Comments Pt returning to SNF. SNF MD will follow up at the facility  Medication Screening Complete  Transportation Screening Complete  Some recent data might be hidden    [name], [place], [place] Social Worker [phone]

## 2020-09-13 LAB — CULTURE, BLOOD (ROUTINE X 2)
Culture: NO GROWTH
Culture: NO GROWTH
Special Requests: ADEQUATE

## 2020-09-15 ENCOUNTER — Encounter (HOSPITAL_COMMUNITY): Payer: Self-pay | Admitting: Urology

## 2020-09-15 NOTE — Patient Instructions (Addendum)
Preop instructions for:  [name] Date of Birth:  [birth year]                     Date of Procedure:   09/30/2020 Procedure:   CYSTOSCOPY RIGHT URETEROSCOPY/HOLMIUM LASER/STENT PLACEMENT   Surgeon: Dr. Irine Seal Facility contact: Ardyth Harps     Phone:  [phone]               Health Care POA: RN contact name/phone#:                          and Fax #: [phone]   Transportation contact phone#:     Time to arrive at [facility]: [id number]   Report to: Admitting (On your left hand side)    Do not eat or drink past midnight the night before your procedure.(To include any tube feedings-must be discontinued)   Take these morning medications only with sips of water.(or give through gastrostomy or feeding tube). Atenolol  May use eyedrops the morning of surgeyr   Note: No Insulin or Diabetic meds should be given or taken the morning of the procedure!    Please send day of procedure:current med list and meds last taken that day, confirm nothing by mouth status from what time, Patient Demographic info( to include DNR status, problem list, allergies)   Bring Insurance card and picture ID Leave all jewelry and other valuables at place where living( no metal or rings to be worn) No contact lens  Men-no colognes,lotions   Any questions day of procedure,call  SHORT [phone]     Sent from :[facility]                   Phone:[phone]                   Fax:[phone]   Sent by :   Harlon Flor BSN, RN

## 2020-09-15 NOTE — Progress Notes (Signed)
COVID Vaccine Completed: Date COVID Vaccine completed: COVID vaccine manufacturer: [place]   PCP - Caprice Renshaw, MD Cardiologist -   Chest x-ray - 09/06/20 in epic EKG - 09/06/20 IN EPIC Stress Test -  ECHO - 09/07/20 in epic Cardiac Cath -  Pacemaker/ICD device last checked:  Sleep Study -  CPAP -   Fasting Blood Sugar -  Checks Blood Sugar _____ times a day HgbA1c (5.8) 09/06/20 in epic  Blood Thinner Instructions: Aspirin Instructions: Last Dose:  Activity level:  Unable to go up a flight of stairs without symptoms   Can go up a flight of stairs without stopping and without symptoms   Able to exercise without symptoms     Anesthesia review: EKG poor r wave progression  Patient denies shortness of breath, fever, cough and chest pain at PAT appointment   Patient verbalized understanding of instructions that were given to them at the PAT appointment. Patient was also instructed that they will need to review over the PAT instructions again at home before surgery.

## 2020-09-17 DIAGNOSIS — E11622 Type 2 diabetes mellitus with other skin ulcer: Secondary | ICD-10-CM | POA: Diagnosis not present

## 2020-09-17 DIAGNOSIS — M6281 Muscle weakness (generalized): Secondary | ICD-10-CM | POA: Diagnosis not present

## 2020-09-17 DIAGNOSIS — L89322 Pressure ulcer of left buttock, stage 2: Secondary | ICD-10-CM | POA: Diagnosis not present

## 2020-09-17 DIAGNOSIS — I739 Peripheral vascular disease, unspecified: Secondary | ICD-10-CM | POA: Diagnosis not present

## 2020-09-24 NOTE — Progress Notes (Signed)
Spoke to [place] at [facility] and advised her that we needed patient's records sent over for surgery scheduled for 09-30-20.

## 2020-09-25 ENCOUNTER — Encounter (HOSPITAL_COMMUNITY): Payer: Self-pay | Admitting: Urology

## 2020-09-25 NOTE — Progress Notes (Signed)
OVID Vaccine Completed: Date COVID Vaccine completed: COVID vaccine manufacturer: [place]   PCP - Caprice Renshaw, MD Cardiologist -   Chest x-ray - 09-06-20 in Epic  EKG - 09-06-20 in [facility] - 2008 ECHO - 09-07-20 in Epic Cardiac Cath -  Pacemaker/ICD device last checked:  Sleep Study -  CPAP -   Fasting Blood Sugar -  Checks Blood Sugar _____ times a day  Blood Thinner Instructions: Aspirin Instructions:  ASA 81 mf Last Dose:  Anesthesia review:  Recent respiratory failure & sepsis.  Hx of SOB on exertion and HTN  Pt resides in [facility], unable to obtain records from facility after multiple requests.  Medical history obtained from previous notes.

## 2020-09-25 NOTE — Progress Notes (Signed)
Left message for DON, Sana, at [facility] to again request records on patient.

## 2020-09-30 ENCOUNTER — Ambulatory Visit (HOSPITAL_COMMUNITY)
Admission: RE | Admit: 2020-09-30 | Discharge: 2020-09-30 | Disposition: A | Discharge status: Home / Self Care | Payer: Medicare (Managed Care) | Attending: Urology | Admitting: Urology

## 2020-09-30 ENCOUNTER — Encounter (HOSPITAL_COMMUNITY): Payer: Self-pay | Admitting: Urology

## 2020-09-30 ENCOUNTER — Encounter (HOSPITAL_COMMUNITY)
Admission: RE | Disposition: A | Discharge status: Home / Self Care | Payer: Self-pay | Source: Home / Self Care | Attending: Urology

## 2020-09-30 ENCOUNTER — Ambulatory Visit (HOSPITAL_COMMUNITY): Payer: Medicare (Managed Care) | Admitting: Physician Assistant

## 2020-09-30 ENCOUNTER — Ambulatory Visit (HOSPITAL_COMMUNITY): Payer: Medicare (Managed Care)

## 2020-09-30 DIAGNOSIS — Z20822 Contact with and (suspected) exposure to covid-19: Secondary | ICD-10-CM | POA: Diagnosis not present

## 2020-09-30 DIAGNOSIS — N201 Calculus of ureter: Secondary | ICD-10-CM | POA: Insufficient documentation

## 2020-09-30 DIAGNOSIS — Z87891 Personal history of nicotine dependence: Secondary | ICD-10-CM | POA: Diagnosis not present

## 2020-09-30 DIAGNOSIS — A419 Sepsis, unspecified organism: Secondary | ICD-10-CM | POA: Insufficient documentation

## 2020-09-30 DIAGNOSIS — Z79899 Other long term (current) drug therapy: Secondary | ICD-10-CM | POA: Diagnosis not present

## 2020-09-30 DIAGNOSIS — N390 Urinary tract infection, site not specified: Secondary | ICD-10-CM | POA: Insufficient documentation

## 2020-09-30 HISTORY — DX: Iron deficiency anemia, unspecified: D50.9

## 2020-09-30 HISTORY — DX: Type 2 diabetes mellitus without complications: E11.9

## 2020-09-30 HISTORY — PX: CYSTOSCOPY/URETEROSCOPY/HOLMIUM LASER/STENT PLACEMENT: SHX6546

## 2020-09-30 HISTORY — DX: Gastro-esophageal reflux disease without esophagitis: K21.9

## 2020-09-30 LAB — CBC
HCT: 45.4 % (ref 39.0–52.0)
Hemoglobin: 14 g/dL (ref 13.0–17.0)
MCH: 29.2 pg (ref 26.0–34.0)
MCHC: 30.8 g/dL (ref 30.0–36.0)
MCV: 94.8 fL (ref 80.0–100.0)
Platelets: 186 10*3/uL (ref 150–400)
RBC: 4.79 MIL/uL (ref 4.22–5.81)
RDW: 14 % (ref 11.5–15.5)
WBC: 10.3 10*3/uL (ref 4.0–10.5)
nRBC: 0 % (ref 0.0–0.2)

## 2020-09-30 LAB — BASIC METABOLIC PANEL
Anion gap: 12 (ref 5–15)
BUN: 24 mg/dL — ABNORMAL HIGH (ref 8–23)
CO2: 26 mmol/L (ref 22–32)
Calcium: 9 mg/dL (ref 8.9–10.3)
Chloride: 102 mmol/L (ref 98–111)
Creatinine, Ser: 0.99 mg/dL (ref 0.61–1.24)
GFR, Estimated: >60 mL/min (ref >60)
Glucose, Bld: 109 mg/dL — ABNORMAL HIGH (ref 70–99)
Potassium: 4.7 mmol/L (ref 3.5–5.1)
Sodium: 140 mmol/L (ref 135–145)

## 2020-09-30 LAB — SARS CORONAVIRUS 2 BY RT PCR (HOSPITAL ORDER, PERFORMED IN ~~LOC~~ HOSPITAL LAB): SARS Coronavirus 2: NEGATIVE

## 2020-09-30 LAB — GLUCOSE, CAPILLARY
Glucose-Capillary: 110 mg/dL — ABNORMAL HIGH (ref 70–99)
Glucose-Capillary: 106 mg/dL — ABNORMAL HIGH (ref 70–99)

## 2020-09-30 SURGERY — CYSTOSCOPY/URETEROSCOPY/HOLMIUM LASER/STENT PLACEMENT
Anesthesia: General | Site: Ureter | Laterality: Right

## 2020-09-30 MED ORDER — FENTANYL CITRATE (PF) 100 MCG/2ML IJ SOLN: 25.0000 ug | INTRAMUSCULAR | Status: DC | PRN

## 2020-09-30 MED ORDER — SODIUM CHLORIDE 0.9% FLUSH: 3.0000 mL | Freq: Two times a day (BID) | INTRAVENOUS | Status: DC

## 2020-09-30 MED ORDER — DEXAMETHASONE SODIUM PHOSPHATE 10 MG/ML IJ SOLN
INTRAMUSCULAR | Status: DC | PRN
  Administered 2020-09-30: 4 mg via INTRAVENOUS

## 2020-09-30 MED ORDER — ONDANSETRON HCL 4 MG/2ML IJ SOLN: 4.0000 mg | Freq: Once | INTRAMUSCULAR | Status: DC | PRN

## 2020-09-30 MED ORDER — ORAL CARE MOUTH RINSE: 15.0000 mL | Freq: Once | OROMUCOSAL | Status: DC

## 2020-09-30 MED ORDER — ONDANSETRON HCL 4 MG/2ML IJ SOLN
INTRAMUSCULAR | Status: DC | PRN
  Administered 2020-09-30: 4 mg via INTRAVENOUS

## 2020-09-30 MED ORDER — FLUCONAZOLE 100 MG PO TABS: 100.0000 mg | ORAL_TABLET | Freq: Every day | ORAL | 0 refills | Status: AC

## 2020-09-30 MED ORDER — LACTATED RINGERS IV SOLN: INTRAVENOUS | Status: DC

## 2020-09-30 MED ORDER — FENTANYL CITRATE (PF) 100 MCG/2ML IJ SOLN
INTRAMUSCULAR | Status: DC | PRN
  Administered 2020-09-30 (×2): 25 ug via INTRAVENOUS

## 2020-09-30 MED ORDER — CHLORHEXIDINE GLUCONATE 0.12 % MT SOLN: 15.0000 mL | Freq: Once | OROMUCOSAL | Status: DC

## 2020-09-30 MED ORDER — ACETAMINOPHEN 10 MG/ML IV SOLN: 1000.0000 mg | Freq: Once | INTRAVENOUS | Status: DC | PRN

## 2020-09-30 MED ORDER — PROPOFOL 10 MG/ML IV BOLUS
INTRAVENOUS | Status: DC | PRN
  Administered 2020-09-30: 100 mg via INTRAVENOUS

## 2020-09-30 MED ORDER — SODIUM CHLORIDE 0.9 % IR SOLN
Status: DC | PRN
  Administered 2020-09-30: 3000 mL

## 2020-09-30 MED ORDER — ONDANSETRON HCL 4 MG/2ML IJ SOLN
INTRAMUSCULAR | Status: AC
  Filled 2020-09-30: qty 2

## 2020-09-30 MED ORDER — CEFAZOLIN SODIUM-DEXTROSE 2-4 GM/100ML-% IV SOLN
2.0000 g | INTRAVENOUS | Status: AC
  Administered 2020-09-30: 2 g via INTRAVENOUS
  Filled 2020-09-30: qty 100

## 2020-09-30 MED ORDER — DEXAMETHASONE SODIUM PHOSPHATE 10 MG/ML IJ SOLN
INTRAMUSCULAR | Status: AC
  Filled 2020-09-30: qty 1

## 2020-09-30 MED ORDER — FLUCONAZOLE IN SODIUM CHLORIDE 400-0.9 MG/200ML-% IV SOLN
400.0000 mg | INTRAVENOUS | Status: AC
  Administered 2020-09-30: 400 mg via INTRAVENOUS
  Filled 2020-09-30: qty 200

## 2020-09-30 MED ORDER — GENTAMICIN SULFATE 40 MG/ML IJ SOLN
5.0000 mg/kg | Freq: Once | INTRAVENOUS | Status: AC
  Administered 2020-09-30: 400 mg via INTRAVENOUS
  Filled 2020-09-30: qty 10

## 2020-09-30 MED ORDER — FENTANYL CITRATE (PF) 100 MCG/2ML IJ SOLN
INTRAMUSCULAR | Status: AC
  Filled 2020-09-30: qty 2

## 2020-09-30 MED ORDER — LIDOCAINE HCL (PF) 2 % IJ SOLN
INTRAMUSCULAR | Status: AC
  Filled 2020-09-30: qty 5

## 2020-09-30 MED ORDER — OXYCODONE-ACETAMINOPHEN 5-325 MG PO TABS: 1.0000 | ORAL_TABLET | Freq: Four times a day (QID) | ORAL | 0 refills | Status: DC | PRN

## 2020-09-30 MED ORDER — LIDOCAINE 2% (20 MG/ML) 5 ML SYRINGE
INTRAMUSCULAR | Status: DC | PRN
  Administered 2020-09-30: 60 mg via INTRAVENOUS

## 2020-09-30 MED ORDER — CEPHALEXIN 500 MG PO CAPS: 500.0000 mg | ORAL_CAPSULE | Freq: Four times a day (QID) | ORAL | 0 refills | Status: DC

## 2020-09-30 MED ORDER — PROPOFOL 10 MG/ML IV BOLUS
INTRAVENOUS | Status: AC
  Filled 2020-09-30: qty 20

## 2020-09-30 SURGICAL SUPPLY — 22 items
BAG URO CATCHER STRL LF (MISCELLANEOUS) ×2 IMPLANT
BASKET STONE NCOMPASS (UROLOGICAL SUPPLIES) ×2 IMPLANT
CATH URET 5FR 28IN OPEN ENDED (CATHETERS) IMPLANT
CATH URET DUAL LUMEN 6-10FR 50 (CATHETERS) IMPLANT
CLOTH BEACON ORANGE TIMEOUT ST (SAFETY) ×2 IMPLANT
EXTRACTOR STONE NITINOL NGAGE (UROLOGICAL SUPPLIES) ×2 IMPLANT
GLOVE SURG SS PI 8.0 STRL IVOR (GLOVE) ×2 IMPLANT
GOWN STRL REUS W/TWL XL LVL3 (GOWN DISPOSABLE) ×2 IMPLANT
GUIDEWIRE STR DUAL SENSOR (WIRE) ×2 IMPLANT
IV NS IRRIG 3000ML ARTHROMATIC (IV SOLUTION) ×2 IMPLANT
KIT TURNOVER KIT A (KITS) IMPLANT
LASER FIB FLEXIVA PULSE ID 365 (Laser) IMPLANT
LASER FIB FLEXIVA PULSE ID 550 (Laser) IMPLANT
LASER FIB FLEXIVA PULSE ID 910 (Laser) IMPLANT
MANIFOLD NEPTUNE II (INSTRUMENTS) ×2 IMPLANT
PACK CYSTO (CUSTOM PROCEDURE TRAY) ×2 IMPLANT
SHEATH URETERAL 12FRX35CM (MISCELLANEOUS) ×2 IMPLANT
STENT URET 6FRX24 CONTOUR (STENTS) ×2 IMPLANT
TRACTIP FLEXIVA PULS ID 200XHI (Laser) ×1 IMPLANT
TRACTIP FLEXIVA PULSE ID 200 (Laser) ×2
TUBING CONNECTING 10 (TUBING) ×2 IMPLANT
TUBING UROLOGY SET (TUBING) ×2 IMPLANT

## 2020-09-30 NOTE — Op Note (Signed)
Procedure: 1.  Cystoscopy with removal of right double-J stent. 2.  Right ureteroscopy with stone extraction, holmium laser lithotripsy and placement of right double-J stent. 3.  Application of fluoroscopy.  Preop diagnosis: 7 mm right proximal stone with history of sepsis.  Postop diagnosis: Same.  Surgeon: Dr. Irine Seal.  Anesthesia: General.  Specimen: Stone fragments and urine for culture.  Drains: 6 French by 26 cm right contour double-J stent with tether.  EBL: None.  Complications: None.  Indications: The patient is a 75 year old male who was admitted to the hospital in mid December with a 7 mm right proximal stone and sepsis.  He underwent stenting at that time and returns now for stone management.  Procedure: He was given 2 g of Ancef.  A general anesthetic was induced.  He was placed in lithotomy position and fitted with PAS hose.  His perineum and genitalia were prepped with Betadine solution and he was draped in the usual sterile fashion.  Cystoscopy was performed using a 23 [place] scope and 30 degree lens.  Examination demonstrated a normal urethra.  The external sphincter was intact.  The prostatic urethra was approximately 2 to 3 cm in length with bilobar hyperplasia with some obstruction.  Examination of bladder revealed some turbid urine with encrustation of the distal stent loop and some inflammatory changes of the bladder wall were felt to be related to the encrusted stent.  I did obtain a urine culture because of the turbid urine and requested gentamicin to broaden the antibiotic coverage since he had been on oral Keflex preoperatively.  He had a few small cellules and diverticuli and moderate trabeculation.  The left ureteral orifice was not clearly visualized.  The right ureteral orifice had the stent was noted.  The stent was grasped with a grasping forceps and pulled the urethral meatus.  A sensor wire was then passed through the stent into the ureter but I was  unable to pass it beyond the lower proximal ureter.  The stent was then removed and a 5 French opening cath was advanced over the wire which aided passage into the renal collecting system.  The open-ended catheter was removed.  A 12/14 French 35 cm digital access sheath inner core was advanced over the wire without difficulty into the proximal ureter.  This was followed by the assembled sheath.  The inner core and wire were then removed.  A dual-lumen digital ureteroscope was then passed through the sheath into the ureter and the stone was identified in the proximal ureter.It had a tannish appearance was only faintly opaque on fluoroscopy suggesting uric acid.  The stone was then engaged with a 200 m laser fiber set on 0.2 J and 70 Hz.  The stone readily fragmented.  The fragments were then removed using an engage basket followed by an encompass basket.  Once all significant fragments were removed the scope was advanced into the renal collecting system and surprisingly there noted some whitish debris which had not been effluxing out during the procedure but gave me some concern that he might have some yeast in the collecting system.  At this point I requested fluconazole dosing per pharmacy to be on the safe side.  Ureteroscope was then removed and a sensor wire was advanced back into the kidney and the sheath was removed.  The cystoscope was reinserted over the wire and the 6 [place] by 26 cm contour double-J stent was then advanced over the wire under fluoroscopic guidance.  The wire was  removed, leaving good coil in the kidney and a good coil in the bladder.  Bladder was drained and the cystoscope was removed leaving the stent string exiting urethra.  The string was secured to the patient's penis.  He was taken down from lithotomy position, his anesthetic was reversed and he was moved recovery in stable condition.

## 2020-09-30 NOTE — Discharge Instructions (Signed)
Ureteral Stent Implantation, Care After °This sheet gives you information about how to care for yourself after your procedure. Your health care provider may also give you more specific instructions. If you have problems or questions, contact your health care provider. °What can I expect after the procedure? °After the procedure, it is common to have: °· Nausea. °· Mild pain when you urinate. You may feel this pain in your lower back or lower abdomen. The pain should stop within a few minutes after you urinate. This may last for up to 1 week. °· A small amount of blood in your urine for several days. °Follow these instructions at home: °Medicines °· Take over-the-counter and prescription medicines only as told by your health care provider. °· If you were prescribed an antibiotic medicine, take it as told by your health care provider. Do not stop taking the antibiotic even if you start to feel better. °· Do not drive for 24 hours if you were given a sedative during your procedure. °· Ask your health care provider if the medicine prescribed to you requires you to avoid driving or using heavy machinery. °Activity °· Rest as told by your health care provider. °· Avoid sitting for a long time without moving. Get up to take short walks every 1-2 hours. This is important to improve blood flow and breathing. Ask for help if you feel weak or unsteady. °· Return to your normal activities as told by your health care provider. Ask your health care provider what activities are safe for you. °General instructions ° °· Watch for any blood in your urine. Call your health care provider if the amount of blood in your urine increases. °· If you have a catheter: °? Follow instructions from your health care provider about taking care of your catheter and collection bag. °? Do not take baths, swim, or use a hot tub until your health care provider approves. Ask your health care provider if you may take showers. You may only be allowed to  take sponge baths. °· Drink enough fluid to keep your urine pale yellow. °· Do not use any products that contain nicotine or tobacco, such as cigarettes, e-cigarettes, and chewing tobacco. These can delay healing after surgery. If you need help quitting, ask your health care provider. °· Keep all follow-up visits as told by your health care provider. This is important. °Contact a health care provider if: °· You have pain that gets worse or does not get better with medicine, especially pain when you urinate. °· You have difficulty urinating. °· You feel nauseous or you vomit repeatedly during a period of more than 2 days after the procedure. °Get help right away if: °· Your urine is dark red or has blood clots in it. °· You are leaking urine (have incontinence). °· The end of the stent comes out of your urethra. °· You cannot urinate. °· You have sudden, sharp, or severe pain in your abdomen or lower back. °· You have a fever. °· You have swelling or pain in your legs. °· You have difficulty breathing. °Summary °· After the procedure, it is common to have mild pain when you urinate that goes away within a few minutes after you urinate. This may last for up to 1 week. °· Watch for any blood in your urine. Call your health care provider if the amount of blood in your urine increases. °· Take over-the-counter and prescription medicines only as told by your health care provider. °· Drink   enough fluid to keep your urine pale yellow.    You may remove the stent by pulling the string coming from the penis on Friday morning.  If you don't feel you can do this, please call the office to set up a time to have it removed.   If the stent gets pulled out early, just let us know if he has pain or fever.   This information is not intended to replace advice given to you by your health care provider. Make sure you discuss any questions you have with your health care provider. Document Revised: 06/20/2018 Document Reviewed:  06/21/2018 Elsevier Patient Education  2020 [employer].

## 2020-09-30 NOTE — Progress Notes (Signed)
After multiple attempts , Finally spoke with [name] (legal guardian ). Legal guardian was not informed that the patient was going to have surgery today ; she stated that there is a process and will need to speak to her supervisor and Dr. Jeffie Pollock. MD aware and will call her

## 2020-09-30 NOTE — Progress Notes (Signed)
Phase II, Pt has not voided yet 2 hours post surgery, bladder scan showing 0 ml. Dr. Cletus Gash made aware. Will attempt another bladder scan with deferent machine.

## 2020-09-30 NOTE — Anesthesia Preprocedure Evaluation (Addendum)
Anesthesia Evaluation  Patient identified by MRN, date of birth, ID bandGeneral Assessment Comment:Patient responds to verbal stimuli  Reviewed: Allergy & Precautions, NPO status , Patient's Chart, lab work & pertinent test results  Airway Mallampati: III  TM Distance: >3 FB Neck ROM: Full    Dental  (+) Edentulous Upper, Edentulous Lower   Pulmonary former smoker,    Pulmonary exam normal breath sounds clear to auscultation       Cardiovascular hypertension, Pt. on home beta blockers Normal cardiovascular exam Rhythm:Regular Rate:Normal  ECG: rate 134. Sinus tachycardia Left anterior fascicular block  ECHO: 1. Limited windows for image acquisition and forshortened images limit accurate assessment of LV function but grossly appears normal. 2. Left ventricular ejection fraction, by estimation, is 55 to 60%. The left ventricle has normal function. Left ventricular endocardial border not optimally defined to evaluate regional wall motion. Left ventricular diastolic function could not be evaluated. 3. Right ventricular systolic function was not well visualized. The right ventricular size is not well visualized. Tricuspid regurgitation signal is inadequate for assessing PA pressure. 4. The mitral valve is normal in structure. No evidence of mitral valve regurgitation. No evidence of mitral stenosis. 5. The aortic valve is normal in structure. Aortic valve regurgitation is not visualized. No aortic stenosis is present. 6. The inferior vena cava is normal in size with greater than 50% respiratory variability, suggesting right atrial pressure of 3 mmHg.   Neuro/Psych PSYCHIATRIC DISORDERS Dementia  Neuromuscular disease    GI/Hepatic negative GI ROS, Neg liver ROS,   Endo/Other  diabetes, Oral Hypoglycemic Agents  Renal/GU Renal disease     Musculoskeletal negative musculoskeletal ROS (+)   Abdominal (+) + obese,   Peds   Hematology HLD   Anesthesia Other Findings right proximal stone  Reproductive/Obstetrics                           Anesthesia Physical Anesthesia Plan  ASA: III  Anesthesia Plan: General   Post-op Pain Management:    Induction: Intravenous  PONV Risk Score and Plan: 2 and Ondansetron, Dexamethasone and Treatment may vary due to age or medical condition  Airway Management Planned: LMA  Additional Equipment:   Intra-op Plan:   Post-operative Plan: Extubation in OR  Informed Consent: I have reviewed the patients History and Physical, chart, labs and discussed the procedure including the risks, benefits and alternatives for the proposed anesthesia with the patient or authorized representative who has indicated his/her understanding and acceptance.     Consent reviewed with POA  Plan Discussed with: CRNA  Anesthesia Plan Comments:         Anesthesia Quick Evaluation

## 2020-09-30 NOTE — Anesthesia Postprocedure Evaluation (Signed)
Anesthesia Post Note  Patient: [name]  Procedure(s) Performed: CYSTOSCOPY RIGHT URETEROSCOPY/HOLMIUM LASER/STENT PLACEMENT (Right Ureter)     Patient location during evaluation: PACU Anesthesia Type: General Level of consciousness: awake Pain management: pain level controlled Vital Signs Assessment: post-procedure vital signs reviewed and stable Respiratory status: spontaneous breathing, nonlabored ventilation, respiratory function stable and patient connected to nasal cannula oxygen Cardiovascular status: blood pressure returned to baseline and stable Postop Assessment: no apparent nausea or vomiting Anesthetic complications: no   No complications documented.  Last Vitals:  Vitals:   09/30/20 1500 09/30/20 1600  BP: 121/67 127/77  Pulse: 72 78  Resp: 15 14  Temp:  36.5 C  SpO2: 97% 100%    Last Pain:  Vitals:   09/30/20 1600  TempSrc:   PainSc: 0-No pain                 Brant Peets P Page [place]

## 2020-09-30 NOTE — Transfer of Care (Signed)
Immediate Anesthesia Transfer of Care Note  Patient: [name]  Procedure(s) Performed: CYSTOSCOPY RIGHT URETEROSCOPY/HOLMIUM LASER/STENT PLACEMENT (Right Ureter)  Patient Location: PACU  Anesthesia Type:General  Level of Consciousness: awake and patient cooperative  Airway & Oxygen Therapy: Patient Spontanous Breathing and Patient connected to face mask oxygen  Post-op Assessment: Report given to RN, Post -op Vital signs reviewed and stable and Patient moving all extremities X 4  Post vital signs: stable  Last Vitals:  Vitals Value Taken Time  BP 119/82 09/30/20 1237  Temp    Pulse 85 09/30/20 1243  Resp 11 09/30/20 1243  SpO2 100 % 09/30/20 1243  Vitals shown include unvalidated device data.  Last Pain:  Vitals:   09/30/20 0937  TempSrc: Oral         Complications: No complications documented.

## 2020-09-30 NOTE — Progress Notes (Signed)
Dr. Jeffie Pollock spoke to Fuller [place] ([place] Director for adult protective services) and gave verbal consent over the phone

## 2020-09-30 NOTE — Anesthesia Procedure Notes (Signed)
Procedure Name: LMA Insertion Performed by: Milford Cage, CRNA Pre-anesthesia Checklist: Patient identified, Emergency Drugs available, Suction available and Patient being monitored Patient Re-evaluated:Patient Re-evaluated prior to induction Oxygen Delivery Method: Circle System Utilized Preoxygenation: Pre-oxygenation with 100% oxygen Induction Type: IV induction Ventilation: Mask ventilation without difficulty LMA: LMA inserted and LMA with gastric port inserted LMA Size: 4.0 Number of attempts: 1 Airway Equipment and Method: Bite block Placement Confirmation: positive ETCO2 Tube secured with: Tape Dental Injury: Teeth and Oropharynx as per pre-operative assessment

## 2020-09-30 NOTE — Interval H&P Note (Signed)
History and Physical Interval Note:  Pt is doing well.  Consent obtained from his guardian.   09/30/2020 11:16 AM  [name]  has presented today for surgery, with the diagnosis of right proximal stone.  The various methods of treatment have been discussed with the patient and family. After consideration of risks, benefits and other options for treatment, the patient has consented to  Procedure(s): CYSTOSCOPY RIGHT URETEROSCOPY/HOLMIUM LASER/STENT PLACEMENT (Right) as a surgical intervention.  The patient's history has been reviewed, patient examined, no change in status, stable for surgery.  I have reviewed the patient's chart and labs.  Questions were answered to the patient's satisfaction.     Irine Seal

## 2020-09-30 NOTE — Progress Notes (Signed)
Second bladder scan attempted showing 150 ml urine. Pt in no distress, VS WDL. Dr Jeffie Pollock made aware. Instructed to discharge pt back to the facility with instructions , and to monitor pt for void trial at the facility.   Will call [facility] facility for report.

## 2020-10-01 ENCOUNTER — Encounter (HOSPITAL_COMMUNITY): Payer: Self-pay | Admitting: Urology

## 2020-10-02 LAB — URINE CULTURE: Culture: >=100000 — AB

## 2020-10-09 ENCOUNTER — Encounter: Payer: Self-pay | Admitting: Urology

## 2020-10-09 ENCOUNTER — Telehealth: Payer: Self-pay

## 2020-10-09 ENCOUNTER — Ambulatory Visit (INDEPENDENT_AMBULATORY_CARE_PROVIDER_SITE_OTHER): Payer: Medicare (Managed Care) | Admitting: Urology

## 2020-10-09 ENCOUNTER — Other Ambulatory Visit: Payer: Self-pay

## 2020-10-09 VITALS — BP 122/77 | HR 86 | Temp 98.1°F | Ht 68.0 in | Wt 215.0 lb

## 2020-10-09 DIAGNOSIS — E790 Hyperuricemia without signs of inflammatory arthritis and tophaceous disease: Secondary | ICD-10-CM | POA: Diagnosis not present

## 2020-10-09 DIAGNOSIS — Z87440 Personal history of urinary (tract) infections: Secondary | ICD-10-CM | POA: Diagnosis not present

## 2020-10-09 DIAGNOSIS — N201 Calculus of ureter: Secondary | ICD-10-CM | POA: Diagnosis not present

## 2020-10-09 NOTE — Progress Notes (Addendum)
Subjective:  1. Personal history of urinary infection   2. Ureteral stone   3. Hyperuricemia      [name] returns today in f/u from his recent right ureteroscopy following stenting for sepsis with an obstructing stone.  His stone was primarily uric acid.  His stent has been removed.  He has no complaints but is unable to give a history.    I received the recent Uric acid level through Urochart after he left today.  The level is elevated at 8.8.   I have request that his PCP be notified so that he can initiate treatment for hyperuricemia.  ROS:  ROS:  A complete review of systems was performed.  All systems are negative except for pertinent findings as noted.   ROS  Allergies  Allergen Reactions  . Hydrocodone   . Hydrocodone-Acetaminophen     unknown  . Aplisol [Tuberculin Ppd] Rash    Outpatient Encounter Medications as of 10/09/2020  Medication Sig  . acetaminophen (TYLENOL) 650 MG CR tablet Take 1 tablet (650 mg total) by mouth every 8 (eight) hours as needed for pain or fever.  Marland Kitchen atenolol (TENORMIN) 25 MG tablet Take 25 mg by mouth daily.   Marland Kitchen atorvastatin (LIPITOR) 10 MG tablet Take 5 mg by mouth at bedtime.   . carboxymethylcellulose (REFRESH PLUS) 0.5 % SOLN Place 1 drop into both eyes every 6 (six) hours as needed (Dry eye).   . cephALEXin (KEFLEX) 500 MG capsule Take 1 capsule (500 mg total) by mouth 4 (four) times daily.  . cholecalciferol (VITAMIN D3) 25 MCG (1000 UNIT) tablet Take 1,000 Units by mouth daily.  . Colloidal Oatmeal (EUCERIN ECZEMA RELIEF) 1 % CREA Apply 1 application topically 2 (two) times daily. Apply to face and neck  . cycloSPORINE (RESTASIS) 0.05 % ophthalmic emulsion Place 1 drop into both eyes 2 (two) times daily.  . feeding supplement (ENSURE ENLIVE / ENSURE PLUS) LIQD Take 237 mLs by mouth 2 (two) times daily between meals.  . ferrous sulfate 325 (65 FE) MG EC tablet Take 325 mg by mouth daily at 12 noon.  . furosemide (LASIX) 40 MG tablet Take  40 mg by mouth daily.  Marland Kitchen ketoconazole (NIZORAL) 2 % shampoo Apply 1 application topically every 12 (twelve) hours as needed for irritation. Apply to Scalp  . metFORMIN (GLUCOPHAGE) 500 MG tablet Take 500 mg by mouth at bedtime.  . mometasone (ELOCON) 0.1 % cream Apply 1 application topically daily as needed (Dry skin).   . Multiple Vitamin (MULTIVITAMIN) tablet Take 1 tablet by mouth daily.  . Omega-3 Fatty Acids (FISH OIL) 1000 MG CAPS Take 2,000 mg by mouth 2 (two) times daily.   Marland Kitchen oxyCODONE-acetaminophen (PERCOCET) 5-325 MG tablet Take 1 tablet by mouth every 6 (six) hours as needed for severe pain.  . potassium chloride (KLOR-CON) 10 MEQ tablet Take 20 mEq by mouth 2 (two) times daily.  . potassium chloride (KLOR-CON) 20 MEQ packet Take 20 mEq by mouth 2 (two) times daily.   . Skin Protectants, Misc. (EUCERIN) cream Apply 1 application topically daily. Apply to feet  . vitamin B-12 (CYANOCOBALAMIN) 1000 MCG tablet Take 1,000 mcg by mouth daily.  Marland Kitchen omega-3 acid ethyl esters (LOVAZA) 1 g capsule Take 2 capsules by mouth 2 (two) times daily.  . potassium chloride SA (KLOR-CON) 20 MEQ tablet    No facility-administered encounter medications on file as of 10/09/2020.    Past Medical History:  Diagnosis Date  . Bell's palsy   .  Bell's palsy   . Bleeding ulcer    remote past  . 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 Dementia    disoriented to time.does not know medical hx.information obtained from previous charts  . Diabetes mellitus without complication ([place])   . GERD (gastroesophageal reflux disease)   . Hard of hearing   . Hyperlipemia   . Hypertension   . Iron deficiency anemia   . Obesity   . SOB (shortness of breath) on exertion     Past Surgical History:  Procedure Laterality Date  . BIOPSY  05/09/2019   Procedure: BIOPSY;  Surgeon: Daneil Dolin, MD;  [facility];  Service: Endoscopy;;  . COLONOSCOPY  10/06/06   internal hemorrhoids and anal papilla/poor  prep  . 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 CYSTOSCOPY W/ URETERAL STENT PLACEMENT Right 09/08/2020   Procedure: CYSTOSCOPY WITH RETROGRADE PYELOGRAM/URETERAL STENT PLACEMENT;  Surgeon: Irine Seal, MD;  Location: [facility];  Service: Urology;  Laterality: Right;  . CYSTOSCOPY/URETEROSCOPY/HOLMIUM LASER/STENT PLACEMENT Right 09/30/2020   Procedure: CYSTOSCOPY RIGHT URETEROSCOPY/HOLMIUM LASER/STENT PLACEMENT;  Surgeon: Irine Seal, MD;  Location: [facility];  Service: Urology;  Laterality: Right;  . ESOPHAGOGASTRODUODENOSCOPY N/A 05/01/2018   multiple 3-6 mm gastric nodules and gastritis, surrounding telangectasias. Biopsy with atrophic gastritis.   Marland Kitchen FOOT SURGERY    . KNEE ARTHROSCOPY  06/17/2011   Procedure: ARTHROSCOPY KNEE;  Surgeon: Sanjuana Kava;  Location: [facility];  Service: Orthopedics;  Laterality: Right;  . LAPAROSCOPIC PARTIAL COLECTOMY N/A 05/11/2019   Procedure: ATTEMPTED LAPAROSCOPIC PARTIAL COLECTOMY;  Surgeon: [name], MD;  Location: [facility];  Service: General;  Laterality: N/A;  . PARTIAL COLECTOMY  05/11/2019   Procedure: PARTIAL COLECTOMY (Laparotomy incision at 3419;  Surgeon: [name], MD;  Location: [facility];  Service: General;;  . POLYPECTOMY  05/09/2019   Procedure: POLYPECTOMY;  Surgeon: Daneil Dolin, MD;  [facility];  Service: Endoscopy;;    Social History   Socioeconomic History  . Marital status: Single    Spouse name: Not on file  . Number of children: Not on file  . Years of education: Not on file  . Highest education level: Not on file  Occupational History  . Not on file  Tobacco Use  . Smoking status: Former Smoker    Packs/day: 1.00    Years: 30.00    Pack years: 30.00    Types: Cigarettes  . Smokeless tobacco: Never Used  . Tobacco comment: quit a "long time ago"   Vaping Use  . Vaping Use: Never used  Substance and Sexual Activity  . Alcohol use: No  . Drug use: No  . Sexual activity: Never   Other Topics Concern  . Not on file  Social History Narrative  . Not on file   Social Determinants of Health   Financial Resource Strain: Not on file  Food Insecurity: Not on file  Transportation Needs: Not on file  Physical Activity: Not on file  Stress: Not on file  Social Connections: Not on file  Intimate Partner Violence: Not on file    Family History  Problem Relation Age of Onset  . COPD Mother   . Cancer Brother   . Anesthesia problems Neg Hx   . Hypotension Neg Hx   . Malignant hyperthermia Neg Hx   . Pseudochol deficiency Neg Hx   . Colon cancer Neg Hx  Objective: Vitals:   10/09/20 1207  BP: 122/77  Pulse: 86  Temp: 98.1 F (36.7 C)     Physical Exam  Lab Results:  PSA No results found for: PSA No results found for: TESTOSTERONE    Studies/Results: No results found. No results found for this or any previous visit.  No results found for this or any previous visit.  No results found for this or any previous visit.  No results found for this or any previous visit.  No results found for this or any previous visit.  No results found for this or any previous visit.  No results found for this or any previous visit.  No results found for this or any previous visit.    Assessment & Plan: Ureteral stone with sepsis:  Doing well s/p recent ureteroscopy.   Stent is out.  I will have him get a renal US in 4-6 weeks and if that shows no hydro, he will return in 6 months with a renal US.     Hyperuricemia.   I will have the PCP notified so that he can initiate therapy for the elevated uric acid level of 8.8 to try to prevent further stones.   No orders of the defined types were placed in this encounter.    Orders Placed This Encounter  Procedures  . US RENAL    Standing Status:   Future    Standing Expiration Date:   12/07/2020    Order Specific Question:   Reason for Exam (SYMPTOM  OR DIAGNOSIS REQUIRED)    Answer:    nephrolithiasis    Order Specific Question:   Preferred imaging location?    Answer:   [facility]  . US RENAL    Standing Status:   Future    Standing Expiration Date:   10/09/2021    Order Specific Question:   Reason for Exam (SYMPTOM  OR DIAGNOSIS REQUIRED)    Answer:   nephrolithiasis    Order Specific Question:   Preferred imaging location?    Answer:   [facility]      Return in about 6 months (around 04/08/2021) for Renal US in 6 wks.  f/u with renal US in 6 months. .   CC: Caprice Renshaw, MD      Irine Seal 10/09/2020

## 2020-10-09 NOTE — Telephone Encounter (Signed)
-----   Message from Irine Seal, MD sent at 10/09/2020  1:55 PM EST ----- He had Uric acid level done at my request because his stone was uric acid.   He  needs to be treated for that but I would prefer it be done by his primary care doctor.   The level was 8.8.   Please let his PCP know that he will need treatment for the elevated uric acid.

## 2020-10-09 NOTE — Telephone Encounter (Signed)
I called the pts nurse and [name] and relayed message from Dr. Jeffie Pollock to have pcp treat pt for Uric acid.

## 2020-10-14 ENCOUNTER — Ambulatory Visit: Payer: Medicare (Managed Care) | Admitting: Gastroenterology

## 2020-10-15 ENCOUNTER — Encounter: Payer: Self-pay | Admitting: Internal Medicine

## 2020-11-19 ENCOUNTER — Ambulatory Visit (INDEPENDENT_AMBULATORY_CARE_PROVIDER_SITE_OTHER): Payer: Medicare (Managed Care) | Admitting: Neurology

## 2020-11-19 ENCOUNTER — Ambulatory Visit: Payer: Medicaid Other | Admitting: Neurology

## 2020-11-19 ENCOUNTER — Encounter: Payer: Self-pay | Admitting: Neurology

## 2020-11-19 VITALS — BP 116/61 | HR 81 | Ht 72.0 in | Wt 223.8 lb

## 2020-11-19 DIAGNOSIS — F0390 Unspecified dementia without behavioral disturbance: Secondary | ICD-10-CM

## 2020-11-19 DIAGNOSIS — I6381 Other cerebral infarction due to occlusion or stenosis of small artery: Secondary | ICD-10-CM

## 2020-11-19 MED ORDER — ASPIRIN EC 81 MG PO TBEC: 81.0000 mg | DELAYED_RELEASE_TABLET | Freq: Every day | ORAL | 11 refills | Status: DC

## 2020-11-19 NOTE — Patient Instructions (Signed)
I had a long discussion with the patient regarding his recent hospitalization and MRI scan showing possibly a small pontine stroke likely from small vessel disease but being unrelated to his clinical presentation at that time which was related to encephalopathy from sepsis related to Klebsiella pneumonia.  Given his advanced age of dementia and very limited lifestyle I agree with not doing any further stroke work-up and continuing aspirin 81 mg daily for stroke prevention and maintaining aggressive risk factor modification with strict control of hypertension and blood pressure goal below 130/90, lipids with LDL cholesterol goal below 70 mg percent and diabetes with hemoglobin A1c goal below 6.5%.  No need for further  scheduled follow-up appointment but he may be referred back in the future as needed only.

## 2020-11-19 NOTE — Progress Notes (Signed)
[facility] [address] [place]. [place] [address] [phone]       OFFICE CONSULT NOTE  [name] Date of Birth:  Mar 20, 1946 Medical Record Number:  [id number]   Referring UV:OZDGU Hongalgi    Reason for Referral: Stroke  HPI: [name] is a 52 Caucasian male with past medical history of dementia, hypertension, hyperlipidemia, Bell's palsy, anemia, obesity seen today for initial office consultation visit for stroke.  Patient is unable to provide meaningful history which is obtained through review of electronic medical records.  He is accompanied today by her transporter from the nursing home where he lives at. Patient has multiple medical problems and dementia and lives in a nursing home.  He was apparently fine on 09/06/2020 at 7:30 AM and subsequently was noted to be unresponsive with some concern for decorticate posturing at 8:20 AM.  He was brought in by EMS also noticed some slurred speech but his symptoms improved by the time he arrived.  And seen by Dr. Lorrin Goodell neuro hospitalist via televisit patient had movement in all 4 extremities to command but seem to be encephalopathy and confused.  He was subsequently hospitalized at [facility] and found to have sepsis with Klebsiella pneumonia.  His rectal temperature was 105.1.  He subsequently underwent cystoscopy and required a right ureteral stent on 09/08/2020.  Patient was unable to cooperate for a complete MRI but underwent only DWI and ADC map to images which showed questionable left paramedian pontine infarct.  Patient's clinical symptoms clearly did not correlate with this finding.  CT angiogram was obtained which showed no significant large vessel stenosis or occlusion.  Echocardiogram showed ejection fraction of 55 to 60% without cardiac source of embolism.  Patient was continued on aspirin and was treated with antibiotics.  Patient apparently is back to his baseline is living in a nursing home he  requires total care he is nonambulatory.  He is pleasantly demented at baseline can carry out a conversation but is disoriented.  Today he has no neurological complaints and is sitting in a wheelchair chatting and able to move extremities against gravity. ROS:   14 system review of systems is positive for altered mental status, confusion, encephalopathy, knee pain dementia and all other systems negative  PMH:  Past Medical History:  Diagnosis Date  . Bell's palsy   . Bell's palsy   . Bleeding ulcer    remote past  . 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 Dementia    disoriented to time.does not know medical hx.information obtained from previous charts  . Diabetes mellitus without complication ([place])   . GERD (gastroesophageal reflux disease)   . Hard of hearing   . Hyperlipemia   . Hypertension   . Iron deficiency anemia   . Obesity   . SOB (shortness of breath) on exertion     Social History:  Social History   Socioeconomic History  . Marital status: Single    Spouse name: Not on file  . Number of children: Not on file  . Years of education: Not on file  . Highest education level: Not on file  Occupational History  . Not on file  Tobacco Use  . Smoking status: Former Smoker    Packs/day: 1.00    Years: 30.00    Pack years: 30.00    Types: Cigarettes  . Smokeless tobacco: Never Used  . Tobacco comment: quit a "long time ago"   Vaping Use  .  Vaping Use: Never used  Substance and Sexual Activity  . Alcohol use: No  . Drug use: No  . Sexual activity: Never  Other Topics Concern  . Not on file  Social History Narrative   Full care resident @ [facility]   Unable to provide own history.    Social Determinants of Health   Financial Resource Strain: Not on file  Food Insecurity: Not on file  Transportation Needs: Not on file  Physical Activity: Not on file  Stress: Not on file  Social Connections: Not on file  Intimate Partner Violence: Not on  file    Medications:   Current Outpatient Medications on File Prior to Visit  Medication Sig Dispense Refill  . acetaminophen (TYLENOL) 650 MG CR tablet Take 1 tablet (650 mg total) by mouth every 8 (eight) hours as needed for pain or fever.    Marland Kitchen allopurinol (ZYLOPRIM) 100 MG tablet Take 100 mg by mouth daily.    Marland Kitchen atenolol (TENORMIN) 25 MG tablet Take 25 mg by mouth daily.     Marland Kitchen atorvastatin (LIPITOR) 10 MG tablet Take 5 mg by mouth at bedtime.     . carboxymethylcellulose (REFRESH PLUS) 0.5 % SOLN Place 1 drop into both eyes every 6 (six) hours as needed (Dry eye).     . cholecalciferol (VITAMIN D3) 25 MCG (1000 UNIT) tablet Take 1,000 Units by mouth daily.    . Colloidal Oatmeal (EUCERIN ECZEMA RELIEF) 1 % CREA Apply 1 application topically 2 (two) times daily. Apply to face and neck    . cycloSPORINE (RESTASIS) 0.05 % ophthalmic emulsion Place 1 drop into both eyes 2 (two) times daily.    . feeding supplement (ENSURE ENLIVE / ENSURE PLUS) LIQD Take 237 mLs by mouth 2 (two) times daily between meals.    . ferrous sulfate 325 (65 FE) MG EC tablet Take 325 mg by mouth daily at 12 noon.    . furosemide (LASIX) 40 MG tablet Take 40 mg by mouth daily.    Marland Kitchen ketoconazole (NIZORAL) 2 % shampoo Apply 1 application topically every 12 (twelve) hours as needed for irritation. Apply to Scalp    . metFORMIN (GLUCOPHAGE) 500 MG tablet Take 500 mg by mouth at bedtime.    . mometasone (ELOCON) 0.1 % cream Apply 1 application topically daily as needed (Dry skin).     . Multiple Vitamin (MULTIVITAMIN) tablet Take 1 tablet by mouth daily.    Marland Kitchen omega-3 acid ethyl esters (LOVAZA) 1 g capsule Take 2 capsules by mouth 2 (two) times daily.    . Omega-3 Fatty Acids (FISH OIL) 1000 MG CAPS Take 2,000 mg by mouth 2 (two) times daily.     . potassium chloride (KLOR-CON) 10 MEQ tablet Take 20 mEq by mouth 2 (two) times daily.    . vitamin B-12 (CYANOCOBALAMIN) 1000 MCG tablet Take 1,000 mcg by mouth daily.    .  cephALEXin (KEFLEX) 500 MG capsule Take 1 capsule (500 mg total) by mouth 4 (four) times daily. 20 capsule 0  . oxyCODONE-acetaminophen (PERCOCET) 5-325 MG tablet Take 1 tablet by mouth every 6 (six) hours as needed for severe pain. 12 tablet 0  . potassium chloride (KLOR-CON) 20 MEQ packet Take 20 mEq by mouth 2 (two) times daily.     . potassium chloride SA (KLOR-CON) 20 MEQ tablet     . Skin Protectants, Misc. (EUCERIN) cream Apply 1 application topically daily. Apply to feet     No current facility-administered medications  on file prior to visit.    Allergies:   Allergies  Allergen Reactions  . Hydrocodone   . Hydrocodone-Acetaminophen     unknown  . Aplisol [Tuberculin Ppd] Rash    Physical Exam General: Obese elderly Caucasian male, seated, in no evident distress Head: head normocephalic and atraumatic.   Neck: supple with no carotid or supraclavicular bruits Cardiovascular: regular rate and rhythm, no murmurs Musculoskeletal: no deformity Skin:  no rash/petichiae 2+ pedal edema bilaterally.  Papular lesions noted on scalp bilaterally. Vascular:  Normal pulses all extremities  Neurologic Exam Mental Status: Awake and fully alert. Disoriented to place and time. Recent and remote memory poor. Attention span, concentration and fund of knowledge diminished. Mood and affect appropriate.  Mini-Mental status exam not done Cranial Nerves: Fundoscopic exam reveals sharp disc margins. Pupils equal, briskly reactive to light. Extraocular movements full without nystagmus. Visual fields full to confrontation. Hearing diminished bilaterally. Facial sensation intact. Face, tongue, palate moves normally and symmetrically.  Motor: Normal bulk and tone.  3/5 strength bilaterally but the right leg strength testing limited due to pain on movements. Sensory.: intact to touch , pinprick , position and vibratory sensation.  Coordination: Rapid alternating movements normal in all extremities.  Finger-to-nose and heel-to-shin performed slowly but accurately bilaterally. Gait and Station: Deferred as patient is in a wheelchair and is nonambulatory at baseline. Reflexes: 1+ and symmetric. Toes downgoing.     Modified Rankin  4   ASSESSMENT: 75 year old Caucasian male with admission for encephalopathy in December 2021 in the setting of sepsis from Klebsiella pneumonia with limited MRI scan of the brain showing tiny left pontine lacunar infarct which is likely incidental.  Patient has advanced baseline dementia with very poor functional and quality of life     PLAN: I had a long discussion with the patient regarding his recent hospitalization and MRI scan showing possibly a small pontine stroke likely from small vessel disease but being unrelated to his clinical presentation at that time which was related to encephalopathy from sepsis related to Klebsiella pneumonia.  Given his advanced age of dementia and very limited lifestyle I agree with not doing any further stroke work-up and continuing aspirin 81 mg daily for stroke prevention and maintaining aggressive risk factor modification with strict control of hypertension and blood pressure goal below 130/90, lipids with LDL cholesterol goal below 70 mg percent and diabetes with hemoglobin A1c goal below 6.5%.  No need for further  scheduled follow-up appointment but he may be referred back in the future as needed only.  Greater than 50% time during this 45-minute consultation was it was spent on counseling and coordination of care discussion with the patient and and answering questions Antony Contras, MD  [facility] [address] [place], [place] [address]  Phone [phone] Fax [phone] Note: This document was prepared with digital dictation and possible smart phrase technology. Any transcriptional errors that result from this process are unintentional.

## 2020-11-24 ENCOUNTER — Ambulatory Visit: Payer: Medicare (Managed Care) | Admitting: Gastroenterology

## 2020-12-17 IMAGING — CT CT ABD-PELV W/O CM
2 of 4 series · 16 of 46 positions shown, 18 images · non-contrast
Comparison: CT abdomen/pelvis dated 05/09/2019

CLINICAL DATA: Stage III cecal adenocarcinoma, status post
resection

EXAM:
CT ABDOMEN AND PELVIS WITHOUT CONTRAST
TECHNIQUE: Multidetector CT imaging of the abdomen and pelvis was performed
following the standard protocol without IV contrast.

[Series 3: axial st · axial · 0.98mm/px · z∈[+1097,+1547]mm · 13 of 104 slices shown, 15 images]
[im 7/104  soft-tissue]
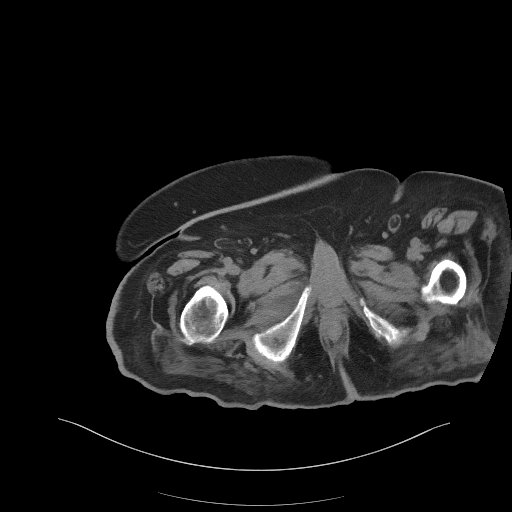
[im 7/104  bone]
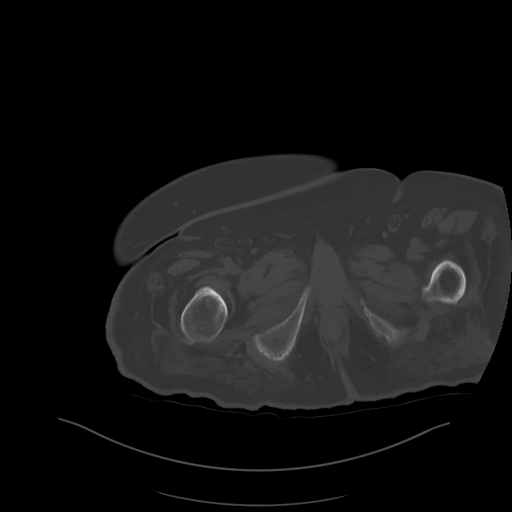
[im 14/104  soft-tissue]
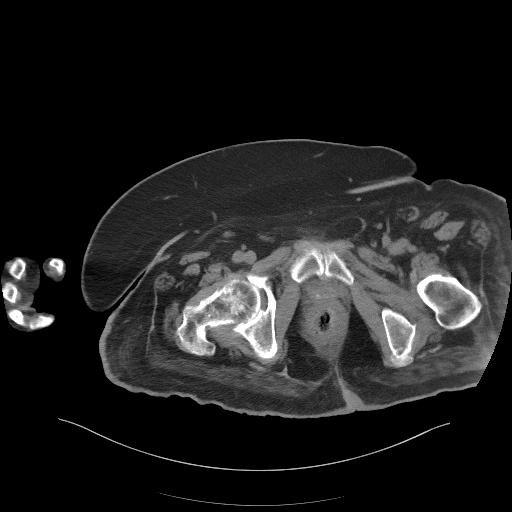
[im 21/104  soft-tissue]
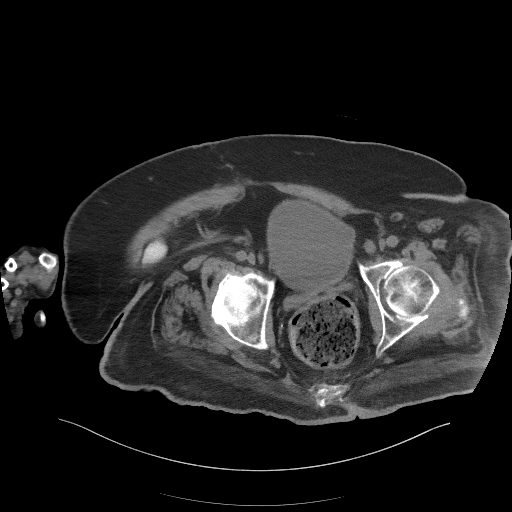
[im 28/104  soft-tissue]
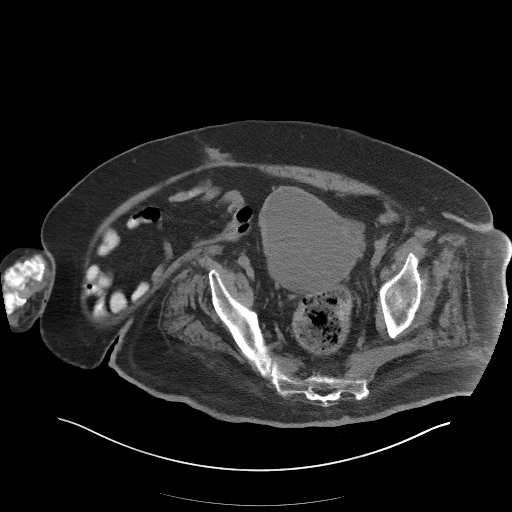
[im 35/104  soft-tissue]
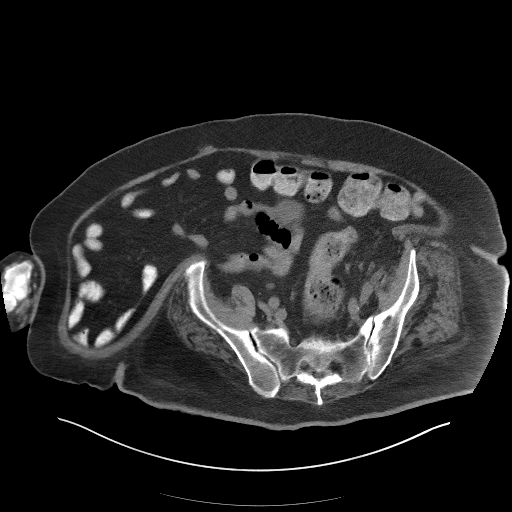
[im 42/104  soft-tissue]
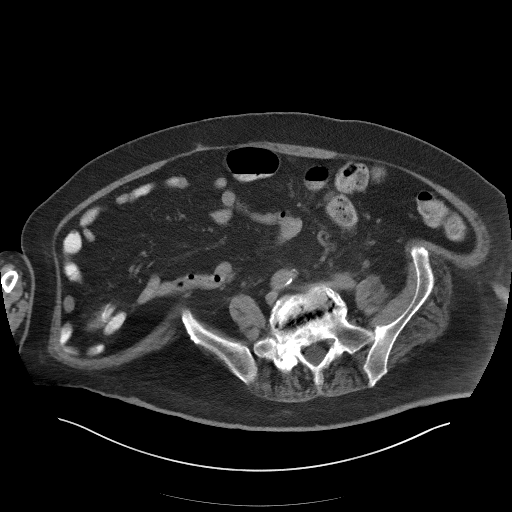
[im 55/104  soft-tissue]
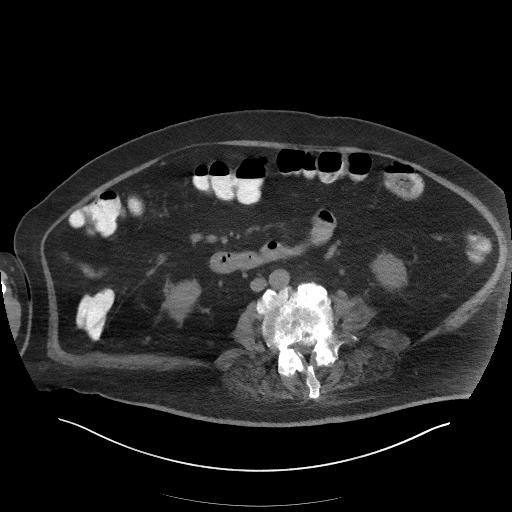
[im 62/104  soft-tissue]
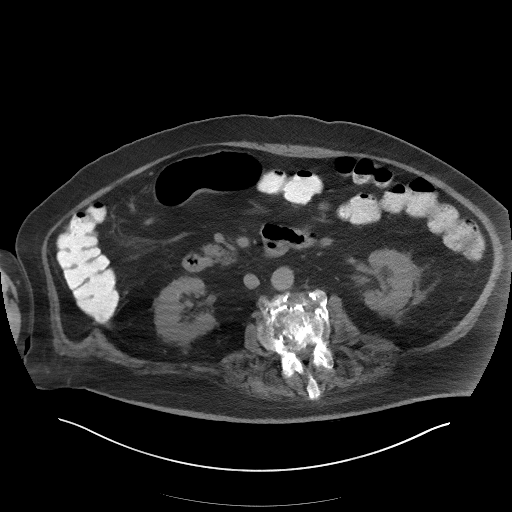
[im 69/104  soft-tissue]
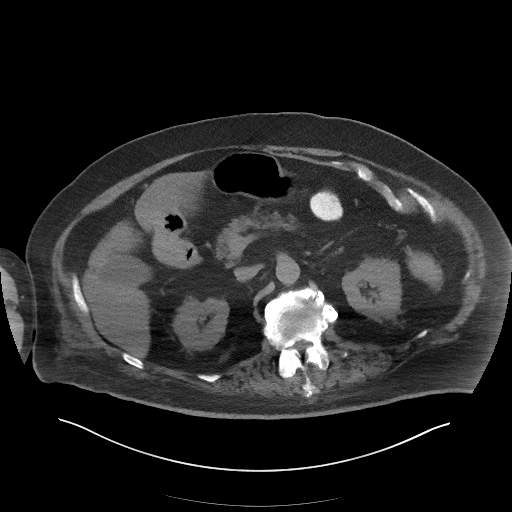
[im 69/104  bone]
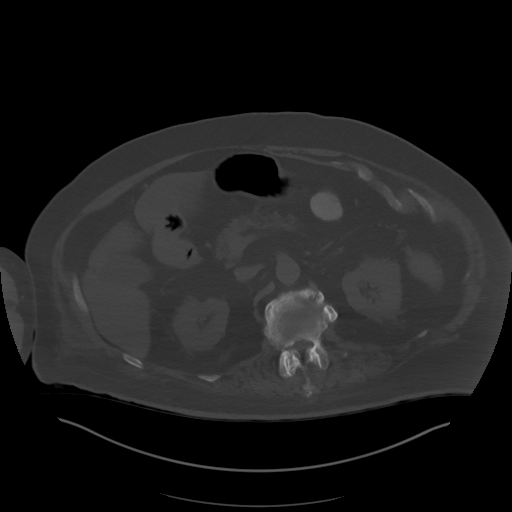
[im 76/104  soft-tissue]
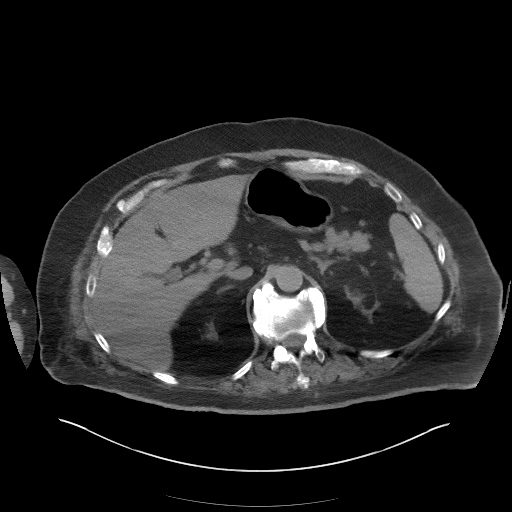
[im 83/104  soft-tissue]
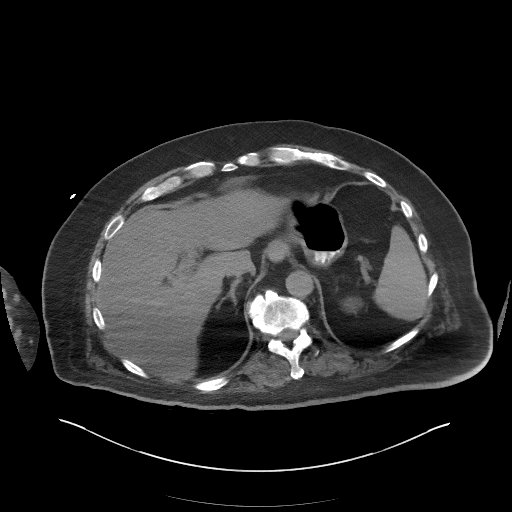
[im 90/104  soft-tissue]
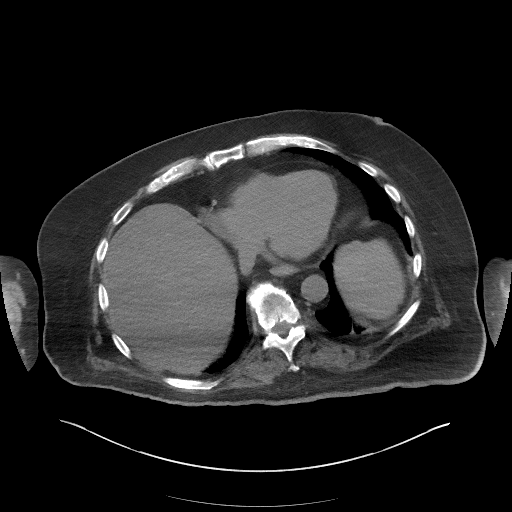
[im 97/104  soft-tissue]
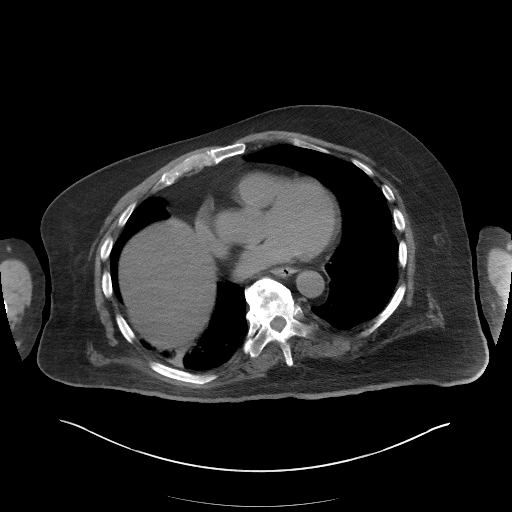

[Series 6: coronal st · coronal · 1.08mm/px · 3 of 143 slices shown]
[im 48/143  soft-tissue]
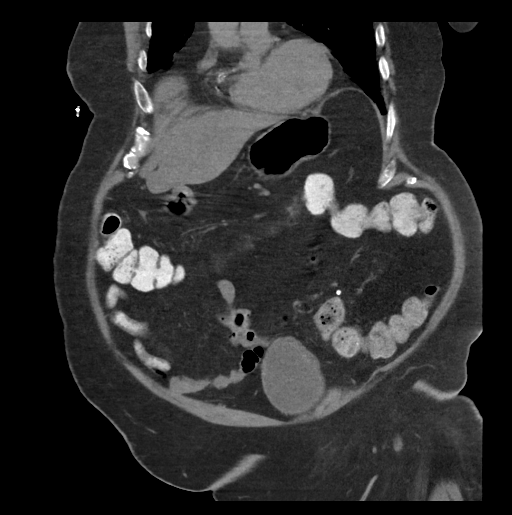
[im 64/143  soft-tissue]
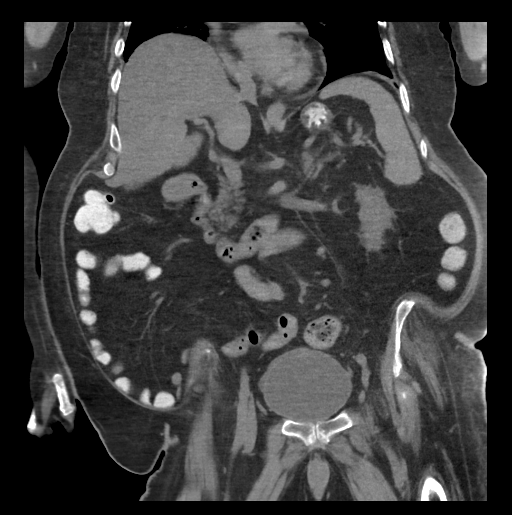
[im 79/143  soft-tissue]
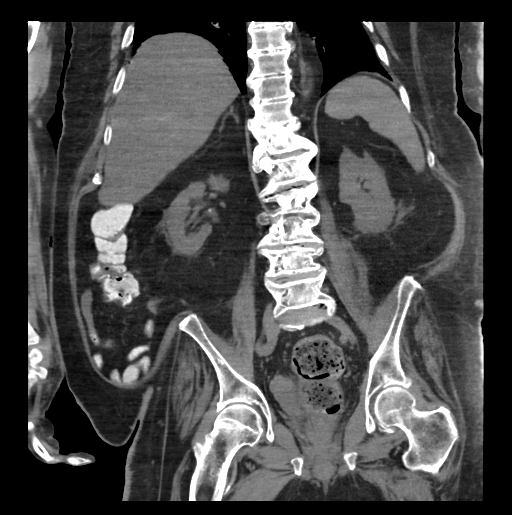

[16 of 46 positions shown; findings below may reference images not displayed]

FINDINGS: Lower chest: Mild bilateral lower lobe atelectasis.

Hepatobiliary: Liver is notable for mild hepatic steatosis focal
fatty sparing along gallbladder fossa.

Suspected mild layering gallbladder sludge (series 3/image 37),
without associated inflammatory changes. No intrahepatic or
extrahepatic ductal dilatation.

Pancreas: Within normal limits.

Spleen: Within normal limits.

Adrenals/Urinary Tract: Adrenal glands are within normal limits.

Two nonobstructing left upper pole renal calculi measuring up to 2
mm. Three nonobstructing right renal calculi measuring up to 4 mm in
the right upper pole (coronal image 86). Additional 3 mm calculus in
the right proximal collecting system (series 3/image 42).

3 mm distal right ureteral calculus at the UVJ (coronal image 35).

Bladder is within normal limits.

Stomach/Bowel: Stomach is within normal limits.

No evidence of bowel obstruction.

Status post right hemicolectomy with appendectomy.

No residual colonic wall thickening or mass is evident on CT.

Vascular/Lymphatic: No evidence of abdominal aortic aneurysm.

Atherosclerotic calcifications of the abdominal aorta and branch
vessels.

No suspicious abdominopelvic lymphadenopathy.

Reproductive: Prostate is unremarkable.

Other: No abdominopelvic ascites.

Mild fat in the right inguinal canal.

Musculoskeletal: Degenerative changes of the visualized
thoracolumbar spine.
IMPRESSION: Status post right hemicolectomy. No evidence of recurrent or
metastatic disease.

3 mm distal right ureteral calculus at the UVJ. Additional 3 mm
calculus in the right proximal collecting system. Additional
nonobstructing bilateral renal calculi measuring up to 4 mm on the
left. No hydronephrosis.

Mild hepatic steatosis with focal fatty sparing.

## 2020-12-30 ENCOUNTER — Ambulatory Visit (INDEPENDENT_AMBULATORY_CARE_PROVIDER_SITE_OTHER): Payer: Medicare (Managed Care) | Admitting: Gastroenterology

## 2020-12-30 ENCOUNTER — Ambulatory Visit: Payer: Medicaid Other | Admitting: Neurology

## 2020-12-30 ENCOUNTER — Other Ambulatory Visit: Payer: Self-pay

## 2020-12-30 ENCOUNTER — Encounter: Payer: Self-pay | Admitting: Gastroenterology

## 2020-12-30 VITALS — BP 124/74 | HR 86 | Temp 96.8°F | Ht 72.0 in

## 2020-12-30 DIAGNOSIS — R945 Abnormal results of liver function studies: Secondary | ICD-10-CM | POA: Diagnosis not present

## 2020-12-30 DIAGNOSIS — R7989 Other specified abnormal findings of blood chemistry: Secondary | ICD-10-CM

## 2020-12-30 DIAGNOSIS — Z85038 Personal history of other malignant neoplasm of large intestine: Secondary | ICD-10-CM | POA: Diagnosis not present

## 2020-12-30 NOTE — Patient Instructions (Signed)
1. I will discuss further with Dr. Gala Romney but at this time we will likely back off on attempts to try colonoscopy given previous failed bowel prep and multiple comorbidities.  2. Update labs today: CBC, LFTs

## 2020-12-30 NOTE — Progress Notes (Signed)
Primary Care Physician: Caprice Renshaw, MD  Primary Gastroenterologist:  Garfield Cornea, MD   Chief Complaint  Patient presents with  . discuss procedure    States he's doing ok    HPI: [name] is a 75 y.o. male, resident of long-term care facility, with history of dementia, Bell's palsy, stage III colon cancer, GI bleed, hypertension, hyperlipidemia, recent admission for septic shock due to Klebsiella pneumonia bacteria and pyelonephritis with obstructive uropathy in December 2021, tiny left pontine lacunar infarct presenting for follow-up.  Patient was last seen in August 2021.  We scheduled him for 1 year surveillance colonoscopy for history of stage III cecal adenocarcinoma.  Previously underwent right hemicolectomy 04/2019.  No chemotherapy due to poor performance status.  Unfortunately patient was not adequately cleaned out for procedure, therefore colonoscopy was canceled.  Patient's legal guardian is Lanelle Bal at [phone].  Patient is poor historian. Does answer questions but reliability unclear. He denies abdominal pain. States his appetite is good. No reported melena or brbpr per CNA who presents with patient. BMs regular. He is wheelchair bound.   Current Outpatient Medications  Medication Sig Dispense Refill  . acetaminophen (TYLENOL) 650 MG CR tablet Take 1 tablet (650 mg total) by mouth every 8 (eight) hours as needed for pain or fever.    Marland Kitchen allopurinol (ZYLOPRIM) 100 MG tablet Take 100 mg by mouth daily.    Marland Kitchen atenolol (TENORMIN) 25 MG tablet Take 25 mg by mouth daily.     Marland Kitchen atorvastatin (LIPITOR) 10 MG tablet Take 5 mg by mouth at bedtime.     . carboxymethylcellulose (REFRESH PLUS) 0.5 % SOLN Place 1 drop into both eyes every 6 (six) hours as needed (Dry eye).     . cholecalciferol (VITAMIN D3) 25 MCG (1000 UNIT) tablet Take 1,000 Units by mouth daily.    . Colloidal Oatmeal (EUCERIN ECZEMA RELIEF) 1 % CREA Apply 1 application topically 2 (two) times daily.  Apply to face and neck    . cycloSPORINE (RESTASIS) 0.05 % ophthalmic emulsion Place 1 drop into both eyes 2 (two) times daily.    . feeding supplement (ENSURE ENLIVE / ENSURE PLUS) LIQD Take 237 mLs by mouth 2 (two) times daily between meals.    . ferrous sulfate 325 (65 FE) MG EC tablet Take 325 mg by mouth daily at 12 noon.    . furosemide (LASIX) 40 MG tablet Take 40 mg by mouth daily.    Marland Kitchen ketoconazole (NIZORAL) 2 % shampoo Apply 1 application topically every 12 (twelve) hours as needed for irritation. Apply to Scalp    . metFORMIN (GLUCOPHAGE) 500 MG tablet Take 500 mg by mouth at bedtime.    . mometasone (ELOCON) 0.1 % cream Apply 1 application topically daily as needed (Dry skin).     . Multiple Vitamin (MULTIVITAMIN) tablet Take 1 tablet by mouth daily.    . Omega-3 Fatty Acids (FISH OIL) 1000 MG CAPS Take 2,000 mg by mouth 2 (two) times daily.     . potassium chloride (KLOR-CON) 20 MEQ packet Take 20 mEq by mouth 2 (two) times daily.     . Skin Protectants, Misc. (EUCERIN) cream Apply 1 application topically daily. Apply to feet    . vitamin B-12 (CYANOCOBALAMIN) 1000 MCG tablet Take 1,000 mcg by mouth daily.     No current facility-administered medications for this visit.    Allergies as of 12/30/2020 - Review Complete 12/30/2020  Allergen Reaction Noted  . Hydrocodone  08/26/2017  . Hydrocodone-acetaminophen  06/17/2011  . Aplisol [tuberculin ppd] Rash 06/14/2019    ROS: ?reliability  General: Negative for anorexia, weight loss, fever, chills, fatigue, +weakness. Does not ambulate. ENT: Negative for hoarseness, difficulty swallowing , nasal congestion. CV: Negative for chest pain, angina, palpitations, dyspnea on exertion, peripheral edema.  Respiratory: Negative for dyspnea at rest, dyspnea on exertion, cough, sputum, wheezing.  GI: See history of present illness. GU:  Negative for dysuria, hematuria, urinary incontinence, urinary frequency, nocturnal urination.  Endo:  Negative for unusual weight change.    Physical Examination:   BP 124/74   Pulse 86   Temp (!) 96.8 F (36 C) (Temporal)   Ht 6' (1.829 m)   BMI 30.35 kg/m   General: chronically ill appearing male in no acute distress.  Eyes: No icterus. Mouth: masked.  Abdomen: Bowel sounds are normal, nontender, nondistended, no hepatosplenomegaly or masses, no abdominal bruits or hernia , no rebound or guarding.  Exam limited by body habitus Extremities: 2+ bilateral lower extremity edema. No clubbing or deformities. Neuro: Alert and oriented x 4   Skin: Warm and dry, no jaundice.   Psych: Alert and cooperative, normal mood and affect.  Labs:  Lab Results  Component Value Date   CREATININE 0.99 09/30/2020   BUN 24 (H) 09/30/2020   NA 140 09/30/2020   K 4.7 09/30/2020   CL 102 09/30/2020   CO2 26 09/30/2020   Lab Results  Component Value Date   ALT 103 (H) 09/11/2020   AST 212 (H) 09/11/2020   ALKPHOS 141 (H) 09/11/2020   BILITOT 0.5 09/11/2020   Lab Results  Component Value Date   WBC 10.3 09/30/2020   HGB 14.0 09/30/2020   HCT 45.4 09/30/2020   MCV 94.8 09/30/2020   PLT 186 09/30/2020   Lab Results  Component Value Date   IRON 62 12/11/2019   TIBC 320 12/11/2019   FERRITIN 41 12/11/2019       Imaging Studies:   CLINICAL DATA:  Mesenteric ischemia  EXAM: CTA ABDOMEN AND PELVIS WITHOUT AND WITH CONTRAST  TECHNIQUE: Multidetector CT imaging of the abdomen and pelvis was performed using the standard protocol during bolus administration of intravenous contrast. Multiplanar reconstructed images and MIPs were obtained and reviewed to evaluate the vascular anatomy.  CONTRAST:  28mL OMNIPAQUE IOHEXOL 350 MG/ML SOLN  COMPARISON:  12/25/2018  FINDINGS: VASCULAR  Scattered aortic atherosclerosis. The aorta is normal in contour and caliber with a standard branching pattern and solitary bilateral renal arteries. The branch vessels, including the  superior mesenteric artery, are widely patent from their origins.  Review of the MIP images confirms the above findings.  NON-VASCULAR  Lower chest: Trace bilateral pleural effusions and associated atelectasis or consolidation.  Hepatobiliary: No solid liver abnormality is seen. No gallstones, gallbladder wall thickening, or biliary dilatation.  Pancreas: Unremarkable. No pancreatic ductal dilatation or surrounding inflammatory changes.  Spleen: Normal in size without significant abnormality.  Adrenals/Urinary Tract: Adrenal glands are unremarkable. Multiple small bilateral renal calculi. There is a 7 mm calculus in the proximal third of right ureter with mild associated hydronephrosis (series 10, image 132). Fat stranding about the right kidney and the adjacent transverse portion of the duodenum (series 10, image 120). Wall thickening and multiple small diverticula of the urinary bladder.  Stomach/Bowel: Stomach is within normal limits. Postoperative findings of the right hemicolon, likely terminal ileocolectomy. No evidence of bowel wall thickening, distention, or inflammatory changes.  Lymphatic: No enlarged abdominal or pelvic lymph  nodes.  Reproductive: No mass or other significant abnormality.  Other: No abdominal wall hernia or abnormality. No abdominopelvic ascites.  Musculoskeletal: No acute or significant osseous findings.  IMPRESSION: 1. The aorta is normal in contour and caliber with a standard branching pattern and solitary bilateral renal arteries. Scattered aortic atherosclerosis. The branch vessels, including the superior mesenteric artery, are widely patent from their origins. No evidence of mesenteric ischemia. 2. There is a 7 mm calculus in the proximal third of right ureter with mild associated hydronephrosis. Reactive fat stranding about the right kidney and the adjacent transverse portion of the duodenum. 3. Multiple additional  small bilateral renal calculi. 4. Wall thickening and multiple small diverticula of the urinary bladder, suggestive of chronic outlet obstruction. 5. Trace bilateral pleural effusions and associated atelectasis or consolidation.  Aortic Atherosclerosis (ICD10-I70.0).   Electronically Signed   By: Eddie Candle M.D.   On: 09/07/2020 13:11  Assessment/Plan:  75 y/o male with history of stage III cecal adenocarcinoma s/p right hemicolectomy 04/2019, presenting to discuss possibility of colonoscopy. Patient was seen last year, attempted to prep for colonoscopy but was unsuccessful at the nursing home. At the time, hospital admissions for elective reasons was on hold due to bed shortages/pandemic. Patient has multiple comorbidities, with most recent hospitalization in 08/2020 due to sepsis, obstructive uropathy, stroke. Palliative consult initiated while inpatient but it is not clear if goals were finalized, as at time of discharge contact with patient's legal guardian had not occurred.   Will discuss further with Dr. Gala Romney regarding possibly holding off on surveillance colonoscopy given patient's overall poor health. Currently without signs of anemia or overt GI bleeding. Patient voiced desire to forego colonoscopy but this will need to be discussed with the legal guardian. Further recommendations to follow.   In the meantime will update labs, for anemia. Also update LFTs, given recent bump, although likely due to his acute illness at the time.

## 2021-02-11 NOTE — Progress Notes (Signed)
[place], [place] [address]   CLINIC:  Medical Oncology/Hematology  PCP:  Caprice Renshaw, MD [address] / [place] [address] [phone]   REASON FOR VISIT:  Follow-up for cecal cancer  PRIOR THERAPY: feraheme  NGS Results: not done  CURRENT THERAPY: observation  BRIEF ONCOLOGIC HISTORY:  Oncology History   No history exists.    CANCER STAGING: Cancer Staging No matching staging information was found for the patient.  INTERVAL HISTORY:  [name], a 75 y.o. male, returns for routine follow-up of his cecal cancer. [name] was last seen on 04/01/2020.   Today her reports feeling well. He reports normal bowel movements and denies any nausea, diarrhea, or constipation. He is currently living at [facility]. He denies any blood in stools or urine. He is not mobile, and his not currently in PT.   REVIEW OF SYSTEMS:  Review of Systems  Constitutional: Negative for appetite change.  Cardiovascular: Positive for leg swelling (improved).  Gastrointestinal: Negative for blood in stool.  Genitourinary: Negative for hematuria.   Musculoskeletal: Positive for myalgias (leg pain 5/10).  All other systems reviewed and are negative.   PAST MEDICAL/SURGICAL HISTORY:  Past Medical History:  Diagnosis Date  . Bell's palsy   . Bell's palsy   . Bleeding ulcer    remote past  . Cecal cancer ([place]) 04/2019   Stage III  . Dementia    disoriented to time.does not know medical hx.information obtained from previous charts  . Diabetes mellitus without complication ([place])   . GERD (gastroesophageal reflux disease)   . Hard of hearing   . Hyperlipemia   . Hypertension   . Iron deficiency anemia   . Obesity   . SOB (shortness of breath) on exertion    Past Surgical History:  Procedure Laterality Date  . BIOPSY  05/09/2019   Procedure: BIOPSY;  Surgeon: Daneil Dolin, MD;  [facility];  Service: Endoscopy;;  .  COLONOSCOPY  10/06/06   internal hemorrhoids and anal papilla/poor prep  . 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 CYSTOSCOPY W/ URETERAL STENT PLACEMENT Right 09/08/2020   Procedure: CYSTOSCOPY WITH RETROGRADE PYELOGRAM/URETERAL STENT PLACEMENT;  Surgeon: Irine Seal, MD;  Location: [facility];  Service: Urology;  Laterality: Right;  . CYSTOSCOPY/URETEROSCOPY/HOLMIUM LASER/STENT PLACEMENT Right 09/30/2020   Procedure: CYSTOSCOPY RIGHT URETEROSCOPY/HOLMIUM LASER/STENT PLACEMENT;  Surgeon: Irine Seal, MD;  Location: [facility];  Service: Urology;  Laterality: Right;  . ESOPHAGOGASTRODUODENOSCOPY N/A 05/01/2018   multiple 3-6 mm gastric nodules and gastritis, surrounding telangectasias. Biopsy with atrophic gastritis.   Marland Kitchen FOOT SURGERY    . KNEE ARTHROSCOPY  06/17/2011   Procedure: ARTHROSCOPY KNEE;  Surgeon: Sanjuana Kava;  Location: [facility];  Service: Orthopedics;  Laterality: Right;  . LAPAROSCOPIC PARTIAL COLECTOMY N/A 05/11/2019   Procedure: ATTEMPTED LAPAROSCOPIC PARTIAL COLECTOMY;  Surgeon: [name], MD;  Location: [facility];  Service: General;  Laterality: N/A;  . PARTIAL COLECTOMY  05/11/2019   Procedure: PARTIAL COLECTOMY (Laparotomy incision at 7622;  Surgeon: [name], MD;  Location: [facility];  Service: General;;  . POLYPECTOMY  05/09/2019   Procedure: POLYPECTOMY;  Surgeon: Daneil Dolin, MD;  [facility];  Service: Endoscopy;;    SOCIAL HISTORY:  Social History   Socioeconomic History  . Marital status: Single    Spouse name: Not on file  . Number of children: Not on file  .  Years of education: Not on file  . Highest education level: Not on file  Occupational History  . Not on file  Tobacco Use  . Smoking status: Former Smoker    Packs/day: 1.00    Years: 30.00    Pack years: 30.00    Types: Cigarettes  . Smokeless tobacco: Never Used  . Tobacco comment: quit a "long time ago"   Vaping Use  . Vaping Use: Never used  Substance and  Sexual Activity  . Alcohol use: No  . Drug use: No  . Sexual activity: Never  Other Topics Concern  . Not on file  Social History Narrative   Full care resident @ [facility]   Unable to provide own history.    Social Determinants of Health   Financial Resource Strain: Not on file  Food Insecurity: Not on file  Transportation Needs: Not on file  Physical Activity: Not on file  Stress: Not on file  Social Connections: Not on file  Intimate Partner Violence: Not on file    FAMILY HISTORY:  Family History  Problem Relation Age of Onset  . COPD Mother   . Cancer Brother   . Anesthesia problems Neg Hx   . Hypotension Neg Hx   . Malignant hyperthermia Neg Hx   . Pseudochol deficiency Neg Hx   . Colon cancer Neg Hx     CURRENT MEDICATIONS:  Current Outpatient Medications  Medication Sig Dispense Refill  . acetaminophen (TYLENOL) 650 MG CR tablet Take 1 tablet (650 mg total) by mouth every 8 (eight) hours as needed for pain or fever.    Marland Kitchen allopurinol (ZYLOPRIM) 100 MG tablet Take 100 mg by mouth daily.    Marland Kitchen atenolol (TENORMIN) 25 MG tablet Take 25 mg by mouth daily.     Marland Kitchen atorvastatin (LIPITOR) 10 MG tablet Take 5 mg by mouth at bedtime.     . carboxymethylcellulose (REFRESH PLUS) 0.5 % SOLN Place 1 drop into both eyes every 6 (six) hours as needed (Dry eye).     . cholecalciferol (VITAMIN D3) 25 MCG (1000 UNIT) tablet Take 1,000 Units by mouth daily.    . Colloidal Oatmeal (EUCERIN ECZEMA RELIEF) 1 % CREA Apply 1 application topically 2 (two) times daily. Apply to face and neck    . cycloSPORINE (RESTASIS) 0.05 % ophthalmic emulsion Place 1 drop into both eyes 2 (two) times daily.    . feeding supplement (ENSURE ENLIVE / ENSURE PLUS) LIQD Take 237 mLs by mouth 2 (two) times daily between meals.    . ferrous sulfate 325 (65 FE) MG EC tablet Take 325 mg by mouth daily at 12 noon.    . furosemide (LASIX) 40 MG tablet Take 40 mg by mouth daily.    Marland Kitchen ketoconazole  (NIZORAL) 2 % shampoo Apply 1 application topically every 12 (twelve) hours as needed for irritation. Apply to Scalp    . metFORMIN (GLUCOPHAGE) 500 MG tablet Take 500 mg by mouth at bedtime.    . mometasone (ELOCON) 0.1 % cream Apply 1 application topically daily as needed (Dry skin).     . Multiple Vitamin (MULTIVITAMIN) tablet Take 1 tablet by mouth daily.    . Omega-3 Fatty Acids (FISH OIL) 1000 MG CAPS Take 2,000 mg by mouth 2 (two) times daily.     . potassium chloride (KLOR-CON) 20 MEQ packet Take 20 mEq by mouth 2 (two) times daily.     . Skin Protectants, Misc. (EUCERIN) cream Apply 1 application topically  daily. Apply to feet    . vitamin B-12 (CYANOCOBALAMIN) 1000 MCG tablet Take 1,000 mcg by mouth daily.     No current facility-administered medications for this visit.    ALLERGIES:  Allergies  Allergen Reactions  . Hydrocodone   . Hydrocodone-Acetaminophen     unknown  . Aplisol [Tuberculin Ppd] Rash    PHYSICAL EXAM:  Performance status (ECOG): 1 - Symptomatic but completely ambulatory  There were no vitals filed for this visit. Wt Readings from Last 3 Encounters:  11/19/20 223 lb 12.8 oz (101.5 kg)  10/09/20 215 lb (97.5 kg)  09/30/20 215 lb (97.5 kg)   Physical Exam Vitals reviewed.  Constitutional:      Appearance: Normal appearance.     Comments: In wheelchair  Cardiovascular:     Rate and Rhythm: Normal rate and regular rhythm.     Pulses: Normal pulses.     Heart sounds: Normal heart sounds.  Pulmonary:     Effort: Pulmonary effort is normal.     Breath sounds: Normal breath sounds.  Chest:  Breasts:     Right: No supraclavicular adenopathy.     Left: No supraclavicular adenopathy.    Abdominal:     Palpations: There is no hepatomegaly, splenomegaly or mass.     Tenderness: There is no abdominal tenderness.  Musculoskeletal:     Right lower leg: No edema.     Left lower leg: No edema.  Lymphadenopathy:     Upper Body:     Right upper body: No  supraclavicular adenopathy.     Left upper body: No supraclavicular adenopathy.  Neurological:     General: No focal deficit present.     Mental Status: He is alert and oriented to person, place, and time.  Psychiatric:        Mood and Affect: Mood normal.        Behavior: Behavior normal.      LABORATORY DATA:  I have reviewed the labs as listed.  CBC Latest Ref Rng & Units 09/30/2020 09/11/2020 09/10/2020  WBC 4.0 - 10.5 K/uL 10.3 7.8 8.9  Hemoglobin 13.0 - 17.0 g/dL 14.0 10.2(L) 10.3(L)  Hematocrit 39.0 - 52.0 % 45.4 31.3(L) 31.5(L)  Platelets 150 - 400 K/uL 186 124(L) 121(L)   CMP Latest Ref Rng & Units 09/30/2020 09/11/2020 09/10/2020  Glucose 70 - 99 mg/dL 109(H) 132(H) 144(H)  BUN 8 - 23 mg/dL 24(H) 18 26(H)  Creatinine 0.61 - 1.24 mg/dL 0.99 1.06 1.26(H)  Sodium 135 - 145 mmol/L 140 141 143  Potassium 3.5 - 5.1 mmol/L 4.7 3.6 3.5  Chloride 98 - 111 mmol/L 102 104 106  CO2 22 - 32 mmol/L _0 Calcium 8.9 - 10.3 mg/dL 9.0 8.0(L) 8.0(L)  Total Protein 6.5 - 8.1 g/dL - 5.3(L) 5.4(L)  Total Bilirubin 0.3 - 1.2 mg/dL - 0.5 0.9  Alkaline Phos 38 - 126 U/L - 141(H) 112  AST 15 - 41 U/L - 212(H) 161(H)  ALT 0 - 44 U/L - 103(H) 81(H)    DIAGNOSTIC IMAGING:  I have independently reviewed the scans and discussed with the patient. No results found.   ASSESSMENT:  1.  Stage III cecal adenocarcinoma: -Status post resection on 05/11/2019, MSI stable, 1 isolated tumor deposit, 0/18 lymph nodes. -Preoperative CEA was 6.1. -He was not felt to be a candidate for adjuvant chemotherapy based on his performance status. -Labs on 03/09/2020 showed his CEA at 7.3.  AST is slightly elevated. -CT [facility] on 12/25/2019  showed no evidence of recurrent or metastatic disease. -There is fatty infiltration of the liver which is likely causing his AST elevation.  Distal right ureteral calculus, 3 mm at UVJ.  Additional 3 mm calculus in the right proximal collecting system. -Labs done on 03/25/2020 showed  CEA level 5.8. -he will come back in 3 months with repeat labs and CT scan.  2.  Iron deficiency state: -She will continue oral iron therapy. -Labs done on 03/25/2020 showed hemoglobin 13.9   PLAN:  1.  Stage III cecal adenocarcinoma: - He was hospitalized in December since he was last seen by [place] a year ago. - He reportedly lost significant weight. - I have reviewed his CT angiogram from 09/07/2020 which did not show any evidence of metastatic disease to the liver or adenopathy. - He did not have any labs drawn prior to this visit.  I have recommended labs to be done today.  I have also recommended follow-up visit in 3 months with a CT scan of the abdomen and pelvis.  2. Iron deficiency state: - Last CBC on 09/30/2020 shows hemoglobin improved to 14.   Orders placed this encounter:  No orders of the defined types were placed in this encounter.    Derek Jack, MD [place] [phone]   I, Thana Ates, am acting as a scribe for Dr. Derek Jack.  I, Derek Jack MD, have reviewed the above documentation for accuracy and completeness, and I agree with the above.

## 2021-02-12 ENCOUNTER — Inpatient Hospital Stay (HOSPITAL_COMMUNITY): Payer: Medicare (Managed Care) | Attending: Hematology | Admitting: Hematology

## 2021-02-12 ENCOUNTER — Encounter (HOSPITAL_COMMUNITY): Payer: Self-pay | Admitting: Hematology

## 2021-02-12 ENCOUNTER — Other Ambulatory Visit: Payer: Self-pay

## 2021-02-12 VITALS — BP 127/69 | HR 81 | Temp 97.0°F | Resp 18

## 2021-02-12 DIAGNOSIS — Z885 Allergy status to narcotic agent status: Secondary | ICD-10-CM | POA: Diagnosis not present

## 2021-02-12 DIAGNOSIS — M791 Myalgia, unspecified site: Secondary | ICD-10-CM | POA: Insufficient documentation

## 2021-02-12 DIAGNOSIS — Z87891 Personal history of nicotine dependence: Secondary | ICD-10-CM | POA: Insufficient documentation

## 2021-02-12 DIAGNOSIS — M7989 Other specified soft tissue disorders: Secondary | ICD-10-CM | POA: Diagnosis not present

## 2021-02-12 DIAGNOSIS — Z8719 Personal history of other diseases of the digestive system: Secondary | ICD-10-CM | POA: Insufficient documentation

## 2021-02-12 DIAGNOSIS — C180 Malignant neoplasm of cecum: Secondary | ICD-10-CM | POA: Diagnosis present

## 2021-02-12 DIAGNOSIS — Z79899 Other long term (current) drug therapy: Secondary | ICD-10-CM | POA: Insufficient documentation

## 2021-02-12 DIAGNOSIS — N201 Calculus of ureter: Secondary | ICD-10-CM | POA: Diagnosis not present

## 2021-02-12 NOTE — Patient Instructions (Signed)
[place] at [facility] Discharge Instructions  You were seen today by Dr. Delton Coombes. He went over your recent results. You will be scheduled for a CT scan of your abdomen prior to your next visit. Dr. Delton Coombes will see you back in 3 months for labs and follow up.   Thank you for choosing [place] at [facility] to provide your oncology and hematology care.  To afford each patient quality time with our provider, please arrive at least 15 minutes before your scheduled appointment time.   If you have a lab appointment with the [facility] please come in thru the Main Entrance and check in at the main information desk  You need to re-schedule your appointment should you arrive 10 or more minutes late.  We strive to give you quality time with our providers, and arriving late affects you and other patients whose appointments are after yours.  Also, if you no show three or more times for appointments you may be dismissed from the clinic at the providers discretion.     Again, thank you for choosing [facility].  Our hope is that these requests will decrease the amount of time that you wait before being seen by our physicians.       _____________________________________________________________  Should you have questions after your visit to [facility], please contact our office at [phone] between the hours of 8:00 a.m. and 4:30 p.m.  Voicemails left after 4:00 p.m. will not be returned until the following business day.  For prescription refill requests, have your pharmacy contact our office and allow 72 hours.    Cancer Center Support Programs:   > Cancer Support Group  2nd Tuesday of the month 1pm-2pm, Journey Room

## 2021-03-01 ENCOUNTER — Emergency Department (HOSPITAL_COMMUNITY)
Admission: EM | Admit: 2021-03-01 | Discharge: 2021-03-02 | Disposition: A | Discharge status: Home / Self Care | Payer: Medicare (Managed Care) | Source: Home / Self Care | Attending: Emergency Medicine | Admitting: Emergency Medicine

## 2021-03-01 ENCOUNTER — Other Ambulatory Visit: Payer: Self-pay

## 2021-03-01 ENCOUNTER — Encounter (HOSPITAL_COMMUNITY): Payer: Self-pay | Admitting: Emergency Medicine

## 2021-03-01 ENCOUNTER — Emergency Department (HOSPITAL_COMMUNITY): Payer: Medicare (Managed Care)

## 2021-03-01 DIAGNOSIS — Z7984 Long term (current) use of oral hypoglycemic drugs: Secondary | ICD-10-CM | POA: Insufficient documentation

## 2021-03-01 DIAGNOSIS — R911 Solitary pulmonary nodule: Secondary | ICD-10-CM

## 2021-03-01 DIAGNOSIS — Z85038 Personal history of other malignant neoplasm of large intestine: Secondary | ICD-10-CM | POA: Insufficient documentation

## 2021-03-01 DIAGNOSIS — N3000 Acute cystitis without hematuria: Secondary | ICD-10-CM

## 2021-03-01 DIAGNOSIS — R112 Nausea with vomiting, unspecified: Secondary | ICD-10-CM

## 2021-03-01 DIAGNOSIS — Z87891 Personal history of nicotine dependence: Secondary | ICD-10-CM | POA: Insufficient documentation

## 2021-03-01 DIAGNOSIS — E860 Dehydration: Secondary | ICD-10-CM | POA: Insufficient documentation

## 2021-03-01 DIAGNOSIS — A419 Sepsis, unspecified organism: Secondary | ICD-10-CM | POA: Diagnosis not present

## 2021-03-01 DIAGNOSIS — I10 Essential (primary) hypertension: Secondary | ICD-10-CM | POA: Insufficient documentation

## 2021-03-01 DIAGNOSIS — F0390 Unspecified dementia without behavioral disturbance: Secondary | ICD-10-CM | POA: Insufficient documentation

## 2021-03-01 DIAGNOSIS — Z79899 Other long term (current) drug therapy: Secondary | ICD-10-CM | POA: Insufficient documentation

## 2021-03-01 DIAGNOSIS — E119 Type 2 diabetes mellitus without complications: Secondary | ICD-10-CM | POA: Insufficient documentation

## 2021-03-01 LAB — COMPREHENSIVE METABOLIC PANEL
ALT: 18 U/L (ref 0–44)
AST: 23 U/L (ref 15–41)
Albumin: 3.4 g/dL — ABNORMAL LOW (ref 3.5–5.0)
Alkaline Phosphatase: 84 U/L (ref 38–126)
Anion gap: 12 (ref 5–15)
BUN: 50 mg/dL — ABNORMAL HIGH (ref 8–23)
CO2: 26 mmol/L (ref 22–32)
Calcium: 9 mg/dL (ref 8.9–10.3)
Chloride: 100 mmol/L (ref 98–111)
Creatinine, Ser: 1.41 mg/dL — ABNORMAL HIGH (ref 0.61–1.24)
GFR, Estimated: 52 mL/min — ABNORMAL LOW (ref >60)
Glucose, Bld: 135 mg/dL — ABNORMAL HIGH (ref 70–99)
Potassium: 4.9 mmol/L (ref 3.5–5.1)
Sodium: 138 mmol/L (ref 135–145)
Total Bilirubin: 1.5 mg/dL — ABNORMAL HIGH (ref 0.3–1.2)
Total Protein: 7.2 g/dL (ref 6.5–8.1)

## 2021-03-01 LAB — CBC
HCT: 41.1 % (ref 39.0–52.0)
Hemoglobin: 13.4 g/dL (ref 13.0–17.0)
MCH: 30.2 pg (ref 26.0–34.0)
MCHC: 32.6 g/dL (ref 30.0–36.0)
MCV: 92.8 fL (ref 80.0–100.0)
Platelets: 190 10*3/uL (ref 150–400)
RBC: 4.43 MIL/uL (ref 4.22–5.81)
RDW: 13.9 % (ref 11.5–15.5)
WBC: 9.4 10*3/uL (ref 4.0–10.5)
nRBC: 0 % (ref 0.0–0.2)

## 2021-03-01 LAB — LIPASE, BLOOD: Lipase: 24 U/L (ref 11–51)

## 2021-03-01 MED ORDER — SODIUM CHLORIDE 0.9 % IV BOLUS (SEPSIS)
1000.0000 mL | Freq: Once | INTRAVENOUS | Status: AC
  Administered 2021-03-01: 1000 mL via INTRAVENOUS

## 2021-03-01 MED ORDER — ONDANSETRON HCL 4 MG/2ML IJ SOLN
4.0000 mg | Freq: Once | INTRAMUSCULAR | Status: AC
  Administered 2021-03-01: 4 mg via INTRAVENOUS
  Filled 2021-03-01: qty 2

## 2021-03-01 NOTE — ED Triage Notes (Signed)
Pt started vomiting today per nursing facility. Pt normally not on o2 but nursing facility states his o2 was 85% on room air.

## 2021-03-02 ENCOUNTER — Encounter (HOSPITAL_COMMUNITY): Payer: Self-pay

## 2021-03-02 LAB — URINALYSIS, ROUTINE W REFLEX MICROSCOPIC
Bilirubin Urine: NEGATIVE
Glucose, UA: NEGATIVE mg/dL
Ketones, ur: 5 mg/dL — AB
Nitrite: POSITIVE — AB
Protein, ur: NEGATIVE mg/dL
Specific Gravity, Urine: 1.016 (ref 1.005–1.030)
WBC, UA: >50 WBC/hpf — ABNORMAL HIGH (ref 0–5)
pH: 5 (ref 5.0–8.0)

## 2021-03-02 MED ORDER — CEPHALEXIN 500 MG PO CAPS: 500.0000 mg | ORAL_CAPSULE | Freq: Two times a day (BID) | ORAL | 0 refills | Status: DC

## 2021-03-02 MED ORDER — SODIUM CHLORIDE 0.9 % IV BOLUS (SEPSIS)
1000.0000 mL | Freq: Once | INTRAVENOUS | Status: AC
  Administered 2021-03-02: 1000 mL via INTRAVENOUS

## 2021-03-02 MED ORDER — SODIUM CHLORIDE 0.9 % IV SOLN
1.0000 g | Freq: Once | INTRAVENOUS | Status: AC
  Administered 2021-03-02: 1 g via INTRAVENOUS
  Filled 2021-03-02: qty 10

## 2021-03-02 NOTE — ED Notes (Signed)
Pt given po fluids, tolerated well without nausea or vomiting.

## 2021-03-02 NOTE — ED Notes (Signed)
Assisted Dr. Christy Gentles with rectal exam.  Linens and adult diaper changed with pericare performed.

## 2021-03-02 NOTE — Discharge Instructions (Signed)
Please have his primary doctor or a CAT scan of his chest in the next month to rule out lung cancer as he has a lung nodule in his right lung

## 2021-03-02 NOTE — ED Notes (Signed)
Care Handoff Report given to [place], [place]. Waiting on [facility] for transport back to facility.

## 2021-03-02 NOTE — ED Provider Notes (Signed)
[name] EMERGENCY DEPARTMENT Provider Note   CSN: [id number] Arrival date & time: 03/01/21  2212     History Chief Complaint  Patient presents with  . Emesis  Level 5 caveat due to dementia  [name] is a 75 y.o. male.  The history is provided by the patient. The history is limited by the condition of the patient.  Emesis Severity:  Moderate Timing:  Intermittent Progression:  Worsening Chronicity:  New Relieved by:  Nothing Worsened by:  Nothing Associated symptoms: no abdominal pain, no diarrhea and no fever   Patient presents from [facility] home[name] Patient with history of dementia, diabetes, cecal cancer presents with vomiting.  Per nursing staff at facility, patient has had vomiting throughout the day.  He had a temperature up to 100.1.  He also had a pulse ox of 85% on room air and he is not on oxygen.  Patient reports he has had vomiting all day.  He denies any known change in his bowel movements.  No chest pain or abdominal pain    Past Medical History:  Diagnosis Date  . Bell's palsy   . Bell's palsy   . Bleeding ulcer    remote past  . 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 Dementia    disoriented to time.does not know medical hx.information obtained from previous charts  . Diabetes mellitus without complication ([place])   . GERD (gastroesophageal reflux disease)   . Hard of hearing   . Hyperlipemia   . Hypertension   . Iron deficiency anemia   . Obesity   . SOB (shortness of breath) on exertion     Patient Active Problem List   Diagnosis Date Noted  . H/O colon cancer, stage III 12/30/2020  . Elevated LFTs   . Pressure injury of skin 09/07/2020  . Bacteremia due to Klebsiella pneumoniae 09/06/2020  . AKI (acute kidney injury) ([place]) 09/06/2020  . Lactic acidosis 09/06/2020  . Mild protein-calorie malnutrition ([place]) 09/06/2020  . Septic shock ([place]) 09/06/2020  . Cecal cancer ([place])   . Colonic mass 05/09/2019  . atrophic Gastritis  05/08/2019  . Abdominal pain 05/08/2019  . Dementia   . SOB (shortness of breath) on exertion   . Hypotension   . Acute blood loss anemia 05/01/2018  . Heme positive stool 05/01/2018  . Guaiac positive stools   . Iron deficiency anemia due to chronic blood loss   . GIB (gastrointestinal bleeding) 04/30/2018  . History of Bleeding ulcer   . Essential hypertension   . Encounter for screening colonoscopy 10/08/2011  . CLOSED DISLOCATION OF FOOT UNSPECIFIED PART 01/15/2010    Past Surgical History:  Procedure Laterality Date  . BIOPSY  05/09/2019   Procedure: BIOPSY;  Surgeon: Daneil Dolin, MD;  Location: [facility];  Service: Endoscopy;;  . COLONOSCOPY  10/06/06   internal hemorrhoids and anal papilla/poor prep  . 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 CYSTOSCOPY W/ URETERAL STENT PLACEMENT Right 09/08/2020   Procedure: CYSTOSCOPY WITH RETROGRADE PYELOGRAM/URETERAL STENT PLACEMENT;  Surgeon: Irine Seal, MD;  Location: [facility] [facility];  Service: Urology;  Laterality: Right;  . CYSTOSCOPY/URETEROSCOPY/HOLMIUM LASER/STENT PLACEMENT Right 09/30/2020   Procedure: CYSTOSCOPY RIGHT URETEROSCOPY/HOLMIUM LASER/STENT PLACEMENT;  Surgeon: Irine Seal, MD;  Location: [facility];  Service: Urology;  Laterality: Right;  . ESOPHAGOGASTRODUODENOSCOPY N/A 05/01/2018   multiple 3-6 mm gastric nodules and gastritis, surrounding telangectasias. Biopsy with atrophic gastritis.   Marland Kitchen FOOT SURGERY    .  KNEE ARTHROSCOPY  06/17/2011   Procedure: ARTHROSCOPY KNEE;  Surgeon: Sanjuana Kava;  Location: [facility];  Service: Orthopedics;  Laterality: Right;  . LAPAROSCOPIC PARTIAL COLECTOMY N/A 05/11/2019   Procedure: ATTEMPTED LAPAROSCOPIC PARTIAL COLECTOMY;  Surgeon: [name], MD;  Location: [facility];  Service: General;  Laterality: N/A;  . PARTIAL COLECTOMY  05/11/2019   Procedure: PARTIAL COLECTOMY (Laparotomy incision at 0037;  Surgeon: [name], MD;  Location: [facility];  Service: General;;   . POLYPECTOMY  05/09/2019   Procedure: POLYPECTOMY;  Surgeon: Daneil Dolin, MD;  [facility];  Service: Endoscopy;;       Family History  Problem Relation Age of Onset  . COPD Mother   . Cancer Brother   . Anesthesia problems Neg Hx   . Hypotension Neg Hx   . Malignant hyperthermia Neg Hx   . Pseudochol deficiency Neg Hx   . Colon cancer Neg Hx     Social History   Tobacco Use  . Smoking status: Former Smoker    Packs/day: 1.00    Years: 30.00    Pack years: 30.00    Types: Cigarettes  . Smokeless tobacco: Never Used  . Tobacco comment: quit a "long time ago"   Vaping Use  . Vaping Use: Never used  Substance Use Topics  . Alcohol use: No  . Drug use: No    Home Medications Prior to Admission medications   Medication Sig Start Date End Date Taking? Authorizing Provider  acetaminophen (TYLENOL) 650 MG CR tablet Take 1 tablet (650 mg total) by mouth every 8 (eight) hours as needed for pain or fever. 09/11/20   Hongalgi, Lenis Dickinson, MD  allopurinol (ZYLOPRIM) 100 MG tablet Take 100 mg by mouth daily.    [provider]  atenolol (TENORMIN) 25 MG tablet Take 25 mg by mouth daily.     [provider]  atorvastatin (LIPITOR) 10 MG tablet Take 5 mg by mouth at bedtime.     [provider]  carboxymethylcellulose (REFRESH PLUS) 0.5 % SOLN Place 1 drop into both eyes every 6 (six) hours as needed (Dry eye).     [provider]  cholecalciferol (VITAMIN D3) 25 MCG (1000 UNIT) tablet Take 1,000 Units by mouth daily.    [provider]  Colloidal Oatmeal (EUCERIN ECZEMA RELIEF) 1 % CREA Apply 1 application topically 2 (two) times daily. Apply to face and neck    [provider]  cycloSPORINE (RESTASIS) 0.05 % ophthalmic emulsion Place 1 drop into both eyes 2 (two) times daily.    [provider]  feeding supplement (ENSURE ENLIVE / ENSURE PLUS) LIQD Take 237 mLs by mouth 2 (two) times daily between meals.  09/11/20   Hongalgi, Lenis Dickinson, MD  ferrous sulfate 325 (65 FE) MG EC tablet Take 325 mg by mouth daily at 12 noon.    [provider]  furosemide (LASIX) 40 MG tablet Take 40 mg by mouth daily.    [provider]  ketoconazole (NIZORAL) 2 % shampoo Apply 1 application topically every 12 (twelve) hours as needed for irritation. Apply to Scalp    [provider]  metFORMIN (GLUCOPHAGE) 500 MG tablet Take 500 mg by mouth at bedtime.    [provider]  mometasone (ELOCON) 0.1 % cream Apply 1 application topically daily as needed (Dry skin).     [provider]  Multiple Vitamin (MULTIVITAMIN) tablet Take 1 tablet by mouth daily.    [provider]  Omega-3 Fatty Acids (FISH OIL) 1000 MG CAPS Take 2,000 mg by mouth 2 (two) times daily.     [provider]  potassium chloride (KLOR-CON) 20 MEQ packet Take 20 mEq by mouth 2 (two) times daily.     [provider]  Skin Protectants, Misc. (EUCERIN) cream Apply 1 application topically daily. Apply to feet    [provider]  vitamin B-12 (CYANOCOBALAMIN) 1000 MCG tablet Take 1,000 mcg by mouth daily.    [provider]    Allergies    Hydrocodone, Hydrocodone-acetaminophen, and Aplisol [tuberculin ppd]  Review of Systems   Review of Systems  Unable to perform ROS: Dementia  Constitutional: Negative for fever.  Gastrointestinal: Positive for vomiting. Negative for abdominal pain and diarrhea.    Physical Exam Updated Vital Signs BP 137/67   Pulse 85   Temp 98.3 F (36.8 C) (Oral)   Resp 20   Ht 1.829 m (6')   Wt 105 kg   SpO2 91%   BMI 31.39 kg/m   Physical Exam CONSTITUTIONAL: Elderly, chronically ill-appearing HEAD: Normocephalic/atraumatic EYES: EOMI/PERRL ENMT: Mucous membranes moist NECK: supple no meningeal signs SPINE/BACK:entire spine nontender CV: S1/S2 noted, no murmurs/rubs/gallops noted LUNGS: Lungs are clear to auscultation  bilaterally, no apparent distress ABDOMEN: soft, nontender, nondistended no rebound or guarding, bowel sounds noted throughout abdomen Rectal-mild stool impaction noted, no blood or melena noted no rectal mass, nurse chaperone present for exam GU:no cva tenderness NEURO: Pt is awake/alert EXTREMITIES: pulses normal/equal, no deformities SKIN: warm, color normal  ED Results / Procedures / Treatments   Labs (all labs ordered are listed, but only abnormal results are displayed) Labs Reviewed  COMPREHENSIVE METABOLIC PANEL - Abnormal; Notable for the following components:      Result Value   Glucose, Bld 135 (*)    BUN 50 (*)    Creatinine, Ser 1.41 (*)    Albumin 3.4 (*)    Total Bilirubin 1.5 (*)    GFR, Estimated 52 (*)    All other components within normal limits  URINALYSIS, ROUTINE W REFLEX MICROSCOPIC - Abnormal; Notable for the following components:   APPearance CLOUDY (*)    Hgb urine dipstick SMALL (*)    Ketones, ur 5 (*)    Nitrite POSITIVE (*)    Leukocytes,Ua LARGE (*)    WBC, UA >50 (*)    Bacteria, UA MANY (*)    All other components within normal limits  URINE CULTURE  LIPASE, BLOOD  CBC  URINALYSIS, ROUTINE W REFLEX MICROSCOPIC    EKG EKG Interpretation  Date/Time:  Sunday March 01 2021 22:21:25 EDT Ventricular Rate:  89 PR Interval:  145 QRS Duration: 93 QT Interval:  383 QTC Calculation: 466 R Axis:   -48 Text Interpretation: Sinus rhythm Ventricular premature complex Left anterior fascicular block Abnormal R-wave progression, early transition Confirmed by Ripley Fraise [phone]) on 03/02/2021 12:35:42 AM   Radiology DG ABD ACUTE 2+V W 1V CHEST  Result Date: 03/01/2021 CLINICAL DATA:  Abdominal pain EXAM: DG ABDOMEN ACUTE WITH 1 VIEW CHEST COMPARISON:  None. FINDINGS: Nonspecific focal opacity in the right mid lung. No focal airspace consolidation. No pleural effusion or pneumothorax. Stool ball noted in the rectum. There is prominent small bowel in  the right lower quadrant. IMPRESSION: 1. Nonspecific focal opacity in the right mid lung. This may indicate scarring/atelectasis or a pulmonary nodule. Follow-up chest radiograph or chest CT is recommended. 2. Stool ball in the rectum with prominent small  bowel in the right lower quadrant. Electronically Signed   By: Ulyses Jarred M.D.   On: 03/01/2021 23:49    Procedures Procedures   Medications Ordered in ED Medications  ondansetron [facility]) injection 4 mg (4 mg Intravenous Given 03/01/21 2306)  ondansetron (ZOFRAN) injection 4 mg (4 mg Intravenous Given 03/01/21 2345)  sodium chloride 0.9 % bolus 1,000 mL (0 mLs Intravenous Stopped 03/02/21 0111)  sodium chloride 0.9 % bolus 1,000 mL (0 mLs Intravenous Stopped 03/02/21 0308)  cefTRIAXone (ROCEPHIN) 1 g in sodium chloride 0.9 % 100 mL IVPB (0 g Intravenous Stopped 03/02/21 0338)    ED Course  I have reviewed the triage vital signs and the nursing notes.  Pertinent labs & imaging results that were available during my care of the patient were reviewed by me and considered in my medical decision making (see chart for details).    MDM Rules/Calculators/A&P                          1:08 AM Patient presents from nursing facility for vomiting.  He does appear dehydrated Will give IV fluids.  At this point, he has no focal abdominal tenderness and appears comfortable, will defer CT imaging 3:51 AM Patient is improved.  He is taking p.o. fluids without difficulty.  He denies any pain complaints.  No focal abdominal tenderness, and he has appropriate bowel sounds throughout on exam. I have low suspicion for bowel obstruction or other acute abdominal emergency at this time Patient has had intermittent episodes of mild hypoxia to the low 90s, but he is not taking deep breaths and it easily corrects. He is in no distress at this time  Patient was incontinent of urine while in the ED, therefore In-N-Out cath was performed. Patient found to have a urinary  tract infection & was given Rocephin. He reports he can tolerate oral meds, will prescribe Keflex for 1 week.  Patient also noted to have a lung nodule on x-ray, he will need to have a CAT scan by his primary doctor to rule out cancer as this was listed in his paperwork Final Clinical Impression(s) / ED Diagnoses Final diagnoses:  Non-intractable vomiting with nausea, unspecified vomiting type  Dehydration  Acute cystitis without hematuria  Pulmonary nodule    Rx / DC Orders ED Discharge Orders         Ordered    cephALEXin (KEFLEX) 500 MG capsule  2 times daily        03/02/21 0350           Ripley Fraise, MD 03/02/21 [phone]

## 2021-03-02 NOTE — ED Notes (Signed)
Bladder scan 506 In/out cath 450

## 2021-03-03 ENCOUNTER — Inpatient Hospital Stay (HOSPITAL_COMMUNITY): Payer: Medicare (Managed Care)

## 2021-03-03 ENCOUNTER — Other Ambulatory Visit: Payer: Self-pay

## 2021-03-03 ENCOUNTER — Emergency Department (HOSPITAL_COMMUNITY): Payer: Medicare (Managed Care)

## 2021-03-03 ENCOUNTER — Inpatient Hospital Stay (HOSPITAL_COMMUNITY)
Admission: EM | Admit: 2021-03-03 | Discharge: 2021-03-09 | DRG: 871 | Disposition: A | Discharge status: Skilled Nursing Facility | Payer: Medicare (Managed Care) | Source: Skilled Nursing Facility | Attending: Family Medicine | Admitting: Family Medicine

## 2021-03-03 ENCOUNTER — Encounter (HOSPITAL_COMMUNITY): Payer: Self-pay | Admitting: Emergency Medicine

## 2021-03-03 DIAGNOSIS — F0390 Unspecified dementia without behavioral disturbance: Secondary | ICD-10-CM | POA: Diagnosis not present

## 2021-03-03 DIAGNOSIS — I10 Essential (primary) hypertension: Secondary | ICD-10-CM | POA: Diagnosis present

## 2021-03-03 DIAGNOSIS — E860 Dehydration: Secondary | ICD-10-CM | POA: Diagnosis present

## 2021-03-03 DIAGNOSIS — J9611 Chronic respiratory failure with hypoxia: Secondary | ICD-10-CM | POA: Diagnosis present

## 2021-03-03 DIAGNOSIS — Z6831 Body mass index (BMI) 31.0-31.9, adult: Secondary | ICD-10-CM | POA: Diagnosis not present

## 2021-03-03 DIAGNOSIS — Z79899 Other long term (current) drug therapy: Secondary | ICD-10-CM

## 2021-03-03 DIAGNOSIS — E876 Hypokalemia: Secondary | ICD-10-CM | POA: Diagnosis not present

## 2021-03-03 DIAGNOSIS — K3184 Gastroparesis: Secondary | ICD-10-CM | POA: Diagnosis present

## 2021-03-03 DIAGNOSIS — E870 Hyperosmolality and hypernatremia: Secondary | ICD-10-CM | POA: Diagnosis not present

## 2021-03-03 DIAGNOSIS — Z9049 Acquired absence of other specified parts of digestive tract: Secondary | ICD-10-CM

## 2021-03-03 DIAGNOSIS — J189 Pneumonia, unspecified organism: Secondary | ICD-10-CM | POA: Diagnosis present

## 2021-03-03 DIAGNOSIS — K567 Ileus, unspecified: Secondary | ICD-10-CM

## 2021-03-03 DIAGNOSIS — E785 Hyperlipidemia, unspecified: Secondary | ICD-10-CM | POA: Diagnosis present

## 2021-03-03 DIAGNOSIS — Z888 Allergy status to other drugs, medicaments and biological substances status: Secondary | ICD-10-CM

## 2021-03-03 DIAGNOSIS — Z7984 Long term (current) use of oral hypoglycemic drugs: Secondary | ICD-10-CM

## 2021-03-03 DIAGNOSIS — Z85038 Personal history of other malignant neoplasm of large intestine: Secondary | ICD-10-CM | POA: Diagnosis not present

## 2021-03-03 DIAGNOSIS — K219 Gastro-esophageal reflux disease without esophagitis: Secondary | ICD-10-CM | POA: Diagnosis present

## 2021-03-03 DIAGNOSIS — E441 Mild protein-calorie malnutrition: Secondary | ICD-10-CM

## 2021-03-03 DIAGNOSIS — Z87891 Personal history of nicotine dependence: Secondary | ICD-10-CM

## 2021-03-03 DIAGNOSIS — E1143 Type 2 diabetes mellitus with diabetic autonomic (poly)neuropathy: Secondary | ICD-10-CM | POA: Diagnosis present

## 2021-03-03 DIAGNOSIS — E872 Acidosis, unspecified: Secondary | ICD-10-CM | POA: Diagnosis present

## 2021-03-03 DIAGNOSIS — K56609 Unspecified intestinal obstruction, unspecified as to partial versus complete obstruction: Secondary | ICD-10-CM | POA: Diagnosis present

## 2021-03-03 DIAGNOSIS — J690 Pneumonitis due to inhalation of food and vomit: Secondary | ICD-10-CM | POA: Diagnosis present

## 2021-03-03 DIAGNOSIS — Z885 Allergy status to narcotic agent status: Secondary | ICD-10-CM

## 2021-03-03 DIAGNOSIS — A419 Sepsis, unspecified organism: Principal | ICD-10-CM | POA: Diagnosis present

## 2021-03-03 DIAGNOSIS — Z20822 Contact with and (suspected) exposure to covid-19: Secondary | ICD-10-CM | POA: Diagnosis present

## 2021-03-03 DIAGNOSIS — N200 Calculus of kidney: Secondary | ICD-10-CM | POA: Diagnosis present

## 2021-03-03 DIAGNOSIS — N390 Urinary tract infection, site not specified: Secondary | ICD-10-CM | POA: Diagnosis not present

## 2021-03-03 DIAGNOSIS — Z0189 Encounter for other specified special examinations: Secondary | ICD-10-CM

## 2021-03-03 DIAGNOSIS — D509 Iron deficiency anemia, unspecified: Secondary | ICD-10-CM | POA: Diagnosis present

## 2021-03-03 LAB — CBC WITH DIFFERENTIAL/PLATELET
Abs Immature Granulocytes: 0.13 10*3/uL — ABNORMAL HIGH (ref 0.00–0.07)
Basophils Absolute: 0.1 10*3/uL (ref 0.0–0.1)
Basophils Relative: 1 %
Eosinophils Absolute: 0 10*3/uL (ref 0.0–0.5)
Eosinophils Relative: 0 %
HCT: 47.5 % (ref 39.0–52.0)
Hemoglobin: 15.2 g/dL (ref 13.0–17.0)
Immature Granulocytes: 1 %
Lymphocytes Relative: 9 %
Lymphs Abs: 1.2 10*3/uL (ref 0.7–4.0)
MCH: 29.9 pg (ref 26.0–34.0)
MCHC: 32 g/dL (ref 30.0–36.0)
MCV: 93.3 fL (ref 80.0–100.0)
Monocytes Absolute: 1.2 10*3/uL — ABNORMAL HIGH (ref 0.1–1.0)
Monocytes Relative: 9 %
Neutro Abs: 10.7 10*3/uL — ABNORMAL HIGH (ref 1.7–7.7)
Neutrophils Relative %: 80 %
Platelets: 271 10*3/uL (ref 150–400)
RBC: 5.09 MIL/uL (ref 4.22–5.81)
RDW: 14 % (ref 11.5–15.5)
WBC: 13.3 10*3/uL — ABNORMAL HIGH (ref 4.0–10.5)
nRBC: 0 % (ref 0.0–0.2)

## 2021-03-03 LAB — RESP PANEL BY RT-PCR (FLU A&B, COVID) ARPGX2
Influenza A by PCR: NEGATIVE
Influenza B by PCR: NEGATIVE
SARS Coronavirus 2 by RT PCR: NEGATIVE

## 2021-03-03 LAB — URINALYSIS, ROUTINE W REFLEX MICROSCOPIC
Bilirubin Urine: NEGATIVE
Glucose, UA: NEGATIVE mg/dL
Ketones, ur: 5 mg/dL — AB
Nitrite: NEGATIVE
Protein, ur: NEGATIVE mg/dL
Specific Gravity, Urine: 1.019 (ref 1.005–1.030)
WBC, UA: >50 WBC/hpf — ABNORMAL HIGH (ref 0–5)
pH: 5 (ref 5.0–8.0)

## 2021-03-03 LAB — COMPREHENSIVE METABOLIC PANEL
ALT: 17 U/L (ref 0–44)
AST: 17 U/L (ref 15–41)
Albumin: 3.4 g/dL — ABNORMAL LOW (ref 3.5–5.0)
Alkaline Phosphatase: 77 U/L (ref 38–126)
Anion gap: 15 (ref 5–15)
BUN: 67 mg/dL — ABNORMAL HIGH (ref 8–23)
CO2: 22 mmol/L (ref 22–32)
Calcium: 9.4 mg/dL (ref 8.9–10.3)
Chloride: 107 mmol/L (ref 98–111)
Creatinine, Ser: 1.26 mg/dL — ABNORMAL HIGH (ref 0.61–1.24)
GFR, Estimated: 59 mL/min — ABNORMAL LOW (ref >60)
Glucose, Bld: 164 mg/dL — ABNORMAL HIGH (ref 70–99)
Potassium: 4.1 mmol/L (ref 3.5–5.1)
Sodium: 144 mmol/L (ref 135–145)
Total Bilirubin: 1.3 mg/dL — ABNORMAL HIGH (ref 0.3–1.2)
Total Protein: 7.5 g/dL (ref 6.5–8.1)

## 2021-03-03 LAB — LACTIC ACID, PLASMA
Lactic Acid, Venous: 3.3 mmol/L (ref 0.5–1.9)
Lactic Acid, Venous: 3.6 mmol/L (ref 0.5–1.9)
Lactic Acid, Venous: 3.2 mmol/L (ref 0.5–1.9)
Lactic Acid, Venous: 1.9 mmol/L (ref 0.5–1.9)

## 2021-03-03 LAB — GLUCOSE, CAPILLARY
Glucose-Capillary: 131 mg/dL — ABNORMAL HIGH (ref 70–99)
Glucose-Capillary: 126 mg/dL — ABNORMAL HIGH (ref 70–99)

## 2021-03-03 LAB — PROTIME-INR
INR: 1.2 (ref 0.8–1.2)
Prothrombin Time: 15.3 seconds — ABNORMAL HIGH (ref 11.4–15.2)

## 2021-03-03 LAB — CBG MONITORING, ED: Glucose-Capillary: 159 mg/dL — ABNORMAL HIGH (ref 70–99)

## 2021-03-03 LAB — MRSA PCR SCREENING: MRSA by PCR: NEGATIVE

## 2021-03-03 MED ORDER — SODIUM CHLORIDE 0.9 % IV BOLUS
1000.0000 mL | Freq: Once | INTRAVENOUS | Status: AC
  Administered 2021-03-03: 1000 mL via INTRAVENOUS

## 2021-03-03 MED ORDER — SODIUM CHLORIDE 0.9 % IV SOLN
2.0000 g | Freq: Once | INTRAVENOUS | Status: AC
  Administered 2021-03-03: 2 g via INTRAVENOUS
  Filled 2021-03-03: qty 2

## 2021-03-03 MED ORDER — ACETAMINOPHEN 650 MG RE SUPP: 650.0000 mg | Freq: Four times a day (QID) | RECTAL | Status: DC | PRN

## 2021-03-03 MED ORDER — PANTOPRAZOLE SODIUM 40 MG IV SOLR
40.0000 mg | INTRAVENOUS | Status: DC
  Administered 2021-03-03 – 2021-03-08 (×6): 40 mg via INTRAVENOUS
  Filled 2021-03-03 (×7): qty 40

## 2021-03-03 MED ORDER — SODIUM CHLORIDE 0.9 % IV SOLN: 2.0000 g | INTRAVENOUS | Status: DC

## 2021-03-03 MED ORDER — SODIUM CHLORIDE 0.9 % IV SOLN: 2.0000 g | Freq: Three times a day (TID) | INTRAVENOUS | Status: DC

## 2021-03-03 MED ORDER — LACTATED RINGERS IV SOLN: INTRAVENOUS | Status: DC

## 2021-03-03 MED ORDER — LACTATED RINGERS IV BOLUS (SEPSIS): 1000.0000 mL | Freq: Once | INTRAVENOUS | Status: DC

## 2021-03-03 MED ORDER — ONDANSETRON HCL 4 MG PO TABS: 4.0000 mg | ORAL_TABLET | Freq: Four times a day (QID) | ORAL | Status: DC | PRN

## 2021-03-03 MED ORDER — PIPERACILLIN-TAZOBACTAM 3.375 G IVPB
3.3750 g | Freq: Three times a day (TID) | INTRAVENOUS | Status: AC
  Administered 2021-03-03 – 2021-03-08 (×16): 3.375 g via INTRAVENOUS
  Filled 2021-03-03 (×16): qty 50

## 2021-03-03 MED ORDER — VANCOMYCIN HCL 2000 MG/400ML IV SOLN
2000.0000 mg | Freq: Once | INTRAVENOUS | Status: AC
  Administered 2021-03-03: 2000 mg via INTRAVENOUS
  Filled 2021-03-03: qty 400

## 2021-03-03 MED ORDER — SODIUM CHLORIDE 0.9 % IV SOLN: 500.0000 mg | INTRAVENOUS | Status: DC

## 2021-03-03 MED ORDER — ACETAMINOPHEN 325 MG PO TABS: 650.0000 mg | ORAL_TABLET | Freq: Four times a day (QID) | ORAL | Status: DC | PRN

## 2021-03-03 MED ORDER — CYCLOSPORINE 0.05 % OP EMUL
1.0000 [drp] | Freq: Two times a day (BID) | OPHTHALMIC | Status: DC
  Administered 2021-03-03 – 2021-03-09 (×12): 1 [drp] via OPHTHALMIC
  Filled 2021-03-03 (×9): qty 1

## 2021-03-03 MED ORDER — ENOXAPARIN SODIUM 40 MG/0.4ML IJ SOSY
40.0000 mg | PREFILLED_SYRINGE | INTRAMUSCULAR | Status: DC
  Administered 2021-03-03 – 2021-03-08 (×6): 40 mg via SUBCUTANEOUS
  Filled 2021-03-03 (×6): qty 0.4

## 2021-03-03 MED ORDER — ONDANSETRON HCL 4 MG/2ML IJ SOLN
4.0000 mg | Freq: Once | INTRAMUSCULAR | Status: AC
  Administered 2021-03-03: 4 mg via INTRAVENOUS
  Filled 2021-03-03: qty 2

## 2021-03-03 MED ORDER — VANCOMYCIN HCL IN DEXTROSE 1-5 GM/200ML-% IV SOLN: 1000.0000 mg | Freq: Two times a day (BID) | INTRAVENOUS | Status: DC

## 2021-03-03 MED ORDER — ONDANSETRON HCL 4 MG/2ML IJ SOLN: 4.0000 mg | Freq: Four times a day (QID) | INTRAMUSCULAR | Status: DC | PRN

## 2021-03-03 MED ORDER — VANCOMYCIN HCL IN DEXTROSE 1-5 GM/200ML-% IV SOLN
1000.0000 mg | Freq: Once | INTRAVENOUS | Status: DC
  Filled 2021-03-03: qty 200

## 2021-03-03 MED ORDER — SODIUM CHLORIDE 0.9 % IV BOLUS
2000.0000 mL | Freq: Once | INTRAVENOUS | Status: AC
  Administered 2021-03-03: 2000 mL via INTRAVENOUS

## 2021-03-03 NOTE — Progress Notes (Signed)
Notified bedside nurse to find out if second [place] has been drawn.

## 2021-03-03 NOTE — ED Provider Notes (Signed)
[facility] EMERGENCY DEPARTMENT Provider Note   CSN: [id number] Arrival date & time: 03/03/21  1136     History Chief Complaint  Patient presents with  . Fatigue    [name] is a 75 y.o. male.  Who presents emergency department with a chief complaint of altered mental status.  Patient arrives from [place] due to change in mental status.  There is a level 5 caveat due to dementia.  He was seen overnight for vomiting and had worsening in his condition, review of EMR shows that the patient has positive urinary tract infection.  He has baseline chronic hypoxic respiratory failure on 3 L currently requiring 4.  Patient meets sepsis criteria with rectal temperature of 100.7, tachypnea, tachycardia.  s        Past Medical History:  Diagnosis Date  . Bell's palsy   . Bell's palsy   . Bleeding ulcer    remote past  . 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 Dementia    disoriented to time.does not know medical hx.information obtained from previous charts  . Diabetes mellitus without complication ([place])   . GERD (gastroesophageal reflux disease)   . Hard of hearing   . Hyperlipemia   . Hypertension   . Iron deficiency anemia   . Obesity   . SOB (shortness of breath) on exertion     Patient Active Problem List   Diagnosis Date Noted  . H/O colon cancer, stage III 12/30/2020  . Elevated LFTs   . Pressure injury of skin 09/07/2020  . Bacteremia due to Klebsiella pneumoniae 09/06/2020  . AKI (acute kidney injury) ([place]) 09/06/2020  . Lactic acidosis 09/06/2020  . Mild protein-calorie malnutrition ([place]) 09/06/2020  . Septic shock ([place]) 09/06/2020  . Cecal cancer ([place])   . Colonic mass 05/09/2019  . 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 Abdominal pain 05/08/2019  . Dementia   . SOB (shortness of breath) on exertion   . Hypotension   . Acute blood loss anemia 05/01/2018  . Heme positive stool 05/01/2018  . Guaiac positive stools   . Iron deficiency anemia due to chronic  blood loss   . GIB (gastrointestinal bleeding) 04/30/2018  . History of Bleeding ulcer   . Essential hypertension   . Encounter for screening colonoscopy 10/08/2011  . CLOSED DISLOCATION OF FOOT UNSPECIFIED PART 01/15/2010    Past Surgical History:  Procedure Laterality Date  . BIOPSY  05/09/2019   Procedure: BIOPSY;  Surgeon: Daneil Dolin, MD;  Location: [facility];  Service: Endoscopy;;  . COLONOSCOPY  10/06/06   internal hemorrhoids and anal papilla/poor prep  . 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 CYSTOSCOPY W/ URETERAL STENT PLACEMENT Right 09/08/2020   Procedure: CYSTOSCOPY WITH RETROGRADE PYELOGRAM/URETERAL STENT PLACEMENT;  Surgeon: Irine Seal, MD;  Location: [facility];  Service: Urology;  Laterality: Right;  . CYSTOSCOPY/URETEROSCOPY/HOLMIUM LASER/STENT PLACEMENT Right 09/30/2020   Procedure: CYSTOSCOPY RIGHT URETEROSCOPY/HOLMIUM LASER/STENT PLACEMENT;  Surgeon: Irine Seal, MD;  Location: [facility];  Service: Urology;  Laterality: Right;  . ESOPHAGOGASTRODUODENOSCOPY N/A 05/01/2018   multiple 3-6 mm gastric nodules and gastritis, surrounding telangectasias. Biopsy with atrophic gastritis.   Marland Kitchen FOOT SURGERY    . KNEE ARTHROSCOPY  06/17/2011   Procedure: ARTHROSCOPY KNEE;  Surgeon: Sanjuana Kava;  Location: [facility];  Service: Orthopedics;  Laterality: Right;  . LAPAROSCOPIC PARTIAL COLECTOMY N/A 05/11/2019   Procedure: ATTEMPTED LAPAROSCOPIC PARTIAL COLECTOMY;  Surgeon: [name], MD;  Location: [facility];  Service: General;  Laterality: N/A;  . PARTIAL COLECTOMY  05/11/2019   Procedure: PARTIAL COLECTOMY (Laparotomy incision at 0837;  Surgeon: [name], MD;  Location: [facility];  Service: General;;  . POLYPECTOMY  05/09/2019   Procedure: POLYPECTOMY;  Surgeon: Daneil Dolin, MD;  [facility];  Service: Endoscopy;;       Family History  Problem Relation Age of Onset  . COPD Mother   . Cancer Brother   . Anesthesia problems Neg Hx    . Hypotension Neg Hx   . Malignant hyperthermia Neg Hx   . Pseudochol deficiency Neg Hx   . Colon cancer Neg Hx     Social History   Tobacco Use  . Smoking status: Former Smoker    Packs/day: 1.00    Years: 30.00    Pack years: 30.00    Types: Cigarettes  . Smokeless tobacco: Never Used  . Tobacco comment: quit a "long time ago"   Vaping Use  . Vaping Use: Never used  Substance Use Topics  . Alcohol use: No  . Drug use: No    Home Medications Prior to Admission medications   Medication Sig Start Date End Date Taking? Authorizing Provider  acetaminophen (TYLENOL) 650 MG CR tablet Take 1 tablet (650 mg total) by mouth every 8 (eight) hours as needed for pain or fever. 09/11/20   Hongalgi, Lenis Dickinson, MD  allopurinol (ZYLOPRIM) 100 MG tablet Take 100 mg by mouth daily.    [provider]  atenolol (TENORMIN) 25 MG tablet Take 25 mg by mouth daily.     [provider]  atorvastatin (LIPITOR) 10 MG tablet Take 5 mg by mouth at bedtime.     [provider]  carboxymethylcellulose (REFRESH PLUS) 0.5 % SOLN Place 1 drop into both eyes every 6 (six) hours as needed (Dry eye).     [provider]  cephALEXin (KEFLEX) 500 MG capsule Take 1 capsule (500 mg total) by mouth 2 (two) times daily. 03/02/21   Ripley Fraise, MD  cholecalciferol (VITAMIN D3) 25 MCG (1000 UNIT) tablet Take 1,000 Units by mouth daily.    [provider]  Colloidal Oatmeal (EUCERIN ECZEMA RELIEF) 1 % CREA Apply 1 application topically 2 (two) times daily. Apply to face and neck    [provider]  cycloSPORINE (RESTASIS) 0.05 % ophthalmic emulsion Place 1 drop into both eyes 2 (two) times daily.    [provider]  feeding supplement (ENSURE ENLIVE / ENSURE PLUS) LIQD Take 237 mLs by mouth 2 (two) times daily between meals. 09/11/20   Hongalgi, Lenis Dickinson, MD  ferrous sulfate 325 (65 FE) MG EC tablet Take 325 mg by mouth daily at 12 noon.    [provider]  furosemide (LASIX) 40 MG tablet Take 40 mg by mouth daily.    [provider]  ketoconazole (NIZORAL) 2 % shampoo Apply 1 application topically every 12 (twelve) hours as needed for irritation. Apply to Scalp    [provider]  metFORMIN (GLUCOPHAGE) 500 MG tablet Take 500 mg by mouth at bedtime.    [provider]  mometasone (ELOCON) 0.1 % cream Apply 1 application topically daily as needed (Dry skin).     [provider]  Multiple Vitamin (MULTIVITAMIN) tablet Take 1 tablet by mouth daily.    [provider]  Omega-3 Fatty Acids (FISH OIL) 1000 MG CAPS Take 2,000 mg by mouth 2 (two) times daily.  [provider]  potassium chloride (KLOR-CON) 20 MEQ packet Take 20 mEq by mouth 2 (two) times daily.     [provider]  Skin Protectants, Misc. (EUCERIN) cream Apply 1 application topically daily. Apply to feet    [provider]  vitamin B-12 (CYANOCOBALAMIN) 1000 MCG tablet Take 1,000 mcg by mouth daily.    [provider]    Allergies    Hydrocodone, Hydrocodone-acetaminophen, and Aplisol [tuberculin ppd]  Review of Systems   Review of Systems  Unable to review systems due to dementia. Physical Exam Updated Vital Signs BP 125/76   Pulse (!) 104   Temp (!) 100.7 F (38.2 C) (Rectal)   Resp (!) 33   Ht 6' (1.829 m)   Wt 105 kg   SpO2 91%   BMI 31.39 kg/m   Physical Exam Vitals and nursing note reviewed.  Constitutional:      General: He is not in acute distress.    Appearance: He is well-developed. He is not diaphoretic.     Interventions: Nasal cannula in place.  HENT:     Head: Normocephalic and atraumatic.  Eyes:     General: No scleral icterus.    Conjunctiva/sclera: Conjunctivae normal.  Cardiovascular:     Rate and Rhythm: Normal rate and regular rhythm.     Heart sounds: Normal heart sounds.  Pulmonary:     Effort: Pulmonary effort is normal. No respiratory  distress.     Breath sounds: Normal breath sounds.  Abdominal:     General: There is no distension.     Palpations: Abdomen is soft.     Tenderness: There is no abdominal tenderness.  Musculoskeletal:     Cervical back: Normal range of motion and neck supple.  Skin:    General: Skin is warm and dry.  Neurological:     Mental Status: He is easily aroused. He is lethargic.  Psychiatric:        Behavior: Behavior normal.     ED Results / Procedures / Treatments   Labs (all labs ordered are listed, but only abnormal results are displayed) Labs Reviewed  COMPREHENSIVE METABOLIC PANEL - Abnormal; Notable for the following components:      Result Value   Glucose, Bld 164 (*)    BUN 67 (*)    Creatinine, Ser 1.26 (*)    Albumin 3.4 (*)    Total Bilirubin 1.3 (*)    GFR, Estimated 59 (*)    All other components within normal limits  CBC WITH DIFFERENTIAL/PLATELET - Abnormal; Notable for the following components:   WBC 13.3 (*)    Neutro Abs 10.7 (*)    Monocytes Absolute 1.2 (*)    Abs Immature Granulocytes 0.13 (*)    All other components within normal limits  CBG MONITORING, ED - Abnormal; Notable for the following components:   Glucose-Capillary 159 (*)    All other components within normal limits  CULTURE, BLOOD (ROUTINE X 2)  CULTURE, BLOOD (ROUTINE X 2)  RESP PANEL BY RT-PCR (FLU A&B, COVID) ARPGX2  LACTIC ACID, PLASMA  LACTIC ACID, PLASMA  URINALYSIS, ROUTINE W REFLEX MICROSCOPIC  PROTIME-INR    EKG EKG Interpretation  Date/Time:  Tuesday March 03 2021 11:53:36 EDT Ventricular Rate:  104 PR Interval:  141 QRS Duration: 99 QT Interval:  353 QTC Calculation: 465 R Axis:   -40 Text Interpretation: Sinus tachycardia Atrial premature complex Nonspecific ST abnormality Confirmed by Lajean Saver [phone]) on 03/03/2021 12:28:29 PM   Radiology  DG ABD ACUTE 2+V W 1V CHEST  Result Date: 03/01/2021 CLINICAL DATA:  Abdominal pain EXAM: DG ABDOMEN ACUTE WITH 1 VIEW CHEST  COMPARISON:  None. FINDINGS: Nonspecific focal opacity in the right mid lung. No focal airspace consolidation. No pleural effusion or pneumothorax. Stool ball noted in the rectum. There is prominent small bowel in the right lower quadrant. IMPRESSION: 1. Nonspecific focal opacity in the right mid lung. This may indicate scarring/atelectasis or a pulmonary nodule. Follow-up chest radiograph or chest CT is recommended. 2. Stool ball in the rectum with prominent small bowel in the right lower quadrant. Electronically Signed   By: Ulyses Jarred M.D.   On: 03/01/2021 23:49    Procedures .Critical Care Performed by: Margarita Mail, PA-C Authorized by: Margarita Mail, PA-C   Critical care provider statement:    Critical care time (minutes):  50   Critical care time was exclusive of:  Separately billable procedures and treating other patients   Critical care was necessary to treat or prevent imminent or life-threatening deterioration of the following conditions:  Sepsis   Critical care was time spent personally by me on the following activities:  Discussions with consultants, evaluation of patient's response to treatment, examination of patient, ordering and performing treatments and interventions, ordering and review of laboratory studies, ordering and review of radiographic studies, pulse oximetry, re-evaluation of patient's condition, obtaining history from patient or surrogate and review of old charts     Medications Ordered in ED Medications  ceFEPIme (MAXIPIME) 2 g in sodium chloride 0.9 % 100 mL IVPB (2 g Intravenous New Bag/Given 03/03/21 1238)  lactated ringers infusion (has no administration in time range)  vancomycin (VANCOREADY) IVPB 2000 mg/400 mL (2,000 mg Intravenous New Bag/Given 03/03/21 1251)  ondansetron (ZOFRAN) injection 4 mg (4 mg Intravenous Given 03/03/21 1249)  sodium chloride 0.9 % bolus 1,000 mL (1,000 mLs Intravenous New Bag/Given 03/03/21 1237)    ED Course  I have reviewed  the triage vital signs and the nursing notes.  Pertinent labs & imaging results that were available during my care of the patient were reviewed by me and considered in my medical decision making (see chart for details).  Clinical Course as of 03/03/21 2114  Tue Mar 03, 2021  2112 SpO2: 91 % [AH]    Clinical Course User Index [AH] Margarita Mail, PA-C   MDM Rules/Calculators/A&P                          CC:ams, vomiting  JM:EQASTMH is gathered by EMS and EMR.  Previous records obtained and reviewed. DDX:The patient's complaint of altered mental status involves an extensive number of diagnostic and treatment options, and is a complaint that carries with it a high risk of complications, morbidity, and potential mortality. Given the large differential diagnosis, medical decision making is of high complexity. The differential diagnosis for AMS is extensive and includes, but is not limited to: drug overdose - opioids, alcohol, sedatives, antipsychotics, drug withdrawal, others; Metabolic: hypoxia, hypoglycemia, hyperglycemia, hypercalcemia, hypernatremia, hyponatremia, uremia, hepatic encephalopathy, hypothyroidism, hyperthyroidism, vitamin B12 or thiamine deficiency, carbon monoxide poisoning, Wilson's disease, Lactic acidosis, DKA/HHOS; Infectious: meningitis, encephalitis, bacteremia/sepsis, urinary tract infection, pneumonia, neurosyphilis; Structural: Space-occupying lesion, (brain tumor, subdural hematoma, hydrocephalus,); Vascular: stroke, subarachnoid hemorrhage, coronary ischemia, hypertensive encephalopathy, CNS vasculitis, thrombotic thrombocytopenic purpura, disseminated intravascular coagulation, hyperviscosity; Psychiatric: Schizophrenia, depression; Other: Seizure, hypothermia, heat stroke, ICU psychosis, dementia -"sundowning."   Labs: I ordered reviewed and interpreted labs which include CBC  with elevated white blood cell count, left shift, CMP with slightly elevated blood glucose,  baseline right renal insufficiency, urine appears infected, blood cultures drawn, respiratory panel negative for COVID or influenza, prothrombin time slightly elevated, initial lactic acid 3.3. Imaging: I ordered and reviewed images which included 1 view chest x-ray, CT renal stone study. I independently visualized and interpreted all imaging. Significant findings include potential SBO and bibasilar infiltrates versus atelectasis.  EKG: Tachycardia at a rate of 104 Consults: Dr. Arnoldo Morale of surgery who will consult on the patient.  NG tube placed.  Dr. Francine Graven of hospitalist service for admission MDM: Patient here with sepsis likely due to urinary tract infection.  Incidental SBO likely the cause of his vomiting.  Question aspiration pneumonia versus atelectasis.  Patient getting broad-spectrum antibiotics and fluid resuscitation at 30 mL/kg.  He appears otherwise appropriate for admission and is critically ill. Patient disposition:The patient appears reasonably stabilized for admission considering the current resources, flow, and capabilities available in the ED at this time, and I doubt any other [facility] requiring further screening and/or treatment in the ED prior to admission.        Final Clinical Impression(s) / ED Diagnoses Final diagnoses:  Sepsis [facility])    Rx / [place] Orders ED Discharge Orders    None       Margarita Mail, PA-C 03/03/21 2117    Lajean Saver, MD 03/06/21 [phone]

## 2021-03-03 NOTE — H&P (Addendum)
History and Physical    [name] YTK:[id number] DOB: October 09, 1945 DOA: 03/03/2021  PCP: Caprice Renshaw, MD   Patient coming from: SNF  I have personally briefly reviewed patient's old medical records in [place]  Chief Complaint: Sent from the nursing home for evaluation of hypoxia  Most of the history is obtained from the ER notes as patient is unable to provide any history.  HPI: [name] is a 75 y.o. male with medical history significant for dementia, hypertension, GERD, diabetes mellitus who was sent to the ER from the skilled nursing facility for evaluation of lethargy and low O2 sats.  Patient has a history of chronic respiratory failure, wears 3 L of oxygen chronically.  He was noted to have pulse oximetry of 90% upon arrival to triage. Patient was seen in the ER on 03/01/21 for evaluation of emesis and at that time had room air pulse oximetry of 85%.  He was discharged back to the skilled nursing facility on Keflex for presumed UTI. Patient returns to the ER on 06/07 and continues to have emesis.  He had a low-grade fever with a T-max of 100.7 and was tachycardic with heart rate of 107bpm. Labs revealed lactic acidosis and bilateral lower lobe infiltrates. Patient initially required 4 L of oxygen but is back to his baseline 3L. I am unable to do a review of systems on this patient due to his underlying dementia. Labs show sodium 144, potassium 4.1, chloride 107, bicarb 22, glucose 164, BUN 67, creatinine 1.26, calcium 9.4, alkaline phosphatase 77, albumin 3.4, AST 17, ALT 17, total protein 7.5, total bilirubin 1.3, lactic acid 3.3 >> 3.6, white count 13.3, hemoglobin 15.2, hematocrit 47.5, MCV 93.3, RDW 14.0, platelet count 271, PT 15.3, INR 1.2 Respiratory viral panel is negative Chest x-ray reviewed by me shows low lung volumes with bibasilar atelectasis/infiltrates. Gastric distention. Renal stone CT shows moderate bilateral posterior basilar atelectasis or infiltrates are  noted. Moderate gastric distention is noted with proximal small bowel dilatation with possible transition zone seen in right lower quadrant. This is concerning for probable partial small bowel obstruction. Status post partial right colectomy. Bilateral nonobstructive nephrolithiasis. 8 mm nonobstructive calculus seen in right renal pelvis. Aortic Atherosclerosis  Twelve-lead EKG reviewed by me shows sinus tachycardia with PACs.     ED Course: Patient is a 75 year old male who was sent to the ER from the skilled nursing facility for evaluation of hypoxia and fever. Patient has had multiple episodes of emesis and imaging is suggestive of a partial small bowel obstruction. Patient appears to be septic and has a fever, tachycardia, leukocytosis as well as lactic acidosis.  Source of sepsis appears to be bilateral pneumonia concerning for aspiration. He will be admitted to the hospital for further evaluation.    Review of Systems: As per HPI otherwise all other systems reviewed and negative.    Past Medical History:  Diagnosis Date  . Bell's palsy   . Bell's palsy   . Bleeding ulcer    remote past  . 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 Dementia    disoriented to time.does not know medical hx.information obtained from previous charts  . Diabetes mellitus without complication ([place])   . GERD (gastroesophageal reflux disease)   . Hard of hearing   . Hyperlipemia   . Hypertension   . Iron deficiency anemia   . Obesity   . SOB (shortness of breath) on exertion     Past  Surgical History:  Procedure Laterality Date  . BIOPSY  05/09/2019   Procedure: BIOPSY;  Surgeon: Daneil Dolin, MD;  Location: [facility];  Service: Endoscopy;;  . COLONOSCOPY  10/06/06   internal hemorrhoids and anal papilla/poor prep  . 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 CYSTOSCOPY W/ URETERAL STENT PLACEMENT Right 09/08/2020   Procedure: CYSTOSCOPY WITH RETROGRADE  PYELOGRAM/URETERAL STENT PLACEMENT;  Surgeon: Irine Seal, MD;  Location: [facility];  Service: Urology;  Laterality: Right;  . CYSTOSCOPY/URETEROSCOPY/HOLMIUM LASER/STENT PLACEMENT Right 09/30/2020   Procedure: CYSTOSCOPY RIGHT URETEROSCOPY/HOLMIUM LASER/STENT PLACEMENT;  Surgeon: Irine Seal, MD;  Location: [facility];  Service: Urology;  Laterality: Right;  . ESOPHAGOGASTRODUODENOSCOPY N/A 05/01/2018   multiple 3-6 mm gastric nodules and gastritis, surrounding telangectasias. Biopsy with atrophic gastritis.   Marland Kitchen FOOT SURGERY    . KNEE ARTHROSCOPY  06/17/2011   Procedure: ARTHROSCOPY KNEE;  Surgeon: Sanjuana Kava;  Location: [facility];  Service: Orthopedics;  Laterality: Right;  . LAPAROSCOPIC PARTIAL COLECTOMY N/A 05/11/2019   Procedure: ATTEMPTED LAPAROSCOPIC PARTIAL COLECTOMY;  Surgeon: [name], MD;  Location: [facility];  Service: General;  Laterality: N/A;  . PARTIAL COLECTOMY  05/11/2019   Procedure: PARTIAL COLECTOMY (Laparotomy incision at 1884;  Surgeon: [name], MD;  Location: [facility];  Service: General;;  . POLYPECTOMY  05/09/2019   Procedure: POLYPECTOMY;  Surgeon: Daneil Dolin, MD;  [facility];  Service: Endoscopy;;     reports that he has quit smoking. His smoking use included cigarettes. He has a 30.00 pack-year smoking history. He has never used smokeless tobacco. He reports that he does not drink alcohol and does not use drugs.  Allergies  Allergen Reactions  . Hydrocodone   . Hydrocodone-Acetaminophen     unknown  . Aplisol [Tuberculin Ppd] Rash    Family History  Problem Relation Age of Onset  . COPD Mother   . Cancer Brother   . Anesthesia problems Neg Hx   . Hypotension Neg Hx   . Malignant hyperthermia Neg Hx   . Pseudochol deficiency Neg Hx   . Colon cancer Neg Hx       Prior to Admission medications   Medication Sig Start Date End Date Taking? Authorizing Provider  acetaminophen (TYLENOL) 650 MG CR tablet Take 1 tablet (650 mg total) by  mouth every 8 (eight) hours as needed for pain or fever. 09/11/20  Yes Hongalgi, Lenis Dickinson, MD  allopurinol (ZYLOPRIM) 100 MG tablet Take 100 mg by mouth daily.   Yes [provider]  atenolol (TENORMIN) 25 MG tablet Take 25 mg by mouth daily.    Yes [provider]  atorvastatin (LIPITOR) 10 MG tablet Take 5 mg by mouth at bedtime.    Yes [provider]  cephALEXin (KEFLEX) 500 MG capsule Take 1 capsule (500 mg total) by mouth 2 (two) times daily. 03/02/21  Yes Ripley Fraise, MD  cholecalciferol (VITAMIN D3) 25 MCG (1000 UNIT) tablet Take 1,000 Units by mouth daily.   Yes [provider]  clobetasol cream (TEMOVATE) 1.66 % Apply 1 application topically 2 (two) times daily.   Yes [provider]  Colloidal Oatmeal (EUCERIN ECZEMA RELIEF) 1 % CREA Apply 1 application topically 2 (two) times daily. Apply to face and neck   Yes [provider]  cycloSPORINE (RESTASIS) 0.05 % ophthalmic emulsion Place 1 drop into both eyes 2 (two) times daily.   Yes [provider]  feeding supplement (ENSURE ENLIVE /  ENSURE PLUS) LIQD Take 237 mLs by mouth 2 (two) times daily between meals. 09/11/20  Yes Hongalgi, Lenis Dickinson, MD  ferrous sulfate 325 (65 FE) MG EC tablet Take 325 mg by mouth daily at 12 noon.   Yes [provider]  furosemide (LASIX) 40 MG tablet Take 40 mg by mouth daily.   Yes [provider]  ketoconazole (NIZORAL) 2 % shampoo Apply 1 application topically every 12 (twelve) hours as needed for irritation. Apply to Scalp   Yes [provider]  metFORMIN (GLUCOPHAGE) 500 MG tablet Take 500 mg by mouth at bedtime.   Yes [provider]  ondansetron (ZOFRAN) 4 MG tablet Take 4 mg by mouth every 6 (six) hours as needed for nausea or vomiting.   Yes [provider]  potassium chloride (KLOR-CON) 20 MEQ packet Take 20 mEq by mouth 2 (two) times daily.    Yes [provider]  vitamin B-12  (CYANOCOBALAMIN) 1000 MCG tablet Take 1,000 mcg by mouth daily.   Yes [provider]    Physical Exam: Vitals:   03/03/21 1330 03/03/21 1400 03/03/21 1530 03/03/21 1608  BP: 118/67 110/64 118/63 130/77  Pulse: 96 88 87 91  Resp: (!) 30 (!) 21 (!) 24 14  Temp:    98.7 F (37.1 C)  TempSrc:    Rectal  SpO2: 93% 94% 93% 93%  Weight:      Height:         Vitals:   03/03/21 1330 03/03/21 1400 03/03/21 1530 03/03/21 1608  BP: 118/67 110/64 118/63 130/77  Pulse: 96 88 87 91  Resp: (!) 30 (!) 21 (!) 24 14  Temp:    98.7 F (37.1 C)  TempSrc:    Rectal  SpO2: 93% 94% 93% 93%  Weight:      Height:          Constitutional: Alert and oriented x only to person . Not in any apparent distress HEENT:      Head: Normocephalic and atraumatic.         Eyes: PERLA, EOMI, Conjunctivae are normal. Sclera is non-icteric.       Mouth/Throat: Mucous membranes are dry      Neck: Supple with no signs of meningismus. Cardiovascular:  Tachycardia, no murmurs, gallops, or rubs. 2+ symmetrical distal pulses are present . No JVD. No LE edema Respiratory:  Tachypneic.crackles at the lung bases.  Scattered rhonchi, no wheezes Gastrointestinal: Soft, non tender, and non distended with hypoactive bowel sounds.  Genitourinary: No CVA tenderness. Musculoskeletal: Nontender with normal range of motion in all extremities. No cyanosis, or erythema of extremities. Neurologic:  Face is symmetric. Moving all extremities. No gross focal neurologic deficits  Skin: Skin is warm, dry.  No rash or ulcers Psychiatric: Mood and affect are normal   Labs on Admission: I have personally reviewed following labs and imaging studies  CBC: Recent Labs  Lab 03/01/21 2238 03/03/21 1214  WBC 9.4 13.3*  NEUTROABS  --  10.7*  HGB 13.4 15.2  HCT 41.1 47.5  MCV 92.8 93.3  PLT 190 409   Basic Metabolic Panel: Recent Labs  Lab 03/01/21 2238 03/03/21 1214  NA 138 144  K 4.9 4.1  CL 100 107  CO2 26 22   GLUCOSE 135* 164*  BUN 50* 67*  CREATININE 1.41* 1.26*  CALCIUM 9.0 9.4   GFR: Estimated Creatinine Clearance: 62.5 mL/min (A) (by C-G formula based on SCr of 1.26 mg/dL (H)). Liver Function Tests: Recent  Labs  Lab 03/01/21 2238 03/03/21 1214  AST 23 17  ALT 18 17  ALKPHOS 84 77  BILITOT 1.5* 1.3*  PROT 7.2 7.5  ALBUMIN 3.4* 3.4*   Recent Labs  Lab 03/01/21 2238  LIPASE 24   No results for input(s): AMMONIA in the last 168 hours. Coagulation Profile: Recent Labs  Lab 03/03/21 1239  INR 1.2   Cardiac Enzymes: No results for input(s): CKTOTAL, CKMB, CKMBINDEX, TROPONINI in the last 168 hours. BNP (last 3 results) No results for input(s): PROBNP in the last 8760 hours. HbA1C: No results for input(s): HGBA1C in the last 72 hours. CBG: Recent Labs  Lab 03/03/21 1141 03/03/21 1659  GLUCAP 159* 131*   Lipid Profile: No results for input(s): CHOL, HDL, LDLCALC, TRIG, CHOLHDL, LDLDIRECT in the last 72 hours. Thyroid Function Tests: No results for input(s): TSH, T4TOTAL, FREET4, T3FREE, THYROIDAB in the last 72 hours. Anemia Panel: No results for input(s): VITAMINB12, FOLATE, FERRITIN, TIBC, IRON, RETICCTPCT in the last 72 hours. Urine analysis:    Component Value Date/Time   COLORURINE YELLOW 03/03/2021 1157   APPEARANCEUR CLOUDY (A) 03/03/2021 1157   LABSPEC 1.019 03/03/2021 1157   PHURINE 5.0 03/03/2021 1157   GLUCOSEU NEGATIVE 03/03/2021 1157   HGBUR MODERATE (A) 03/03/2021 1157   BILIRUBINUR NEGATIVE 03/03/2021 1157   KETONESUR 5 (A) 03/03/2021 1157   PROTEINUR NEGATIVE 03/03/2021 1157   UROBILINOGEN 0.2 05/27/2010 2038   NITRITE NEGATIVE 03/03/2021 1157   LEUKOCYTESUR LARGE (A) 03/03/2021 1157    Radiological Exams on Admission: DG Chest Port 1 View  Result Date: 03/03/2021 CLINICAL DATA:  Lethargy. EXAM: PORTABLE CHEST 1 VIEW COMPARISON:  Chest x-ray 09/06/2020. FINDINGS: Cardiomegaly. No pulmonary venous congestion. Low lung volumes with bibasilar  atelectasis/infiltrates. No pleural effusion or pneumothorax. Gastric distention noted. Opacity noted over the right upper chest may be outside the patient. Diffuse osteopenia. Degenerative change thoracic spine and both shoulders. IMPRESSION: 1.  Low lung volumes with bibasilar atelectasis/infiltrates. 2.  Gastric distention. Electronically Signed   By: Marcello Moores  Register   On: 03/03/2021 12:55   DG Chest Port 1V same Day  Result Date: 03/03/2021 CLINICAL DATA:  NG tube placement. EXAM: PORTABLE CHEST 1 VIEW COMPARISON:  03/03/2021. FINDINGS: NG tube noted with tip and side hole over the stomach. Prominent gastric distention noted. Cardiomegaly. No pulmonary venous congestion. Low lung volumes with bibasilar atelectasis. No pleural effusion or pneumothorax. Degenerative changes thoracolumbar spine. IMPRESSION: 1. NG tube noted with tip and side hole over the stomach. Prominent gastric distention noted. 2.  Cardiomegaly.  No pulmonary venous congestion. 3.  Low lung volumes with bibasilar atelectasis. Electronically Signed   By: Marcello Moores  Register   On: 03/03/2021 17:00   DG ABD ACUTE 2+V W 1V CHEST  Result Date: 03/01/2021 CLINICAL DATA:  Abdominal pain EXAM: DG ABDOMEN ACUTE WITH 1 VIEW CHEST COMPARISON:  None. FINDINGS: Nonspecific focal opacity in the right mid lung. No focal airspace consolidation. No pleural effusion or pneumothorax. Stool ball noted in the rectum. There is prominent small bowel in the right lower quadrant. IMPRESSION: 1. Nonspecific focal opacity in the right mid lung. This may indicate scarring/atelectasis or a pulmonary nodule. Follow-up chest radiograph or chest CT is recommended. 2. Stool ball in the rectum with prominent small bowel in the right lower quadrant. Electronically Signed   By: Ulyses Jarred M.D.   On: 03/01/2021 23:49   CT Renal Stone Study  Result Date: 03/03/2021 CLINICAL DATA:  Flank pain. EXAM: CT ABDOMEN AND  PELVIS WITHOUT CONTRAST TECHNIQUE: Multidetector CT  imaging of the abdomen and pelvis was performed following the standard protocol without IV contrast. COMPARISON:  September 07, 2020. FINDINGS: Lower chest: Mild bilateral posterior basilar atelectasis or infiltrates are noted. Hepatobiliary: No focal liver abnormality is seen. No gallstones, gallbladder wall thickening, or biliary dilatation. Pancreas: Unremarkable. No pancreatic ductal dilatation or surrounding inflammatory changes. Spleen: Normal in size without focal abnormality. Adrenals/Urinary Tract: Adrenal glands appear normal. Bilateral nonobstructive nephrolithiasis is noted. 8 mm nonobstructive calculus is noted in right renal pelvis. No hydronephrosis or renal obstruction is noted. Several bladder diverticula are noted. Stomach/Bowel: Moderate gastric distention is noted. Small bowel dilatation is noted with probable transition zone in right lower quadrant as there is nondilated small bowel in this area. Status post right partial colectomy. No colonic dilatation is noted. Stool is noted in the rectum. Vascular/Lymphatic: Aortic atherosclerosis. No enlarged abdominal or pelvic lymph nodes. Reproductive: Prostate is unremarkable. Other: No abdominal wall hernia or abnormality. No abdominopelvic ascites. Musculoskeletal: No acute or significant osseous findings. IMPRESSION: Moderate bilateral posterior basilar atelectasis or infiltrates are noted. Moderate gastric distention is noted with proximal small bowel dilatation with possible transition zone seen in right lower quadrant. This is concerning for probable partial small bowel obstruction. Status post partial right colectomy. Bilateral nonobstructive nephrolithiasis. 8 mm nonobstructive calculus seen in right renal pelvis. Aortic Atherosclerosis (ICD10-I70.0). Electronically Signed   By: Marijo Conception M.D.   On: 03/03/2021 13:57     Assessment/Plan Principal Problem:   Sepsis ([place]) Active Problems:   Essential hypertension   Dementia without  behavioral disturbance (HCC)   Lactic acidosis   Aspiration pneumonia of both lower lobes due to gastric secretions (HCC)   SBO (small bowel obstruction) (H[facility])   Acute lower UTI     Sepsis Most likely from aspiration pneumonia and a urinary source As evidenced by fever, tachycardia, tachypnea, leukocytosis, lactic acidosis as well as pyuria and imaging suggestive of bilateral pneumonia. Patient has findings suggestive of a partial small bowel obstruction on imaging and has had multiple episodes of emesis with possible aspiration as well as hypoxia.  Noted to have room air pulse oximetry of 90% on 3 L. We will treat patient empirically with Zosyn Follow-up results of blood and urine culture Continue aggressive IV fluid resuscitation    Partial small bowel obstruction Patient has had prior partial right colectomy  for colon cancer and imaging shows a distended stomach with transition zone in the right lower quadrant. Nasogastric decompression N.p.o. for now As needed antiemetics and IV PPI Surgical consult     Diabetes mellitus Hold metformin Blood sugar checks every 4 hours    Dementia without behavioral disturbance Stable    Lactic acidosis May be secondary to sepsis as well as metformin use No concerns for acute bowel ischemia at this time since patient does not have any abdominal pain and abdomen is soft.    UTI Patient has pyuria Foley catheter was placed in the ER due to urinary retention Follow-up results of urine culture.  DVT prophylaxis: Lovenox Code Status: full code Family Communication: Patient has a guardian from social services, unable to reach at this time Disposition Plan: Back to previous home environment Consults called: Surgery Status: At the time of admission, it appears that the appropriate admission status for this patient is inpatient. This is judged to be reasonable and necessary in order to provide the required intensity of service to  ensure the patient's safety given the presenting  symptoms, physical exam findings and initial radiographic and laboratory data in the context of their comorbid conditions. Patient requires inpatient status due to high intensity of service, high risk of further deterioration and high frequency of surveillance required.    Collier Bullock MD [facility]     03/03/2021, 5:28 PM

## 2021-03-03 NOTE — ED Triage Notes (Signed)
Pt sent by [facility] nursing home. [name] states the patient is more lethargic today, & has Low O2 sats. Pt wears 3L chronically and sats are 90% during triage. Pt noted to be diaphoretic and warm to touch.

## 2021-03-03 NOTE — Progress Notes (Signed)
Notified provider of request to order third LA. Second LA increased.

## 2021-03-03 NOTE — Progress Notes (Signed)
Pharmacy Antibiotic Note  [name] is a 75 y.o. male admitted on 03/03/2021 with unknown source.  Pharmacy has been consulted for Zosyn dosing.   Plan: Zosyn 3.375gm IV q8h (4 hr infusion) F/U cxs and clinical progress Monitor V/S, labs and levels as indicated  Height: 6' (182.9 cm) Weight: 105 kg (231 lb 7.7 oz) IBW/kg (Calculated) : 77.6  Temp (24hrs), Avg:99.7 F (37.6 C), Min:98.7 F (37.1 C), Max:100.7 F (38.2 C)  Recent Labs  Lab 03/01/21 2238 03/03/21 1214 03/03/21 1451  WBC 9.4 13.3*  --   CREATININE 1.41* 1.26*  --   LATICACIDVEN  --  3.3* 3.6*    Estimated Creatinine Clearance: 62.5 mL/min (A) (by C-G formula based on SCr of 1.26 mg/dL (H)).    Allergies  Allergen Reactions  . Hydrocodone   . Hydrocodone-Acetaminophen     unknown  . Aplisol [Tuberculin Ppd] Rash    Antimicrobials this admission: Vancomycin 6/7>>  Cefepime 6/7 >>  Zosyn 6/7 >>  Microbiology results: 6/7 BCx: pending 6/7 UCx: pending  MRSA PCR:   Thank you for allowing pharmacy to be a part of this patient's care.  Hart Robinsons, PharmD Clinical Pharmacist 03/03/2021 5:23 PM

## 2021-03-03 NOTE — Progress Notes (Signed)
Pharmacy Antibiotic Note  [name] is a 75 y.o. male admitted on 03/03/2021 with unknown source.  Pharmacy has been consulted for Vancomycin and cefepime dosing.   Plan: Vancomycin 2gm IV loading dose, then 1000mg  IV q12h for AUC 520 Cefepime 2gm IV q8h F/U cxs and clinical progress Monitor V/S, labs and levels as indicated  Height: 6' (182.9 cm) Weight: 105 kg (231 lb 7.7 oz) IBW/kg (Calculated) : 77.6  Temp (24hrs), Avg:100.7 F (38.2 C), Min:100.7 F (38.2 C), Max:100.7 F (38.2 C)  Recent Labs  Lab 03/01/21 2238 03/03/21 1214  WBC 9.4 13.3*  CREATININE 1.41* 1.26*  LATICACIDVEN  --  3.3*    Estimated Creatinine Clearance: 62.5 mL/min (A) (by C-G formula based on SCr of 1.26 mg/dL (H)).    Allergies  Allergen Reactions  . Hydrocodone   . Hydrocodone-Acetaminophen     unknown  . Aplisol [Tuberculin Ppd] Rash    Antimicrobials this admission: Vancomycin 6/7>>  Cefepime 6/7 >>   Microbiology results: 6/7 BCx: pending 6/7 UCx: pending  MRSA PCR:   Thank you for allowing pharmacy to be a part of this patient's care.  Isac Sarna, BS Pharm D, BCPS Clinical Pharmacist Pager [phone] 03/03/2021 2:20 PM

## 2021-03-04 DIAGNOSIS — K56609 Unspecified intestinal obstruction, unspecified as to partial versus complete obstruction: Secondary | ICD-10-CM

## 2021-03-04 DIAGNOSIS — I10 Essential (primary) hypertension: Secondary | ICD-10-CM

## 2021-03-04 DIAGNOSIS — N390 Urinary tract infection, site not specified: Secondary | ICD-10-CM

## 2021-03-04 LAB — GLUCOSE, CAPILLARY
Glucose-Capillary: 103 mg/dL — ABNORMAL HIGH (ref 70–99)
Glucose-Capillary: 113 mg/dL — ABNORMAL HIGH (ref 70–99)
Glucose-Capillary: 115 mg/dL — ABNORMAL HIGH (ref 70–99)
Glucose-Capillary: 76 mg/dL (ref 70–99)
Glucose-Capillary: 85 mg/dL (ref 70–99)
Glucose-Capillary: 92 mg/dL (ref 70–99)
Glucose-Capillary: 99 mg/dL (ref 70–99)

## 2021-03-04 LAB — CBC
HCT: 38.8 % — ABNORMAL LOW (ref 39.0–52.0)
Hemoglobin: 12.2 g/dL — ABNORMAL LOW (ref 13.0–17.0)
MCH: 30.2 pg (ref 26.0–34.0)
MCHC: 31.4 g/dL (ref 30.0–36.0)
MCV: 96 fL (ref 80.0–100.0)
Platelets: 180 10*3/uL (ref 150–400)
RBC: 4.04 MIL/uL — ABNORMAL LOW (ref 4.22–5.81)
RDW: 14.2 % (ref 11.5–15.5)
WBC: 9.4 10*3/uL (ref 4.0–10.5)
nRBC: 0 % (ref 0.0–0.2)

## 2021-03-04 LAB — BASIC METABOLIC PANEL
Anion gap: 10 (ref 5–15)
BUN: 56 mg/dL — ABNORMAL HIGH (ref 8–23)
CO2: 22 mmol/L (ref 22–32)
Calcium: 8.8 mg/dL — ABNORMAL LOW (ref 8.9–10.3)
Chloride: 113 mmol/L — ABNORMAL HIGH (ref 98–111)
Creatinine, Ser: 0.92 mg/dL (ref 0.61–1.24)
GFR, Estimated: >60 mL/min (ref >60)
Glucose, Bld: 102 mg/dL — ABNORMAL HIGH (ref 70–99)
Potassium: 3.3 mmol/L — ABNORMAL LOW (ref 3.5–5.1)
Sodium: 145 mmol/L (ref 135–145)

## 2021-03-04 LAB — PROTIME-INR
INR: 1.4 — ABNORMAL HIGH (ref 0.8–1.2)
Prothrombin Time: 16.8 seconds — ABNORMAL HIGH (ref 11.4–15.2)

## 2021-03-04 LAB — PROCALCITONIN: Procalcitonin: 0.28 ng/mL

## 2021-03-04 LAB — CORTISOL-AM, BLOOD: Cortisol - AM: 38.7 ug/dL — ABNORMAL HIGH (ref 6.7–22.6)

## 2021-03-04 MED ORDER — CHLORHEXIDINE GLUCONATE CLOTH 2 % EX PADS
6.0000 | MEDICATED_PAD | Freq: Every day | CUTANEOUS | Status: DC
  Administered 2021-03-04 – 2021-03-09 (×6): 6 via TOPICAL

## 2021-03-04 MED ORDER — POTASSIUM CHLORIDE 10 MEQ/100ML IV SOLN
10.0000 meq | INTRAVENOUS | Status: AC
  Administered 2021-03-04 (×4): 10 meq via INTRAVENOUS
  Filled 2021-03-04 (×4): qty 100

## 2021-03-04 MED ORDER — POTASSIUM CHLORIDE CRYS ER 20 MEQ PO TBCR: 60.0000 meq | EXTENDED_RELEASE_TABLET | Freq: Once | ORAL | Status: DC

## 2021-03-04 MED ORDER — BISACODYL 10 MG RE SUPP
10.0000 mg | Freq: Two times a day (BID) | RECTAL | Status: DC
  Administered 2021-03-04 – 2021-03-07 (×7): 10 mg via RECTAL
  Filled 2021-03-04 (×11): qty 1

## 2021-03-04 NOTE — Progress Notes (Addendum)
PROGRESS NOTE   [name]  WIO:[id number] DOB: [birth year] DOA: 03/03/2021 PCP: Caprice Renshaw, MD   Chief Complaint  Patient presents with  . Fatigue   Level of care: Telemetry  Brief Admission History:  75 year old gentleman long-term resident at [facility] presented to the emergency department with hypoxia.  He chronically wears 3 L of oxygen.  He was noted to have sepsis UTI and bilateral lower lobe infiltrates and started on treatment for aspiration pneumonia and pyuria.  He also was noted to have acute urinary retention and had a Foley catheter placed.  In addition he was noted to have a probable partial small bowel obstruction and surgery was consulted.     Assessment & Plan:   Principal Problem:   Sepsis ([place]) Active Problems:   Essential hypertension   Dementia without behavioral disturbance (HCC)   Lactic acidosis   Aspiration pneumonia of both lower lobes due to gastric secretions (HCC)   SBO (small bowel obstruction) (HCC)   Acute lower UTI   Sepsis - secondary to UTI and aspiration pneumonia -his sepsis physiology has resolved with aggressive treatments continue IV antibiotics and current orders.  Lactic acid has normalized.  Partial small bowel obstruction-appreciate surgery consultation continue NG tube and supportive measures.  Possibly will need a small bowel follow-through study.  He remains a very poor candidate for surgical intervention.  Dulcolax suppository started by surgery.  Follow-up CEA level ordered by surgery.  Continue NG suction.    Lactic acidosis -resolved.  UTI-currently being treated with IV antibiotics pending culture results.  Acute urinary retention-treated with Foley placement we will discontinue Foley today and do a voiding trial.  Type 2 diabetes mellitus-continue SSI coverage and CBG monitoring.  Dementia-stable on home medications  Hypokalemia - oral replacement ordered, check Magnesium. BMP in AM.   DVT prophylaxis: Enoxaparin Code  Status: Full Family Communication: None present Disposition: Return to SNF when medically stable Status is: Inpatient  Remains inpatient appropriate because:IV treatments appropriate due to intensity of illness or inability to take PO and Inpatient level of care appropriate due to severity of illness   Dispo: The patient is from: SNF              Anticipated d/c is to: SNF              Patient currently is not medically stable to d/c.   Difficult to place patient No   Consultants:   Surgery   Procedures:     Antimicrobials:  Zosyn 6/7>>   Subjective: Pt difficult to understand but denies any acute complaints at this time.    Objective: Vitals:   03/03/21 1530 03/03/21 1608 03/03/21 1658 03/03/21 2313  BP: 118/63 130/77  117/65  Pulse: 87 91  80  Resp: (!) 24 14 20 20   Temp:  98.7 F (37.1 C) 98.4 F (36.9 C) 98.4 F (36.9 C)  TempSrc:  Rectal  Oral  SpO2: 93% 93%  97%  Weight:      Height:        Intake/Output Summary (Last 24 hours) at 03/04/2021 1357 Last data filed at 03/04/2021 1208 Gross per 24 hour  Intake 2496.13 ml  Output 2200 ml  Net 296.13 ml   Filed Weights   03/03/21 1150  Weight: 105 kg    Examination:  General exam: somnolent but arousable, Appears calm and comfortable. NG in place.  Respiratory system: no increased work of breathing seen. Cardiovascular system: normal S1 & S2 heard. No  JVD, murmurs, rubs, gallops or clicks. No pedal edema. Gastrointestinal system: Abdomen is nondistended, soft and nontender. No organomegaly or masses felt. Normal bowel sounds minimal. Central nervous system: Alert and oriented to person. No focal neurological deficits. Extremities: Symmetric 5 x 5 power. Skin: No rashes, lesions or ulcers Psychiatry: Judgement and insight appear poor. Mood & affect appropriate.   Data Reviewed: I have personally reviewed following labs and imaging studies  CBC: Recent Labs  Lab 03/01/21 2238 03/03/21 1214  03/04/21 0419  WBC 9.4 13.3* 9.4  NEUTROABS  --  10.7*  --   HGB 13.4 15.2 12.2*  HCT 41.1 47.5 38.8*  MCV 92.8 93.3 96.0  PLT 190 271 161    Basic Metabolic Panel: Recent Labs  Lab 03/01/21 2238 03/03/21 1214 03/04/21 0419  NA 138 144 145  K 4.9 4.1 3.3*  CL 100 107 113*  CO2 26 22 22   GLUCOSE 135* 164* 102*  BUN 50* 67* 56*  CREATININE 1.41* 1.26* 0.92  CALCIUM 9.0 9.4 8.8*    GFR: Estimated Creatinine Clearance: 85.6 mL/min (by C-G formula based on SCr of 0.92 mg/dL).  Liver Function Tests: Recent Labs  Lab 03/01/21 2238 03/03/21 1214  AST 23 17  ALT 18 17  ALKPHOS 84 77  BILITOT 1.5* 1.3*  PROT 7.2 7.5  ALBUMIN 3.4* 3.4*    CBG: Recent Labs  Lab 03/03/21 2022 03/04/21 0104 03/04/21 0435 03/04/21 0724 03/04/21 1101  GLUCAP 126* 113* 115* 103* 99    Recent Results (from the past 240 hour(s))  Urine culture     Status: Abnormal (Preliminary result)   Collection Time: 03/02/21  1:28 AM   Specimen: Urine, Catheterized  Result Value Ref Range Status   Specimen Description   Final    URINE, CATHETERIZED Performed at [facility], [address], [place], [place] [address]    Special Requests   Final    NONE Performed at [facility], [address] [place], [place] [address]    Culture (A)  Final    >=100,000 COLONIES/mL KLEBSIELLA PNEUMONIAE CULTURE REINCUBATED FOR BETTER GROWTH SUSCEPTIBILITIES TO FOLLOW Performed at [facility], [place] [address] [place], [place] [address]    Report Status PENDING  Incomplete  Culture, blood (Routine x 2)     Status: None (Preliminary result)   Collection Time: 03/03/21 12:14 PM   Specimen: BLOOD RIGHT ARM  Result Value Ref Range Status   Specimen Description BLOOD RIGHT ARM  Final   Special Requests   Final    BOTTLES DRAWN AEROBIC AND ANAEROBIC Blood Culture results may not be optimal due to an inadequate volume of blood received in culture bottles   Culture   Final    NO GROWTH < 24  HOURS Performed at [facility], [address] [place], [place] [address]    Report Status PENDING  Incomplete  Culture, blood (Routine x 2)     Status: None (Preliminary result)   Collection Time: 03/03/21 12:14 PM   Specimen: BLOOD  Result Value Ref Range Status   Specimen Description BLOOD LEFT ANTECUBITAL  Final   Special Requests   Final    BOTTLES DRAWN AEROBIC AND ANAEROBIC Blood Culture results may not be optimal due to an inadequate volume of blood received in culture bottles   Culture   Final    NO GROWTH < 24 HOURS Performed at [facility], [address], [place],  [address]    Report Status PENDING  Incomplete  Resp Panel by RT-PCR (Flu A&B, Covid) Nasopharyngeal Swab     Status: None   Collection Time: 03/03/21 12:53 PM   Specimen: Nasopharyngeal Swab; Nasopharyngeal(NP) swabs in vial transport medium  Result Value Ref Range Status   SARS Coronavirus 2 by RT PCR NEGATIVE NEGATIVE Final    Comment: (NOTE) SARS-CoV-2 target nucleic acids are NOT DETECTED.  The SARS-CoV-2 RNA is generally detectable in upper respiratory specimens during the acute phase of infection. The lowest concentration of SARS-CoV-2 viral copies this assay can detect is 138 copies/mL. A negative result does not preclude SARS-Cov-2 infection and should not be used as the sole basis for treatment or other patient management decisions. A negative result may occur with  improper specimen collection/handling, submission of specimen other than nasopharyngeal swab, presence of viral mutation(s) within the areas targeted by this assay, and inadequate number of viral copies(<138 copies/mL). A negative result must be combined with clinical observations, patient history, and epidemiological information. The expected result is Negative.  Fact Sheet for Patients:  EntrepreneurPulse.com.au  Fact Sheet for Healthcare Providers:  IncredibleEmployment.be  This test  is no t yet approved or cleared by the [place] FDA and  has been authorized for detection and/or diagnosis of SARS-CoV-2 by FDA under an Emergency Use Authorization (EUA). This EUA will remain  in effect (meaning this test can be used) for the duration of the COVID-19 declaration under Section 564(b)(1) of the Act, 21 U.S.C.section 360bbb-3(b)(1), unless the authorization is terminated  or revoked sooner.       Influenza A by PCR NEGATIVE NEGATIVE Final   Influenza B by PCR NEGATIVE NEGATIVE Final    Comment: (NOTE) The Xpert Xpress SARS-CoV-2/FLU/RSV plus assay is intended as an aid in the diagnosis of influenza from Nasopharyngeal swab specimens and should not be used as a sole basis for treatment. Nasal washings and aspirates are unacceptable for Xpert Xpress SARS-CoV-2/FLU/RSV testing.  Fact Sheet for Patients: EntrepreneurPulse.com.au  Fact Sheet for Healthcare Providers: IncredibleEmployment.be  This test is not yet approved or cleared by the [place] FDA and has been authorized for detection and/or diagnosis of SARS-CoV-2 by FDA under an Emergency Use Authorization (EUA). This EUA will remain in effect (meaning this test can be used) for the duration of the COVID-19 declaration under Section 564(b)(1) of the Act, 21 U.S.C. section 360bbb-3(b)(1), unless the authorization is terminated or revoked.  Performed at [facility], [address] [place], [place] [address]   MRSA PCR Screening     Status: None   Collection Time: 03/03/21  6:15 PM   Specimen: Nasopharyngeal  Result Value Ref Range Status   MRSA by PCR NEGATIVE NEGATIVE Final    Comment:        The GeneXpert MRSA Assay (FDA approved for NASAL specimens only), is one component of a comprehensive MRSA colonization surveillance program. It is not intended to diagnose MRSA infection nor to guide or monitor treatment for MRSA infections. Performed at [facility], [address], [place], [place] [address]      Radiology Studies: [facility] Chest [facility] 1 View  Result Date: 03/03/2021 CLINICAL DATA:  Lethargy. EXAM: PORTABLE CHEST 1 VIEW COMPARISON:  Chest x-ray 09/06/2020. FINDINGS: Cardiomegaly. No pulmonary venous congestion. Low lung volumes with bibasilar atelectasis/infiltrates. No pleural effusion or pneumothorax. Gastric distention noted. Opacity noted over the right upper chest may be outside the patient. Diffuse osteopenia. Degenerative change thoracic spine and both shoulders. IMPRESSION: 1.  Low lung volumes with bibasilar atelectasis/infiltrates. 2.  Gastric distention. Electronically Signed   By: Marcello Moores  Register   On: 03/03/2021 12:55   DG Chest Port 1V same Day  Result Date: 03/03/2021 CLINICAL DATA:  NG tube placement. EXAM: PORTABLE CHEST 1 VIEW COMPARISON:  03/03/2021. FINDINGS: NG tube noted with tip and side hole over the stomach. Prominent gastric distention noted. Cardiomegaly. No pulmonary venous congestion. Low lung volumes with bibasilar atelectasis. No pleural effusion or pneumothorax. Degenerative changes thoracolumbar spine. IMPRESSION: 1. NG tube noted with tip and side hole over the stomach. Prominent gastric distention noted. 2.  Cardiomegaly.  No pulmonary venous congestion. 3.  Low lung volumes with bibasilar atelectasis. Electronically Signed   By: Marcello Moores  Register   On: 03/03/2021 17:00   CT Renal Stone Study  Result Date: 03/03/2021 CLINICAL DATA:  Flank pain. EXAM: CT ABDOMEN AND PELVIS WITHOUT CONTRAST TECHNIQUE: Multidetector CT imaging of the abdomen and pelvis was performed following the standard protocol without IV contrast. COMPARISON:  September 07, 2020. FINDINGS: Lower chest: Mild bilateral posterior basilar atelectasis or infiltrates are noted. Hepatobiliary: No focal liver abnormality is seen. No gallstones, gallbladder wall thickening, or biliary dilatation. Pancreas: Unremarkable. No pancreatic ductal dilatation or  surrounding inflammatory changes. Spleen: Normal in size without focal abnormality. Adrenals/Urinary Tract: Adrenal glands appear normal. Bilateral nonobstructive nephrolithiasis is noted. 8 mm nonobstructive calculus is noted in right renal pelvis. No hydronephrosis or renal obstruction is noted. Several bladder diverticula are noted. Stomach/Bowel: Moderate gastric distention is noted. Small bowel dilatation is noted with probable transition zone in right lower quadrant as there is nondilated small bowel in this area. Status post right partial colectomy. No colonic dilatation is noted. Stool is noted in the rectum. Vascular/Lymphatic: Aortic atherosclerosis. No enlarged abdominal or pelvic lymph nodes. Reproductive: Prostate is unremarkable. Other: No abdominal wall hernia or abnormality. No abdominopelvic ascites. Musculoskeletal: No acute or significant osseous findings. IMPRESSION: Moderate bilateral posterior basilar atelectasis or infiltrates are noted. Moderate gastric distention is noted with proximal small bowel dilatation with possible transition zone seen in right lower quadrant. This is concerning for probable partial small bowel obstruction. Status post partial right colectomy. Bilateral nonobstructive nephrolithiasis. 8 mm nonobstructive calculus seen in right renal pelvis. Aortic Atherosclerosis (ICD10-I70.0). Electronically Signed   By: Marijo Conception M.D.   On: 03/03/2021 13:57    Scheduled Meds: . bisacodyl  10 mg Rectal BID  . Chlorhexidine Gluconate Cloth  6 each Topical Daily  . cycloSPORINE  1 drop Both Eyes BID  . enoxaparin (LOVENOX) injection  40 mg Subcutaneous Q24H  . pantoprazole (PROTONIX) IV  40 mg Intravenous Q24H  . potassium chloride  60 mEq Oral Once   Continuous Infusions: . lactated ringers 150 mL/hr at 03/03/21 1539  . lactated ringers 125 mL/hr at 03/03/21 2244  . piperacillin-tazobactam (ZOSYN)  IV 3.375 g (03/04/21 0826)     LOS: 1 day   Time spent: 74  mins  Saralyn Willison Wynetta Emery, MD How to contact the [facility] Attending or Consulting provider [place] or covering provider during after hours [place], for this patient?  1. Check the care team in [facility] and look for a) attending/consulting [facility] provider listed and b) the [facility] team listed 2. Log into [email or web address] and use [place]'s universal password to access. If you do not have the password, please contact the hospital operator. 3. Locate the [facility] provider you are looking for under [facility] and page to a number that you can be directly reached. 4. If you still have  difficulty reaching the provider, please page the [facility] (Director on Call) for the Hospitalists listed on amion for assistance.  03/04/2021, 1:57 PM

## 2021-03-04 NOTE — Consult Note (Addendum)
Reason for Consult: Small bowel obstruction Referring Physician: Dr. Prescilla Sours is an 75 y.o. male.  HPI: Patient is a 75 year old white male with multiple medical problems who presented from the nursing home with hypoxia and fever.  A CT scan of the abdomen revealed a partial small bowel obstruction.  He is also admitted for treatment of sepsis.  It is felt that this is secondary to bilateral pneumonia.  Patient is only able to give limited answers due to his dementia.  Patient is status post a right hemicolectomy in 2020 for a stage III colon carcinoma.  He has not received adjuvant therapy.  Past Medical History:  Diagnosis Date  . Bell's palsy   . Bell's palsy   . Bleeding ulcer    remote past  . 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 Dementia    disoriented to time.does not know medical hx.information obtained from previous charts  . Diabetes mellitus without complication ([place])   . GERD (gastroesophageal reflux disease)   . Hard of hearing   . Hyperlipemia   . Hypertension   . Iron deficiency anemia   . Obesity   . SOB (shortness of breath) on exertion     Past Surgical History:  Procedure Laterality Date  . BIOPSY  05/09/2019   Procedure: BIOPSY;  Surgeon: Daneil Dolin, MD;  [facility];  Service: Endoscopy;;  . COLONOSCOPY  10/06/06   internal hemorrhoids and anal papilla/poor prep  . 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 CYSTOSCOPY W/ URETERAL STENT PLACEMENT Right 09/08/2020   Procedure: CYSTOSCOPY WITH RETROGRADE PYELOGRAM/URETERAL STENT PLACEMENT;  Surgeon: Irine Seal, MD;  Location: [facility];  Service: Urology;  Laterality: Right;  . CYSTOSCOPY/URETEROSCOPY/HOLMIUM LASER/STENT PLACEMENT Right 09/30/2020   Procedure: CYSTOSCOPY RIGHT URETEROSCOPY/HOLMIUM LASER/STENT PLACEMENT;  Surgeon: Irine Seal, MD;  Location[facility];  Service: Urology;  Laterality: Right;  . ESOPHAGOGASTRODUODENOSCOPY N/A 05/01/2018   multiple 3-6 mm  gastric nodules and gastritis, surrounding telangectasias. Biopsy with atrophic gastritis.   Marland Kitchen FOOT SURGERY    . KNEE ARTHROSCOPY  06/17/2011   Procedure: ARTHROSCOPY KNEE;  Surgeon: Sanjuana Kava;  Location[facility];  Service: Orthopedics;  Laterality: Right;  . LAPAROSCOPIC PARTIAL COLECTOMY N/A 05/11/2019   Procedure: ATTEMPTED LAPAROSCOPIC PARTIAL COLECTOMY;  Surgeon: [name], MD;  Location: [facility];  Service: General;  Laterality: N/A;  . PARTIAL COLECTOMY  05/11/2019   Procedure: PARTIAL COLECTOMY (Laparotomy incision at 3903;  Surgeon: [name], MD;  Location: [facility];  Service: General;;  . POLYPECTOMY  05/09/2019   Procedure: POLYPECTOMY;  Surgeon: Daneil Dolin, MD;  [facility];  Service: Endoscopy;;    Family History  Problem Relation Age of Onset  . COPD Mother   . Cancer Brother   . Anesthesia problems Neg Hx   . Hypotension Neg Hx   . Malignant hyperthermia Neg Hx   . Pseudochol deficiency Neg Hx   . Colon cancer Neg Hx     Social History:  reports that he has quit smoking. His smoking use included cigarettes. He has a 30.00 pack-year smoking history. He has never used smokeless tobacco. He reports that he does not drink alcohol and does not use drugs.  Allergies:  Allergies  Allergen Reactions  . Hydrocodone   . Hydrocodone-Acetaminophen     unknown  . Aplisol [Tuberculin Ppd] Rash    Medications: I have reviewed the patient's current medications.  Results for orders placed or performed during the hospital encounter of 03/03/21 (from the past 48 hour(s))  CBG monitoring, ED     Status: Abnormal   Collection Time: 03/03/21 11:41 AM  Result Value Ref Range   Glucose-Capillary 159 (H) 70 - 99 mg/dL    Comment: Glucose reference range applies only to samples taken after fasting for at least 8 hours.  Urinalysis, Routine w reflex microscopic Urine, Catheterized     Status: Abnormal   Collection Time: 03/03/21 11:57 AM  Result Value  Ref Range   Color, Urine YELLOW YELLOW   APPearance CLOUDY (A) CLEAR   Specific Gravity, Urine 1.019 1.005 - 1.030   pH 5.0 5.0 - 8.0   Glucose, UA NEGATIVE NEGATIVE mg/dL   Hgb urine dipstick MODERATE (A) NEGATIVE   Bilirubin Urine NEGATIVE NEGATIVE   Ketones, ur 5 (A) NEGATIVE mg/dL   Protein, ur NEGATIVE NEGATIVE mg/dL   Nitrite NEGATIVE NEGATIVE   Leukocytes,Ua LARGE (A) NEGATIVE   RBC / HPF 21-50 0 - 5 RBC/hpf   WBC, UA >50 (H) 0 - 5 WBC/hpf   Bacteria, UA FEW (A) NONE SEEN   WBC Clumps PRESENT    Mucus PRESENT    Hyaline Casts, UA PRESENT     Comment: Performed at [facility], [address], [place], [place] [address]  Comprehensive metabolic panel     Status: Abnormal   Collection Time: 03/03/21 12:14 PM  Result Value Ref Range   Sodium 144 135 - 145 mmol/L   Potassium 4.1 3.5 - 5.1 mmol/L   Chloride 107 98 - 111 mmol/L   CO2 22 22 - 32 mmol/L   Glucose, Bld 164 (H) 70 - 99 mg/dL    Comment: Glucose reference range applies only to samples taken after fasting for at least 8 hours.   BUN 67 (H) 8 - 23 mg/dL   Creatinine, Ser 1.26 (H) 0.61 - 1.24 mg/dL   Calcium 9.4 8.9 - 10.3 mg/dL   Total Protein 7.5 6.5 - 8.1 g/dL   Albumin 3.4 (L) 3.5 - 5.0 g/dL   AST 17 15 - 41 U/L   ALT 17 0 - 44 U/L   Alkaline Phosphatase 77 38 - 126 U/L   Total Bilirubin 1.3 (H) 0.3 - 1.2 mg/dL   GFR, Estimated 59 (L) >60 mL/min    Comment: (NOTE) Calculated using the CKD-EPI Creatinine Equation (2021)    Anion gap 15 5 - 15    Comment: Performed at [facility], [address] [place], [place] [address]  Lactic acid, plasma     Status: Abnormal   Collection Time: 03/03/21 12:14 PM  Result Value Ref Range   Lactic Acid, Venous 3.3 (HH) 0.5 - 1.9 mmol/L    Comment: CRITICAL RESULT CALLED TO, READ BACK BY AND VERIFIED WITH: E GANT  03/03/21 @ 1259 BY S BEARD Performed at [facility], [address] [place], [place] [address]   CBC with Differential     Status: Abnormal   Collection Time:  03/03/21 12:14 PM  Result Value Ref Range   WBC 13.3 (H) 4.0 - 10.5 K/uL   RBC 5.09 4.22 - 5.81 MIL/uL   Hemoglobin 15.2 13.0 - 17.0 g/dL   HCT 47.5 39.0 - 52.0 %   MCV 93.3 80.0 - 100.0 fL   MCH 29.9 26.0 - 34.0 pg   MCHC 32.0 30.0 - 36.0 g/dL   RDW 14.0 11.5 - 15.5 %   Platelets 271 150 - 400  K/uL   nRBC 0.0 0.0 - 0.2 %   Neutrophils Relative % 80 %   Neutro Abs 10.7 (H) 1.7 - 7.7 K/uL   Lymphocytes Relative 9 %   Lymphs Abs 1.2 0.7 - 4.0 K/uL   Monocytes Relative 9 %   Monocytes Absolute 1.2 (H) 0.1 - 1.0 K/uL   Eosinophils Relative 0 %   Eosinophils Absolute 0.0 0.0 - 0.5 K/uL   Basophils Relative 1 %   Basophils Absolute 0.1 0.0 - 0.1 K/uL   Immature Granulocytes 1 %   Abs Immature Granulocytes 0.13 (H) 0.00 - 0.07 K/uL    Comment: Performed at [facility], [address] [place], [place] [address]  Culture, blood (Routine x 2)     Status: None (Preliminary result)   Collection Time: 03/03/21 12:14 PM   Specimen: BLOOD RIGHT ARM  Result Value Ref Range   Specimen Description BLOOD RIGHT ARM    Special Requests      BOTTLES DRAWN AEROBIC AND ANAEROBIC Blood Culture results may not be optimal due to an inadequate volume of blood received in culture bottles   Culture      NO GROWTH < 24 HOURS Performed at [facility], [address] [place], [place] [address]    Report Status PENDING   Culture, blood (Routine x 2)     Status: None (Preliminary result)   Collection Time: 03/03/21 12:14 PM   Specimen: BLOOD  Result Value Ref Range   Specimen Description BLOOD LEFT ANTECUBITAL    Special Requests      BOTTLES DRAWN AEROBIC AND ANAEROBIC Blood Culture results may not be optimal due to an inadequate volume of blood received in culture bottles   Culture      NO GROWTH < 24 HOURS Performed at [facility], [address] [place], [place] [address]    Report Status PENDING   Protime-INR     Status: Abnormal   Collection Time: 03/03/21 12:39 PM  Result Value Ref Range    Prothrombin Time 15.3 (H) 11.4 - 15.2 seconds   INR 1.2 0.8 - 1.2    Comment: (NOTE) INR goal varies based on device and disease states. Performed at [facility], [address], [place],  [address]   Resp Panel by RT-PCR (Flu A&B, Covid) Nasopharyngeal Swab     Status: None   Collection Time: 03/03/21 12:53 PM   Specimen: Nasopharyngeal Swab; Nasopharyngeal(NP) swabs in vial transport medium  Result Value Ref Range   SARS Coronavirus 2 by RT PCR NEGATIVE NEGATIVE    Comment: (NOTE) SARS-CoV-2 target nucleic acids are NOT DETECTED.  The SARS-CoV-2 RNA is generally detectable in upper respiratory specimens during the acute phase of infection. The lowest concentration of SARS-CoV-2 viral copies this assay can detect is 138 copies/mL. A negative result does not preclude SARS-Cov-2 infection and should not be used as the sole basis for treatment or other patient management decisions. A negative result may occur with  improper specimen collection/handling, submission of specimen other than nasopharyngeal swab, presence of viral mutation(s) within the areas targeted by this assay, and inadequate number of viral copies(<138 copies/mL). A negative result must be combined with clinical observations, patient history, and epidemiological information. The expected result is Negative.  Fact Sheet for Patients:  EntrepreneurPulse.com.au  Fact Sheet for Healthcare Providers:  IncredibleEmployment.be  This test is no t yet approved or cleared by the [place] FDA and  has been authorized for detection and/or diagnosis of SARS-CoV-2 by FDA  under an Emergency Use Authorization (EUA). This EUA will remain  in effect (meaning this test can be used) for the duration of the COVID-19 declaration under Section 564(b)(1) of the Act, 21 U.S.C.section 360bbb-3(b)(1), unless the authorization is terminated  or revoked sooner.       Influenza A by PCR  NEGATIVE NEGATIVE   Influenza B by PCR NEGATIVE NEGATIVE    Comment: (NOTE) The Xpert Xpress SARS-CoV-2/FLU/RSV plus assay is intended as an aid in the diagnosis of influenza from Nasopharyngeal swab specimens and should not be used as a sole basis for treatment. Nasal washings and aspirates are unacceptable for Xpert Xpress SARS-CoV-2/FLU/RSV testing.  Fact Sheet for Patients: EntrepreneurPulse.com.au  Fact Sheet for Healthcare Providers: IncredibleEmployment.be  This test is not yet approved or cleared by the [place] FDA and has been authorized for detection and/or diagnosis of SARS-CoV-2 by FDA under an Emergency Use Authorization (EUA). This EUA will remain in effect (meaning this test can be used) for the duration of the COVID-19 declaration under Section 564(b)(1) of the Act, 21 U.S.C. section 360bbb-3(b)(1), unless the authorization is terminated or revoked.  Performed at [facility], [address] [place], [place] [address]   Lactic acid, plasma     Status: Abnormal   Collection Time: 03/03/21  2:51 PM  Result Value Ref Range   Lactic Acid, Venous 3.6 (HH) 0.5 - 1.9 mmol/L    Comment: CRITICAL VALUE NOTED.  VALUE IS CONSISTENT WITH PREVIOUSLY REPORTED AND CALLED VALUE. Performed at [facility], [address] [place], [place] [address]   Glucose, capillary     Status: Abnormal   Collection Time: 03/03/21  4:59 PM  Result Value Ref Range   Glucose-Capillary 131 (H) 70 - 99 mg/dL    Comment: Glucose reference range applies only to samples taken after fasting for at least 8 hours.  MRSA PCR Screening     Status: None   Collection Time: 03/03/21  6:15 PM   Specimen: Nasopharyngeal  Result Value Ref Range   MRSA by PCR NEGATIVE NEGATIVE    Comment:        The GeneXpert MRSA Assay (FDA approved for NASAL specimens only), is one component of a comprehensive MRSA colonization surveillance program. It is not intended to  diagnose MRSA infection nor to guide or monitor treatment for MRSA infections. Performed at [facility], [address], [place], [place] [address]   Lactic acid, plasma     Status: Abnormal   Collection Time: 03/03/21  6:53 PM  Result Value Ref Range   Lactic Acid, Venous 3.2 (HH) 0.5 - 1.9 mmol/L    Comment: CRITICAL VALUE NOTED.  VALUE IS CONSISTENT WITH PREVIOUSLY REPORTED AND CALLED VALUE. Performed at [facility], [address], [place], [place] [address]   Glucose, capillary     Status: Abnormal   Collection Time: 03/03/21  8:22 PM  Result Value Ref Range   Glucose-Capillary 126 (H) 70 - 99 mg/dL    Comment: Glucose reference range applies only to samples taken after fasting for at least 8 hours.  Lactic acid, plasma     Status: None   Collection Time: 03/03/21  9:49 PM  Result Value Ref Range   Lactic Acid, Venous 1.9 0.5 - 1.9 mmol/L    Comment: Performed at [facility], [address], [place], [place] [address]  Glucose, capillary     Status: Abnormal   Collection Time: 03/04/21  1:04 AM  Result Value Ref Range   Glucose-Capillary  113 (H) 70 - 99 mg/dL    Comment: Glucose reference range applies only to samples taken after fasting for at least 8 hours.  Protime-INR     Status: Abnormal   Collection Time: 03/04/21  4:19 AM  Result Value Ref Range   Prothrombin Time 16.8 (H) 11.4 - 15.2 seconds   INR 1.4 (H) 0.8 - 1.2    Comment: (NOTE) INR goal varies based on device and disease states. Performed at [facility], [address], [place], [place] [address]   Basic metabolic panel     Status: Abnormal   Collection Time: 03/04/21  4:19 AM  Result Value Ref Range   Sodium 145 135 - 145 mmol/L   Potassium 3.3 (L) 3.5 - 5.1 mmol/L   Chloride 113 (H) 98 - 111 mmol/L   CO2 22 22 - 32 mmol/L   Glucose, Bld 102 (H) 70 - 99 mg/dL    Comment: Glucose reference range applies only to samples taken after fasting for at least 8 hours.   BUN 56 (H) 8 - 23 mg/dL   Creatinine,  Ser 0.92 0.61 - 1.24 mg/dL   Calcium 8.8 (L) 8.9 - 10.3 mg/dL   GFR, Estimated >60 >60 mL/min    Comment: (NOTE) Calculated using the CKD-EPI Creatinine Equation (2021)    Anion gap 10 5 - 15    Comment: Performed at [facility], [address] [place], [place] [address]  CBC     Status: Abnormal   Collection Time: 03/04/21  4:19 AM  Result Value Ref Range   WBC 9.4 4.0 - 10.5 K/uL   RBC 4.04 (L) 4.22 - 5.81 MIL/uL   Hemoglobin 12.2 (L) 13.0 - 17.0 g/dL   HCT 38.8 (L) 39.0 - 52.0 %   MCV 96.0 80.0 - 100.0 fL   MCH 30.2 26.0 - 34.0 pg   MCHC 31.4 30.0 - 36.0 g/dL   RDW 14.2 11.5 - 15.5 %   Platelets 180 150 - 400 K/uL   nRBC 0.0 0.0 - 0.2 %    Comment: Performed at [facility], [address], [place], [place] [address]  Glucose, capillary     Status: Abnormal   Collection Time: 03/04/21  4:35 AM  Result Value Ref Range   Glucose-Capillary 115 (H) 70 - 99 mg/dL    Comment: Glucose reference range applies only to samples taken after fasting for at least 8 hours.  Glucose, capillary     Status: Abnormal   Collection Time: 03/04/21  7:24 AM  Result Value Ref Range   Glucose-Capillary 103 (H) 70 - 99 mg/dL    Comment: Glucose reference range applies only to samples taken after fasting for at least 8 hours.    DG Chest Port 1 View  Result Date: 03/03/2021 CLINICAL DATA:  Lethargy. EXAM: PORTABLE CHEST 1 VIEW COMPARISON:  Chest x-ray 09/06/2020. FINDINGS: Cardiomegaly. No pulmonary venous congestion. Low lung volumes with bibasilar atelectasis/infiltrates. No pleural effusion or pneumothorax. Gastric distention noted. Opacity noted over the right upper chest may be outside the patient. Diffuse osteopenia. Degenerative change thoracic spine and both shoulders. IMPRESSION: 1.  Low lung volumes with bibasilar atelectasis/infiltrates. 2.  Gastric distention. Electronically Signed   By: Marcello Moores  Register   On: 03/03/2021 12:55   DG Chest Port 1V same Day  Result Date: 03/03/2021 CLINICAL  DATA:  NG tube placement. EXAM: PORTABLE CHEST 1 VIEW COMPARISON:  03/03/2021. FINDINGS: NG tube noted with tip and side hole over the stomach. Prominent gastric  distention noted. Cardiomegaly. No pulmonary venous congestion. Low lung volumes with bibasilar atelectasis. No pleural effusion or pneumothorax. Degenerative changes thoracolumbar spine. IMPRESSION: 1. NG tube noted with tip and side hole over the stomach. Prominent gastric distention noted. 2.  Cardiomegaly.  No pulmonary venous congestion. 3.  Low lung volumes with bibasilar atelectasis. Electronically Signed   By: Marcello Moores  Register   On: 03/03/2021 17:00   CT Renal Stone Study  Result Date: 03/03/2021 CLINICAL DATA:  Flank pain. EXAM: CT ABDOMEN AND PELVIS WITHOUT CONTRAST TECHNIQUE: Multidetector CT imaging of the abdomen and pelvis was performed following the standard protocol without IV contrast. COMPARISON:  September 07, 2020. FINDINGS: Lower chest: Mild bilateral posterior basilar atelectasis or infiltrates are noted. Hepatobiliary: No focal liver abnormality is seen. No gallstones, gallbladder wall thickening, or biliary dilatation. Pancreas: Unremarkable. No pancreatic ductal dilatation or surrounding inflammatory changes. Spleen: Normal in size without focal abnormality. Adrenals/Urinary Tract: Adrenal glands appear normal. Bilateral nonobstructive nephrolithiasis is noted. 8 mm nonobstructive calculus is noted in right renal pelvis. No hydronephrosis or renal obstruction is noted. Several bladder diverticula are noted. Stomach/Bowel: Moderate gastric distention is noted. Small bowel dilatation is noted with probable transition zone in right lower quadrant as there is nondilated small bowel in this area. Status post right partial colectomy. No colonic dilatation is noted. Stool is noted in the rectum. Vascular/Lymphatic: Aortic atherosclerosis. No enlarged abdominal or pelvic lymph nodes. Reproductive: Prostate is unremarkable. Other: No  abdominal wall hernia or abnormality. No abdominopelvic ascites. Musculoskeletal: No acute or significant osseous findings. IMPRESSION: Moderate bilateral posterior basilar atelectasis or infiltrates are noted. Moderate gastric distention is noted with proximal small bowel dilatation with possible transition zone seen in right lower quadrant. This is concerning for probable partial small bowel obstruction. Status post partial right colectomy. Bilateral nonobstructive nephrolithiasis. 8 mm nonobstructive calculus seen in right renal pelvis. Aortic Atherosclerosis (ICD10-I70.0). Electronically Signed   By: Marijo Conception M.D.   On: 03/03/2021 13:57    ROS:  Review of systems not obtained due to patient factors.  Blood pressure 117/65, pulse 80, temperature 98.4 F (36.9 C), temperature source Oral, resp. rate 20, height 6' (1.829 m), weight 105 kg, SpO2 97 %. Physical Exam: Morbidly obese white male who is lying in the bed in no acute distress.  He does answer simple questions and nods. Lungs are relatively clear to auscultation, though his inspiratory effort is limited. Head is atraumatic with Bell's palsy present Heart examination reveals regular rate and rhythm without S3, S4, murmurs Abdomen is soft, nontender, nondistended.  Minimal bowel sounds appreciated.  CT scan images personally reviewed.  NG tube is in place with almost 900 cc of output.  CT scan from earlier this year also reviewed.  Assessment/Plan: Impression: Sepsis secondary to pulmonary process.  Patient has a partial small bowel obstruction versus ileus.  Status post right hemicolectomy for stage III colon carcinoma.  Patient has multiple medical problems including dementia, diabetes, morbid obesity, oxygen requirements. Plan: Would continue NG tube decompression.  Patient is a poor candidate for surgical intervention.  We will start Dulcolax suppositories.  May need small bowel obstruction protocol series if this does not  resolve.  We will follow with you.  We will get CEA level.  Aviva Signs 03/04/2021, 7:41 AM

## 2021-03-04 NOTE — NC FL2 (Signed)
[place] LEVEL OF CARE SCREENING TOOL     IDENTIFICATION  Patient Name: [name] Birthdate: 06/20/1946 Sex: male Admission Date (Current Location): 03/03/2021  [place] and [place] Number:  [place] [facility] [id number] [place] and Address:  [facility] [address], [place]      Provider Number: [id number]  Attending Physician Name and Address:  Murlean Iba, MD  Relative Name and Phone Number:  Creed Copper (Other)   [phone]    Current Level of Care: Hospital Recommended Level of Care: [place] Prior Approval Number:    Date Approved/Denied:   PASRR Number:    Discharge Plan: SNF    Current Diagnoses: Patient Active Problem List   Diagnosis Date Noted  . Sepsis ([place]) 03/03/2021  . Aspiration pneumonia of both lower lobes due to gastric secretions ([place]) 03/03/2021  . SBO (small bowel obstruction) ([place]) 03/03/2021  . Acute lower UTI 03/03/2021  . H/O colon cancer, stage III 12/30/2020  . Elevated LFTs   . Pressure injury of skin 09/07/2020  . Bacteremia due to Klebsiella pneumoniae 09/06/2020  . AKI (acute kidney injury) ([place]) 09/06/2020  . Lactic acidosis 09/06/2020  . Mild protein-calorie malnutrition ([place]) 09/06/2020  . Septic shock ([place]) 09/06/2020  . Cecal cancer ([place])   . Colonic mass 05/09/2019  . 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 Abdominal pain 05/08/2019  . Dementia without behavioral disturbance ([place])   . SOB (shortness of breath) on exertion   . Hypotension   . Acute blood loss anemia 05/01/2018  . Heme positive stool 05/01/2018  . Guaiac positive stools   . Iron deficiency anemia due to chronic blood loss   . GIB (gastrointestinal bleeding) 04/30/2018  . History of Bleeding ulcer   . Essential hypertension   . Encounter for screening colonoscopy 10/08/2011  . CLOSED DISLOCATION OF FOOT UNSPECIFIED PART 01/15/2010    Orientation RESPIRATION BLADDER Height & Weight      Self,Place  O2 Incontinent Weight: 231 lb 7.7 oz (105 kg) Height:  6' (182.9 cm)  BEHAVIORAL SYMPTOMS/MOOD NEUROLOGICAL BOWEL NUTRITION STATUS      Incontinent  (see discharge summary)  AMBULATORY STATUS COMMUNICATION OF NEEDS Skin   Total Care Verbally Normal                       Personal Care Assistance Level of Assistance  Bathing,Feeding,Dressing Bathing Assistance: Maximum assistance Feeding assistance: Limited assistance Dressing Assistance: Maximum assistance     Functional Limitations Info  Hearing,Sight,Speech Sight Info: Adequate   Speech Info: Adequate    SPECIAL CARE FACTORS FREQUENCY                       Contractures Contractures Info: Not present    Additional Factors Info  Code Status,Allergies Code Status Info: Full Code Allergies Info: Hydrocodone, Hydrocodone-acetaminophen, aplisol           Current Medications (03/04/2021):  This is the current hospital active medication list Current Facility-Administered Medications  Medication Dose Route Frequency Provider Last Rate Last Admin  . acetaminophen (TYLENOL) tablet 650 mg  650 mg Oral Q6H PRN Agbata, Tochukwu, MD       Or  . acetaminophen (TYLENOL) suppository 650 mg  650 mg Rectal Q6H PRN Agbata, Tochukwu, MD      . bisacodyl (DULCOLAX) suppository 10 mg  10 mg Rectal BID Aviva Signs, MD   10 mg at 03/04/21 0824  . Chlorhexidine Gluconate Cloth 2 %  PADS 6 each  6 each Topical Daily Aviva Signs, MD   6 each at 03/04/21 0830  . cycloSPORINE (RESTASIS) 0.05 % ophthalmic emulsion 1 drop  1 drop Both Eyes BID Agbata, Tochukwu, MD   1 drop at 03/04/21 0824  . enoxaparin (LOVENOX) injection 40 mg  40 mg Subcutaneous Q24H Agbata, Tochukwu, MD   40 mg at 03/03/21 2035  . lactated ringers infusion   Intravenous Continuous Margarita Mail, PA-C 150 mL/hr at 03/03/21 1539 New Bag at 03/03/21 1539  . lactated ringers infusion   Intravenous Continuous Agbata, Tochukwu, MD 125 mL/hr at 03/03/21 2244  New Bag at 03/03/21 2244  . ondansetron (ZOFRAN) tablet 4 mg  4 mg Oral Q6H PRN Agbata, Tochukwu, MD       Or  . ondansetron (ZOFRAN) injection 4 mg  4 mg Intravenous Q6H PRN Agbata, Tochukwu, MD      . pantoprazole (PROTONIX) injection 40 mg  40 mg Intravenous Q24H Agbata, Tochukwu, MD   40 mg at 03/03/21 2143  . piperacillin-tazobactam (ZOSYN) IVPB 3.375 g  3.375 g Intravenous Q8H Hall, Scott A, RPH 12.5 mL/hr at 03/04/21 0826 3.375 g at 03/04/21 5974     Discharge Medications: Please see discharge summary for a list of discharge medications.  Relevant Imaging Results:  Relevant Lab Results:   Additional Information SS # [id number]  Ihor Gully, LCSW

## 2021-03-04 NOTE — TOC Initial Note (Addendum)
Transition of Care [facility]) - Initial/Assessment Note    Patient Details  Name: [name] MRN: [id number] Date of Birth: 07/23/1946  [facility]) CM/SW Contact:    Ihor Gully, LCSW Phone Number: 03/04/2021, 1:35 PM  Clinical Narrative:                 Patient is a LTC resident at [facility]. Patient feeds himself. He uses a lift for transfers to wc. He is total care for ADLs. He uses 3L oxygen at baseline.  Expected Discharge Plan: Skilled Nursing Facility Barriers to Discharge: Continued Medical Work up   Patient Goals and CMS Choice Patient states their goals for this hospitalization and ongoing recovery are:: return to [place]      Expected Discharge Plan and Services Expected Discharge Plan: [place] Acute Care Choice: [place]                                        Prior Living Arrangements/Services   Lives with:: Facility Resident Patient language and need for interpreter reviewed:: Yes Do you feel safe going back to the place where you live?: Yes      Need for Family Participation in Patient Care: Yes (Comment) Care giver support system in place?: Yes (comment)   Criminal Activity/Legal Involvement Pertinent to Current Situation/Hospitalization: No - Comment as needed  Activities of Daily Living Home Assistive Devices/Equipment: Bathtub lift,Hospital bed,Oxygen ADL Screening (condition at time of admission) Patient's cognitive ability adequate to safely complete daily activities?: No Is the patient deaf or have difficulty hearing?: Yes Does the patient have difficulty seeing, even when wearing glasses/contacts?: Yes Does the patient have difficulty concentrating, remembering, or making decisions?: Yes Patient able to express need for assistance with ADLs?: Yes Does the patient have difficulty dressing or bathing?: Yes Independently performs ADLs?: No Communication: Needs assistance Dressing  (OT): Dependent Grooming: Dependent Feeding: Dependent Bathing: Dependent Toileting: Dependent In/Out Bed: Dependent Walks in Home: Dependent Does the patient have difficulty walking or climbing stairs?: Yes Weakness of Legs: Both Weakness of Arms/Hands: Both  Permission Sought/Granted                  Emotional Assessment     Affect (typically observed): Appropriate Orientation: : Oriented to Self,Oriented to Place Alcohol / Substance Use: Not Applicable Psych Involvement: No (comment)  Admission diagnosis:  Encounter for imaging study to confirm nasogastric (NG) tube placement [Z01.89] Sepsis due to urinary tract infection ([place]) [A41.9, N39.0] Sepsis ([place]) [A41.9] Patient Active Problem List   Diagnosis Date Noted  . Sepsis ([place]) 03/03/2021  . Aspiration pneumonia of both lower lobes due to gastric secretions ([place]) 03/03/2021  . SBO (small bowel obstruction) ([place]) 03/03/2021  . Acute lower UTI 03/03/2021  . H/O colon cancer, stage III 12/30/2020  . Elevated LFTs   . Pressure injury of skin 09/07/2020  . Bacteremia due to Klebsiella pneumoniae 09/06/2020  . AKI (acute kidney injury) ([place]) 09/06/2020  . Lactic acidosis 09/06/2020  . Mild protein-calorie malnutrition ([place]) 09/06/2020  . Septic shock ([place]) 09/06/2020  . 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 Colonic mass 05/09/2019  . 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 Abdominal pain 05/08/2019  . Dementia without behavioral disturbance ([place])   . SOB (shortness of breath) on exertion   . Hypotension   . Acute blood loss anemia 05/01/2018  .  Heme positive stool 05/01/2018  . Guaiac positive stools   . Iron deficiency anemia due to chronic blood loss   . GIB (gastrointestinal bleeding) 04/30/2018  . History of Bleeding ulcer   . Essential hypertension   . Encounter for screening colonoscopy 10/08/2011  . CLOSED DISLOCATION OF FOOT UNSPECIFIED PART 01/15/2010   PCP:  Caprice Renshaw, MD Pharmacy:   [facility] [address] [place] [address] Phone: [phone] Fax: [phone]     Social Determinants of Health ([place]) Interventions    Readmission Risk Interventions Readmission Risk Prevention Plan 05/15/2019  Post Dischage Appt Not Complete  Appt Comments Pt returning to SNF. SNF MD will follow up at the facility  Medication Screening Complete  Transportation Screening Complete  Some recent data might be hidden

## 2021-03-05 ENCOUNTER — Inpatient Hospital Stay (HOSPITAL_COMMUNITY): Payer: Medicare (Managed Care)

## 2021-03-05 LAB — URINE CULTURE: Culture: >=100000 — AB

## 2021-03-05 LAB — BASIC METABOLIC PANEL
Anion gap: 8 (ref 5–15)
BUN: 44 mg/dL — ABNORMAL HIGH (ref 8–23)
CO2: 24 mmol/L (ref 22–32)
Calcium: 9.2 mg/dL (ref 8.9–10.3)
Chloride: 119 mmol/L — ABNORMAL HIGH (ref 98–111)
Creatinine, Ser: 0.85 mg/dL (ref 0.61–1.24)
GFR, Estimated: >60 mL/min (ref >60)
Glucose, Bld: 80 mg/dL (ref 70–99)
Potassium: 3.9 mmol/L (ref 3.5–5.1)
Sodium: 151 mmol/L — ABNORMAL HIGH (ref 135–145)

## 2021-03-05 LAB — CEA: CEA: 5.9 ng/mL — ABNORMAL HIGH (ref 0.0–4.7)

## 2021-03-05 LAB — GLUCOSE, CAPILLARY
Glucose-Capillary: 84 mg/dL (ref 70–99)
Glucose-Capillary: 83 mg/dL (ref 70–99)
Glucose-Capillary: 85 mg/dL (ref 70–99)
Glucose-Capillary: 86 mg/dL (ref 70–99)

## 2021-03-05 LAB — MAGNESIUM: Magnesium: 2.5 mg/dL — ABNORMAL HIGH (ref 1.7–2.4)

## 2021-03-05 MED ORDER — DIATRIZOATE MEGLUMINE & SODIUM 66-10 % PO SOLN
90.0000 mL | Freq: Once | ORAL | Status: AC
  Administered 2021-03-05: 90 mL via NASOGASTRIC
  Filled 2021-03-05: qty 90

## 2021-03-05 MED ORDER — DIATRIZOATE MEGLUMINE & SODIUM 66-10 % PO SOLN
ORAL | Status: AC
  Administered 2021-03-05: 30 mL
  Filled 2021-03-05: qty 90

## 2021-03-05 MED ORDER — DEXTROSE-NACL 5-0.45 % IV SOLN: INTRAVENOUS | Status: DC

## 2021-03-05 MED ORDER — ONDANSETRON HCL 4 MG PO TABS: 4.0000 mg | ORAL_TABLET | Freq: Four times a day (QID) | ORAL | Status: DC | PRN

## 2021-03-05 MED ORDER — KCL IN DEXTROSE-NACL 20-5-0.9 MEQ/L-%-% IV SOLN: INTRAVENOUS | Status: DC

## 2021-03-05 MED ORDER — ACETAMINOPHEN 160 MG/5ML PO SOLN
650.0000 mg | Freq: Four times a day (QID) | ORAL | Status: DC | PRN
  Filled 2021-03-05: qty 20.3

## 2021-03-05 NOTE — Progress Notes (Addendum)
Notified AC of patient needing ultrasound IV placement, she told me to contact ED. I called ED and a nurse came up and attempted x2 could not get IV in. I am alerting doctor now. I will follow up.   Follow up: We were able to get an IV in patient. Fluids are running. He maintained his NPO status.

## 2021-03-05 NOTE — Progress Notes (Signed)
TRH night shift.  The nursing staff reports that the patient's blood glucose have been trending down since yesterday morning.  At 1100 was 99 mg/dL, 92 mg/dL at 1630, 85 mg/dL at 2000 and midnight was 76 mg/dL.  D5 NS plus KCl 20 mEq at 75 mL/h ordered.  Tennis Must, MD.

## 2021-03-05 NOTE — Progress Notes (Addendum)
Patient has NG tube in his hand and has removed it , will notify Dr. Wynetta Emery , Dr. Arnoldo Morale, Per Dr. Arnoldo Morale no reason to re-insert at this time.

## 2021-03-05 NOTE — Progress Notes (Signed)
PROGRESS NOTE   [name]  ZWC:[id number] DOB: 1946/05/04 DOA: 03/03/2021 PCP: Caprice Renshaw, MD   Chief Complaint  Patient presents with   Fatigue   Level of care: Med-Surg  Brief Admission History:  75 year old gentleman long-term resident at [facility] presented to the emergency department with hypoxia.  He chronically wears 3 L of oxygen.  He was noted to have sepsis UTI and bilateral lower lobe infiltrates and started on treatment for aspiration pneumonia and pyuria.  He also was noted to have acute urinary retention and had a Foley catheter placed.  In addition he was noted to have a probable partial small bowel obstruction and surgery was consulted.     Assessment & Plan:   Principal Problem:   Sepsis ([place]) Active Problems:   Essential hypertension   Dementia without behavioral disturbance (HCC)   Lactic acidosis   Aspiration pneumonia of both lower lobes due to gastric secretions (HCC)   SBO (small bowel obstruction) (HCC)   Acute lower UTI   Sepsis - secondary to UTI and aspiration pneumonia -his sepsis physiology has resolved with aggressive treatments continue IV antibiotics and current orders.  Lactic acid has normalized.  He is clinically improving.   Partial small bowel obstruction-appreciate surgery consultation continue NG tube and supportive measures.  Possibly will need a small bowel follow-through study.  He remains a very poor candidate for surgical intervention.  Dulcolax suppositories started by surgery.  Follow-up CEA level ordered by surgery.  Continue NG suction.  Surgery planning small bowel follow through study today.    Lactic acidosis -resolved.  UTI-currently being treated with IV antibiotics.  Follow up culture results.  Acute urinary retention-treated with Foley placement.  Foley DC'd on 6/8.  He passed a voiding trial.  Type 2 diabetes mellitus-continue SSI coverage and CBG monitoring.  Dementia-stable on home medications  Hypokalemia -  repleted.   Hypernatremia - change to hypotonic IV fluids with dextrose.   DVT prophylaxis: Enoxaparin Code Status: Full Family Communication: None present Disposition: Return to SNF when medically stable Status is: Inpatient  Remains inpatient appropriate because:IV treatments appropriate due to intensity of illness or inability to take PO and Inpatient level of care appropriate due to severity of illness  Dispo: The patient is from: SNF              Anticipated d/c is to: SNF              Patient currently is not medically stable to d/c.   Difficult to place patient No   Consultants:  Surgery   Procedures:    Antimicrobials:  Zosyn 6/7>>   Subjective: Had large BM yesterday.  No specific complaints.    Objective: Vitals:   03/04/21 1403 03/04/21 1457 03/04/21 2212 03/05/21 0553  BP: 125/62  108/83 (!) 124/56  Pulse: 78  72 67  Resp: 19  18 16   Temp: 97.6 F (36.4 C)  97.6 F (36.4 C) 98.5 F (36.9 C)  TempSrc: Oral  Oral   SpO2: 98% 99% 100% 99%  Weight:      Height:        Intake/Output Summary (Last 24 hours) at 03/05/2021 1135 Last data filed at 03/05/2021 0900 Gross per 24 hour  Intake 624.93 ml  Output 1700 ml  Net -1075.07 ml   Filed Weights   03/03/21 1150  Weight: 105 kg    Examination:  General exam: awake,alert, Appears calm and comfortable. NG in place.  Respiratory system: BBS  clear to ausc, shallow breathing, no rales heard. Cardiovascular system: normal S1 & S2 heard. No JVD, murmurs, rubs, gallops or clicks. No pedal edema. Gastrointestinal system: Abdomen is nondistended, soft and nontender. No organomegaly or masses felt. Normal bowel sounds minimal. Central nervous system: Alert and oriented to person. No focal neurological deficits. Extremities: Symmetric 5 x 5 power. Skin: No rashes, lesions or ulcers Psychiatry: Judgement and insight appear poor. Mood & affect appropriate.   Data Reviewed: I have personally reviewed following  labs and imaging studies  CBC: Recent Labs  Lab 03/01/21 2238 03/03/21 1214 03/04/21 0419  WBC 9.4 13.3* 9.4  NEUTROABS  --  10.7*  --   HGB 13.4 15.2 12.2*  HCT 41.1 47.5 38.8*  MCV 92.8 93.3 96.0  PLT 190 271 160    Basic Metabolic Panel: Recent Labs  Lab 03/01/21 2238 03/03/21 1214 03/04/21 0419 03/05/21 0433  NA 138 144 145 151*  K 4.9 4.1 3.3* 3.9  CL 100 107 113* 119*  CO2 26 22 22 24   GLUCOSE 135* 164* 102* 80  BUN 50* 67* 56* 44*  CREATININE 1.41* 1.26* 0.92 0.85  CALCIUM 9.0 9.4 8.8* 9.2  MG  --   --   --  2.5*    GFR: Estimated Creatinine Clearance: 92.7 mL/min (by C-G formula based on SCr of 0.85 mg/dL).  Liver Function Tests: Recent Labs  Lab 03/01/21 2238 03/03/21 1214  AST 23 17  ALT 18 17  ALKPHOS 84 77  BILITOT 1.5* 1.3*  PROT 7.2 7.5  ALBUMIN 3.4* 3.4*    CBG: Recent Labs  Lab 03/04/21 2016 03/04/21 2356 03/05/21 0550 03/05/21 0719 03/05/21 1130  GLUCAP 85 76 84 83 85    Recent Results (from the past 240 hour(s))  Urine culture     Status: Abnormal   Collection Time: 03/02/21  1:28 AM   Specimen: Urine, Catheterized  Result Value Ref Range Status   Specimen Description   Final    URINE, CATHETERIZED Performed at [facility], [address] [place], [place] [address]    Special Requests   Final    NONE Performed at [facility], [address] [place], [place] [address]    Culture >=100,000 COLONIES/mL KLEBSIELLA PNEUMONIAE (A)  Final   Report Status 03/05/2021 FINAL  Final   Organism ID, Bacteria KLEBSIELLA PNEUMONIAE (A)  Final      Susceptibility   Klebsiella pneumoniae - MIC*    AMPICILLIN RESISTANT Resistant     CEFAZOLIN <=4 SENSITIVE Sensitive     CEFEPIME <=0.12 SENSITIVE Sensitive     CEFTRIAXONE <=0.25 SENSITIVE Sensitive     CIPROFLOXACIN <=0.25 SENSITIVE Sensitive     GENTAMICIN <=1 SENSITIVE Sensitive     IMIPENEM <=0.25 SENSITIVE Sensitive     NITROFURANTOIN <=16 SENSITIVE Sensitive     TRIMETH/SULFA  <=20 SENSITIVE Sensitive     AMPICILLIN/SULBACTAM 4 SENSITIVE Sensitive     PIP/TAZO <=4 SENSITIVE Sensitive     * >=100,000 COLONIES/mL KLEBSIELLA PNEUMONIAE  Culture, blood (Routine x 2)     Status: None (Preliminary result)   Collection Time: 03/03/21 12:14 PM   Specimen: BLOOD RIGHT ARM  Result Value Ref Range Status   Specimen Description BLOOD RIGHT ARM  Final   Special Requests   Final    BOTTLES DRAWN AEROBIC AND ANAEROBIC Blood Culture results may not be optimal due to an inadequate volume of blood received in culture bottles   Culture   Final    NO GROWTH  2 DAYS Performed at [facility], [address], [place], [place] [address]    Report Status PENDING  Incomplete  Culture, blood (Routine x 2)     Status: None (Preliminary result)   Collection Time: 03/03/21 12:14 PM   Specimen: BLOOD  Result Value Ref Range Status   Specimen Description BLOOD LEFT ANTECUBITAL  Final   Special Requests   Final    BOTTLES DRAWN AEROBIC AND ANAEROBIC Blood Culture results may not be optimal due to an inadequate volume of blood received in culture bottles   Culture   Final    NO GROWTH 2 DAYS Performed at [facility], [address], [place], [place] [address]    Report Status PENDING  Incomplete  Resp Panel by RT-PCR (Flu A&B, Covid) Nasopharyngeal Swab     Status: None   Collection Time: 03/03/21 12:53 PM   Specimen: Nasopharyngeal Swab; Nasopharyngeal(NP) swabs in vial transport medium  Result Value Ref Range Status   SARS Coronavirus 2 by RT PCR NEGATIVE NEGATIVE Final    Comment: (NOTE) SARS-CoV-2 target nucleic acids are NOT DETECTED.  The SARS-CoV-2 RNA is generally detectable in upper respiratory specimens during the acute phase of infection. The lowest concentration of SARS-CoV-2 viral copies this assay can detect is 138 copies/mL. A negative result does not preclude SARS-Cov-2 infection and should not be used as the sole basis for treatment or other patient management  decisions. A negative result may occur with  improper specimen collection/handling, submission of specimen other than nasopharyngeal swab, presence of viral mutation(s) within the areas targeted by this assay, and inadequate number of viral copies(<138 copies/mL). A negative result must be combined with clinical observations, patient history, and epidemiological information. The expected result is Negative.  Fact Sheet for Patients:  EntrepreneurPulse.com.au  Fact Sheet for Healthcare Providers:  IncredibleEmployment.be  This test is no t yet approved or cleared by the [place] FDA and  has been authorized for detection and/or diagnosis of SARS-CoV-2 by FDA under an Emergency Use Authorization (EUA). This EUA will remain  in effect (meaning this test can be used) for the duration of the COVID-19 declaration under Section 564(b)(1) of the Act, 21 U.S.C.section 360bbb-3(b)(1), unless the authorization is terminated  or revoked sooner.       Influenza A by PCR NEGATIVE NEGATIVE Final   Influenza B by PCR NEGATIVE NEGATIVE Final    Comment: (NOTE) The Xpert Xpress SARS-CoV-2/FLU/RSV plus assay is intended as an aid in the diagnosis of influenza from Nasopharyngeal swab specimens and should not be used as a sole basis for treatment. Nasal washings and aspirates are unacceptable for Xpert Xpress SARS-CoV-2/FLU/RSV testing.  Fact Sheet for Patients: EntrepreneurPulse.com.au  Fact Sheet for Healthcare Providers: IncredibleEmployment.be  This test is not yet approved or cleared by the [place] FDA and has been authorized for detection and/or diagnosis of SARS-CoV-2 by FDA under an Emergency Use Authorization (EUA). This EUA will remain in effect (meaning this test can be used) for the duration of the COVID-19 declaration under Section 564(b)(1) of the Act, 21 U.S.C. section 360bbb-3(b)(1), unless the  authorization is terminated or revoked.  Performed at [facility], [address] [place], [place] [address]   MRSA PCR Screening     Status: None   Collection Time: 03/03/21  6:15 PM   Specimen: Nasopharyngeal  Result Value Ref Range Status   MRSA by PCR NEGATIVE NEGATIVE Final    Comment:        The GeneXpert MRSA  Assay (FDA approved for NASAL specimens only), is one component of a comprehensive MRSA colonization surveillance program. It is not intended to diagnose MRSA infection nor to guide or monitor treatment for MRSA infections. Performed at [facility], [address], [place], [place] [address]      Radiology Studies: [facility] 1 View  Result Date: 03/03/2021 CLINICAL DATA:  Lethargy. EXAM: PORTABLE CHEST 1 VIEW COMPARISON:  Chest x-ray 09/06/2020. FINDINGS: Cardiomegaly. No pulmonary venous congestion. Low lung volumes with bibasilar atelectasis/infiltrates. No pleural effusion or pneumothorax. Gastric distention noted. Opacity noted over the right upper chest may be outside the patient. Diffuse osteopenia. Degenerative change thoracic spine and both shoulders. IMPRESSION: 1.  Low lung volumes with bibasilar atelectasis/infiltrates. 2.  Gastric distention. Electronically Signed   By: Marcello Moores  Register   On: 03/03/2021 12:55   DG Chest Port 1V same Day  Result Date: 03/03/2021 CLINICAL DATA:  NG tube placement. EXAM: PORTABLE CHEST 1 VIEW COMPARISON:  03/03/2021. FINDINGS: NG tube noted with tip and side hole over the stomach. Prominent gastric distention noted. Cardiomegaly. No pulmonary venous congestion. Low lung volumes with bibasilar atelectasis. No pleural effusion or pneumothorax. Degenerative changes thoracolumbar spine. IMPRESSION: 1. NG tube noted with tip and side hole over the stomach. Prominent gastric distention noted. 2.  Cardiomegaly.  No pulmonary venous congestion. 3.  Low lung volumes with bibasilar atelectasis. Electronically Signed   By: Marcello Moores  Register    On: 03/03/2021 17:00   CT Renal Stone Study  Result Date: 03/03/2021 CLINICAL DATA:  Flank pain. EXAM: CT ABDOMEN AND PELVIS WITHOUT CONTRAST TECHNIQUE: Multidetector CT imaging of the abdomen and pelvis was performed following the standard protocol without IV contrast. COMPARISON:  September 07, 2020. FINDINGS: Lower chest: Mild bilateral posterior basilar atelectasis or infiltrates are noted. Hepatobiliary: No focal liver abnormality is seen. No gallstones, gallbladder wall thickening, or biliary dilatation. Pancreas: Unremarkable. No pancreatic ductal dilatation or surrounding inflammatory changes. Spleen: Normal in size without focal abnormality. Adrenals/Urinary Tract: Adrenal glands appear normal. Bilateral nonobstructive nephrolithiasis is noted. 8 mm nonobstructive calculus is noted in right renal pelvis. No hydronephrosis or renal obstruction is noted. Several bladder diverticula are noted. Stomach/Bowel: Moderate gastric distention is noted. Small bowel dilatation is noted with probable transition zone in right lower quadrant as there is nondilated small bowel in this area. Status post right partial colectomy. No colonic dilatation is noted. Stool is noted in the rectum. Vascular/Lymphatic: Aortic atherosclerosis. No enlarged abdominal or pelvic lymph nodes. Reproductive: Prostate is unremarkable. Other: No abdominal wall hernia or abnormality. No abdominopelvic ascites. Musculoskeletal: No acute or significant osseous findings. IMPRESSION: Moderate bilateral posterior basilar atelectasis or infiltrates are noted. Moderate gastric distention is noted with proximal small bowel dilatation with possible transition zone seen in right lower quadrant. This is concerning for probable partial small bowel obstruction. Status post partial right colectomy. Bilateral nonobstructive nephrolithiasis. 8 mm nonobstructive calculus seen in right renal pelvis. Aortic Atherosclerosis (ICD10-I70.0). Electronically Signed    By: Marijo Conception M.D.   On: 03/03/2021 13:57     Scheduled Meds:  bisacodyl  10 mg Rectal BID   Chlorhexidine Gluconate Cloth  6 each Topical Daily   cycloSPORINE  1 drop Both Eyes BID   enoxaparin (LOVENOX) injection  40 mg Subcutaneous Q24H   pantoprazole (PROTONIX) IV  40 mg Intravenous Q24H   Continuous Infusions:  dextrose 5 % and 0.45% NaCl 75 mL/hr at 03/05/21 1004   piperacillin-tazobactam (ZOSYN)  IV 3.375 g (03/05/21 1006)  LOS: 2 days   Time spent: 35 mins  Melynda Krzywicki Wynetta Emery, MD How to contact the [facility] Attending or Consulting provider [place] or covering provider during after hours [place], for this patient?  Check the care team in [facility] and look for a) attending/consulting [facility] provider listed and b) the [facility] team listed Log into [email or web address] and use [place]'s universal password to access. If you do not have the password, please contact the hospital operator. Locate the [facility] provider you are looking for under [facility] and page to a number that you can be directly reached. If you still have difficulty reaching the provider, please page the [facility] (Director on Call) for the Hospitalists listed on amion for assistance.  03/05/2021, 11:35 AM

## 2021-03-05 NOTE — Progress Notes (Signed)
Subjective: Patient denies any abdominal pain.  Objective: Vital signs in last 24 hours: Temp:  [97.6 F (36.4 C)-98.5 F (36.9 C)] 98.5 F (36.9 C) (06/09 0553) Pulse Rate:  [67-78] 67 (06/09 0553) Resp:  [16-19] 16 (06/09 0553) BP: (108-125)/(56-83) 124/56 (06/09 0553) SpO2:  [98 %-100 %] 99 % (06/09 0553)    Intake/Output from previous day: 06/08 0701 - 06/09 0700 In: 624.9 [I.V.:322.6; IV Piggyback:302.4] Out: 1700 [Urine:500; Emesis/NG output:1200] Intake/Output this shift: No intake/output data recorded.  General appearance: alert and no distress GI: soft, non-tender; bowel sounds normal; no masses,  no organomegaly  Lab Results:  Recent Labs    03/03/21 1214 03/04/21 0419  WBC 13.3* 9.4  HGB 15.2 12.2*  HCT 47.5 38.8*  PLT 271 180   BMET Recent Labs    03/04/21 0419 03/05/21 0433  NA 145 151*  K 3.3* 3.9  CL 113* 119*  CO2 22 24  GLUCOSE 102* 80  BUN 56* 44*  CREATININE 0.92 0.85  CALCIUM 8.8* 9.2   PT/INR Recent Labs    03/03/21 1239 03/04/21 0419  LABPROT 15.3* 16.8*  INR 1.2 1.4*    Studies/Results: DG Chest Port 1 View  Result Date: 03/03/2021 CLINICAL DATA:  Lethargy. EXAM: PORTABLE CHEST 1 VIEW COMPARISON:  Chest x-ray 09/06/2020. FINDINGS: Cardiomegaly. No pulmonary venous congestion. Low lung volumes with bibasilar atelectasis/infiltrates. No pleural effusion or pneumothorax. Gastric distention noted. Opacity noted over the right upper chest may be outside the patient. Diffuse osteopenia. Degenerative change thoracic spine and both shoulders. IMPRESSION: 1.  Low lung volumes with bibasilar atelectasis/infiltrates. 2.  Gastric distention. Electronically Signed   By: Marcello Moores  Register   On: 03/03/2021 12:55   DG Chest Port 1V same Day  Result Date: 03/03/2021 CLINICAL DATA:  NG tube placement. EXAM: PORTABLE CHEST 1 VIEW COMPARISON:  03/03/2021. FINDINGS: NG tube noted with tip and side hole over the stomach. Prominent gastric distention  noted. Cardiomegaly. No pulmonary venous congestion. Low lung volumes with bibasilar atelectasis. No pleural effusion or pneumothorax. Degenerative changes thoracolumbar spine. IMPRESSION: 1. NG tube noted with tip and side hole over the stomach. Prominent gastric distention noted. 2.  Cardiomegaly.  No pulmonary venous congestion. 3.  Low lung volumes with bibasilar atelectasis. Electronically Signed   By: Marcello Moores  Register   On: 03/03/2021 17:00   CT Renal Stone Study  Result Date: 03/03/2021 CLINICAL DATA:  Flank pain. EXAM: CT ABDOMEN AND PELVIS WITHOUT CONTRAST TECHNIQUE: Multidetector CT imaging of the abdomen and pelvis was performed following the standard protocol without IV contrast. COMPARISON:  September 07, 2020. FINDINGS: Lower chest: Mild bilateral posterior basilar atelectasis or infiltrates are noted. Hepatobiliary: No focal liver abnormality is seen. No gallstones, gallbladder wall thickening, or biliary dilatation. Pancreas: Unremarkable. No pancreatic ductal dilatation or surrounding inflammatory changes. Spleen: Normal in size without focal abnormality. Adrenals/Urinary Tract: Adrenal glands appear normal. Bilateral nonobstructive nephrolithiasis is noted. 8 mm nonobstructive calculus is noted in right renal pelvis. No hydronephrosis or renal obstruction is noted. Several bladder diverticula are noted. Stomach/Bowel: Moderate gastric distention is noted. Small bowel dilatation is noted with probable transition zone in right lower quadrant as there is nondilated small bowel in this area. Status post right partial colectomy. No colonic dilatation is noted. Stool is noted in the rectum. Vascular/Lymphatic: Aortic atherosclerosis. No enlarged abdominal or pelvic lymph nodes. Reproductive: Prostate is unremarkable. Other: No abdominal wall hernia or abnormality. No abdominopelvic ascites. Musculoskeletal: No acute or significant osseous findings. IMPRESSION: Moderate bilateral posterior  basilar  atelectasis or infiltrates are noted. Moderate gastric distention is noted with proximal small bowel dilatation with possible transition zone seen in right lower quadrant. This is concerning for probable partial small bowel obstruction. Status post partial right colectomy. Bilateral nonobstructive nephrolithiasis. 8 mm nonobstructive calculus seen in right renal pelvis. Aortic Atherosclerosis (ICD10-I70.0). Electronically Signed   By: Marijo Conception M.D.   On: 03/03/2021 13:57    Anti-infectives: Anti-infectives (From admission, onward)    Start     Dose/Rate Route Frequency Ordered Stop   03/04/21 0200  vancomycin (VANCOCIN) IVPB 1000 mg/200 mL premix  Status:  Discontinued        1,000 mg 200 mL/hr over 60 Minutes Intravenous Every 12 hours 03/03/21 1435 03/03/21 1655   03/03/21 2100  ceFEPIme (MAXIPIME) 2 g in sodium chloride 0.9 % 100 mL IVPB  Status:  Discontinued        2 g 200 mL/hr over 30 Minutes Intravenous Every 8 hours 03/03/21 1435 03/03/21 1655   03/03/21 2030  azithromycin (ZITHROMAX) 500 mg in sodium chloride 0.9 % 250 mL IVPB  Status:  Discontinued        500 mg 250 mL/hr over 60 Minutes Intravenous Every 24 hours 03/03/21 1647 03/03/21 1716   03/03/21 2000  cefTRIAXone (ROCEPHIN) 2 g in sodium chloride 0.9 % 100 mL IVPB  Status:  Discontinued        2 g 200 mL/hr over 30 Minutes Intravenous Every 24 hours 03/03/21 1647 03/03/21 1716   03/03/21 1815  piperacillin-tazobactam (ZOSYN) IVPB 3.375 g        3.375 g 12.5 mL/hr over 240 Minutes Intravenous Every 8 hours 03/03/21 1722     03/03/21 1330  vancomycin (VANCOREADY) IVPB 2000 mg/400 mL        2,000 mg 200 mL/hr over 120 Minutes Intravenous  Once 03/03/21 1235 03/03/21 1540   03/03/21 1230  ceFEPIme (MAXIPIME) 2 g in sodium chloride 0.9 % 100 mL IVPB        2 g 200 mL/hr over 30 Minutes Intravenous  Once 03/03/21 1221 03/03/21 1345   03/03/21 1230  vancomycin (VANCOCIN) IVPB 1000 mg/200 mL premix  Status:  Discontinued         1,000 mg 200 mL/hr over 60 Minutes Intravenous  Once 03/03/21 1221 03/03/21 1235       Assessment/Plan: Impression: Partial small bowel obstruction most likely secondary to adhesive disease.  Patient has had multiple bowel movements, but still has high NG tube output.  We will get small bowel obstruction protocol study to assess.  Further management is pending those results.  Discussed with Dr. Wynetta Emery.  LOS: 2 days    Aviva Signs 03/05/2021

## 2021-03-05 NOTE — Progress Notes (Signed)
Patient removed IV , noted to have catheter with tape lying on the floor,. Notified AC .

## 2021-03-06 ENCOUNTER — Telehealth: Payer: Self-pay | Admitting: Emergency Medicine

## 2021-03-06 LAB — BASIC METABOLIC PANEL
Anion gap: 11 (ref 5–15)
BUN: 29 mg/dL — ABNORMAL HIGH (ref 8–23)
CO2: 24 mmol/L (ref 22–32)
Calcium: 9.3 mg/dL (ref 8.9–10.3)
Chloride: 121 mmol/L — ABNORMAL HIGH (ref 98–111)
Creatinine, Ser: 0.67 mg/dL (ref 0.61–1.24)
GFR, Estimated: >60 mL/min (ref >60)
Glucose, Bld: 71 mg/dL (ref 70–99)
Potassium: 3.5 mmol/L (ref 3.5–5.1)
Sodium: 156 mmol/L — ABNORMAL HIGH (ref 135–145)

## 2021-03-06 LAB — MAGNESIUM: Magnesium: 2.4 mg/dL (ref 1.7–2.4)

## 2021-03-06 LAB — GLUCOSE, CAPILLARY
Glucose-Capillary: 91 mg/dL (ref 70–99)
Glucose-Capillary: 95 mg/dL (ref 70–99)
Glucose-Capillary: 118 mg/dL — ABNORMAL HIGH (ref 70–99)
Glucose-Capillary: 74 mg/dL (ref 70–99)
Glucose-Capillary: 86 mg/dL (ref 70–99)
Glucose-Capillary: 111 mg/dL — ABNORMAL HIGH (ref 70–99)

## 2021-03-06 MED ORDER — ACETAMINOPHEN 160 MG/5ML PO SOLN
650.0000 mg | Freq: Four times a day (QID) | ORAL | Status: DC | PRN
  Administered 2021-03-07: 650 mg via ORAL
  Filled 2021-03-06: qty 20.3

## 2021-03-06 MED ORDER — DEXTROSE 5 % IV SOLN: INTRAVENOUS | Status: DC

## 2021-03-06 MED ORDER — POLYETHYLENE GLYCOL 3350 17 G PO PACK
17.0000 g | PACK | Freq: Every day | ORAL | Status: DC
  Administered 2021-03-06 – 2021-03-07 (×2): 17 g via ORAL
  Filled 2021-03-06 (×4): qty 1

## 2021-03-06 MED ORDER — POTASSIUM CHLORIDE CRYS ER 20 MEQ PO TBCR
40.0000 meq | EXTENDED_RELEASE_TABLET | Freq: Once | ORAL | Status: AC
  Administered 2021-03-06: 40 meq via ORAL
  Filled 2021-03-06: qty 2

## 2021-03-06 MED ORDER — KETOROLAC TROMETHAMINE 15 MG/ML IJ SOLN
15.0000 mg | Freq: Three times a day (TID) | INTRAMUSCULAR | Status: DC | PRN
  Administered 2021-03-06 – 2021-03-07 (×3): 15 mg via INTRAVENOUS
  Filled 2021-03-06 (×3): qty 1

## 2021-03-06 NOTE — Progress Notes (Signed)
Subjective: Patient pulled out his NG tube.  Was started on full liquid diet last night and is tolerating it well.  Objective: Vital signs in last 24 hours: Temp:  [97.6 F (36.4 C)-98.4 F (36.9 C)] 98.4 F (36.9 C) (06/10 0437) Pulse Rate:  [74-93] 93 (06/10 0437) Resp:  [17-18] 18 (06/10 0437) BP: (110-128)/(47-62) 110/50 (06/10 0437) SpO2:  [91 %-100 %] 91 % (06/10 0437)    Intake/Output from previous day: 06/09 0701 - 06/10 0700 In: 511.7 [I.V.:370; IV Piggyback:141.7] Out: 500 [Urine:500] Intake/Output this shift: No intake/output data recorded.  General appearance: alert, cooperative, and no distress GI: soft, non-tender; bowel sounds normal; no masses,  no organomegaly  Lab Results:  Recent Labs    03/03/21 1214 03/04/21 0419  WBC 13.3* 9.4  HGB 15.2 12.2*  HCT 47.5 38.8*  PLT 271 180   BMET Recent Labs    03/05/21 0433 03/06/21 0501  NA 151* 156*  K 3.9 3.5  CL 119* 121*  CO2 24 24  GLUCOSE 80 71  BUN 44* 29*  CREATININE 0.85 0.67  CALCIUM 9.2 9.3   PT/INR Recent Labs    03/03/21 1239 03/04/21 0419  LABPROT 15.3* 16.8*  INR 1.2 1.4*    Studies/Results: DG Abd Portable 1V-Small Bowel Obstruction Protocol-initial, 8 hr delay  Result Date: 03/05/2021 CLINICAL DATA:  Contrast administered at 0850 hours by history. Delayed film at 1700 hours. EXAM: PORTABLE ABDOMEN - 1 VIEW COMPARISON:  03/01/2021 FINDINGS: Contrast is present within the fundus of the stomach. I do not see that any has passed into the small intestine or colon. There is mild gaseous distension of the stomach, but the other bowel loops appear within normal limits. IMPRESSION: Previously administered contrast appears to remain in the fundus of the stomach, without any visible egress into the small bowel or colon. Electronically Signed   By: Nelson Chimes M.D.   On: 03/05/2021 19:08    Anti-infectives: Anti-infectives (From admission, onward)    Start     Dose/Rate Route Frequency  Ordered Stop   03/04/21 0200  vancomycin (VANCOCIN) IVPB 1000 mg/200 mL premix  Status:  Discontinued        1,000 mg 200 mL/hr over 60 Minutes Intravenous Every 12 hours 03/03/21 1435 03/03/21 1655   03/03/21 2100  ceFEPIme (MAXIPIME) 2 g in sodium chloride 0.9 % 100 mL IVPB  Status:  Discontinued        2 g 200 mL/hr over 30 Minutes Intravenous Every 8 hours 03/03/21 1435 03/03/21 1655   03/03/21 2030  azithromycin (ZITHROMAX) 500 mg in sodium chloride 0.9 % 250 mL IVPB  Status:  Discontinued        500 mg 250 mL/hr over 60 Minutes Intravenous Every 24 hours 03/03/21 1647 03/03/21 1716   03/03/21 2000  cefTRIAXone (ROCEPHIN) 2 g in sodium chloride 0.9 % 100 mL IVPB  Status:  Discontinued        2 g 200 mL/hr over 30 Minutes Intravenous Every 24 hours 03/03/21 1647 03/03/21 1716   03/03/21 1815  piperacillin-tazobactam (ZOSYN) IVPB 3.375 g        3.375 g 12.5 mL/hr over 240 Minutes Intravenous Every 8 hours 03/03/21 1722     03/03/21 1330  vancomycin (VANCOREADY) IVPB 2000 mg/400 mL        2,000 mg 200 mL/hr over 120 Minutes Intravenous  Once 03/03/21 1235 03/03/21 1540   03/03/21 1230  ceFEPIme (MAXIPIME) 2 g in sodium chloride 0.9 % 100 mL IVPB  2 g 200 mL/hr over 30 Minutes Intravenous  Once 03/03/21 1221 03/03/21 1345   03/03/21 1230  vancomycin (VANCOCIN) IVPB 1000 mg/200 mL premix  Status:  Discontinued        1,000 mg 200 mL/hr over 60 Minutes Intravenous  Once 03/03/21 1221 03/03/21 1235       Assessment/Plan: Impression: Partial small bowel obstruction, resolving.  Suspect small bowel protocol series findings consistent with an element of gastroparesis as the small bowel was not dilated.  Discussed with Dr. Wynetta Emery.  May advance diet as tolerated.  We will add MiraLAX.  LOS: 3 days    Aviva Signs 03/06/2021

## 2021-03-06 NOTE — Progress Notes (Cosign Needed)
Patient laying in bed comfortably responds to commands denies pain or discomfort. Needs reorienting often. Right hand is contracture more then left. Oriented to self. Bowl sounds herd in all 4 quadrants.

## 2021-03-06 NOTE — Plan of Care (Signed)

## 2021-03-06 NOTE — Care Management Important Message (Signed)
Important Message  Patient Details  Name: [name] MRN: [id number] Date of Birth: Jun 20, 1946   Medicare Important Message Given:  Yes     Tommy Medal 03/06/2021, 9:22 AM

## 2021-03-06 NOTE — Progress Notes (Signed)
PROGRESS NOTE   [name]  IRJ:[id number] DOB: [birth year] DOA: 03/03/2021 PCP: Caprice Renshaw, MD   Chief Complaint  Patient presents with   Fatigue   Level of care: Med-Surg  Brief Admission History:  75 year old gentleman long-term resident at [facility] presented to the emergency department with hypoxia.  He chronically wears 3 L of oxygen.  He was noted to have sepsis UTI and bilateral lower lobe infiltrates and started on treatment for aspiration pneumonia and pyuria.  He also was noted to have acute urinary retention and had a Foley catheter placed.  In addition he was noted to have a probable partial small bowel obstruction and surgery was consulted.     Assessment & Plan:   Principal Problem:   Sepsis (Assumption) Active Problems:   Essential hypertension   Dementia without behavioral disturbance (HCC)   Lactic acidosis   Aspiration pneumonia of both lower lobes due to gastric secretions (HCC)   SBO (small bowel obstruction) (HCC)   Acute lower UTI   Sepsis - RESOLVED. secondary to UTI and aspiration pneumonia -his sepsis physiology has resolved with aggressive treatments continue IV antibiotics and current orders.  Lactic acid has normalized.  He is clinically improving.   Partial small bowel obstruction versus ileus-appreciate surgery consultation continue NG tube and supportive measures.  Possibly will need a small bowel follow-through study.  He remains a very poor candidate for surgical intervention.  Dulcolax suppositories started by surgery.  Follow-up CEA level ordered by surgery.  Continue NG suction.  Surgery planning small bowel follow through study today.    Lactic acidosis -resolved.  Klebsiella UTI- treated with IV zosyn which according to culture and sensitivity should adequately treat infection.  Acute urinary retention-treated with Foley placement.  Foley DC'd on 6/8.  He passed a voiding trial.  Type 2 diabetes mellitus-continue SSI coverage and CBG  monitoring. CBG (last 3)  Recent Labs    03/06/21 0742 03/06/21 1107 03/06/21 1613  GLUCAP 95 118* 111*    Dementia-stable on home medications  Hypokalemia - repleted.   Hypernatremia - change to D5W infusion, recheck BMP in AM.    DVT prophylaxis: Enoxaparin Code Status: Full Family Communication:  Disposition: Return to SNF when medically stable Status is: Inpatient  Remains inpatient appropriate because:IV treatments appropriate due to intensity of illness or inability to take PO and Inpatient level of care appropriate due to severity of illness  Dispo: The patient is from: SNF              Anticipated d/c is to: SNF              Patient currently is not medically stable to d/c.   Difficult to place patient No   Consultants:  Surgery   Procedures:    Antimicrobials:  Zosyn 6/7>>   Subjective: Had large BM yesterday.  No specific complaints.    Objective: Vitals:   03/05/21 1351 03/05/21 2130 03/06/21 0437 03/06/21 1358  BP: (!) 128/47 119/62 (!) 110/50 118/62  Pulse: 74 83 93 86  Resp: 17 18 18 18   Temp: 97.6 F (36.4 C) 98.2 F (36.8 C) 98.4 F (36.9 C) 98.7 F (37.1 C)  TempSrc: Oral   Oral  SpO2: 100% 91% 91% 93%  Weight:      Height:        Intake/Output Summary (Last 24 hours) at 03/06/2021 1615 Last data filed at 03/06/2021 1500 Gross per 24 hour  Intake 1178.44 ml  Output 500 ml  Net 678.44 ml   Filed Weights   03/03/21 1150  Weight: 105 kg    Examination:  General exam: awake,alert, Appears calm and comfortable. NG in place.  Respiratory system: BBS clear to ausc, shallow breathing, no rales heard. Cardiovascular system: normal S1 & S2 heard. No JVD, murmurs, rubs, gallops or clicks. No pedal edema. Gastrointestinal system: Abdomen is nondistended, soft and nontender. No organomegaly or masses felt. Normal bowel sounds minimal. Central nervous system: Alert and oriented to person. No focal neurological deficits. Extremities:  Symmetric 5 x 5 power. Skin: No rashes, lesions or ulcers Psychiatry: Judgement and insight appear poor. Mood & affect appropriate.   Data Reviewed: I have personally reviewed following labs and imaging studies  CBC: Recent Labs  Lab 03/01/21 2238 03/03/21 1214 03/04/21 0419  WBC 9.4 13.3* 9.4  NEUTROABS  --  10.7*  --   HGB 13.4 15.2 12.2*  HCT 41.1 47.5 38.8*  MCV 92.8 93.3 96.0  PLT 190 271 517    Basic Metabolic Panel: Recent Labs  Lab 03/01/21 2238 03/03/21 1214 03/04/21 0419 03/05/21 0433 03/06/21 0501  NA 138 144 145 151* 156*  K 4.9 4.1 3.3* 3.9 3.5  CL 100 107 113* 119* 121*  CO2 26 22 22 24 24   GLUCOSE 135* 164* 102* 80 71  BUN 50* 67* 56* 44* 29*  CREATININE 1.41* 1.26* 0.92 0.85 0.67  CALCIUM 9.0 9.4 8.8* 9.2 9.3  MG  --   --   --  2.5* 2.4    GFR: Estimated Creatinine Clearance: 98.4 mL/min (by C-G formula based on SCr of 0.67 mg/dL).  Liver Function Tests: Recent Labs  Lab 03/01/21 2238 03/03/21 1214  AST 23 17  ALT 18 17  ALKPHOS 84 77  BILITOT 1.5* 1.3*  PROT 7.2 7.5  ALBUMIN 3.4* 3.4*    CBG: Recent Labs  Lab 03/06/21 0016 03/06/21 0343 03/06/21 0742 03/06/21 1107 03/06/21 1613  GLUCAP 86 91 95 118* 111*    Recent Results (from the past 240 hour(s))  Urine culture     Status: Abnormal   Collection Time: 03/02/21  1:28 AM   Specimen: Urine, Catheterized  Result Value Ref Range Status   Specimen Description   Final    URINE, CATHETERIZED Performed at [facility], [address] [place], [place] [address]    Special Requests   Final    NONE Performed at [facility], [address] [place], [place] [address]    Culture >=100,000 COLONIES/mL KLEBSIELLA PNEUMONIAE (A)  Final   Report Status 03/05/2021 FINAL  Final   Organism ID, Bacteria KLEBSIELLA PNEUMONIAE (A)  Final      Susceptibility   Klebsiella pneumoniae - MIC*    AMPICILLIN RESISTANT Resistant     CEFAZOLIN <=4 SENSITIVE Sensitive     CEFEPIME <=0.12  SENSITIVE Sensitive     CEFTRIAXONE <=0.25 SENSITIVE Sensitive     CIPROFLOXACIN <=0.25 SENSITIVE Sensitive     GENTAMICIN <=1 SENSITIVE Sensitive     IMIPENEM <=0.25 SENSITIVE Sensitive     NITROFURANTOIN <=16 SENSITIVE Sensitive     TRIMETH/SULFA <=20 SENSITIVE Sensitive     AMPICILLIN/SULBACTAM 4 SENSITIVE Sensitive     PIP/TAZO <=4 SENSITIVE Sensitive     * >=100,000 COLONIES/mL KLEBSIELLA PNEUMONIAE  Culture, blood (Routine x 2)     Status: None (Preliminary result)   Collection Time: 03/03/21 12:14 PM   Specimen: BLOOD RIGHT ARM  Result Value Ref Range Status   Specimen Description BLOOD RIGHT ARM  Final   Special Requests   Final    BOTTLES DRAWN AEROBIC AND ANAEROBIC Blood Culture results may not be optimal due to an inadequate volume of blood received in culture bottles   Culture   Final    NO GROWTH 3 DAYS Performed at [facility], [address], [place], [place] [address]    Report Status PENDING  Incomplete  Culture, blood (Routine x 2)     Status: None (Preliminary result)   Collection Time: 03/03/21 12:14 PM   Specimen: BLOOD  Result Value Ref Range Status   Specimen Description BLOOD LEFT ANTECUBITAL  Final   Special Requests   Final    BOTTLES DRAWN AEROBIC AND ANAEROBIC Blood Culture results may not be optimal due to an inadequate volume of blood received in culture bottles   Culture   Final    NO GROWTH 3 DAYS Performed at [facility], [address] [place], [place] [address]    Report Status PENDING  Incomplete  Resp Panel by RT-PCR (Flu A&B, Covid) Nasopharyngeal Swab     Status: None   Collection Time: 03/03/21 12:53 PM   Specimen: Nasopharyngeal Swab; Nasopharyngeal(NP) swabs in vial transport medium  Result Value Ref Range Status   SARS Coronavirus 2 by RT PCR NEGATIVE NEGATIVE Final    Comment: (NOTE) SARS-CoV-2 target nucleic acids are NOT DETECTED.  The SARS-CoV-2 RNA is generally detectable in upper respiratory specimens during the acute phase  of infection. The lowest concentration of SARS-CoV-2 viral copies this assay can detect is 138 copies/mL. A negative result does not preclude SARS-Cov-2 infection and should not be used as the sole basis for treatment or other patient management decisions. A negative result may occur with  improper specimen collection/handling, submission of specimen other than nasopharyngeal swab, presence of viral mutation(s) within the areas targeted by this assay, and inadequate number of viral copies(<138 copies/mL). A negative result must be combined with clinical observations, patient history, and epidemiological information. The expected result is Negative.  Fact Sheet for Patients:  EntrepreneurPulse.com.au  Fact Sheet for Healthcare Providers:  IncredibleEmployment.be  This test is no t yet approved or cleared by the [place] FDA and  has been authorized for detection and/or diagnosis of SARS-CoV-2 by FDA under an Emergency Use Authorization (EUA). This EUA will remain  in effect (meaning this test can be used) for the duration of the COVID-19 declaration under Section 564(b)(1) of the Act, 21 U.S.C.section 360bbb-3(b)(1), unless the authorization is terminated  or revoked sooner.       Influenza A by PCR NEGATIVE NEGATIVE Final   Influenza B by PCR NEGATIVE NEGATIVE Final    Comment: (NOTE) The Xpert Xpress SARS-CoV-2/FLU/RSV plus assay is intended as an aid in the diagnosis of influenza from Nasopharyngeal swab specimens and should not be used as a sole basis for treatment. Nasal washings and aspirates are unacceptable for Xpert Xpress SARS-CoV-2/FLU/RSV testing.  Fact Sheet for Patients: EntrepreneurPulse.com.au  Fact Sheet for Healthcare Providers: IncredibleEmployment.be  This test is not yet approved or cleared by the [place] FDA and has been authorized for detection and/or diagnosis of  SARS-CoV-2 by FDA under an Emergency Use Authorization (EUA). This EUA will remain in effect (meaning this test can be used) for the duration of the COVID-19 declaration under Section 564(b)(1) of the Act, 21 U.S.C. section 360bbb-3(b)(1), unless the authorization is terminated or revoked.  Performed at [facility], [address] [place],  [address]   MRSA PCR Screening  Status: None   Collection Time: 03/03/21  6:15 PM   Specimen: Nasopharyngeal  Result Value Ref Range Status   MRSA by PCR NEGATIVE NEGATIVE Final    Comment:        The GeneXpert MRSA Assay (FDA approved for NASAL specimens only), is one component of a comprehensive MRSA colonization surveillance program. It is not intended to diagnose MRSA infection nor to guide or monitor treatment for MRSA infections. Performed at [facility], [address], [place], [place] [address]      Radiology Studies: DG Abd Portable 1V-Small Bowel Obstruction Protocol-initial, 8 hr delay  Result Date: 03/05/2021 CLINICAL DATA:  Contrast administered at 0850 hours by history. Delayed film at 1700 hours. EXAM: PORTABLE ABDOMEN - 1 VIEW COMPARISON:  03/01/2021 FINDINGS: Contrast is present within the fundus of the stomach. I do not see that any has passed into the small intestine or colon. There is mild gaseous distension of the stomach, but the other bowel loops appear within normal limits. IMPRESSION: Previously administered contrast appears to remain in the fundus of the stomach, without any visible egress into the small bowel or colon. Electronically Signed   By: Nelson Chimes M.D.   On: 03/05/2021 19:08     Scheduled Meds:  bisacodyl  10 mg Rectal BID   Chlorhexidine Gluconate Cloth  6 each Topical Daily   cycloSPORINE  1 drop Both Eyes BID   enoxaparin (LOVENOX) injection  40 mg Subcutaneous Q24H   pantoprazole (PROTONIX) IV  40 mg Intravenous Q24H   polyethylene glycol  17 g Oral Daily   Continuous Infusions:   dextrose 100 mL/hr at 03/06/21 1254   piperacillin-tazobactam (ZOSYN)  IV 3.375 g (03/06/21 1008)     LOS: 3 days   Time spent: 35 mins  Nyrie Sigal Wynetta Emery, MD How to contact the [facility] Attending or Consulting provider [place] or covering provider during after hours [place], for this patient?  Check the care team in [facility] and look for a) attending/consulting [facility] provider listed and b) the [facility] team listed Log into [email or web address] and use [place]'s universal password to access. If you do not have the password, please contact the hospital operator. Locate the [facility] provider you are looking for under [facility] and page to a number that you can be directly reached. If you still have difficulty reaching the provider, please page the [facility] (Director on Call) for the Hospitalists listed on amion for assistance.  03/06/2021, 4:15 PM

## 2021-03-07 ENCOUNTER — Inpatient Hospital Stay (HOSPITAL_COMMUNITY): Payer: Medicare (Managed Care)

## 2021-03-07 LAB — BASIC METABOLIC PANEL
Anion gap: 8 (ref 5–15)
BUN: 19 mg/dL (ref 8–23)
CO2: 28 mmol/L (ref 22–32)
Calcium: 9.1 mg/dL (ref 8.9–10.3)
Chloride: 111 mmol/L (ref 98–111)
Creatinine, Ser: 0.7 mg/dL (ref 0.61–1.24)
GFR, Estimated: >60 mL/min (ref >60)
Glucose, Bld: 164 mg/dL — ABNORMAL HIGH (ref 70–99)
Potassium: 3.2 mmol/L — ABNORMAL LOW (ref 3.5–5.1)
Sodium: 147 mmol/L — ABNORMAL HIGH (ref 135–145)

## 2021-03-07 LAB — GLUCOSE, CAPILLARY
Glucose-Capillary: 137 mg/dL — ABNORMAL HIGH (ref 70–99)
Glucose-Capillary: 139 mg/dL — ABNORMAL HIGH (ref 70–99)
Glucose-Capillary: 142 mg/dL — ABNORMAL HIGH (ref 70–99)
Glucose-Capillary: 143 mg/dL — ABNORMAL HIGH (ref 70–99)
Glucose-Capillary: 150 mg/dL — ABNORMAL HIGH (ref 70–99)
Glucose-Capillary: 157 mg/dL — ABNORMAL HIGH (ref 70–99)

## 2021-03-07 MED ORDER — METOCLOPRAMIDE HCL 10 MG PO TABS
5.0000 mg | ORAL_TABLET | Freq: Three times a day (TID) | ORAL | Status: DC
  Administered 2021-03-07 – 2021-03-08 (×4): 5 mg via ORAL
  Filled 2021-03-07 (×6): qty 1

## 2021-03-07 MED ORDER — POTASSIUM CHLORIDE 10 MEQ/100ML IV SOLN
10.0000 meq | INTRAVENOUS | Status: AC
  Administered 2021-03-07 (×4): 10 meq via INTRAVENOUS
  Filled 2021-03-07 (×4): qty 100

## 2021-03-07 NOTE — Progress Notes (Signed)
PROGRESS NOTE   [name]  CVE:[id number] DOB: Mar 13, 1946 DOA: 03/03/2021 PCP: Caprice Renshaw, MD   Chief Complaint  Patient presents with   Fatigue   Level of care: Med-Surg  Brief Admission History:  75 year old gentleman long-term resident at [facility] presented to the emergency department with hypoxia.  He chronically wears 3 L of oxygen.  He was noted to have sepsis UTI and bilateral lower lobe infiltrates and started on treatment for aspiration pneumonia and pyuria.  He also was noted to have acute urinary retention and had a Foley catheter placed.  In addition he was noted to have a probable partial small bowel obstruction and surgery was consulted.     Assessment & Plan:   Principal Problem:   Sepsis ([place]) Active Problems:   Essential hypertension   Dementia without behavioral disturbance (HCC)   Lactic acidosis   Aspiration pneumonia of both lower lobes due to gastric secretions (HCC)   SBO (small bowel obstruction) (HCC)   Acute lower UTI   Sepsis - RESOLVED. secondary to UTI and aspiration pneumonia -his sepsis physiology has resolved with aggressive treatments continue IV antibiotics and current orders.  Lactic acid has normalized.  He is clinically improving. Complete antibiotics after 5 days.    Gastroparesis versus ileus-appreciate surgery consultation.  Abd Xray today with large amount of gastric distension.  Surgery requested that I start metoclopramide which was started today at noon with meals.  Hopefully this will help and he will have another BM soon.   Lactic acidosis -resolved.  Klebsiella UTI- treated with IV zosyn which according to culture and sensitivity should adequately treat infection.  Acute urinary retention-treated with Foley placement.  Foley DC'd on 6/8.  He passed a voiding trial.  Type 2 diabetes mellitus-continue SSI coverage and CBG monitoring. CBG (last 3)  Recent Labs    03/07/21 0417 03/07/21 0740 03/07/21 1114  GLUCAP 142* 150*  157*   Dementia-stable on home medications  Hypokalemia - repleted.   Hypernatremia - IMPROVING. . . continue D5W infusion, recheck BMP in AM.    DVT prophylaxis: Enoxaparin Code Status: Full Family Communication:  Disposition: Return to SNF when medically stable Status is: Inpatient  Remains inpatient appropriate because:IV treatments appropriate due to intensity of illness or inability to take PO and Inpatient level of care appropriate due to severity of illness  Dispo: The patient is from: SNF              Anticipated d/c is to: SNF              Patient currently is not medically stable to d/c.   Difficult to place patient No   Consultants:  Surgery   Procedures:    Antimicrobials:  Zosyn 6/7>>   Subjective: Pt reports he doesn't feel like eating anything.  No bowel movement.     Objective: Vitals:   03/05/21 2130 03/06/21 0437 03/06/21 1358 03/07/21 0426  BP: 119/62 (!) 110/50 118/62 (!) 126/54  Pulse: 83 93 86 91  Resp: 18 18 18  (!) 22  Temp: 98.2 F (36.8 C) 98.4 F (36.9 C) 98.7 F (37.1 C) 97.8 F (36.6 C)  TempSrc:   Oral Oral  SpO2: 91% 91% 93% 95%  Weight:      Height:        Intake/Output Summary (Last 24 hours) at 03/07/2021 1410 Last data filed at 03/07/2021 1233 Gross per 24 hour  Intake 3474.98 ml  Output 750 ml  Net 2724.98 ml  Filed Weights   03/03/21 1150  Weight: 105 kg    Examination:  General exam: awake,alert, Appears calm and comfortable.  Respiratory system: BBS clear to ausc, shallow breathing, no rales heard. Cardiovascular system: normal S1 & S2 heard. No JVD, murmurs, rubs, gallops or clicks. No pedal edema. Gastrointestinal system: Abdomen is nondistended, soft and nontender. No organomegaly or masses felt. Normal bowel sounds. Central nervous system: Alert and oriented to person. No focal neurological deficits. Extremities: Symmetric 5 x 5 power. Skin: No rashes, lesions or ulcers Psychiatry: Judgement and insight  appear poor. Mood & affect appropriate.   Data Reviewed: I have personally reviewed following labs and imaging studies  CBC: Recent Labs  Lab 03/01/21 2238 03/03/21 1214 03/04/21 0419  WBC 9.4 13.3* 9.4  NEUTROABS  --  10.7*  --   HGB 13.4 15.2 12.2*  HCT 41.1 47.5 38.8*  MCV 92.8 93.3 96.0  PLT 190 271 944    Basic Metabolic Panel: Recent Labs  Lab 03/03/21 1214 03/04/21 0419 03/05/21 0433 03/06/21 0501 03/07/21 0607  NA 144 145 151* 156* 147*  K 4.1 3.3* 3.9 3.5 3.2*  CL 107 113* 119* 121* 111  CO2 22 22 24 24 28   GLUCOSE 164* 102* 80 71 164*  BUN 67* 56* 44* 29* 19  CREATININE 1.26* 0.92 0.85 0.67 0.70  CALCIUM 9.4 8.8* 9.2 9.3 9.1  MG  --   --  2.5* 2.4  --     GFR: Estimated Creatinine Clearance: 98.4 mL/min (by C-G formula based on SCr of 0.7 mg/dL).  Liver Function Tests: Recent Labs  Lab 03/01/21 2238 03/03/21 1214  AST 23 17  ALT 18 17  ALKPHOS 84 77  BILITOT 1.5* 1.3*  PROT 7.2 7.5  ALBUMIN 3.4* 3.4*    CBG: Recent Labs  Lab 03/06/21 1613 03/07/21 0004 03/07/21 0417 03/07/21 0740 03/07/21 1114  GLUCAP 111* 143* 142* 150* 157*    Recent Results (from the past 240 hour(s))  Urine culture     Status: Abnormal   Collection Time: 03/02/21  1:28 AM   Specimen: Urine, Catheterized  Result Value Ref Range Status   Specimen Description   Final    URINE, CATHETERIZED Performed at [facility], [address] [place], [place] [address]    Special Requests   Final    NONE Performed at [facility], [address] [place], [place] [address]    Culture >=100,000 COLONIES/mL KLEBSIELLA PNEUMONIAE (A)  Final   Report Status 03/05/2021 FINAL  Final   Organism ID, Bacteria KLEBSIELLA PNEUMONIAE (A)  Final      Susceptibility   Klebsiella pneumoniae - MIC*    AMPICILLIN RESISTANT Resistant     CEFAZOLIN <=4 SENSITIVE Sensitive     CEFEPIME <=0.12 SENSITIVE Sensitive     CEFTRIAXONE <=0.25 SENSITIVE Sensitive     CIPROFLOXACIN <=0.25  SENSITIVE Sensitive     GENTAMICIN <=1 SENSITIVE Sensitive     IMIPENEM <=0.25 SENSITIVE Sensitive     NITROFURANTOIN <=16 SENSITIVE Sensitive     TRIMETH/SULFA <=20 SENSITIVE Sensitive     AMPICILLIN/SULBACTAM 4 SENSITIVE Sensitive     PIP/TAZO <=4 SENSITIVE Sensitive     * >=100,000 COLONIES/mL KLEBSIELLA PNEUMONIAE  Culture, blood (Routine x 2)     Status: None (Preliminary result)   Collection Time: 03/03/21 12:14 PM   Specimen: BLOOD RIGHT ARM  Result Value Ref Range Status   Specimen Description BLOOD RIGHT ARM  Final   Special Requests   Final  BOTTLES DRAWN AEROBIC AND ANAEROBIC Blood Culture results may not be optimal due to an inadequate volume of blood received in culture bottles   Culture   Final    NO GROWTH 4 DAYS Performed at [facility], [address], [place], [place] [address]    Report Status PENDING  Incomplete  Culture, blood (Routine x 2)     Status: None (Preliminary result)   Collection Time: 03/03/21 12:14 PM   Specimen: BLOOD  Result Value Ref Range Status   Specimen Description BLOOD LEFT ANTECUBITAL  Final   Special Requests   Final    BOTTLES DRAWN AEROBIC AND ANAEROBIC Blood Culture results may not be optimal due to an inadequate volume of blood received in culture bottles   Culture   Final    NO GROWTH 4 DAYS Performed at [facility], [address], [place], [place] [address]    Report Status PENDING  Incomplete  Resp Panel by RT-PCR (Flu A&B, Covid) Nasopharyngeal Swab     Status: None   Collection Time: 03/03/21 12:53 PM   Specimen: Nasopharyngeal Swab; Nasopharyngeal(NP) swabs in vial transport medium  Result Value Ref Range Status   SARS Coronavirus 2 by RT PCR NEGATIVE NEGATIVE Final    Comment: (NOTE) SARS-CoV-2 target nucleic acids are NOT DETECTED.  The SARS-CoV-2 RNA is generally detectable in upper respiratory specimens during the acute phase of infection. The lowest concentration of SARS-CoV-2 viral copies this assay can detect  is 138 copies/mL. A negative result does not preclude SARS-Cov-2 infection and should not be used as the sole basis for treatment or other patient management decisions. A negative result may occur with  improper specimen collection/handling, submission of specimen other than nasopharyngeal swab, presence of viral mutation(s) within the areas targeted by this assay, and inadequate number of viral copies(<138 copies/mL). A negative result must be combined with clinical observations, patient history, and epidemiological information. The expected result is Negative.  Fact Sheet for Patients:  EntrepreneurPulse.com.au  Fact Sheet for Healthcare Providers:  IncredibleEmployment.be  This test is no t yet approved or cleared by the [place] FDA and  has been authorized for detection and/or diagnosis of SARS-CoV-2 by FDA under an Emergency Use Authorization (EUA). This EUA will remain  in effect (meaning this test can be used) for the duration of the COVID-19 declaration under Section 564(b)(1) of the Act, 21 U.S.C.section 360bbb-3(b)(1), unless the authorization is terminated  or revoked sooner.       Influenza A by PCR NEGATIVE NEGATIVE Final   Influenza B by PCR NEGATIVE NEGATIVE Final    Comment: (NOTE) The Xpert Xpress SARS-CoV-2/FLU/RSV plus assay is intended as an aid in the diagnosis of influenza from Nasopharyngeal swab specimens and should not be used as a sole basis for treatment. Nasal washings and aspirates are unacceptable for Xpert Xpress SARS-CoV-2/FLU/RSV testing.  Fact Sheet for Patients: EntrepreneurPulse.com.au  Fact Sheet for Healthcare Providers: IncredibleEmployment.be  This test is not yet approved or cleared by the [place] FDA and has been authorized for detection and/or diagnosis of SARS-CoV-2 by FDA under an Emergency Use Authorization (EUA). This EUA will remain in effect  (meaning this test can be used) for the duration of the COVID-19 declaration under Section 564(b)(1) of the Act, 21 U.S.C. section 360bbb-3(b)(1), unless the authorization is terminated or revoked.  Performed at [facility], [address] [place], [place] [address]   MRSA PCR Screening     Status: None   Collection Time: 03/03/21  6:15 PM   Specimen: Nasopharyngeal  Result Value Ref Range Status   MRSA by PCR NEGATIVE NEGATIVE Final    Comment:        The GeneXpert MRSA Assay (FDA approved for NASAL specimens only), is one component of a comprehensive MRSA colonization surveillance program. It is not intended to diagnose MRSA infection nor to guide or monitor treatment for MRSA infections. Performed at [facility], [address] [place], [place] [address]      Radiology Studies: DG Abd Portable 1V-Small Bowel Obstruction Protocol-initial, 8 hr delay  Result Date: 03/05/2021 CLINICAL DATA:  Contrast administered at 0850 hours by history. Delayed film at 1700 hours. EXAM: PORTABLE ABDOMEN - 1 VIEW COMPARISON:  03/01/2021 FINDINGS: Contrast is present within the fundus of the stomach. I do not see that any has passed into the small intestine or colon. There is mild gaseous distension of the stomach, but the other bowel loops appear within normal limits. IMPRESSION: Previously administered contrast appears to remain in the fundus of the stomach, without any visible egress into the small bowel or colon. Electronically Signed   By: Nelson Chimes M.D.   On: 03/05/2021 19:08   DG Abd Portable 2V  Result Date: 03/07/2021 CLINICAL DATA:  Ileus EXAM: PORTABLE ABDOMEN - 2 VIEW COMPARISON:  03/05/2021 FINDINGS: Marked gaseous distension of the stomach. Gas distended colon. A few small nondilated gas-filled small bowel loops are visible in the right lower quadrant. No acute osseous abnormality. IMPRESSION: Marked gaseous distension of the stomach, patient may benefit from nasogastric tube  placement. Contrast material has passed into the colon since the prior exam. A few nondilated small bowel loops are visible. Electronically Signed   By: Maurine Simmering   On: 03/07/2021 08:53     Scheduled Meds:  bisacodyl  10 mg Rectal BID   Chlorhexidine Gluconate Cloth  6 each Topical Daily   cycloSPORINE  1 drop Both Eyes BID   enoxaparin (LOVENOX) injection  40 mg Subcutaneous Q24H   metoCLOPramide  5 mg Oral TID AC & HS   pantoprazole (PROTONIX) IV  40 mg Intravenous Q24H   polyethylene glycol  17 g Oral Daily   Continuous Infusions:  dextrose 75 mL/hr at 03/07/21 0924   piperacillin-tazobactam (ZOSYN)  IV 3.375 g (03/07/21 0918)   potassium chloride 10 mEq (03/07/21 1350)     LOS: 4 days   Time spent: 35 mins  Nesanel Aguila Wynetta Emery, MD How to contact the [facility] Attending or Consulting provider [place] or covering provider during after hours [place], for this patient?  Check the care team in [facility] and look for a) attending/consulting [facility] provider listed and b) the [facility] team listed Log into [email or web address] and use [place] universal password to access. If you do not have the password, please contact the hospital operator. Locate the [facility] provider you are looking for under [facility] and page to a number that you can be directly reached. If you still have difficulty reaching the provider, please page the [facility] (Director on Call) for the Hospitalists listed on amion for assistance.  03/07/2021, 2:10 PM

## 2021-03-07 NOTE — Progress Notes (Signed)
Subjective:  CC: [name] is a 75 y.o. male  Hospital stay day 4,   gastroparesis vs ileus  HPI: No specific complaints or issues reported.  Pt passing flatus but no BM yet.    ROS:  General: Denies weight loss, weight gain, fatigue, fevers, chills, and night sweats. Heart: Denies chest pain, palpitations, racing heart, irregular heartbeat, leg pain or swelling, and decreased activity tolerance. Respiratory: Denies breathing difficulty, shortness of breath, wheezing, cough, and sputum. GI: Denies change in appetite, heartburn, nausea, vomiting, constipation, diarrhea, and blood in stool. GU: Denies difficulty urinating, pain with urinating, urgency, frequency, blood in urine.   Objective:   Temp:  [97.8 F (36.6 C)-98.7 F (37.1 C)] 97.8 F (36.6 C) (06/11 0426) Pulse Rate:  [86-91] 91 (06/11 0426) Resp:  [18-22] 22 (06/11 0426) BP: (118-126)/(54-62) 126/54 (06/11 0426) SpO2:  [93 %-95 %] 95 % (06/11 0426)     Height: 6' (182.9 cm) Weight: 105 kg BMI (Calculated): 31.39   Intake/Output this shift:   Intake/Output Summary (Last 24 hours) at 03/07/2021 1022 Last data filed at 03/07/2021 0900 Gross per 24 hour  Intake 3234.98 ml  Output 450 ml  Net 2784.98 ml    Constitutional :  alert, cooperative, appears stated age, and no distress  Respiratory:  clear to auscultation bilaterally  Cardiovascular:  regular rate and rhythm  Gastrointestinal: Soft, no guarding, some distention in stomach, no TTP .   Skin: Cool and moist.   Psychiatric: Normal affect, non-agitated, not confused       LABS:  CMP Latest Ref Rng & Units 03/07/2021 03/06/2021 03/05/2021  Glucose 70 - 99 mg/dL 164(H) 71 80  BUN 8 - 23 mg/dL 19 29(H) 44(H)  Creatinine 0.61 - 1.24 mg/dL 0.70 0.67 0.85  Sodium 135 - 145 mmol/L 147(H) 156(H) 151(H)  Potassium 3.5 - 5.1 mmol/L 3.2(L) 3.5 3.9  Chloride 98 - 111 mmol/L 111 121(H) 119(H)  CO2 22 - 32 mmol/L 28 24 24   Calcium 8.9 - 10.3 mg/dL 9.1 9.3 9.2  Total  Protein 6.5 - 8.1 g/dL - - -  Total Bilirubin 0.3 - 1.2 mg/dL - - -  Alkaline Phos 38 - 126 U/L - - -  AST 15 - 41 U/L - - -  ALT 0 - 44 U/L - - -   CBC Latest Ref Rng & Units 03/04/2021 03/03/2021 03/01/2021  WBC 4.0 - 10.5 K/uL 9.4 13.3(H) 9.4  Hemoglobin 13.0 - 17.0 g/dL 12.2(L) 15.2 13.4  Hematocrit 39.0 - 52.0 % 38.8(L) 47.5 41.1  Platelets 150 - 400 K/uL 180 271 190    RADS: CLINICAL DATA:  Ileus   EXAM: PORTABLE ABDOMEN - 2 VIEW   COMPARISON:  03/05/2021   FINDINGS: Marked gaseous distension of the stomach. Gas distended colon. A few small nondilated gas-filled small bowel loops are visible in the right lower quadrant. No acute osseous abnormality.   IMPRESSION: Marked gaseous distension of the stomach, patient may benefit from nasogastric tube placement.   Contrast material has passed into the colon since the prior exam. A few nondilated small bowel loops are visible.     Electronically Signed   By: Maurine Simmering   On: 03/07/2021 08:53 Assessment:   Gastroparesis.  Pt is passing flatus, contrast has migrated to colon on most recent imaging, but still had distended stomach without any other obvious bowel dilation.  Discussed with primary and will start reglan to see if this improves distention and also facilitate BM.  Will  keep on full liquids for now.

## 2021-03-08 LAB — CULTURE, BLOOD (ROUTINE X 2)
Culture: NO GROWTH
Culture: NO GROWTH

## 2021-03-08 LAB — BASIC METABOLIC PANEL
Anion gap: 6 (ref 5–15)
BUN: 12 mg/dL (ref 8–23)
CO2: 26 mmol/L (ref 22–32)
Calcium: 8.4 mg/dL — ABNORMAL LOW (ref 8.9–10.3)
Chloride: 108 mmol/L (ref 98–111)
Creatinine, Ser: 0.61 mg/dL (ref 0.61–1.24)
GFR, Estimated: >60 mL/min (ref >60)
Glucose, Bld: 147 mg/dL — ABNORMAL HIGH (ref 70–99)
Potassium: 2.8 mmol/L — ABNORMAL LOW (ref 3.5–5.1)
Sodium: 140 mmol/L (ref 135–145)

## 2021-03-08 LAB — CBC
HCT: 34.8 % — ABNORMAL LOW (ref 39.0–52.0)
Hemoglobin: 11.4 g/dL — ABNORMAL LOW (ref 13.0–17.0)
MCH: 30.5 pg (ref 26.0–34.0)
MCHC: 32.8 g/dL (ref 30.0–36.0)
MCV: 93 fL (ref 80.0–100.0)
Platelets: 181 10*3/uL (ref 150–400)
RBC: 3.74 MIL/uL — ABNORMAL LOW (ref 4.22–5.81)
RDW: 14 % (ref 11.5–15.5)
WBC: 8.7 10*3/uL (ref 4.0–10.5)
nRBC: 0 % (ref 0.0–0.2)

## 2021-03-08 LAB — GLUCOSE, CAPILLARY
Glucose-Capillary: 122 mg/dL — ABNORMAL HIGH (ref 70–99)
Glucose-Capillary: 128 mg/dL — ABNORMAL HIGH (ref 70–99)
Glucose-Capillary: 138 mg/dL — ABNORMAL HIGH (ref 70–99)
Glucose-Capillary: 139 mg/dL — ABNORMAL HIGH (ref 70–99)
Glucose-Capillary: 143 mg/dL — ABNORMAL HIGH (ref 70–99)
Glucose-Capillary: 203 mg/dL — ABNORMAL HIGH (ref 70–99)

## 2021-03-08 LAB — POTASSIUM: Potassium: 4 mmol/L (ref 3.5–5.1)

## 2021-03-08 LAB — MAGNESIUM: Magnesium: 1.8 mg/dL (ref 1.7–2.4)

## 2021-03-08 MED ORDER — METOCLOPRAMIDE HCL 10 MG PO TABS: 5.0000 mg | ORAL_TABLET | Freq: Three times a day (TID) | ORAL | Status: DC

## 2021-03-08 MED ORDER — METHYLPREDNISOLONE SODIUM SUCC 40 MG IJ SOLR
INTRAMUSCULAR | Status: AC
  Administered 2021-03-08: 40 mg
  Filled 2021-03-08: qty 1

## 2021-03-08 MED ORDER — MAGNESIUM SULFATE 4 GM/100ML IV SOLN
4.0000 g | Freq: Once | INTRAVENOUS | Status: AC
  Administered 2021-03-08: 4 g via INTRAVENOUS
  Filled 2021-03-08: qty 100

## 2021-03-08 MED ORDER — POTASSIUM CHLORIDE 10 MEQ/100ML IV SOLN
10.0000 meq | INTRAVENOUS | Status: AC
  Administered 2021-03-08 (×5): 10 meq via INTRAVENOUS
  Filled 2021-03-08 (×5): qty 100

## 2021-03-08 MED ORDER — METOCLOPRAMIDE HCL 10 MG PO TABS
5.0000 mg | ORAL_TABLET | Freq: Three times a day (TID) | ORAL | Status: DC
  Administered 2021-03-08 – 2021-03-09 (×4): 5 mg via ORAL
  Filled 2021-03-08 (×2): qty 1

## 2021-03-08 NOTE — Progress Notes (Signed)
PROGRESS NOTE   [name]  AST:[id number] DOB: 1946-01-27 DOA: 03/03/2021 PCP: Caprice Renshaw, MD   Chief Complaint  Patient presents with   Fatigue   Level of care: Med-Surg  Brief Admission History:  75 year old gentleman long-term resident at [facility] presented to the emergency department with hypoxia.  He chronically wears 3 L of oxygen.  He was noted to have sepsis UTI and bilateral lower lobe infiltrates and started on treatment for aspiration pneumonia and pyuria.  He also was noted to have acute urinary retention and had a Foley catheter placed.  In addition he was noted to have a probable partial small bowel obstruction and surgery was consulted.     Assessment & Plan:   Principal Problem:   Sepsis ([place]) Active Problems:   Essential hypertension   Dementia without behavioral disturbance (HCC)   Lactic acidosis   Aspiration pneumonia of both lower lobes due to gastric secretions (HCC)   SBO (small bowel obstruction) (HCC)   Acute lower UTI   Sepsis - RESOLVED. Secondary to UTI and aspiration pneumonia -his sepsis physiology has resolved with aggressive treatments continue IV antibiotics and current orders.  Lactic acid has normalized.  He is clinically improving. Complete antibiotics after 5 days.    Gastroparesis versus ileus-appreciate surgery consultation.  Abd Xray 6/11 with large amount of gastric distension.  Pt started on metoclopramide on 6/11 and he had a bowel movement this AM.  Per surgery can advance diet.  If tolerates, plan to DC back to SNF home in AM.     Lactic acidosis -resolved.  Klebsiella UTI- treated with IV zosyn which according to culture and sensitivity should adequately treat infection.  Acute urinary retention-treated with Foley placement.  Foley DC'd on 6/8.  He passed a voiding trial and has been urinating.   Type 2 diabetes mellitus-continue SSI coverage and CBG monitoring. CBG (last 3)  Recent Labs    03/08/21 0554 03/08/21 0726  03/08/21 1111  GLUCAP 143* 138* 139*   Dementia-stable on home medications  Hypokalemia - additional IV replacement ordered, recheck in AM.    Hypernatremia - resolved. . . reduce D5W.     DVT prophylaxis: Enoxaparin Code Status: Full Family Communication:  Disposition: Return to SNF when medically stable Status is: Inpatient  Remains inpatient appropriate because:IV treatments appropriate due to intensity of illness or inability to take PO and Inpatient level of care appropriate due to severity of illness  Dispo: The patient is from: SNF              Anticipated d/c is to: SNF              Patient currently is not medically stable to d/c.   Difficult to place patient No  Consultants:  Surgery   Procedures:    Antimicrobials:  Zosyn 6/7>>   Subjective: Pt had a bowel movement this AM.      Objective: Vitals:   03/07/21 0426 03/07/21 1431 03/07/21 2002 03/08/21 0549  BP: (!) 126/54 115/69 (!) 115/40 97/62  Pulse: 91 84 95 90  Resp: (!) 22 18 18 20   Temp: 97.8 F (36.6 C) 98.5 F (36.9 C) 97.8 F (36.6 C) 97.6 F (36.4 C)  TempSrc: Oral Oral Oral Oral  SpO2: 95% 98% 94% 98%  Weight:      Height:        Intake/Output Summary (Last 24 hours) at 03/08/2021 1133 Last data filed at 03/08/2021 0900 Gross per 24 hour  Intake  1440 ml  Output 300 ml  Net 1140 ml   Filed Weights   03/03/21 1150  Weight: 105 kg    Examination:  General exam: awake,alert, Appears calm and comfortable.  Respiratory system: BBS clear to ausc, shallow breathing, no rales heard. Cardiovascular system: normal S1 & S2 heard. No JVD, murmurs, rubs, gallops or clicks. No pedal edema. Gastrointestinal system: Abdomen is nondistended, soft and nontender. No organomegaly or masses felt. Normal bowel sounds. Central nervous system: Alert and oriented to person. No focal neurological deficits. Extremities: Symmetric 5 x 5 power. Skin: No rashes, lesions or ulcers Psychiatry: Judgement and  insight appear poor. Mood & affect appropriate.   Data Reviewed: I have personally reviewed following labs and imaging studies  CBC: Recent Labs  Lab 03/01/21 2238 03/03/21 1214 03/04/21 0419 03/08/21 0430  WBC 9.4 13.3* 9.4 8.7  NEUTROABS  --  10.7*  --   --   HGB 13.4 15.2 12.2* 11.4*  HCT 41.1 47.5 38.8* 34.8*  MCV 92.8 93.3 96.0 93.0  PLT 190 271 180 742    Basic Metabolic Panel: Recent Labs  Lab 03/04/21 0419 03/05/21 0433 03/06/21 0501 03/07/21 0607 03/08/21 0430  NA 145 151* 156* 147* 140  K 3.3* 3.9 3.5 3.2* 2.8*  CL 113* 119* 121* 111 108  CO2 22 24 24 28 26   GLUCOSE 102* 80 71 164* 147*  BUN 56* 44* 29* 19 12  CREATININE 0.92 0.85 0.67 0.70 0.61  CALCIUM 8.8* 9.2 9.3 9.1 8.4*  MG  --  2.5* 2.4  --  1.8    GFR: Estimated Creatinine Clearance: 98.4 mL/min (by C-G formula based on SCr of 0.61 mg/dL).  Liver Function Tests: Recent Labs  Lab 03/01/21 2238 03/03/21 1214  AST 23 17  ALT 18 17  ALKPHOS 84 77  BILITOT 1.5* 1.3*  PROT 7.2 7.5  ALBUMIN 3.4* 3.4*    CBG: Recent Labs  Lab 03/07/21 2106 03/08/21 0006 03/08/21 0554 03/08/21 0726 03/08/21 1111  GLUCAP 137* 128* 143* 138* 139*    Recent Results (from the past 240 hour(s))  Urine culture     Status: Abnormal   Collection Time: 03/02/21  1:28 AM   Specimen: Urine, Catheterized  Result Value Ref Range Status   Specimen Description   Final    URINE, CATHETERIZED Performed at [facility], [address], [place], [place] [address]    Special Requests   Final    NONE Performed at [facility], [address], [place], [place] [address]    Culture >=100,000 COLONIES/mL KLEBSIELLA PNEUMONIAE (A)  Final   Report Status 03/05/2021 FINAL  Final   Organism ID, Bacteria KLEBSIELLA PNEUMONIAE (A)  Final      Susceptibility   Klebsiella pneumoniae - MIC*    AMPICILLIN RESISTANT Resistant     CEFAZOLIN <=4 SENSITIVE Sensitive     CEFEPIME <=0.12 SENSITIVE Sensitive     CEFTRIAXONE <=0.25  SENSITIVE Sensitive     CIPROFLOXACIN <=0.25 SENSITIVE Sensitive     GENTAMICIN <=1 SENSITIVE Sensitive     IMIPENEM <=0.25 SENSITIVE Sensitive     NITROFURANTOIN <=16 SENSITIVE Sensitive     TRIMETH/SULFA <=20 SENSITIVE Sensitive     AMPICILLIN/SULBACTAM 4 SENSITIVE Sensitive     PIP/TAZO <=4 SENSITIVE Sensitive     * >=100,000 COLONIES/mL KLEBSIELLA PNEUMONIAE  Culture, blood (Routine x 2)     Status: None   Collection Time: 03/03/21 12:14 PM   Specimen: BLOOD RIGHT ARM  Result Value Ref Range  Status   Specimen Description BLOOD RIGHT ARM  Final   Special Requests   Final    BOTTLES DRAWN AEROBIC AND ANAEROBIC Blood Culture results may not be optimal due to an inadequate volume of blood received in culture bottles   Culture   Final    NO GROWTH 5 DAYS Performed at [facility], [address], [place], [place] [address]    Report Status 03/08/2021 FINAL  Final  Culture, blood (Routine x 2)     Status: None   Collection Time: 03/03/21 12:14 PM   Specimen: BLOOD  Result Value Ref Range Status   Specimen Description BLOOD LEFT ANTECUBITAL  Final   Special Requests   Final    BOTTLES DRAWN AEROBIC AND ANAEROBIC Blood Culture results may not be optimal due to an inadequate volume of blood received in culture bottles   Culture   Final    NO GROWTH 5 DAYS Performed at [facility], [address], [place], [place] [address]    Report Status 03/08/2021 FINAL  Final  Resp Panel by RT-PCR (Flu A&B, Covid) Nasopharyngeal Swab     Status: None   Collection Time: 03/03/21 12:53 PM   Specimen: Nasopharyngeal Swab; Nasopharyngeal(NP) swabs in vial transport medium  Result Value Ref Range Status   SARS Coronavirus 2 by RT PCR NEGATIVE NEGATIVE Final    Comment: (NOTE) SARS-CoV-2 target nucleic acids are NOT DETECTED.  The SARS-CoV-2 RNA is generally detectable in upper respiratory specimens during the acute phase of infection. The lowest concentration of SARS-CoV-2 viral copies this assay  can detect is 138 copies/mL. A negative result does not preclude SARS-Cov-2 infection and should not be used as the sole basis for treatment or other patient management decisions. A negative result may occur with  improper specimen collection/handling, submission of specimen other than nasopharyngeal swab, presence of viral mutation(s) within the areas targeted by this assay, and inadequate number of viral copies(<138 copies/mL). A negative result must be combined with clinical observations, patient history, and epidemiological information. The expected result is Negative.  Fact Sheet for Patients:  EntrepreneurPulse.com.au  Fact Sheet for Healthcare Providers:  IncredibleEmployment.be  This test is no t yet approved or cleared by the [place] FDA and  has been authorized for detection and/or diagnosis of SARS-CoV-2 by FDA under an Emergency Use Authorization (EUA). This EUA will remain  in effect (meaning this test can be used) for the duration of the COVID-19 declaration under Section 564(b)(1) of the Act, 21 U.S.C.section 360bbb-3(b)(1), unless the authorization is terminated  or revoked sooner.       Influenza A by PCR NEGATIVE NEGATIVE Final   Influenza B by PCR NEGATIVE NEGATIVE Final    Comment: (NOTE) The Xpert Xpress SARS-CoV-2/FLU/RSV plus assay is intended as an aid in the diagnosis of influenza from Nasopharyngeal swab specimens and should not be used as a sole basis for treatment. Nasal washings and aspirates are unacceptable for Xpert Xpress SARS-CoV-2/FLU/RSV testing.  Fact Sheet for Patients: EntrepreneurPulse.com.au  Fact Sheet for Healthcare Providers: IncredibleEmployment.be  This test is not yet approved or cleared by the [place] FDA and has been authorized for detection and/or diagnosis of SARS-CoV-2 by FDA under an Emergency Use Authorization (EUA). This EUA will  remain in effect (meaning this test can be used) for the duration of the COVID-19 declaration under Section 564(b)(1) of the Act, 21 U.S.C. section 360bbb-3(b)(1), unless the authorization is terminated or revoked.  Performed at [facility], [address]  [place], [place] [address]   MRSA PCR Screening     Status: None   Collection Time: 03/03/21  6:15 PM   Specimen: Nasopharyngeal  Result Value Ref Range Status   MRSA by PCR NEGATIVE NEGATIVE Final    Comment:        The GeneXpert MRSA Assay (FDA approved for NASAL specimens only), is one component of a comprehensive MRSA colonization surveillance program. It is not intended to diagnose MRSA infection nor to guide or monitor treatment for MRSA infections. Performed at [facility], [address] [place], [place] [address]      Radiology Studies: DG Abd Portable 2V  Result Date: 03/07/2021 CLINICAL DATA:  Ileus EXAM: PORTABLE ABDOMEN - 2 VIEW COMPARISON:  03/05/2021 FINDINGS: Marked gaseous distension of the stomach. Gas distended colon. A few small nondilated gas-filled small bowel loops are visible in the right lower quadrant. No acute osseous abnormality. IMPRESSION: Marked gaseous distension of the stomach, patient may benefit from nasogastric tube placement. Contrast material has passed into the colon since the prior exam. A few nondilated small bowel loops are visible. Electronically Signed   By: Maurine Simmering   On: 03/07/2021 08:53     Scheduled Meds:  bisacodyl  10 mg Rectal BID   Chlorhexidine Gluconate Cloth  6 each Topical Daily   cycloSPORINE  1 drop Both Eyes BID   enoxaparin (LOVENOX) injection  40 mg Subcutaneous Q24H   metoCLOPramide  5 mg Oral TID AC   pantoprazole (PROTONIX) IV  40 mg Intravenous Q24H   polyethylene glycol  17 g Oral Daily   Continuous Infusions:  dextrose 50 mL/hr at 03/08/21 0844   piperacillin-tazobactam (ZOSYN)  IV 3.375 g (03/08/21 1119)   potassium chloride 10 mEq (03/08/21 1119)      LOS: 5 days   Time spent: 35 mins  Wylma Tatem Wynetta Emery, MD How to contact the [facility] Attending or Consulting provider [place] or covering provider during after hours [place], for this patient?  Check the care team in [facility] and look for a) attending/consulting [facility] provider listed and b) the [facility] team listed Log into [email or web address] and use [place]'s universal password to access. If you do not have the password, please contact the hospital operator. Locate the [facility] provider you are looking for under [facility] and page to a number that you can be directly reached. If you still have difficulty reaching the provider, please page the [facility] (Director on Call) for the Hospitalists listed on amion for assistance.  03/08/2021, 11:33 AM

## 2021-03-08 NOTE — Progress Notes (Signed)
Subjective:  CC: [name] is a 75 y.o. male  Hospital stay day 5,   gastroparesis vs ileus  HPI: No specific complaints or issues reported.  Recorded BM this am.  Tolerating diet  ROS:  General: Denies weight loss, weight gain, fatigue, fevers, chills, and night sweats. Heart: Denies chest pain, palpitations, racing heart, irregular heartbeat, leg pain or swelling, and decreased activity tolerance. Respiratory: Denies breathing difficulty, shortness of breath, wheezing, cough, and sputum. GI: Denies change in appetite, heartburn, nausea, vomiting, constipation, diarrhea, and blood in stool. GU: Denies difficulty urinating, pain with urinating, urgency, frequency, blood in urine.   Objective:   Temp:  [97.6 F (36.4 C)-98.5 F (36.9 C)] 97.6 F (36.4 C) (06/12 0549) Pulse Rate:  [84-95] 90 (06/12 0549) Resp:  [18-20] 20 (06/12 0549) BP: (97-115)/(40-69) 97/62 (06/12 0549) SpO2:  [94 %-98 %] 98 % (06/12 0549)     Height: 6' (182.9 cm) Weight: 105 kg BMI (Calculated): 31.39   Intake/Output this shift:   Intake/Output Summary (Last 24 hours) at 03/08/2021 0950 Last data filed at 03/08/2021 0900 Gross per 24 hour  Intake 1440 ml  Output 600 ml  Net 840 ml    Constitutional :  alert, cooperative, appears stated age, and no distress  Respiratory:  clear to auscultation bilaterally  Cardiovascular:  regular rate and rhythm  Gastrointestinal: Soft, no guarding, improved distention in stomach, no TTP .   Skin: Cool and moist.   Psychiatric: Normal affect, non-agitated, not confused       LABS:  CMP Latest Ref Rng & Units 03/08/2021 03/07/2021 03/06/2021  Glucose 70 - 99 mg/dL 147(H) 164(H) 71  BUN 8 - 23 mg/dL 12 19 29(H)  Creatinine 0.61 - 1.24 mg/dL 0.61 0.70 0.67  Sodium 135 - 145 mmol/L 140 147(H) 156(H)  Potassium 3.5 - 5.1 mmol/L 2.8(L) 3.2(L) 3.5  Chloride 98 - 111 mmol/L 108 111 121(H)  CO2 22 - 32 mmol/L 26 28 24   Calcium 8.9 - 10.3 mg/dL 8.4(L) 9.1 9.3  Total  Protein 6.5 - 8.1 g/dL - - -  Total Bilirubin 0.3 - 1.2 mg/dL - - -  Alkaline Phos 38 - 126 U/L - - -  AST 15 - 41 U/L - - -  ALT 0 - 44 U/L - - -   CBC Latest Ref Rng & Units 03/08/2021 03/04/2021 03/03/2021  WBC 4.0 - 10.5 K/uL 8.7 9.4 13.3(H)  Hemoglobin 13.0 - 17.0 g/dL 11.4(L) 12.2(L) 15.2  Hematocrit 39.0 - 52.0 % 34.8(L) 38.8(L) 47.5  Platelets 150 - 400 K/uL 181 180 271    RADS: N/a  Assessment:   Gastroparesis.  Pt is passing flatus, and now had recorded BM.  Ok to advance diet as tolerated.  Further medical care per primary team.  Surgery to sign off, no need for outpt f/u.  Please call with any questions

## 2021-03-09 LAB — GLUCOSE, CAPILLARY
Glucose-Capillary: 108 mg/dL — ABNORMAL HIGH (ref 70–99)
Glucose-Capillary: 99 mg/dL (ref 70–99)
Glucose-Capillary: 104 mg/dL — ABNORMAL HIGH (ref 70–99)

## 2021-03-09 MED ORDER — METOCLOPRAMIDE HCL 5 MG PO TABS: 5.0000 mg | ORAL_TABLET | Freq: Three times a day (TID) | ORAL | Status: AC

## 2021-03-09 MED ORDER — POLYETHYLENE GLYCOL 3350 17 G PO PACK: 17.0000 g | PACK | Freq: Every day | ORAL | 0 refills | Status: AC | PRN

## 2021-03-09 MED ORDER — FUROSEMIDE 20 MG PO TABS: 20.0000 mg | ORAL_TABLET | ORAL | Status: DC

## 2021-03-09 MED ORDER — POTASSIUM CHLORIDE CRYS ER 10 MEQ PO TBCR: 10.0000 meq | EXTENDED_RELEASE_TABLET | ORAL | Status: DC

## 2021-03-09 NOTE — Discharge Summary (Signed)
Physician Discharge Summary  [name] FBP:[id number] DOB: October 22, 1945 DOA: 03/03/2021  PCP: Caprice Renshaw, MD  Admit date: 03/03/2021 Discharge date: 03/09/2021  Admitted From:  [facility] Disposition:  [name]   Recommendations for Outpatient Follow-up:  Please check BMP/CBC in 2 weeks Soft diet recommended  Outpatient palliative care consult recommended for goals of care  Please assist with feeding patient.  Blood glucose monitoring per facility protocol Monitor for recurrent urinary retention, currently urinating on his own with no problem  Discharge Condition: STABLE   CODE STATUS: FULL DIET: soft foods, heart healthy carb modified    Brief Hospitalization Summary: Please see all hospital notes, images, labs for full details of the hospitalization. ADMISSION HPI: [name] is a 75 y.o. male with medical history significant for dementia, hypertension, GERD, diabetes mellitus who was sent to the ER from the skilled nursing facility for evaluation of lethargy and low O2 sats.  Patient has a history of chronic respiratory failure, wears 3 L of oxygen chronically.  He was noted to have pulse oximetry of 90% upon arrival to triage. Patient was seen in the ER on 03/01/21 for evaluation of emesis and at that time had room air pulse oximetry of 85%.  He was discharged back to the skilled nursing facility on Keflex for presumed UTI.  Patient returns to the ER on 06/07 and continues to have emesis.  He had a low-grade fever with a T-max of 100.7 and was tachycardic with heart rate of 107bpm. Labs revealed lactic acidosis and bilateral lower lobe infiltrates.  Patient initially required 4 L of oxygen but is back to his baseline 3L.  I am unable to do a review of systems on this patient due to his underlying dementia.  Labs show sodium 144, potassium 4.1, chloride 107, bicarb 22, glucose 164, BUN 67, creatinine 1.26, calcium 9.4, alkaline phosphatase 77, albumin 3.4, AST 17, ALT 17, total protein  7.5, total bilirubin 1.3, lactic acid 3.3 >> 3.6, white count 13.3, hemoglobin 15.2, hematocrit 47.5, MCV 93.3, RDW 14.0, platelet count 271, PT 15.3, INR 1.2 Respiratory viral panel is negative.  Chest x-ray reviewed by me shows low lung volumes with bibasilar atelectasis/infiltrates. Gastric distention. Renal stone CT shows moderate bilateral posterior basilar atelectasis or infiltrates are noted. Moderate gastric distention is noted with proximal small bowel dilatation with possible transition zone seen in right lower  quadrant. This is concerning for probable partial small bowel obstruction. Status post partial right colectomy.   Bilateral nonobstructive nephrolithiasis. 8 mm nonobstructive calculus seen in right renal pelvis. Aortic Atherosclerosis   Twelve-lead EKG reviewed by me shows sinus tachycardia with PACs.    ED Course: Patient is a 75 year old male who was sent to the ER from the skilled nursing facility for evaluation of hypoxia and fever.  Patient has had multiple episodes of emesis and imaging is suggestive of a partial small bowel obstruction.  Patient appears to be septic and has a fever, tachycardia, leukocytosis as well as lactic acidosis.  Source of sepsis appears to be bilateral pneumonia concerning for aspiration.  He will be admitted to the hospital for further evaluation.   HOSPITAL COURSE  Brief Admission History:  75 year old gentleman long-term resident at [facility] presented to the emergency department with hypoxia.  He chronically wears 3 L of oxygen.  He was noted to have sepsis UTI and bilateral lower lobe infiltrates and started on treatment for aspiration pneumonia and pyuria.  He also was noted to have acute  urinary retention and had a Foley catheter placed.  In addition he was noted to have a probable partial small bowel obstruction and surgery was consulted.      Assessment & Plan:   Principal Problem:   Sepsis  Active Problems:   Essential hypertension    Dementia without behavioral disturbance    Lactic acidosis   Aspiration pneumonia of both lower lobes due to gastric secretions    SBO (small bowel obstruction)    Acute lower UTI    Sepsis - RESOLVED. Secondary to UTI and aspiration pneumonia -his sepsis physiology has resolved with aggressive treatments continue IV antibiotics and current orders.  Lactic acid has normalized.  He is clinically improving. Complete antibiotics after 5 days.     Gastroparesis versus ileus-appreciate surgery consultation.  Abd Xray 6/11 with large amount of gastric distension.  Pt started on metoclopramide on 6/11 and he had a bowel movement this AM.  Per surgery can advance diet.  If tolerates, plan to DC back to SNF home in AM.      Lactic acidosis -resolved.   Klebsiella UTI- treated with IV zosyn which according to culture and sensitivity should adequately treat infection.   Acute urinary retention-treated with Foley placement.  Foley DC'd on 6/8.  He passed a voiding trial and has been urinating.    Type 2 diabetes mellitus-continue SSI coverage and CBG monitoring. CBG (last 3)  Recent Labs    03/08/21 1609 03/08/21 2258 03/09/21 0224  GLUCAP 203* 122* 108*    Dementia-stable on home medications   Hypokalemia - THIS HAS BEEN REPLETED.     Hypernatremia - resolved. . . DC D5W.      DVT prophylaxis: Enoxaparin Code Status: Full  Disposition: Return to SNF Status is: Inpatient   Discharge Diagnoses:  Principal Problem:   Sepsis ([place]) Active Problems:   Essential hypertension   Dementia without behavioral disturbance (HCC)   Lactic acidosis   Aspiration pneumonia of both lower lobes due to gastric secretions (HCC)   SBO (small bowel obstruction) (HCC)   Acute lower UTI   Discharge Instructions:  Allergies as of 03/09/2021       Reactions   Hydrocodone    Hydrocodone-acetaminophen    unknown   Aplisol [tuberculin Ppd] Rash        Medication List     STOP taking these  medications    atenolol 25 MG tablet Commonly known as: TENORMIN   cephALEXin 500 MG capsule Commonly known as: Keflex   ferrous sulfate 325 (65 FE) MG EC tablet   potassium chloride 20 MEQ packet Commonly known as: KLOR-CON       TAKE these medications    acetaminophen 650 MG CR tablet Commonly known as: TYLENOL Take 1 tablet (650 mg total) by mouth every 8 (eight) hours as needed for pain or fever.   allopurinol 100 MG tablet Commonly known as: ZYLOPRIM Take 100 mg by mouth daily.   atorvastatin 10 MG tablet Commonly known as: LIPITOR Take 5 mg by mouth at bedtime.   cholecalciferol 25 MCG (1000 UNIT) tablet Commonly known as: VITAMIN D3 Take 1,000 Units by mouth daily.   clobetasol cream 0.05 % Commonly known as: TEMOVATE Apply 1 application topically 2 (two) times daily.   cycloSPORINE 0.05 % ophthalmic emulsion Commonly known as: RESTASIS Place 1 drop into both eyes 2 (two) times daily.   Eucerin Eczema Relief 1 % Crea Generic drug: Colloidal Oatmeal Apply 1 application topically 2 (two) times daily.  Apply to face and neck   feeding supplement Liqd Take 237 mLs by mouth 2 (two) times daily between meals.   furosemide 20 MG tablet Commonly known as: LASIX Take 1 tablet (20 mg total) by mouth every other day. Start taking on: March 11, 2021 What changed:  medication strength how much to take when to take this   ketoconazole 2 % shampoo Commonly known as: NIZORAL Apply 1 application topically every 12 (twelve) hours as needed for irritation. Apply to Scalp   metFORMIN 500 MG tablet Commonly known as: GLUCOPHAGE Take 500 mg by mouth at bedtime.   metoCLOPramide 5 MG tablet Commonly known as: REGLAN Take 1 tablet (5 mg total) by mouth 3 (three) times daily before meals.   ondansetron 4 MG tablet Commonly known as: ZOFRAN Take 4 mg by mouth every 6 (six) hours as needed for nausea or vomiting.   polyethylene glycol 17 g packet Commonly known  as: MIRALAX / GLYCOLAX Take 17 g by mouth daily as needed for mild constipation.   potassium chloride 10 MEQ tablet Commonly known as: KLOR-CON Take 1 tablet (10 mEq total) by mouth every other day. Start taking on: March 11, 2021   vitamin B-12 1000 MCG tablet Commonly known as: CYANOCOBALAMIN Take 1,000 mcg by mouth daily.        Contact information for after-discharge care     [facility] .   Service: Skilled Nursing Contact information: [place] [phone]                    Allergies  Allergen Reactions   Hydrocodone    Hydrocodone-Acetaminophen     unknown   Aplisol [Tuberculin Ppd] Rash   Allergies as of 03/09/2021       Reactions   Hydrocodone    Hydrocodone-acetaminophen    unknown   Aplisol [tuberculin Ppd] Rash        Medication List     STOP taking these medications    atenolol 25 MG tablet Commonly known as: TENORMIN   cephALEXin 500 MG capsule Commonly known as: Keflex   ferrous sulfate 325 (65 FE) MG EC tablet   potassium chloride 20 MEQ packet Commonly known as: KLOR-CON       TAKE these medications    acetaminophen 650 MG CR tablet Commonly known as: TYLENOL Take 1 tablet (650 mg total) by mouth every 8 (eight) hours as needed for pain or fever.   allopurinol 100 MG tablet Commonly known as: ZYLOPRIM Take 100 mg by mouth daily.   atorvastatin 10 MG tablet Commonly known as: LIPITOR Take 5 mg by mouth at bedtime.   cholecalciferol 25 MCG (1000 UNIT) tablet Commonly known as: VITAMIN D3 Take 1,000 Units by mouth daily.   clobetasol cream 0.05 % Commonly known as: TEMOVATE Apply 1 application topically 2 (two) times daily.   cycloSPORINE 0.05 % ophthalmic emulsion Commonly known as: RESTASIS Place 1 drop into both eyes 2 (two) times daily.   Eucerin Eczema Relief 1 % Crea Generic drug: Colloidal Oatmeal Apply 1  application topically 2 (two) times daily. Apply to face and neck   feeding supplement Liqd Take 237 mLs by mouth 2 (two) times daily between meals.   furosemide 20 MG tablet Commonly known as: LASIX Take 1 tablet (20 mg total) by mouth every other day. Start taking on: March 11, 2021 What changed:  medication strength how much  to take when to take this   ketoconazole 2 % shampoo Commonly known as: NIZORAL Apply 1 application topically every 12 (twelve) hours as needed for irritation. Apply to Scalp   metFORMIN 500 MG tablet Commonly known as: GLUCOPHAGE Take 500 mg by mouth at bedtime.   metoCLOPramide 5 MG tablet Commonly known as: REGLAN Take 1 tablet (5 mg total) by mouth 3 (three) times daily before meals.   ondansetron 4 MG tablet Commonly known as: ZOFRAN Take 4 mg by mouth every 6 (six) hours as needed for nausea or vomiting.   polyethylene glycol 17 g packet Commonly known as: MIRALAX / GLYCOLAX Take 17 g by mouth daily as needed for mild constipation.   potassium chloride 10 MEQ tablet Commonly known as: KLOR-CON Take 1 tablet (10 mEq total) by mouth every other day. Start taking on: March 11, 2021   vitamin B-12 1000 MCG tablet Commonly known as: CYANOCOBALAMIN Take 1,000 mcg by mouth daily.        Procedures/Studies: DG Chest Port 1 View  Result Date: 03/03/2021 CLINICAL DATA:  Lethargy. EXAM: PORTABLE CHEST 1 VIEW COMPARISON:  Chest x-ray 09/06/2020. FINDINGS: Cardiomegaly. No pulmonary venous congestion. Low lung volumes with bibasilar atelectasis/infiltrates. No pleural effusion or pneumothorax. Gastric distention noted. Opacity noted over the right upper chest may be outside the patient. Diffuse osteopenia. Degenerative change thoracic spine and both shoulders. IMPRESSION: 1.  Low lung volumes with bibasilar atelectasis/infiltrates. 2.  Gastric distention. Electronically Signed   By: Marcello Moores  Register   On: 03/03/2021 12:55   DG Chest Port 1V same  Day  Result Date: 03/03/2021 CLINICAL DATA:  NG tube placement. EXAM: PORTABLE CHEST 1 VIEW COMPARISON:  03/03/2021. FINDINGS: NG tube noted with tip and side hole over the stomach. Prominent gastric distention noted. Cardiomegaly. No pulmonary venous congestion. Low lung volumes with bibasilar atelectasis. No pleural effusion or pneumothorax. Degenerative changes thoracolumbar spine. IMPRESSION: 1. NG tube noted with tip and side hole over the stomach. Prominent gastric distention noted. 2.  Cardiomegaly.  No pulmonary venous congestion. 3.  Low lung volumes with bibasilar atelectasis. Electronically Signed   By: Marcello Moores  Register   On: 03/03/2021 17:00   DG ABD ACUTE 2+V W 1V CHEST  Result Date: 03/01/2021 CLINICAL DATA:  Abdominal pain EXAM: DG ABDOMEN ACUTE WITH 1 VIEW CHEST COMPARISON:  None. FINDINGS: Nonspecific focal opacity in the right mid lung. No focal airspace consolidation. No pleural effusion or pneumothorax. Stool ball noted in the rectum. There is prominent small bowel in the right lower quadrant. IMPRESSION: 1. Nonspecific focal opacity in the right mid lung. This may indicate scarring/atelectasis or a pulmonary nodule. Follow-up chest radiograph or chest CT is recommended. 2. Stool ball in the rectum with prominent small bowel in the right lower quadrant. Electronically Signed   By: Ulyses Jarred M.D.   On: 03/01/2021 23:49   DG Abd Portable 1V-Small Bowel Obstruction Protocol-initial, 8 hr delay  Result Date: 03/05/2021 CLINICAL DATA:  Contrast administered at 0850 hours by history. Delayed film at 1700 hours. EXAM: PORTABLE ABDOMEN - 1 VIEW COMPARISON:  03/01/2021 FINDINGS: Contrast is present within the fundus of the stomach. I do not see that any has passed into the small intestine or colon. There is mild gaseous distension of the stomach, but the other bowel loops appear within normal limits. IMPRESSION: Previously administered contrast appears to remain in the fundus of the stomach,  without any visible egress into the small bowel or colon. Electronically Signed  By: Nelson Chimes M.D.   On: 03/05/2021 19:08   DG Abd Portable 2V  Result Date: 03/07/2021 CLINICAL DATA:  Ileus EXAM: PORTABLE ABDOMEN - 2 VIEW COMPARISON:  03/05/2021 FINDINGS: Marked gaseous distension of the stomach. Gas distended colon. A few small nondilated gas-filled small bowel loops are visible in the right lower quadrant. No acute osseous abnormality. IMPRESSION: Marked gaseous distension of the stomach, patient may benefit from nasogastric tube placement. Contrast material has passed into the colon since the prior exam. A few nondilated small bowel loops are visible. Electronically Signed   By: Maurine Simmering   On: 03/07/2021 08:53   CT Renal Stone Study  Result Date: 03/03/2021 CLINICAL DATA:  Flank pain. EXAM: CT ABDOMEN AND PELVIS WITHOUT CONTRAST TECHNIQUE: Multidetector CT imaging of the abdomen and pelvis was performed following the standard protocol without IV contrast. COMPARISON:  September 07, 2020. FINDINGS: Lower chest: Mild bilateral posterior basilar atelectasis or infiltrates are noted. Hepatobiliary: No focal liver abnormality is seen. No gallstones, gallbladder wall thickening, or biliary dilatation. Pancreas: Unremarkable. No pancreatic ductal dilatation or surrounding inflammatory changes. Spleen: Normal in size without focal abnormality. Adrenals/Urinary Tract: Adrenal glands appear normal. Bilateral nonobstructive nephrolithiasis is noted. 8 mm nonobstructive calculus is noted in right renal pelvis. No hydronephrosis or renal obstruction is noted. Several bladder diverticula are noted. Stomach/Bowel: Moderate gastric distention is noted. Small bowel dilatation is noted with probable transition zone in right lower quadrant as there is nondilated small bowel in this area. Status post right partial colectomy. No colonic dilatation is noted. Stool is noted in the rectum. Vascular/Lymphatic: Aortic  atherosclerosis. No enlarged abdominal or pelvic lymph nodes. Reproductive: Prostate is unremarkable. Other: No abdominal wall hernia or abnormality. No abdominopelvic ascites. Musculoskeletal: No acute or significant osseous findings. IMPRESSION: Moderate bilateral posterior basilar atelectasis or infiltrates are noted. Moderate gastric distention is noted with proximal small bowel dilatation with possible transition zone seen in right lower quadrant. This is concerning for probable partial small bowel obstruction. Status post partial right colectomy. Bilateral nonobstructive nephrolithiasis. 8 mm nonobstructive calculus seen in right renal pelvis. Aortic Atherosclerosis (ICD10-I70.0). Electronically Signed   By: Marijo [place]ception M.D.   On: 03/03/2021 13:57     Subjective: Pt is having bowel movements, he is eating and drinking well and having no abdominal pain.    Discharge Exam: Vitals:   03/08/21 2036 03/09/21 0521  BP: 115/62 (!) 114/51  Pulse: (!) 44 83  Resp: 18 20  Temp: 98 F (36.7 C) (!) 97.5 F (36.4 C)  SpO2:  99%   Vitals:   03/08/21 0549 03/08/21 1413 03/08/21 2036 03/09/21 0521  BP: 97/62 135/66 115/62 (!) 114/51  Pulse: 90 90 (!) 44 83  Resp: 20 20 18 20   Temp: 97.6 F (36.4 C) 98 F (36.7 C) 98 F (36.7 C) (!) 97.5 F (36.4 C)  TempSrc: Oral Oral Oral Oral  SpO2: 98% 98%  99%  Weight:      Height:       General exam: awake,alert, Appears calm and comfortable. Respiratory system: BBS clear to ausc, shallow breathing, no rales heard. Cardiovascular system: normal S1 & S2 heard. No JVD, murmurs, rubs, gallops or clicks. No pedal edema. Gastrointestinal system: Abdomen is nondistended, soft and nontender. No organomegaly or masses felt. Normal bowel sounds. Central nervous system: Alert and oriented to person. No focal neurological deficits. Extremities: Symmetric 5 x 5 power. Skin: No rashes, lesions or ulcers.   Psychiatry: Judgement and insight  appear poor. Mood &  affect appropriate.   The results of significant diagnostics from this hospitalization (including imaging, microbiology, ancillary and laboratory) are listed below for reference.     Microbiology: Recent Results (from the past 240 hour(s))  Urine culture     Status: Abnormal   Collection Time: 03/02/21  1:28 AM   Specimen: Urine, Catheterized  Result Value Ref Range Status   Specimen Description   Final    URINE, CATHETERIZED Performed at [facility], [address], [place], [place] [address]    Special Requests   Final    NONE Performed at [facility], [address] [place], [place] [address]    Culture >=100,000 COLONIES/mL KLEBSIELLA PNEUMONIAE (A)  Final   Report Status 03/05/2021 FINAL  Final   Organism ID, Bacteria KLEBSIELLA PNEUMONIAE (A)  Final      Susceptibility   Klebsiella pneumoniae - MIC*    AMPICILLIN RESISTANT Resistant     CEFAZOLIN <=4 SENSITIVE Sensitive     CEFEPIME <=0.12 SENSITIVE Sensitive     CEFTRIAXONE <=0.25 SENSITIVE Sensitive     CIPROFLOXACIN <=0.25 SENSITIVE Sensitive     GENTAMICIN <=1 SENSITIVE Sensitive     IMIPENEM <=0.25 SENSITIVE Sensitive     NITROFURANTOIN <=16 SENSITIVE Sensitive     TRIMETH/SULFA <=20 SENSITIVE Sensitive     AMPICILLIN/SULBACTAM 4 SENSITIVE Sensitive     PIP/TAZO <=4 SENSITIVE Sensitive     * >=100,000 COLONIES/mL KLEBSIELLA PNEUMONIAE  Culture, blood (Routine x 2)     Status: None   Collection Time: 03/03/21 12:14 PM   Specimen: BLOOD RIGHT ARM  Result Value Ref Range Status   Specimen Description BLOOD RIGHT ARM  Final   Special Requests   Final    BOTTLES DRAWN AEROBIC AND ANAEROBIC Blood Culture results may not be optimal due to an inadequate volume of blood received in culture bottles   Culture   Final    NO GROWTH 5 DAYS Performed at [facility], [address], [place], [place] [address]    Report Status 03/08/2021 FINAL  Final  Culture, blood (Routine x 2)     Status: None   Collection Time:  03/03/21 12:14 PM   Specimen: BLOOD  Result Value Ref Range Status   Specimen Description BLOOD LEFT ANTECUBITAL  Final   Special Requests   Final    BOTTLES DRAWN AEROBIC AND ANAEROBIC Blood Culture results may not be optimal due to an inadequate volume of blood received in culture bottles   Culture   Final    NO GROWTH 5 DAYS Performed at [facility], [address] [place],  [address]    Report Status 03/08/2021 FINAL  Final  Resp Panel by RT-PCR (Flu A&B, Covid) Nasopharyngeal Swab     Status: None   Collection Time: 03/03/21 12:53 PM   Specimen: Nasopharyngeal Swab; Nasopharyngeal(NP) swabs in vial transport medium  Result Value Ref Range Status   SARS Coronavirus 2 by RT PCR NEGATIVE NEGATIVE Final    Comment: (NOTE) SARS-CoV-2 target nucleic acids are NOT DETECTED.  The SARS-CoV-2 RNA is generally detectable in upper respiratory specimens during the acute phase of infection. The lowest concentration of SARS-CoV-2 viral copies this assay can detect is 138 copies/mL. A negative result does not preclude SARS-Cov-2 infection and should not be used as the sole basis for treatment or other patient management decisions. A negative result may occur with  improper specimen collection/handling, submission of specimen other than nasopharyngeal swab, presence of viral mutation(s)  within the areas targeted by this assay, and inadequate number of viral copies(<138 copies/mL). A negative result must be combined with clinical observations, patient history, and epidemiological information. The expected result is Negative.  Fact Sheet for Patients:  EntrepreneurPulse.com.au  Fact Sheet for Healthcare Providers:  IncredibleEmployment.be  This test is no t yet approved or cleared by the [place] FDA and  has been authorized for detection and/or diagnosis of SARS-CoV-2 by FDA under an Emergency Use Authorization (EUA). This EUA will remain   in effect (meaning this test can be used) for the duration of the COVID-19 declaration under Section 564(b)(1) of the Act, 21 U.S.C.section 360bbb-3(b)(1), unless the authorization is terminated  or revoked sooner.       Influenza A by PCR NEGATIVE NEGATIVE Final   Influenza B by PCR NEGATIVE NEGATIVE Final    Comment: (NOTE) The Xpert Xpress SARS-CoV-2/FLU/RSV plus assay is intended as an aid in the diagnosis of influenza from Nasopharyngeal swab specimens and should not be used as a sole basis for treatment. Nasal washings and aspirates are unacceptable for Xpert Xpress SARS-CoV-2/FLU/RSV testing.  Fact Sheet for Patients: EntrepreneurPulse.com.au  Fact Sheet for Healthcare Providers: IncredibleEmployment.be  This test is not yet approved or cleared by the [place] FDA and has been authorized for detection and/or diagnosis of SARS-CoV-2 by FDA under an Emergency Use Authorization (EUA). This EUA will remain in effect (meaning this test can be used) for the duration of the COVID-19 declaration under Section 564(b)(1) of the Act, 21 U.S.C. section 360bbb-3(b)(1), unless the authorization is terminated or revoked.  Performed at [facility], [address], [place], [place] [address]   MRSA PCR Screening     Status: None   Collection Time: 03/03/21  6:15 PM   Specimen: Nasopharyngeal  Result Value Ref Range Status   MRSA by PCR NEGATIVE NEGATIVE Final    Comment:        The GeneXpert MRSA Assay (FDA approved for NASAL specimens only), is one component of a comprehensive MRSA colonization surveillance program. It is not intended to diagnose MRSA infection nor to guide or monitor treatment for MRSA infections. Performed at [facility], [address], [place], [place] [address]      Labs: BNP (last 3 results) No results for input(s): BNP in the last 8760 hours. Basic Metabolic Panel: Recent Labs  Lab 03/04/21 0419  03/05/21 0433 03/06/21 0501 03/07/21 0607 03/08/21 0430 03/08/21 1400  NA 145 151* 156* 147* 140  --   K 3.3* 3.9 3.5 3.2* 2.8* 4.0  CL 113* 119* 121* 111 108  --   CO2 22 24 24 28 26   --   GLUCOSE 102* 80 71 164* 147*  --   BUN 56* 44* 29* 19 12  --   CREATININE 0.92 0.85 0.67 0.70 0.61  --   CALCIUM 8.8* 9.2 9.3 9.1 8.4*  --   MG  --  2.5* 2.4  --  1.8  --    Liver Function Tests: Recent Labs  Lab 03/03/21 1214  AST 17  ALT 17  ALKPHOS 77  BILITOT 1.3*  PROT 7.5  ALBUMIN 3.4*   No results for input(s): LIPASE, AMYLASE in the last 168 hours. No results for input(s): AMMONIA in the last 168 hours. CBC: Recent Labs  Lab 03/03/21 1214 03/04/21 0419 03/08/21 0430  WBC 13.3* 9.4 8.7  NEUTROABS 10.7*  --   --   HGB 15.2 12.2* 11.4*  HCT 47.5 38.8* 34.8*  MCV  93.3 96.0 93.0  PLT 271 180 181   Cardiac Enzymes: No results for input(s): CKTOTAL, CKMB, CKMBINDEX, TROPONINI in the last 168 hours. BNP: Invalid input(s): POCBNP CBG: Recent Labs  Lab 03/08/21 0726 03/08/21 1111 03/08/21 1609 03/08/21 2258 03/09/21 0224  GLUCAP 138* 139* 203* 122* 108*   D-Dimer No results for input(s): DDIMER in the last 72 hours. Hgb A1c No results for input(s): HGBA1C in the last 72 hours. Lipid Profile No results for input(s): CHOL, HDL, LDLCALC, TRIG, CHOLHDL, LDLDIRECT in the last 72 hours. Thyroid function studies No results for input(s): TSH, T4TOTAL, T3FREE, THYROIDAB in the last 72 hours.  Invalid input(s): FREET3 Anemia work up No results for input(s): VITAMINB12, FOLATE, FERRITIN, TIBC, IRON, RETICCTPCT in the last 72 hours. Urinalysis    Component Value Date/Time   COLORURINE YELLOW 03/03/2021 1157   APPEARANCEUR CLOUDY (A) 03/03/2021 1157   LABSPEC 1.019 03/03/2021 1157   PHURINE 5.0 03/03/2021 1157   GLUCOSEU NEGATIVE 03/03/2021 1157   HGBUR MODERATE (A) 03/03/2021 1157   BILIRUBINUR NEGATIVE 03/03/2021 1157   KETONESUR 5 (A) 03/03/2021 1157   PROTEINUR  NEGATIVE 03/03/2021 1157   UROBILINOGEN 0.2 05/27/2010 2038   NITRITE NEGATIVE 03/03/2021 1157   LEUKOCYTESUR LARGE (A) 03/03/2021 1157   Sepsis Labs Invalid input(s): PROCALCITONIN,  WBC,  LACTICIDVEN Microbiology Recent Results (from the past 240 hour(s))  Urine culture     Status: Abnormal   Collection Time: 03/02/21  1:28 AM   Specimen: Urine, Catheterized  Result Value Ref Range Status   Specimen Description   Final    URINE, CATHETERIZED Performed at [facility], [address], [place], [place] [address]    Special Requests   Final    NONE Performed at [facility], [address], [place], [place] [address]    Culture >=100,000 COLONIES/mL KLEBSIELLA PNEUMONIAE (A)  Final   Report Status 03/05/2021 FINAL  Final   Organism ID, Bacteria KLEBSIELLA PNEUMONIAE (A)  Final      Susceptibility   Klebsiella pneumoniae - MIC*    AMPICILLIN RESISTANT Resistant     CEFAZOLIN <=4 SENSITIVE Sensitive     CEFEPIME <=0.12 SENSITIVE Sensitive     CEFTRIAXONE <=0.25 SENSITIVE Sensitive     CIPROFLOXACIN <=0.25 SENSITIVE Sensitive     GENTAMICIN <=1 SENSITIVE Sensitive     IMIPENEM <=0.25 SENSITIVE Sensitive     NITROFURANTOIN <=16 SENSITIVE Sensitive     TRIMETH/SULFA <=20 SENSITIVE Sensitive     AMPICILLIN/SULBACTAM 4 SENSITIVE Sensitive     PIP/TAZO <=4 SENSITIVE Sensitive     * >=100,000 COLONIES/mL KLEBSIELLA PNEUMONIAE  Culture, blood (Routine x 2)     Status: None   Collection Time: 03/03/21 12:14 PM   Specimen: BLOOD RIGHT ARM  Result Value Ref Range Status   Specimen Description BLOOD RIGHT ARM  Final   Special Requests   Final    BOTTLES DRAWN AEROBIC AND ANAEROBIC Blood Culture results may not be optimal due to an inadequate volume of blood received in culture bottles   Culture   Final    NO GROWTH 5 DAYS Performed at [facility], [address] [place], [place] [address]    Report Status 03/08/2021 FINAL  Final  Culture, blood (Routine x 2)     Status: None    Collection Time: 03/03/21 12:14 PM   Specimen: BLOOD  Result Value Ref Range Status   Specimen Description BLOOD LEFT ANTECUBITAL  Final   Special Requests   Final    BOTTLES  DRAWN AEROBIC AND ANAEROBIC Blood Culture results may not be optimal due to an inadequate volume of blood received in culture bottles   Culture   Final    NO GROWTH 5 DAYS Performed at [facility], [address], [place], [place] [address]    Report Status 03/08/2021 FINAL  Final  Resp Panel by RT-PCR (Flu A&B, Covid) Nasopharyngeal Swab     Status: None   Collection Time: 03/03/21 12:53 PM   Specimen: Nasopharyngeal Swab; Nasopharyngeal(NP) swabs in vial transport medium  Result Value Ref Range Status   SARS Coronavirus 2 by RT PCR NEGATIVE NEGATIVE Final    Comment: (NOTE) SARS-CoV-2 target nucleic acids are NOT DETECTED.  The SARS-CoV-2 RNA is generally detectable in upper respiratory specimens during the acute phase of infection. The lowest concentration of SARS-CoV-2 viral copies this assay can detect is 138 copies/mL. A negative result does not preclude SARS-Cov-2 infection and should not be used as the sole basis for treatment or other patient management decisions. A negative result may occur with  improper specimen collection/handling, submission of specimen other than nasopharyngeal swab, presence of viral mutation(s) within the areas targeted by this assay, and inadequate number of viral copies(<138 copies/mL). A negative result must be combined with clinical observations, patient history, and epidemiological information. The expected result is Negative.  Fact Sheet for Patients:  EntrepreneurPulse.com.au  Fact Sheet for Healthcare Providers:  IncredibleEmployment.be  This test is no t yet approved or cleared by the [place] FDA and  has been authorized for detection and/or diagnosis of SARS-CoV-2 by FDA under an Emergency Use Authorization (EUA). This  EUA will remain  in effect (meaning this test can be used) for the duration of the COVID-19 declaration under Section 564(b)(1) of the Act, 21 U.S.C.section 360bbb-3(b)(1), unless the authorization is terminated  or revoked sooner.       Influenza A by PCR NEGATIVE NEGATIVE Final   Influenza B by PCR NEGATIVE NEGATIVE Final    Comment: (NOTE) The Xpert Xpress SARS-CoV-2/FLU/RSV plus assay is intended as an aid in the diagnosis of influenza from Nasopharyngeal swab specimens and should not be used as a sole basis for treatment. Nasal washings and aspirates are unacceptable for Xpert Xpress SARS-CoV-2/FLU/RSV testing.  Fact Sheet for Patients: EntrepreneurPulse.com.au  Fact Sheet for Healthcare Providers: IncredibleEmployment.be  This test is not yet approved or cleared by the [place] FDA and has been authorized for detection and/or diagnosis of SARS-CoV-2 by FDA under an Emergency Use Authorization (EUA). This EUA will remain in effect (meaning this test can be used) for the duration of the COVID-19 declaration under Section 564(b)(1) of the Act, 21 U.S.C. section 360bbb-3(b)(1), unless the authorization is terminated or revoked.  Performed at [facility], [address], [place], [place] [address]   MRSA PCR Screening     Status: None   Collection Time: 03/03/21  6:15 PM   Specimen: Nasopharyngeal  Result Value Ref Range Status   MRSA by PCR NEGATIVE NEGATIVE Final    Comment:        The GeneXpert MRSA Assay (FDA approved for NASAL specimens only), is one component of a comprehensive MRSA colonization surveillance program. It is not intended to diagnose MRSA infection nor to guide or monitor treatment for MRSA infections. Performed at [facility], [address], [place], [place] [address]    Time coordinating discharge: 40 mins   SIGNED:  Irwin Brakeman, MD  [facility] 03/09/2021, 10:23 AM How to contact the  [facility]  Attending or Consulting provider [place] or covering provider during after hours [place], for this patient?  Check the care team in [facility] and look for a) attending/consulting [facility] provider listed and b) the [facility] team listed Log into [email or web address] and use [employer]'s universal password to access. If you do not have the password, please contact the hospital operator. Locate the [facility] provider you are looking for under [facility] and page to a number that you can be directly reached. If you still have difficulty reaching the provider, please page the [facility] (Director on Call) for the Hospitalists listed on amion for assistance.

## 2021-03-09 NOTE — Progress Notes (Signed)
Report has been given to [place] Raquel Sarna). EMS has been called for transportation.

## 2021-04-02 ENCOUNTER — Ambulatory Visit (HOSPITAL_COMMUNITY): Payer: Medicare (Managed Care)

## 2021-04-09 ENCOUNTER — Ambulatory Visit (INDEPENDENT_AMBULATORY_CARE_PROVIDER_SITE_OTHER): Payer: Medicare (Managed Care) | Admitting: Urology

## 2021-04-09 ENCOUNTER — Ambulatory Visit (HOSPITAL_COMMUNITY)
Admission: RE | Admit: 2021-04-09 | Discharge: 2021-04-09 | Disposition: A | Discharge status: Home / Self Care | Payer: Medicare (Managed Care) | Source: Ambulatory Visit | Attending: Urology | Admitting: Urology

## 2021-04-09 ENCOUNTER — Other Ambulatory Visit: Payer: Self-pay

## 2021-04-09 ENCOUNTER — Encounter: Payer: Self-pay | Admitting: Urology

## 2021-04-09 VITALS — BP 103/67 | HR 105

## 2021-04-09 DIAGNOSIS — N201 Calculus of ureter: Secondary | ICD-10-CM | POA: Diagnosis present

## 2021-04-09 DIAGNOSIS — Z87440 Personal history of urinary (tract) infections: Secondary | ICD-10-CM

## 2021-04-09 DIAGNOSIS — E790 Hyperuricemia without signs of inflammatory arthritis and tophaceous disease: Secondary | ICD-10-CM | POA: Diagnosis not present

## 2021-04-09 DIAGNOSIS — N200 Calculus of kidney: Secondary | ICD-10-CM | POA: Diagnosis not present

## 2021-04-09 NOTE — Progress Notes (Signed)
Urological Symptom Review  Patient is experiencing the following symptoms: none   Review of Systems  Gastrointestinal (upper)  : Negative for upper GI symptoms  Gastrointestinal (lower) : Constipation  Constitutional : Weight loss  Skin: Negative for skin symptoms  Eyes: Negative for eye symptoms  Ear/Nose/Throat : Negative for Ear/Nose/Throat symptoms  Hematologic/Lymphatic: Negative for Hematologic/Lymphatic symptoms  Cardiovascular : Negative for cardiovascular symptoms  Respiratory : Negative for respiratory symptoms  Endocrine: Negative for endocrine symptoms  Musculoskeletal: Negative for musculoskeletal symptoms  Neurological: Negative for neurological symptoms  Psychologic: Negative for psychiatric symptoms

## 2021-04-09 NOTE — Progress Notes (Signed)
Subjective:  1. Renal stones   2. Hyperuricemia   3. Personal history of urinary infection      Mr. Wojciak returns today in f/u for his history of stones with right ureteroscopy in 1/22.  He had sepsis from a right UPJ stone and small renal stones. His stone was primarily uric acid.  He had a renal US today and he may have some small right renal stones but he has no hydro.  The report is pending.  He has no complaints but is unable to give a history.    He has been started on allopurinol 100mg  daily for the hyperuricemia found at his last visit.   ROS:  ROS:  A complete review of systems was performed.  All systems are negative except for pertinent findings as noted.   ROS  Allergies  Allergen Reactions   Hydrocodone    Hydrocodone-Acetaminophen     unknown   Aplisol [Tuberculin Ppd] Rash    Outpatient Encounter Medications as of 04/09/2021  Medication Sig   acetaminophen (TYLENOL) 650 MG CR tablet Take 1 tablet (650 mg total) by mouth every 8 (eight) hours as needed for pain or fever.   allopurinol (ZYLOPRIM) 100 MG tablet Take 100 mg by mouth daily.   atorvastatin (LIPITOR) 10 MG tablet Take 5 mg by mouth at bedtime.    cholecalciferol (VITAMIN D3) 25 MCG (1000 UNIT) tablet Take 1,000 Units by mouth daily.   clobetasol cream (TEMOVATE) 3.01 % Apply 1 application topically 2 (two) times daily.   Colloidal Oatmeal (EUCERIN ECZEMA RELIEF) 1 % CREA Apply 1 application topically 2 (two) times daily. Apply to face and neck   cycloSPORINE (RESTASIS) 0.05 % ophthalmic emulsion Place 1 drop into both eyes 2 (two) times daily.   feeding supplement (ENSURE ENLIVE / ENSURE PLUS) LIQD Take 237 mLs by mouth 2 (two) times daily between meals.   furosemide (LASIX) 20 MG tablet Take 1 tablet (20 mg total) by mouth every other day.   ketoconazole (NIZORAL) 2 % shampoo Apply 1 application topically every 12 (twelve) hours as needed for irritation. Apply to Scalp   metFORMIN (GLUCOPHAGE) 500 MG  tablet Take 500 mg by mouth at bedtime.   metoCLOPramide (REGLAN) 5 MG tablet Take 1 tablet (5 mg total) by mouth 3 (three) times daily before meals.   ondansetron (ZOFRAN) 4 MG tablet Take 4 mg by mouth every 6 (six) hours as needed for nausea or vomiting.   polyethylene glycol (MIRALAX / GLYCOLAX) 17 g packet Take 17 g by mouth daily as needed for mild constipation.   potassium chloride (KLOR-CON) 10 MEQ tablet Take 1 tablet (10 mEq total) by mouth every other day.   vitamin B-12 (CYANOCOBALAMIN) 1000 MCG tablet Take 1,000 mcg by mouth daily.   No facility-administered encounter medications on file as of 04/09/2021.    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   Dementia    disoriented to time.does not know medical hx.information obtained from previous charts   Diabetes mellitus without complication ([place])    GERD (gastroesophageal reflux disease)    Hard of hearing    Hyperlipemia    Hypertension    Iron deficiency anemia    Obesity    SOB (shortness of breath) on exertion     Past Surgical History:  Procedure Laterality Date  BIOPSY  05/09/2019   Procedure: BIOPSY;  Surgeon: Daneil Dolin, MD;  [facility];  Service: Endoscopy;;   COLONOSCOPY  10/06/06   internal hemorrhoids and anal papilla/poor prep   COLONOSCOPY N/A 05/09/2019   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 Biopsy with atrophic gastritis.    FOOT SURGERY     KNEE ARTHROSCOPY  06/17/2011   Procedure: ARTHROSCOPY KNEE;  Surgeon: Sanjuana Kava;  Location: [facility];  Service: Orthopedics;  Laterality: Right;   LAPAROSCOPIC PARTIAL COLECTOMY N/A 05/11/2019   Procedure: ATTEMPTED LAPAROSCOPIC PARTIAL COLECTOMY;  Surgeon: [name], MD;  Location: [facility];  Service: General;  Laterality: N/A;   PARTIAL COLECTOMY  05/11/2019   Procedure: PARTIAL COLECTOMY (Laparotomy incision at [place];  Surgeon: [name], MD;  Location: [facility];  Service: General;;   POLYPECTOMY  05/09/2019   Procedure: POLYPECTOMY;  Surgeon: Daneil Dolin, MD;  [facility];  Service: Endoscopy;;    Social History   Socioeconomic History   Marital status: Single    Spouse name: Not on file   Number of children: Not on file   Years of education: Not on file   Highest education level: Not on file  Occupational History   Not on file  Tobacco Use   Smoking status: Former    Packs/day: 1.00    Years: 30.00    Pack years: 30.00    Types: Cigarettes   Smokeless tobacco: Never   Tobacco comments:    quit a "long time ago"   Vaping Use   Vaping Use: Never used  Substance and Sexual Activity   Alcohol use: No   Drug use: No   Sexual activity: Never  Other Topics Concern   Not on file  Social History Narrative   Full care resident @ [facility]   Unable to provide own history.    Social Determinants of Health   Financial Resource Strain: Not on file  Food Insecurity: Not on file  Transportation Needs: Not on file  Physical Activity: Not on file  Stress: Not on file  Social Connections: Not on file  Intimate Partner Violence: Not on file    Family History  Problem Relation Age of Onset   COPD Mother    Cancer Brother    Anesthesia problems Neg Hx    Hypotension Neg Hx    Malignant hyperthermia Neg Hx    Pseudochol  deficiency Neg Hx    Colon cancer Neg Hx        Objective: Vitals:   04/09/21 1147  BP: 103/67  Pulse: (!) 105     Physical Exam  Lab Results:  PSA No results found for: PSA No results found for: TESTOSTERONE    Studies/Results:  I have reviewed the renal US films from today.  The final report is pending.    No results found for this or any previous visit.  No results found for this or any previous visit.  No results found for this or any previous visit.  No results found for this or any previous visit.  No results found for this or any previous visit.  No results found for this or any previous visit.  No results found for this or any previous visit.  No results found for this or any previous visit.    Assessment & Plan: Ureteral stone with sepsis:  He has no obstruction on [place] but a few small stones.   He will return in 1 year with a RUS.   Hyperuricemia.   He has been given allopurinol by his PCP.   No orders of the defined types were placed in this encounter.    Orders Placed This Encounter  Procedures   US RENAL    Standing Status:   Future    Standing Expiration Date:   04/09/2022    Order Specific Question:   Reason for Exam (SYMPTOM  OR DIAGNOSIS REQUIRED)    Answer:   renal stones    Order Specific Question:   Preferred imaging location?    Answer:   [facility]   Urinalysis, Routine w reflex microscopic      Return in about 1 year (around 04/09/2022) for with renal US.   CC: Caprice Renshaw, MD      Irine Seal 04/09/2021

## 2021-04-10 NOTE — Progress Notes (Signed)
Never received CBC or Lfts ordered at time of ov. Reviewed patient's records, in hospital since last seen here, with sepsis from UTI and aspiration PNA. Lfts now normal. Anemia, mild, stable. He has chronic respiratory failure, wears 3L oxygen chronically.   Consider hold off on colonoscopy due to poor/difficult prep, poor overall health. Await upcoming CT findings and then will discuss with Dr. Gala Romney and patient's legal guardian.

## 2021-05-15 ENCOUNTER — Ambulatory Visit (HOSPITAL_COMMUNITY): Payer: Medicare (Managed Care)

## 2021-05-15 ENCOUNTER — Inpatient Hospital Stay (HOSPITAL_COMMUNITY): Payer: Medicare (Managed Care) | Attending: Hematology

## 2021-05-21 ENCOUNTER — Ambulatory Visit (HOSPITAL_COMMUNITY): Payer: Medicare (Managed Care) | Admitting: Hematology

## 2021-05-28 ENCOUNTER — Emergency Department (HOSPITAL_COMMUNITY): Payer: Medicare (Managed Care)

## 2021-05-28 ENCOUNTER — Encounter (HOSPITAL_COMMUNITY): Payer: Self-pay | Admitting: Emergency Medicine

## 2021-05-28 ENCOUNTER — Inpatient Hospital Stay (HOSPITAL_COMMUNITY): Payer: Medicare (Managed Care)

## 2021-05-28 ENCOUNTER — Other Ambulatory Visit: Payer: Self-pay

## 2021-05-28 ENCOUNTER — Inpatient Hospital Stay (HOSPITAL_COMMUNITY)
Admission: EM | Admit: 2021-05-28 | Discharge: 2021-05-30 | DRG: 100 | Disposition: A | Discharge status: Skilled Nursing Facility | Payer: Medicare (Managed Care) | Source: Skilled Nursing Facility | Attending: Family Medicine | Admitting: Family Medicine

## 2021-05-28 DIAGNOSIS — R569 Unspecified convulsions: Secondary | ICD-10-CM | POA: Diagnosis present

## 2021-05-28 DIAGNOSIS — K2970 Gastritis, unspecified, without bleeding: Secondary | ICD-10-CM | POA: Diagnosis present

## 2021-05-28 DIAGNOSIS — Z885 Allergy status to narcotic agent status: Secondary | ICD-10-CM | POA: Diagnosis not present

## 2021-05-28 DIAGNOSIS — H7091 Unspecified mastoiditis, right ear: Secondary | ICD-10-CM | POA: Diagnosis present

## 2021-05-28 DIAGNOSIS — F0280 Dementia in other diseases classified elsewhere without behavioral disturbance: Secondary | ICD-10-CM | POA: Diagnosis present

## 2021-05-28 DIAGNOSIS — K219 Gastro-esophageal reflux disease without esophagitis: Secondary | ICD-10-CM | POA: Diagnosis present

## 2021-05-28 DIAGNOSIS — N390 Urinary tract infection, site not specified: Secondary | ICD-10-CM | POA: Diagnosis present

## 2021-05-28 DIAGNOSIS — Z87891 Personal history of nicotine dependence: Secondary | ICD-10-CM | POA: Diagnosis not present

## 2021-05-28 DIAGNOSIS — D500 Iron deficiency anemia secondary to blood loss (chronic): Secondary | ICD-10-CM | POA: Diagnosis present

## 2021-05-28 DIAGNOSIS — R404 Transient alteration of awareness: Secondary | ICD-10-CM

## 2021-05-28 DIAGNOSIS — I10 Essential (primary) hypertension: Secondary | ICD-10-CM | POA: Diagnosis present

## 2021-05-28 DIAGNOSIS — Z888 Allergy status to other drugs, medicaments and biological substances status: Secondary | ICD-10-CM

## 2021-05-28 DIAGNOSIS — F0390 Unspecified dementia without behavioral disturbance: Secondary | ICD-10-CM | POA: Diagnosis not present

## 2021-05-28 DIAGNOSIS — L8990 Pressure ulcer of unspecified site, unspecified stage: Secondary | ICD-10-CM | POA: Diagnosis present

## 2021-05-28 DIAGNOSIS — Z79899 Other long term (current) drug therapy: Secondary | ICD-10-CM | POA: Diagnosis not present

## 2021-05-28 DIAGNOSIS — E119 Type 2 diabetes mellitus without complications: Secondary | ICD-10-CM | POA: Diagnosis present

## 2021-05-28 DIAGNOSIS — E785 Hyperlipidemia, unspecified: Secondary | ICD-10-CM | POA: Diagnosis present

## 2021-05-28 DIAGNOSIS — Z20822 Contact with and (suspected) exposure to covid-19: Secondary | ICD-10-CM | POA: Diagnosis present

## 2021-05-28 DIAGNOSIS — E538 Deficiency of other specified B group vitamins: Secondary | ICD-10-CM | POA: Diagnosis present

## 2021-05-28 DIAGNOSIS — R5381 Other malaise: Secondary | ICD-10-CM | POA: Diagnosis present

## 2021-05-28 DIAGNOSIS — E669 Obesity, unspecified: Secondary | ICD-10-CM | POA: Diagnosis present

## 2021-05-28 DIAGNOSIS — R4781 Slurred speech: Secondary | ICD-10-CM

## 2021-05-28 DIAGNOSIS — Z85038 Personal history of other malignant neoplasm of large intestine: Secondary | ICD-10-CM | POA: Diagnosis not present

## 2021-05-28 DIAGNOSIS — G20 Parkinson's disease: Secondary | ICD-10-CM | POA: Diagnosis present

## 2021-05-28 DIAGNOSIS — R9431 Abnormal electrocardiogram [ECG] [EKG]: Secondary | ICD-10-CM | POA: Diagnosis present

## 2021-05-28 DIAGNOSIS — Z8673 Personal history of transient ischemic attack (TIA), and cerebral infarction without residual deficits: Secondary | ICD-10-CM

## 2021-05-28 DIAGNOSIS — Z6831 Body mass index (BMI) 31.0-31.9, adult: Secondary | ICD-10-CM

## 2021-05-28 DIAGNOSIS — Z809 Family history of malignant neoplasm, unspecified: Secondary | ICD-10-CM

## 2021-05-28 DIAGNOSIS — Z66 Do not resuscitate: Secondary | ICD-10-CM | POA: Diagnosis present

## 2021-05-28 DIAGNOSIS — Z7401 Bed confinement status: Secondary | ICD-10-CM

## 2021-05-28 DIAGNOSIS — G9341 Metabolic encephalopathy: Secondary | ICD-10-CM | POA: Diagnosis present

## 2021-05-28 DIAGNOSIS — R4182 Altered mental status, unspecified: Secondary | ICD-10-CM

## 2021-05-28 DIAGNOSIS — M21372 Foot drop, left foot: Secondary | ICD-10-CM | POA: Diagnosis present

## 2021-05-28 DIAGNOSIS — Z7984 Long term (current) use of oral hypoglycemic drugs: Secondary | ICD-10-CM | POA: Diagnosis not present

## 2021-05-28 DIAGNOSIS — H9190 Unspecified hearing loss, unspecified ear: Secondary | ICD-10-CM | POA: Diagnosis present

## 2021-05-28 LAB — MAGNESIUM: Magnesium: 1.9 mg/dL (ref 1.7–2.4)

## 2021-05-28 LAB — DIFFERENTIAL
Abs Immature Granulocytes: 0.01 10*3/uL (ref 0.00–0.07)
Basophils Absolute: 0 10*3/uL (ref 0.0–0.1)
Basophils Relative: 1 %
Eosinophils Absolute: 0.2 10*3/uL (ref 0.0–0.5)
Eosinophils Relative: 3 %
Immature Granulocytes: 0 %
Lymphocytes Relative: 35 %
Lymphs Abs: 2 10*3/uL (ref 0.7–4.0)
Monocytes Absolute: 0.5 10*3/uL (ref 0.1–1.0)
Monocytes Relative: 9 %
Neutro Abs: 2.9 10*3/uL (ref 1.7–7.7)
Neutrophils Relative %: 52 %

## 2021-05-28 LAB — CBC
HCT: 38.9 % — ABNORMAL LOW (ref 39.0–52.0)
Hemoglobin: 12.7 g/dL — ABNORMAL LOW (ref 13.0–17.0)
MCH: 29.3 pg (ref 26.0–34.0)
MCHC: 32.6 g/dL (ref 30.0–36.0)
MCV: 89.6 fL (ref 80.0–100.0)
Platelets: 168 10*3/uL (ref 150–400)
RBC: 4.34 MIL/uL (ref 4.22–5.81)
RDW: 13.1 % (ref 11.5–15.5)
WBC: 5.6 10*3/uL (ref 4.0–10.5)
nRBC: 0 % (ref 0.0–0.2)

## 2021-05-28 LAB — COMPREHENSIVE METABOLIC PANEL
ALT: 25 U/L (ref 0–44)
AST: 25 U/L (ref 15–41)
Albumin: 3.7 g/dL (ref 3.5–5.0)
Alkaline Phosphatase: 110 U/L (ref 38–126)
Anion gap: 6 (ref 5–15)
BUN: 16 mg/dL (ref 8–23)
CO2: 29 mmol/L (ref 22–32)
Calcium: 9.1 mg/dL (ref 8.9–10.3)
Chloride: 100 mmol/L (ref 98–111)
Creatinine, Ser: 0.78 mg/dL (ref 0.61–1.24)
GFR, Estimated: >60 mL/min (ref >60)
Glucose, Bld: 105 mg/dL — ABNORMAL HIGH (ref 70–99)
Potassium: 3.8 mmol/L (ref 3.5–5.1)
Sodium: 135 mmol/L (ref 135–145)
Total Bilirubin: 0.5 mg/dL (ref 0.3–1.2)
Total Protein: 7.2 g/dL (ref 6.5–8.1)

## 2021-05-28 LAB — PROTIME-INR
INR: 1.1 (ref 0.8–1.2)
Prothrombin Time: 13.7 seconds (ref 11.4–15.2)

## 2021-05-28 LAB — RESP PANEL BY RT-PCR (FLU A&B, COVID) ARPGX2
Influenza A by PCR: NEGATIVE
Influenza B by PCR: NEGATIVE
SARS Coronavirus 2 by RT PCR: NEGATIVE

## 2021-05-28 LAB — I-STAT CHEM 8, ED
BUN: 16 mg/dL (ref 8–23)
Calcium, Ion: 1.17 mmol/L (ref 1.15–1.40)
Chloride: 99 mmol/L (ref 98–111)
Creatinine, Ser: 0.8 mg/dL (ref 0.61–1.24)
Glucose, Bld: 104 mg/dL — ABNORMAL HIGH (ref 70–99)
HCT: 39 % (ref 39.0–52.0)
Hemoglobin: 13.3 g/dL (ref 13.0–17.0)
Potassium: 4.1 mmol/L (ref 3.5–5.1)
Sodium: 139 mmol/L (ref 135–145)
TCO2: 30 mmol/L (ref 22–32)

## 2021-05-28 LAB — BLOOD GAS, VENOUS
Acid-Base Excess: 6.3 mmol/L — ABNORMAL HIGH (ref 0.0–2.0)
Bicarbonate: 27.8 mmol/L (ref 20.0–28.0)
FIO2: 21
O2 Saturation: 31.6 %
Patient temperature: 37.1
pCO2, Ven: 51.9 mmHg (ref 44.0–60.0)
pH, Ven: 7.395 (ref 7.250–7.430)
pO2, Ven: <31 mmHg — CL (ref 32.0–45.0)

## 2021-05-28 LAB — CBG MONITORING, ED
Glucose-Capillary: 108 mg/dL — ABNORMAL HIGH (ref 70–99)
Glucose-Capillary: 103 mg/dL — ABNORMAL HIGH (ref 70–99)

## 2021-05-28 LAB — APTT: aPTT: 25 seconds (ref 24–36)

## 2021-05-28 LAB — HEMOGLOBIN A1C
Hgb A1c MFr Bld: 5.6 % (ref 4.8–5.6)
Mean Plasma Glucose: 114.02 mg/dL

## 2021-05-28 LAB — GLUCOSE, CAPILLARY: Glucose-Capillary: 100 mg/dL — ABNORMAL HIGH (ref 70–99)

## 2021-05-28 LAB — ETHANOL: Alcohol, Ethyl (B): <10 mg/dL (ref <10)

## 2021-05-28 LAB — PHOSPHORUS: Phosphorus: 3.2 mg/dL (ref 2.5–4.6)

## 2021-05-28 MED ORDER — ALLOPURINOL 100 MG PO TABS
100.0000 mg | ORAL_TABLET | Freq: Every day | ORAL | Status: DC
  Administered 2021-05-29 – 2021-05-30 (×2): 100 mg via ORAL
  Filled 2021-05-28 (×2): qty 1

## 2021-05-28 MED ORDER — COLLOIDAL OATMEAL 1 % EX CREA: 1.0000 "application " | TOPICAL_CREAM | Freq: Two times a day (BID) | CUTANEOUS | Status: DC

## 2021-05-28 MED ORDER — INSULIN ASPART 100 UNIT/ML IJ SOLN: 0.0000 [IU] | Freq: Three times a day (TID) | INTRAMUSCULAR | Status: DC

## 2021-05-28 MED ORDER — SODIUM CHLORIDE 0.9 % IV SOLN: INTRAVENOUS | Status: AC

## 2021-05-28 MED ORDER — LEVETIRACETAM IN NACL 500 MG/100ML IV SOLN
500.0000 mg | Freq: Two times a day (BID) | INTRAVENOUS | Status: DC
  Administered 2021-05-28 – 2021-05-29 (×3): 500 mg via INTRAVENOUS
  Filled 2021-05-28 (×4): qty 100

## 2021-05-28 MED ORDER — VITAMIN D 25 MCG (1000 UNIT) PO TABS: 1000.0000 [IU] | ORAL_TABLET | Freq: Every day | ORAL | Status: DC

## 2021-05-28 MED ORDER — METOPROLOL TARTRATE 5 MG/5ML IV SOLN: 5.0000 mg | Freq: Four times a day (QID) | INTRAVENOUS | Status: DC | PRN

## 2021-05-28 MED ORDER — CLOBETASOL PROPIONATE 0.05 % EX CREA: 1.0000 "application " | TOPICAL_CREAM | Freq: Two times a day (BID) | CUTANEOUS | Status: DC

## 2021-05-28 MED ORDER — LEVETIRACETAM IN NACL 1500 MG/100ML IV SOLN
1500.0000 mg | Freq: Once | INTRAVENOUS | Status: AC
  Administered 2021-05-28: 1500 mg via INTRAVENOUS
  Filled 2021-05-28: qty 100

## 2021-05-28 MED ORDER — CYCLOSPORINE 0.05 % OP EMUL
1.0000 [drp] | Freq: Two times a day (BID) | OPHTHALMIC | Status: DC
  Administered 2021-05-28 – 2021-05-30 (×4): 1 [drp] via OPHTHALMIC
  Filled 2021-05-28 (×4): qty 1

## 2021-05-28 MED ORDER — ACETAMINOPHEN 650 MG RE SUPP: 650.0000 mg | Freq: Four times a day (QID) | RECTAL | Status: DC | PRN

## 2021-05-28 MED ORDER — KETOCONAZOLE 2 % EX SHAM
1.0000 "application " | MEDICATED_SHAMPOO | Freq: Two times a day (BID) | CUTANEOUS | Status: DC | PRN
  Filled 2021-05-28: qty 120

## 2021-05-28 MED ORDER — ATORVASTATIN CALCIUM 10 MG PO TABS
5.0000 mg | ORAL_TABLET | Freq: Every day | ORAL | Status: DC
  Administered 2021-05-28 – 2021-05-29 (×2): 5 mg via ORAL
  Filled 2021-05-28 (×2): qty 1

## 2021-05-28 MED ORDER — FUROSEMIDE 20 MG PO TABS
20.0000 mg | ORAL_TABLET | ORAL | Status: DC
  Administered 2021-05-30: 20 mg via ORAL
  Filled 2021-05-28: qty 1

## 2021-05-28 MED ORDER — POTASSIUM CHLORIDE CRYS ER 10 MEQ PO TBCR
10.0000 meq | EXTENDED_RELEASE_TABLET | ORAL | Status: DC
  Administered 2021-05-30: 10 meq via ORAL
  Filled 2021-05-28: qty 1

## 2021-05-28 MED ORDER — ONDANSETRON HCL 4 MG PO TABS: 4.0000 mg | ORAL_TABLET | Freq: Four times a day (QID) | ORAL | Status: DC | PRN

## 2021-05-28 MED ORDER — SODIUM CHLORIDE 0.9 % IV SOLN
2.0000 g | Freq: Three times a day (TID) | INTRAVENOUS | Status: DC
  Administered 2021-05-28: 2 g via INTRAVENOUS
  Filled 2021-05-28: qty 2

## 2021-05-28 MED ORDER — SORBITOL 70 % SOLN
30.0000 mL | Freq: Every day | Status: DC | PRN
  Filled 2021-05-28: qty 30

## 2021-05-28 MED ORDER — ADULT MULTIVITAMIN W/MINERALS CH
1.0000 | ORAL_TABLET | Freq: Every day | ORAL | Status: DC
  Administered 2021-05-29 – 2021-05-30 (×2): 1 via ORAL
  Filled 2021-05-28 (×2): qty 1

## 2021-05-28 MED ORDER — ENOXAPARIN SODIUM 40 MG/0.4ML IJ SOSY
40.0000 mg | PREFILLED_SYRINGE | INTRAMUSCULAR | Status: DC
  Administered 2021-05-28 – 2021-05-29 (×2): 40 mg via SUBCUTANEOUS
  Filled 2021-05-28 (×2): qty 0.4

## 2021-05-28 MED ORDER — SODIUM CHLORIDE 0.9% FLUSH
3.0000 mL | Freq: Two times a day (BID) | INTRAVENOUS | Status: DC
  Administered 2021-05-28 – 2021-05-30 (×4): 3 mL via INTRAVENOUS

## 2021-05-28 MED ORDER — ONDANSETRON HCL 4 MG/2ML IJ SOLN: 4.0000 mg | Freq: Four times a day (QID) | INTRAMUSCULAR | Status: DC | PRN

## 2021-05-28 MED ORDER — POLYETHYLENE GLYCOL 3350 17 G PO PACK: 17.0000 g | PACK | Freq: Every day | ORAL | Status: DC | PRN

## 2021-05-28 MED ORDER — ASCORBIC ACID 500 MG PO TABS
250.0000 mg | ORAL_TABLET | Freq: Every day | ORAL | Status: DC
  Administered 2021-05-29 – 2021-05-30 (×2): 250 mg via ORAL
  Filled 2021-05-28 (×2): qty 1

## 2021-05-28 MED ORDER — LORAZEPAM 2 MG/ML IJ SOLN: 1.0000 mg | INTRAMUSCULAR | Status: DC | PRN

## 2021-05-28 MED ORDER — ACETAMINOPHEN 325 MG PO TABS: 650.0000 mg | ORAL_TABLET | Freq: Four times a day (QID) | ORAL | Status: DC | PRN

## 2021-05-28 MED ORDER — VITAMIN B-12 1000 MCG PO TABS
1000.0000 ug | ORAL_TABLET | Freq: Every day | ORAL | Status: DC
  Administered 2021-05-29 – 2021-05-30 (×2): 1000 ug via ORAL
  Filled 2021-05-28 (×2): qty 1

## 2021-05-28 NOTE — Consult Note (Addendum)
Oyens A. Merlene Laughter, MD     [email or web address]          [name] is an 75 y.o. male.   ASSESSMENT/PLAN:  ACUTE ENCEPHALOPATHY OF UNCLEAR ETIOLOGY: The typical toxic metabolic is still being worked up including urinalysis. The patient does appear to be moderately parkinsonian with significant bradykinesia which could explain the cephalopathy and impaired movement in part. This is most likely due to metoclopramide. This medication will be discontinued for now. The patient has been started on Keppra. An EEG is being obtained to evaluate with a needs to be on seizure medication long-term.  Remote multiple lacunar infarcts on imaging: Aspirin 325 mg is recommended.  Dementia at baseline     The patient presents with acute mental status changes especially with unresponsiveness and staring. He has a baseline history of dementia and is staying at the nursing home facility.  However, it appears that he had significant changes over baseline. The patient is not able to provide the history.  GENERAL:  The patient is cooperates with evaluation. He has significant psychomotor slowing.  HEENT:  Neck is supple no trauma noted.  ABDOMEN: soft  EXTREMITIES: No edema;  There is a brace on the left knee. There is diffuse arthritic changes noted throughout. There is a left footdrop.   BACK: Normal  SKIN: Normal by inspection.    MENTAL STATUS:  He is awake and cooperates with evaluation. He does follow commands briskly in the midline and the in the extremities. He recognizes that he is that the hospital and names that appropriately. He is not oriented to time or to his medical condition.  CRANIAL NERVES: Pupils are equal, round and reactive to light and accomodation; extra ocular movements are full, there is no significant nystagmus; visual fields are full; upper and lower facial muscles are normal in strength and symmetric, there is no flattening of the nasolabial folds; tongue  is midline; Visual fields are intact.  MOTOR:  He has antigravity strength throughout.  COORDINATION:  No tremors are noted. He however has moderate to severe bradykinesia. There is some cogwheeling noted. No mild clonus, negative myoclonus or dyskinesias are noted.  REFLEXES: Deep tendon reflexes are symmetrical and normal.  SENSATION: Normal to pain.     Blood pressure 136/85, pulse (!) 105, temperature 98.5 F (36.9 C), temperature source Rectal, resp. rate 18, height 6' (1.829 m), SpO2 97 %.  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 Biopsy with atrophic gastritis.    FOOT SURGERY     KNEE  ARTHROSCOPY  06/17/2011   Procedure: ARTHROSCOPY KNEE;  Surgeon: Sanjuana Kava;  Location: [facility];  Service: Orthopedics;  Laterality: Right;   LAPAROSCOPIC PARTIAL COLECTOMY N/A 05/11/2019   Procedure: ATTEMPTED LAPAROSCOPIC PARTIAL COLECTOMY;  Surgeon: [name], MD;  Location: [facility];  Service: General;  Laterality: N/A;   PARTIAL COLECTOMY  05/11/2019   Procedure: PARTIAL COLECTOMY (Laparotomy incision at [place];  Surgeon: [name], MD;  Location: [facility];  Service: General;;   POLYPECTOMY  05/09/2019   Procedure: POLYPECTOMY;  Surgeon: Daneil Dolin, MD;  [facility];  Service: Endoscopy;;    Family History  Problem Relation Age of Onset   COPD Mother    Cancer Brother    Anesthesia problems Neg Hx    Hypotension Neg Hx    Malignant hyperthermia Neg Hx    Pseudochol deficiency Neg Hx    Colon cancer Neg Hx     Social History:  reports that he has quit smoking. His smoking use included cigarettes. He has a 30.00 pack-year smoking history. He has never used smokeless tobacco. He reports that he does not drink alcohol and does not use drugs.  Allergies:  Allergies  Allergen Reactions   Hydrocodone    Hydrocodone-Acetaminophen     unknown   Aplisol [Tuberculin Ppd] Rash    Medications: Prior to Admission medications   Medication Sig Start Date End Date Taking? Authorizing Provider  allopurinol (ZYLOPRIM) 100 MG tablet Take 100 mg by mouth daily.   Yes [provider]  Ascorbic Acid (VITAMIN C PO) Take 1 tablet by mouth daily.   Yes [provider]  atorvastatin (LIPITOR) 10 MG tablet Take 5 mg by mouth at bedtime.    Yes [provider]  feeding supplement (ENSURE ENLIVE / ENSURE PLUS) LIQD Take 237 mLs by mouth 2 (two) times daily between meals. 09/11/20  Yes Hongalgi, Lenis Dickinson, MD  furosemide (LASIX) 20 MG tablet Take 1 tablet (20 mg total) by mouth every other day. 03/11/21  Yes Johnson, Clanford L, MD  metFORMIN  (GLUCOPHAGE) 500 MG tablet Take 500 mg by mouth at bedtime.   Yes [provider]  Multiple Vitamin (MULTIVITAMIN) tablet Take 1 tablet by mouth daily.   Yes [provider]  polyethylene glycol (MIRALAX / GLYCOLAX) 17 g packet Take 17 g by mouth daily as needed for mild constipation. 03/09/21  Yes Johnson, Clanford L, MD  potassium chloride (KLOR-CON) 10 MEQ tablet Take 1 tablet (10 mEq total) by mouth every other day. 03/11/21  Yes Johnson, Clanford L, MD  acetaminophen (TYLENOL) 650 MG CR tablet Take 1 tablet (650 mg total) by mouth every 8 (eight) hours as needed for pain or fever. 09/11/20   Hongalgi, Lenis Dickinson, MD  cholecalciferol (VITAMIN D3) 25 MCG (1000 UNIT) tablet Take 1,000 Units by mouth daily.    [provider]  clobetasol cream (TEMOVATE) 3.55 % Apply 1 application topically 2 (two) times daily.    [provider]  Colloidal Oatmeal (EUCERIN ECZEMA RELIEF) 1 % CREA Apply 1 application topically 2 (two) times daily. Apply to face  and neck    [provider]  cycloSPORINE (RESTASIS) 0.05 % ophthalmic emulsion Place 1 drop into both eyes 2 (two) times daily.    [provider]  ketoconazole (NIZORAL) 2 % shampoo Apply 1 application topically every 12 (twelve) hours as needed for irritation. Apply to Scalp    [provider]  metoCLOPramide (REGLAN) 5 MG tablet Take 1 tablet (5 mg total) by mouth 3 (three) times daily before meals. 03/09/21   Johnson, Clanford L, MD  ondansetron (ZOFRAN) 4 MG tablet Take 4 mg by mouth every 6 (six) hours as needed for nausea or vomiting.    [provider]  vitamin B-12 (CYANOCOBALAMIN) 1000 MCG tablet Take 1,000 mcg by mouth daily.    [provider]    Scheduled Meds:  [START ON 05/29/2021] allopurinol  100 mg Oral Daily   [START ON 05/29/2021] vitamin C  250 mg Oral Daily   atorvastatin  5 mg Oral QHS   clobetasol cream  1 application Topical BID   Colloidal Oatmeal  1  application Topical BID   cycloSPORINE  1 drop Both Eyes BID   enoxaparin (LOVENOX) injection  40 mg Subcutaneous Q24H   [START ON 05/30/2021] furosemide  20 mg Oral QODAY   insulin aspart  0-9 Units Subcutaneous TID WC   [START ON 05/29/2021] multivitamin with minerals  1 tablet Oral Daily   [START ON 05/30/2021] potassium chloride  10 mEq Oral QODAY   sodium chloride flush  3 mL Intravenous Q12H   vitamin B-12  1,000 mcg Oral Daily   Continuous Infusions:  sodium chloride 75 mL/hr at 05/28/21 1745   ceFEPime (MAXIPIME) IV Stopped (05/28/21 1858)   levETIRAcetam     PRN Meds:.acetaminophen **OR** acetaminophen, ketoconazole, LORazepam, metoprolol tartrate, ondansetron **OR** ondansetron (ZOFRAN) IV, polyethylene glycol, polyethylene glycol, sorbitol     Results for orders placed or performed during the hospital encounter of 05/28/21 (from the past 48 hour(s))  CBG monitoring, ED     Status: Abnormal   Collection Time: 05/28/21 11:44 AM  Result Value Ref Range   Glucose-Capillary 108 (H) 70 - 99 mg/dL    Comment: Glucose reference range applies only to samples taken after fasting for at least 8 hours.  I-stat chem 8, ED     Status: Abnormal   Collection Time: 05/28/21 11:47 AM  Result Value Ref Range   Sodium 139 135 - 145 mmol/L   Potassium 4.1 3.5 - 5.1 mmol/L   Chloride 99 98 - 111 mmol/L   BUN 16 8 - 23 mg/dL   Creatinine, Ser 0.80 0.61 - 1.24 mg/dL   Glucose, Bld 104 (H) 70 - 99 mg/dL    Comment: Glucose reference range applies only to samples taken after fasting for at least 8 hours.   Calcium, Ion 1.17 1.15 - 1.40 mmol/L   TCO2 30 22 - 32 mmol/L   Hemoglobin 13.3 13.0 - 17.0 g/dL   HCT 39.0 39.0 - 52.0 %  Ethanol     Status: None   Collection Time: 05/28/21 11:47 AM  Result Value Ref Range   Alcohol, Ethyl (B) <10 <10 mg/dL    Comment: (NOTE) Lowest detectable limit for serum alcohol is 10 mg/dL.  For medical purposes only. Performed at [facility], [address] [place], [place] [address]   Protime-INR     Status: None   Collection Time: 05/28/21 11:47 AM  Result Value Ref Range   Prothrombin Time 13.7 11.4 - 15.2 seconds  INR 1.1 0.8 - 1.2    Comment: (NOTE) INR goal varies based on device and disease states. Performed at [facility], [address], [place], [place] [address]   APTT     Status: None   Collection Time: 05/28/21 11:47 AM  Result Value Ref Range   aPTT 25 24 - 36 seconds    Comment: Performed at [facility], [address] [place] [address] [place], [place] [address]  CBC     Status: Abnormal   Collection Time: 05/28/21 11:47 AM  Result Value Ref Range   WBC 5.6 4.0 - 10.5 K/uL   RBC 4.34 4.22 - 5.81 MIL/uL   Hemoglobin 12.7 (L) 13.0 - 17.0 g/dL   HCT 38.9 (L) 39.0 - 52.0 %   MCV 89.6 80.0 - 100.0 fL   MCH 29.3 26.0 - 34.0 pg   MCHC 32.6 30.0 - 36.0 g/dL   RDW 13.1 11.5 - 15.5 %   Platelets 168 150 - 400 K/uL   nRBC 0.0 0.0 - 0.2 %    Comment: Performed at [facility], [address] [place], [place] [address]  Differential     Status: None   Collection Time: 05/28/21 11:47 AM  Result Value Ref Range   Neutrophils Relative % 52 %   Neutro Abs 2.9 1.7 - 7.7 K/uL   Lymphocytes Relative 35 %   Lymphs Abs 2.0 0.7 - 4.0 K/uL   Monocytes Relative 9 %   Monocytes Absolute 0.5 0.1 - 1.0 K/uL   Eosinophils Relative 3 %   Eosinophils Absolute 0.2 0.0 - 0.5 K/uL   Basophils Relative 1 %   Basophils Absolute 0.0 0.0 - 0.1 K/uL   Immature Granulocytes 0 %   Abs Immature Granulocytes 0.01 0.00 - 0.07 K/uL    Comment: Performed at [facility], [address], [place], [place] [address]  Comprehensive metabolic panel     Status: Abnormal   Collection Time: 05/28/21 11:47 AM  Result Value Ref Range   Sodium 135 135 - 145 mmol/L   Potassium 3.8 3.5 - 5.1 mmol/L   Chloride 100 98 - 111 mmol/L   CO2 29 22 - 32 mmol/L   Glucose, Bld 105 (H) 70 - 99 mg/dL    Comment: Glucose reference range applies only to samples taken after fasting for at  least 8 hours.   BUN 16 8 - 23 mg/dL   Creatinine, Ser 0.78 0.61 - 1.24 mg/dL   Calcium 9.1 8.9 - 10.3 mg/dL   Total Protein 7.2 6.5 - 8.1 g/dL   Albumin 3.7 3.5 - 5.0 g/dL   AST 25 15 - 41 U/L   ALT 25 0 - 44 U/L   Alkaline Phosphatase 110 38 - 126 U/L   Total Bilirubin 0.5 0.3 - 1.2 mg/dL   GFR, Estimated >60 >60 mL/min    Comment: (NOTE) Calculated using the CKD-EPI Creatinine Equation (2021)    Anion gap 6 5 - 15    Comment: Performed at [facility], [address], [place], [place] [address]  Magnesium     Status: None   Collection Time: 05/28/21 11:47 AM  Result Value Ref Range   Magnesium 1.9 1.7 - 2.4 mg/dL    Comment: Performed at [facility], [address], [place], [place] [address]  Phosphorus     Status: None   Collection Time: 05/28/21 11:47 AM  Result Value Ref Range   Phosphorus 3.2 2.5 - 4.6 mg/dL    Comment: Performed at [facility], [address], [place], [place] [address]  Resp Panel by RT-PCR (Flu A&B, Covid) Nasopharyngeal Swab     Status: None   Collection Time: 05/28/21 12:08 PM   Specimen: Nasopharyngeal Swab; Nasopharyngeal(NP) swabs in vial transport medium  Result Value Ref Range   SARS Coronavirus 2 by RT PCR NEGATIVE NEGATIVE    Comment: (NOTE) SARS-CoV-2 target nucleic acids are NOT DETECTED.  The SARS-CoV-2 RNA is generally detectable in upper respiratory specimens during the acute phase of infection. The lowest concentration of SARS-CoV-2 viral copies this assay can detect is 138 copies/mL. A negative result does not preclude SARS-Cov-2 infection and should not be used as the sole basis for treatment or other patient management decisions. A negative result may occur with  improper specimen collection/handling, submission of specimen other than nasopharyngeal swab, presence of viral mutation(s) within the areas targeted by this assay, and inadequate number of viral copies(<138 copies/mL). A negative result must be combined with clinical  observations, patient history, and epidemiological information. The expected result is Negative.  Fact Sheet for Patients:  EntrepreneurPulse.com.au  Fact Sheet for Healthcare Providers:  IncredibleEmployment.be  This test is no t yet approved or cleared by the [place] FDA and  has been authorized for detection and/or diagnosis of SARS-CoV-2 by FDA under an Emergency Use Authorization (EUA). This EUA will remain  in effect (meaning this test can be used) for the duration of the COVID-19 declaration under Section 564(b)(1) of the Act, 21 U.S.C.section 360bbb-3(b)(1), unless the authorization is terminated  or revoked sooner.       Influenza A by PCR NEGATIVE NEGATIVE   Influenza B by PCR NEGATIVE NEGATIVE    Comment: (NOTE) The Xpert Xpress SARS-CoV-2/FLU/RSV plus assay is intended as an aid in the diagnosis of influenza from Nasopharyngeal swab specimens and should not be used as a sole basis for treatment. Nasal washings and aspirates are unacceptable for Xpert Xpress SARS-CoV-2/FLU/RSV testing.  Fact Sheet for Patients: EntrepreneurPulse.com.au  Fact Sheet for Healthcare Providers: IncredibleEmployment.be  This test is not yet approved or cleared by the [place] FDA and has been authorized for detection and/or diagnosis of SARS-CoV-2 by FDA under an Emergency Use Authorization (EUA). This EUA will remain in effect (meaning this test can be used) for the duration of the COVID-19 declaration under Section 564(b)(1) of the Act, 21 U.S.C. section 360bbb-3(b)(1), unless the authorization is terminated or revoked.  Performed at [facility], [address], [place], [place] [address]   Blood gas, venous (at [facility] and [facility], not at [facility])     Status: Abnormal   Collection Time: 05/28/21 12:34 PM  Result Value Ref Range   FIO2 21.00    pH, Ven 7.395 7.250 - 7.430   pCO2, Ven 51.9 44.0 - 60.0 mmHg    pO2, Ven <31.0 (LL) 32.0 - 45.0 mmHg    Comment: CRITICAL RESULT CALLED TO, READ BACK BY AND VERIFIED WITH: DAVIS,N AT 1300 ON 9.1.22 BY RUCINSKI,B    Bicarbonate 27.8 20.0 - 28.0 mmol/L   Acid-Base Excess 6.3 (H) 0.0 - 2.0 mmol/L   O2 Saturation 31.6 %   Patient temperature 37.1     Comment: Performed at [facility], [address], [place], [place] [address]  CBG monitoring, ED     Status: Abnormal   Collection Time: 05/28/21  5:50 PM  Result Value Ref Range   Glucose-Capillary 103 (H) 70 - 99 mg/dL    Comment: Glucose reference range applies only to samples taken after  fasting for at least 8 hours.    Studies/Results:     HEAD CT FINDINGS: The study is mildly motion degraded.   Brain: There is no evidence of an acute infarct, intracranial hemorrhage, mass, midline shift, or extra-axial fluid collection. Mild trace diffusion weighted signal hyperintensity involving bilateral cerebral hemispheric white matter extending along the corticospinal tracts through the posterior limbs of the internal capsules and into the midbrain as well as in the splenium of the corpus callosum is chronic based on the prior MRI. There is also mild diffusion weighted signal abnormality in the superior and middle cerebellar peduncles.   Susceptibility artifact in the basal ganglia and white matter correspond to longstanding calcifications on CT. Patchy T2 hyperintensities in the cerebral white matter bilaterally are nonspecific but compatible with mild-to-moderate chronic small vessel ischemic disease. The ventricles and sulci are within normal limits for age.   Vascular: Major intracranial vascular flow voids are preserved.   Skull and upper cervical spine: No suspicious marrow lesion.   Sinuses/Orbits: Bilateral cataract extraction. Clear paranasal sinuses. Large right mastoid and middle ear effusions.   Other: None.   IMPRESSION: 1. No acute intracranial abnormality. 2.  Mild-to-moderate chronic small vessel ischemic disease. 3. Large right mastoid and middle ear effusions.    Brain MRI is reviewed in person and shows lacunar infarcts involving the mid brain, basal ganglia and centrum semiovale bilaterally especially on the left side. There is moderate chronic microvascular changes involving the white matter. No hemorrhages noted.   Kaliah Haddaway A. Merlene Laughter, M.D.  Diplomate, Tax adviser of Psychiatry and Neurology ( Neurology). 05/28/2021, 7:26 PM

## 2021-05-28 NOTE — ED Triage Notes (Signed)
Pt arrived from [facility] for ams and slurred speech. [facility] [id number] when PA at facility was doing an exam on pt, he noticed pt started having slurred speech and started becoming altered. EDP at bedside.

## 2021-05-28 NOTE — Progress Notes (Signed)
Pharmacy Antibiotic Note  [name] is a 75 y.o. male admitted on 05/28/2021 with otomastoiditis.  Pharmacy has been consulted for Cefepime dosing.  Plan: Cefepime 2000 mg IV every 8 hours. Monitor labs, c/s, and patient improvement.   Height: 6' (182.9 cm) IBW/kg (Calculated) : 77.6  Temp (24hrs), Avg:98.1 F (36.7 C), Min:97.7 F (36.5 C), Max:98.5 F (36.9 C)  Recent Labs  Lab 05/28/21 1147  WBC 5.6  CREATININE 0.78  0.80    CrCl cannot be calculated (Unknown ideal weight.).    Allergies  Allergen Reactions   Hydrocodone    Hydrocodone-Acetaminophen     unknown   Aplisol [Tuberculin Ppd] Rash    Antimicrobials this admission: Cefepime 9/1 >>  Microbiology results: 9/1 UCx: pending    Thank you for allowing pharmacy to be a part of this patient's care.  Ramond Craver 05/28/2021 3:29 PM

## 2021-05-28 NOTE — ED Provider Notes (Signed)
[facility] EMERGENCY DEPARTMENT Provider Note   CSN: [id number] Arrival date & time: 05/28/21  1131  An emergency department physician performed an initial assessment on this suspected stroke patient at 1137.  History Chief Complaint  Patient presents with   Altered Mental Status    [name] is a 75 y.o. male.  HPI    Level 5 caveat for dementia   75 y.o. male with medical history significant for dementia, hypertension, GERD, diabetes mellitus , stroke with right-sided deficits comes in with chief complaint of acute change in mental status.  I spoke with the APP at the nursing facility.  Patient was last seen normal around 10 AM.  On the reassessment later that morning, patient was sleepy, confused, having lipsmacking, he was not following commands and had a blank stare.  They also noted that he had left-sided upper extremity weakness, left pupil was more sluggish and patient was snoring.  EMS was called.  Code stroke was activated.    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   Dementia    disoriented to time.does not know medical hx.information obtained from previous charts   Diabetes mellitus without complication ([place])    GERD (gastroesophageal reflux disease)    Hard of hearing    Hyperlipemia    Hypertension    Iron deficiency anemia    Obesity    SOB (shortness of breath) on exertion     Patient Active Problem List   Diagnosis Date Noted   Sepsis ([place]) 03/03/2021   Aspiration pneumonia of both lower lobes due to gastric secretions ([place]) 03/03/2021   SBO (small bowel obstruction) ([place]) 03/03/2021   Acute lower UTI 03/03/2021   H/O colon cancer, stage III 12/30/2020   Elevated LFTs    Pressure injury of skin 09/07/2020   Bacteremia due to Klebsiella pneumoniae 09/06/2020   AKI (acute kidney injury) ([place]) 09/06/2020   Lactic acidosis 09/06/2020   Mild  protein-calorie malnutrition ([place]) 09/06/2020   Septic shock ([place]) 09/06/2020   Cecal cancer ([place])    Colonic mass 05/09/2019   atrophic Gastritis 05/08/2019   Abdominal pain 05/08/2019   Dementia without behavioral disturbance ([facility])    SOB (shortness of breath) on exertion    Hypotension    Acute blood loss anemia 05/01/2018   Heme positive stool 05/01/2018   Guaiac positive stools    Iron deficiency anemia due to chronic blood loss    GIB (gastrointestinal bleeding) 04/30/2018   History of Bleeding ulcer    Essential hypertension    Encounter for screening colonoscopy 10/08/2011   CLOSED DISLOCATION OF FOOT UNSPECIFIED PART 01/15/2010    Past Surgical History:  Procedure Laterality Date   BIOPSY  05/09/2019   Procedure: BIOPSY;  Surgeon: Daneil Dolin, MD;  [facility]: [facility];  Service: Endoscopy;;   COLONOSCOPY  10/06/06   internal hemorrhoids and anal papilla/poor prep   COLONOSCOPY N/A 05/09/2019   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 Biopsy with atrophic gastritis.    FOOT SURGERY     KNEE ARTHROSCOPY  06/17/2011   Procedure: ARTHROSCOPY KNEE;  Surgeon: Sanjuana Kava;  Location: [facility];  Service: Orthopedics;  Laterality: Right;   LAPAROSCOPIC PARTIAL COLECTOMY N/A 05/11/2019   Procedure: ATTEMPTED LAPAROSCOPIC PARTIAL COLECTOMY;  Surgeon: [name], MD;  Location: [facility];  Service: General;  Laterality: N/A;    PARTIAL COLECTOMY  05/11/2019   Procedure: PARTIAL COLECTOMY (Laparotomy incision at [place];  Surgeon: [name], MD;  Location: [facility];  Service: General;;   POLYPECTOMY  05/09/2019   Procedure: POLYPECTOMY;  Surgeon: Daneil Dolin, MD;  [facility];  Service: Endoscopy;;       Family History  Problem Relation Age of Onset   COPD Mother    Cancer Brother    Anesthesia problems Neg Hx    Hypotension Neg Hx    Malignant hyperthermia Neg Hx    Pseudochol deficiency Neg Hx    Colon cancer Neg Hx     Social History   Tobacco Use   Smoking status: Former    Packs/day: 1.00    Years: 30.00    Pack years: 30.00    Types: Cigarettes   Smokeless tobacco: Never   Tobacco comments:    quit a "long time ago"   Vaping Use   Vaping Use: Never used  Substance Use Topics   Alcohol use: No   Drug use: No    Home Medications Prior to Admission medications   Medication Sig Start Date End Date Taking? Authorizing Provider  acetaminophen (TYLENOL) 650 MG CR tablet Take 1 tablet (650 mg total) by mouth every 8 (eight) hours as needed for pain or fever. 09/11/20   Hongalgi, Lenis Dickinson, MD  allopurinol (ZYLOPRIM) 100 MG tablet Take 100 mg by mouth daily.    [provider]  atorvastatin (LIPITOR) 10 MG tablet Take 5 mg by mouth at bedtime.     [provider]  cholecalciferol (VITAMIN D3) 25 MCG (1000 UNIT) tablet Take 1,000 Units by mouth daily.    [provider]  clobetasol cream (TEMOVATE) 1.61 % Apply 1 application topically 2 (two) times daily.    [provider]  Colloidal Oatmeal (EUCERIN ECZEMA RELIEF) 1 % CREA Apply 1 application topically 2 (two) times daily. Apply to face and neck    [provider]  cycloSPORINE (RESTASIS) 0.05 % ophthalmic emulsion Place 1 drop into both eyes 2 (two) times daily.    [provider]  feeding supplement (ENSURE ENLIVE / ENSURE PLUS) LIQD Take 237 mLs by mouth 2 (two) times  daily between meals. 09/11/20   Hongalgi, Lenis Dickinson, MD  furosemide (LASIX) 20 MG tablet Take 1 tablet (20 mg total) by mouth every other day. 03/11/21   Johnson, Clanford L, MD  ketoconazole (NIZORAL) 2 % shampoo Apply 1 application topically every 12 (twelve) hours as needed for irritation. Apply to Scalp    [provider]  metFORMIN (GLUCOPHAGE) 500 MG tablet Take 500 mg by mouth at bedtime.    [provider]  metoCLOPramide (REGLAN) 5 MG tablet Take 1 tablet (5 mg total) by mouth 3 (three) times daily before meals. 03/09/21   Johnson, Clanford L, MD  ondansetron (ZOFRAN) 4 MG tablet Take 4 mg by mouth every 6 (six) hours as needed for nausea or vomiting.    [provider]  polyethylene glycol (MIRALAX / GLYCOLAX) 17 g packet Take 17 g by mouth daily as needed for mild constipation. 03/09/21   Johnson, Clanford L, MD  potassium chloride (KLOR-CON) 10 MEQ tablet Take 1 tablet (10 mEq total) by mouth every other day. 03/11/21   Johnson, Clanford L, MD  vitamin B-12 (CYANOCOBALAMIN) 1000 MCG tablet Take 1,000 mcg by mouth daily.    [provider]    Allergies    Hydrocodone, Hydrocodone-acetaminophen, and Aplisol [tuberculin ppd]  Review of Systems   Review of Systems  Unable to perform ROS: Mental status change   Physical Exam Updated Vital Signs BP (!) 136/91   Pulse 97   Temp 98.5 F (36.9 C) (Rectal)   Resp 16   Ht 6' (1.829 m)   SpO2 95%   BMI 31.39 kg/m   Physical Exam Vitals and nursing note reviewed.  Constitutional:      Appearance: He is well-developed.     Comments: Somnolent  HENT:     Head: Atraumatic.  Cardiovascular:     Rate and Rhythm: Normal rate.  Pulmonary:     Effort: Pulmonary effort is normal.  Musculoskeletal:     Cervical back: Neck supple.  Skin:    General: Skin is warm.  Neurological:     Comments: Oriented to self.  Right side is paralyzed. Left lower extremity is contracted, with a contraction device over  the knee.  Left upper extremity strength is 2 out of 5, speech is slurred, gross sensory exam for upper and lower extremity is at baseline, with diminished sensation on right side.  Pupils are equal and reactive, no nystagmus, no neglect, speech is slurred.     ED Results / Procedures / Treatments   Labs (all labs ordered are listed, but only abnormal results are displayed) Labs Reviewed  CBC - Abnormal; Notable for the following components:      Result Value   Hemoglobin 12.7 (*)    HCT 38.9 (*)    All other components within normal limits  COMPREHENSIVE METABOLIC PANEL - Abnormal; Notable for the following components:   Glucose, Bld 105 (*)    All other components within normal limits  BLOOD GAS, VENOUS - Abnormal; Notable for the following components:   pO2, Ven <31.0 (*)    Acid-Base Excess 6.3 (*)    All other components within normal limits  CBG MONITORING, ED - Abnormal; Notable for the following components:   Glucose-Capillary 108 (*)    All other components within normal limits  I-STAT CHEM 8, ED - Abnormal; Notable for the following components:   Glucose, Bld 104 (*)    All other components within normal limits  RESP PANEL BY RT-PCR (FLU A&B, COVID) ARPGX2  ETHANOL  PROTIME-INR  APTT  DIFFERENTIAL  RAPID URINE DRUG SCREEN, HOSP PERFORMED  URINALYSIS, ROUTINE W REFLEX MICROSCOPIC    EKG EKG Interpretation  Date/Time:  Thursday May 28 2021 11:37:37 EDT Ventricular Rate:  92 PR Interval:  168 QRS Duration: 106 QT Interval:  412 QTC Calculation: 510 R Axis:   -52 Text Interpretation: Sinus rhythm Multiform ventricular premature complexes Left anterior fascicular block Abnormal R-wave progression, early transition Prolonged QT interval No acute changes Confirmed by Varney Biles [phone]) on 05/28/2021 1:32:46 PM  Radiology CT HEAD CODE STROKE WO CONTRAST  Result Date: 05/28/2021 CLINICAL DATA:  Code stroke.  Neuro deficit, acute, stroke suspected EXAM:  CT HEAD WITHOUT CONTRAST TECHNIQUE: Contiguous axial images were obtained from the base of the  skull through the vertex without intravenous contrast. COMPARISON:  Multiple priors, most recent 09/11/2020. FINDINGS: Brain: No evidence of acute infarction, hemorrhage, hydrocephalus, extra-axial collection or mass lesion/mass effect. Similar calcification in bilateral basal ganglia and white matter. Vascular: No hyperdense vessel identified. Skull: No acute fracture. Sinuses/Orbits: Clear sinuses.  No acute orbital findings. Other: Large right mastoid effusion. ASPECTS [facility] Stroke Program Early CT Score) Total score (0-10 with 10 being normal): 10. IMPRESSION: 1. No evidence of acute large vascular territory infarct or acute hemorrhage. ASPECTS is 10. 2. Chronic basal ganglia and white matter calcifications. 3. Large right mastoid effusion with middle ear fluid. Correlate with the presence or absence of signs/symptoms of otomastoiditis. Code stroke imaging results were communicated on 05/28/2021 at 12:01 pm to provider Shepherd via telephone. Electronically Signed   By: Margaretha Sheffield M.D.   On: 05/28/2021 12:04    Procedures .Critical Care  Date/Time: 05/29/2021 7:37 AM Performed by: Varney Biles, MD Authorized by: Varney Biles, MD   Critical care provider statement:    Critical care time (minutes):  40   Critical care was time spent personally by me on the following activities:  Discussions with consultants, evaluation of patient's response to treatment, examination of patient, ordering and performing treatments and interventions, ordering and review of laboratory studies, ordering and review of radiographic studies, pulse oximetry, re-evaluation of patient's condition, obtaining history from patient or surrogate, review of old charts, development of treatment plan with patient or surrogate and discussions with primary provider   Medications Ordered in ED Medications  levETIRAcetam (KEPPRA)  IVPB 1500 mg/ 100 mL premix (1,500 mg Intravenous New Bag/Given 05/28/21 1246)    ED Course  I have reviewed the triage vital signs and the nursing notes.  Pertinent labs & imaging results that were available during my care of the patient were reviewed by me and considered in my medical decision making (see chart for details).    MDM Rules/Calculators/A&P                           75 year old male comes in with chief complaint of acute change in mental status.  He has history of stroke with right-sided weakness, cognitive delay, dementia.  I spoke with the nurse practitioner at the facility, and it appears that he was last normal around 10 AM.  On reassessment by them by chance, he was noted to have increased confusion, blank staring episode, left-sided weakness and slurred speech.  Patient was also more sleepy and snoring.  Nursing staff also may be noted some lip smacking like episode.  Patient arrived to the ER with code stroke.  On exam he is noted to have slurred speech, left-sided weakness of the upper extremity and subjective sensory deficits over the left side of the face.  Patient is also slightly disoriented, but that could be baseline.  Differential diagnosis includes stroke, seizures.  Neurology has assessed the patient, they feel like at this time seizure is high in the differential and patient is not a good candidate for lytic therapy because of it, especially in the setting of him improving and at baseline having significant functional disability.  I think that is a sound recommendation.  1.5 g of Keppra ordered.  Additionally, CT scan reveals questionable mastoiditis.  I have reassessed the patient after CT scan.  He has no ear swelling, redness and no tenderness to palpation over the tragus or the mastoid region.  Suspicion for mastoiditis clinically  is quite low, deferring further care for it to medicine service.   Final Clinical Impression(s) / ED Diagnoses Final  diagnoses:  Transient alteration of awareness  Seizure-like activity ([place])  Slurred speech    Rx / DC Orders ED Discharge Orders     None        Varney Biles, MD 05/29/21 [phone]

## 2021-05-28 NOTE — Progress Notes (Signed)
Code stroke times  1136 call time [id number] beeper 1151 exam started 1152 exam finished 4163 images sent to [facility] 1154 exam completed in EPIC 1154 [place] rad called

## 2021-05-28 NOTE — H&P (Signed)
History and Physical    [name]:[id number] DOB: 08/17/1946 DOA: 05/28/2021  PCP: Caprice Renshaw, MD (Confirm with patient/family/NH records and if not entered, this has to be entered at [facility] point of entry) Patient coming from: [place]  I have personally briefly reviewed patient's old medical records in [place]  Chief Complaint: Altered mental status  HPI: [name] is a 75 y.o. male living at nursing facility, with medical history significant of dementia, nonambulatory status requiring assistance with ADLs, vitamin B12 deficiency, type 2 diabetes, muscle weakness who is usually alert and oriented and following simple commands at baseline presenting to the ED with altered mental status that started on the morning of admission.  Patient with a baseline dementia and as such history obtained per ED physician and per telemetry neurologist note.  It is noted that patient was seen by nurse practitioner early on in the morning while eating breakfast around 10 AM and appeared relatively normal however when patient was assessed again around 10:30 AM patient noted to be more significantly confused than his baseline noted to be weak in his bilateral upper extremities and was felt as if patient had slightly sluggish left pupil.  It was also noted that patient had some intermittent recurrent episodes where he would be completely unresponsive with a fixed gaze eyes would be open however not responding to verbal or when patient was shaking.  It was also noted that patient did have some breathing changes and thought it was like a postictal breathing with some lipsmacking as well.  Patient with no recent history of fever, no chills, no nausea, no vomiting, no abdominal pain, no diarrhea, no constipation, no bleeding.  ED Course: Patient seen in the ED and per ED physician patient noted to be more responsive and answering simple questions with some left-sided weakness and slurred speech.   Code stroke was called on presentation.  Head CT done was negative for any evidence of acute large vascular territory infarct or acute hemorrhage.  Chronic basal ganglia white matter calcifications noted.  Large right mastoid effusion with middle ear fluid, correlate with presence or absence of signs or symptoms of otomastoiditis. Patient seen by telemetry neurology and concern was for seizures in the setting of underlying dementia and recommended treating seizures.  Patient given Keppra 1500 mg loading dose x1.  Hospitalist were consulted to admit the patient for further evaluation and management.  Comprehensive metabolic profile obtained within normal limits.  CBC done within normal limits.  Urinalysis pending.  INR 1.1.  Chest x-ray with cardiomegaly with low lung volumes otherwise no evidence of acute cardiopulmonary process.  Review of Systems: As per HPI otherwise all other systems reviewed and are negative.  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   Dementia    disoriented to time.does not know medical hx.information obtained from previous charts   Diabetes mellitus without complication ([place])    GERD (gastroesophageal reflux disease)    Hard of hearing    Hyperlipemia    Hypertension    Iron deficiency anemia    Obesity    SOB (shortness of breath) on exertion     Past Surgical History:  Procedure Laterality Date   BIOPSY  05/09/2019   Procedure: BIOPSY;  Surgeon: Daneil Dolin, MD;  [facility];  Service: Endoscopy;;   COLONOSCOPY  10/06/06   internal hemorrhoids and anal papilla/poor prep   COLONOSCOPY N/A 05/09/2019   Dr. Gala Romney: Cecal adenocarcinoma   CYSTOSCOPY W/ URETERAL STENT PLACEMENT Right 09/08/2020   Procedure: CYSTOSCOPY WITH RETROGRADE PYELOGRAM/URETERAL STENT PLACEMENT;  Surgeon: Irine Seal, MD;  Location: [place];  Service: Urology;  Laterality:  Right;   CYSTOSCOPY/URETEROSCOPY/HOLMIUM LASER/STENT PLACEMENT Right 09/30/2020   Procedure: CYSTOSCOPY RIGHT URETEROSCOPY/HOLMIUM LASER/STENT PLACEMENT;  Surgeon: Irine Seal, MD;  Location[facility];  Service: Urology;  Laterality: Right;   ESOPHAGOGASTRODUODENOSCOPY N/A 05/01/2018   multiple 3-6 mm gastric nodules and gastritis, surrounding telangectasias. Biopsy with atrophic gastritis.    FOOT SURGERY     KNEE ARTHROSCOPY  06/17/2011   Procedure: ARTHROSCOPY KNEE;  Surgeon: Sanjuana Kava;  Location: [facility];  Service: Orthopedics;  Laterality: Right;   LAPAROSCOPIC PARTIAL COLECTOMY N/A 05/11/2019   Procedure: ATTEMPTED LAPAROSCOPIC PARTIAL COLECTOMY;  Surgeon: [name], MD;  Location: [facility];  Service: General;  Laterality: N/A;   PARTIAL COLECTOMY  05/11/2019   Procedure: PARTIAL COLECTOMY (Laparotomy incision at [place];  Surgeon: [name], MD;  Location: [facility];  Service: General;;   POLYPECTOMY  05/09/2019   Procedure: POLYPECTOMY;  Surgeon: Daneil Dolin, MD;  [facility];  Service: Endoscopy;;    Social History  reports that he has quit smoking. His smoking use included cigarettes. He has a 30.00 pack-year smoking history. He has never used smokeless tobacco. He reports that he does not drink alcohol and does not use drugs.  Allergies  Allergen Reactions   Hydrocodone    Hydrocodone-Acetaminophen     unknown   Aplisol [Tuberculin Ppd] Rash    Family History  Problem Relation Age of Onset   COPD Mother    Cancer Brother    Anesthesia problems Neg Hx    Hypotension Neg Hx    Malignant hyperthermia Neg Hx    Pseudochol deficiency Neg Hx    Colon cancer Neg Hx    Due to patient dementia unable to answer family history questions.  As per above.  Prior to Admission medications   Medication Sig Start Date End Date Taking? Authorizing Provider  allopurinol (ZYLOPRIM) 100 MG tablet Take 100 mg by mouth daily.   Yes [provider]  Ascorbic  Acid (VITAMIN C PO) Take 1 tablet by mouth daily.   Yes [provider]  atorvastatin (LIPITOR) 10 MG tablet Take 5 mg by mouth at bedtime.    Yes [provider]  feeding supplement (ENSURE ENLIVE / ENSURE PLUS) LIQD Take 237 mLs by mouth 2 (two) times daily between meals. 09/11/20  Yes Hongalgi, Lenis Dickinson, MD  furosemide (LASIX) 20 MG tablet Take 1 tablet (20 mg total) by mouth every other day. 03/11/21  Yes Johnson, Clanford L, MD  metFORMIN (GLUCOPHAGE) 500 MG tablet Take 500 mg by mouth at bedtime.   Yes [provider]  Multiple Vitamin (MULTIVITAMIN) tablet Take 1 tablet by mouth daily.   Yes [provider]  polyethylene glycol (MIRALAX / GLYCOLAX) 17 g packet Take 17 g by mouth daily as needed for mild constipation. 03/09/21  Yes Johnson, Clanford L, MD  potassium chloride (KLOR-CON) 10 MEQ tablet Take 1 tablet (10 mEq total) by mouth every other day. 03/11/21  Yes Johnson, Clanford L, MD  acetaminophen (TYLENOL) 650 MG CR tablet Take 1 tablet (650 mg total) by mouth every 8 (eight) hours as needed for pain or fever. 09/11/20  Modena Jansky, MD  cholecalciferol (VITAMIN D3) 25 MCG (1000 UNIT) tablet Take 1,000 Units by mouth daily.    [provider]  clobetasol cream (TEMOVATE) 6.75 % Apply 1 application topically 2 (two) times daily.    [provider]  Colloidal Oatmeal (EUCERIN ECZEMA RELIEF) 1 % CREA Apply 1 application topically 2 (two) times daily. Apply to face and neck    [provider]  cycloSPORINE (RESTASIS) 0.05 % ophthalmic emulsion Place 1 drop into both eyes 2 (two) times daily.    [provider]  ketoconazole (NIZORAL) 2 % shampoo Apply 1 application topically every 12 (twelve) hours as needed for irritation. Apply to Scalp    [provider]  metoCLOPramide (REGLAN) 5 MG tablet Take 1 tablet (5 mg total) by mouth 3 (three) times daily before meals. Patient not taking: Reported on 05/28/2021  03/09/21   Irwin Brakeman L, MD  ondansetron (ZOFRAN) 4 MG tablet Take 4 mg by mouth every 6 (six) hours as needed for nausea or vomiting.    [provider]  vitamin B-12 (CYANOCOBALAMIN) 1000 MCG tablet Take 1,000 mcg by mouth daily.    [provider]    Physical Exam: Vitals:   05/28/21 1300 05/28/21 1315 05/28/21 1354 05/28/21 1410  BP: (!) 159/79 (!) 136/91 111/66 121/72  Pulse: 92 97 93 99  Resp: 16 16 16 18   Temp:      TempSrc:      SpO2: 97% 95% 98% 98%  Height:        Constitutional: NAD, calm, comfortable Vitals:   05/28/21 1300 05/28/21 1315 05/28/21 1354 05/28/21 1410  BP: (!) 159/79 (!) 136/91 111/66 121/72  Pulse: 92 97 93 99  Resp: 16 16 16 18   Temp:      TempSrc:      SpO2: 97% 95% 98% 98%  Height:       Eyes: PERRL, lids and conjunctivae normal ENMT: Mucous membranes are moist. Posterior pharynx clear of any exudate or lesions.Normal dentition.  Neck: normal, supple, no masses, no thyromegaly Respiratory: clear to auscultation bilaterally, no wheezing, no crackles. Normal respiratory effort. No accessory muscle use.  Cardiovascular: Regular rate and rhythm, no murmurs / rubs / gallops. No extremity edema. 2+ pedal pulses. No carotid bruits.  Abdomen: no tenderness, no masses palpated. No hepatosplenomegaly. Bowel sounds positive.  Musculoskeletal: no clubbing / cyanosis. No joint deformity upper and lower extremities. Good ROM, no contractures. Normal muscle tone.  Skin: no rashes, lesions, ulcers. No induration Neurologic: Alert to self and place.  CN 2-12 grossly intact.  3/5 bilateral upper extremity strength.  0/5 bilateral lower extremity strength.  Unable to assess reflexes diffusely and symmetrically.  Sensation intact.  Following simple commands.  Psychiatric: Poor to fair judgment and insight. Alert and oriented x 2. Normal mood.   Labs on Admission: I have personally reviewed following labs and imaging studies  CBC: Recent Labs   Lab 05/28/21 1147  WBC 5.6  NEUTROABS 2.9  HGB 12.7*  13.3  HCT 38.9*  39.0  MCV 89.6  PLT 916    Basic Metabolic Panel: Recent Labs  Lab 05/28/21 1147  NA 135  139  K 3.8  4.1  CL 100  99  CO2 29  GLUCOSE 105*  104*  BUN 16  16  CREATININE 0.78  0.80  CALCIUM 9.1    GFR: CrCl cannot be calculated (Unknown ideal weight.).  Liver Function Tests: Recent Labs  Lab 05/28/21 1147  AST 25  ALT 25  ALKPHOS 110  BILITOT 0.5  PROT 7.2  ALBUMIN 3.7    Urine analysis:    Component Value Date/Time   COLORURINE YELLOW 03/03/2021 1157   APPEARANCEUR CLOUDY (A) 03/03/2021 1157   LABSPEC 1.019 03/03/2021 1157   PHURINE 5.0 03/03/2021 1157   GLUCOSEU NEGATIVE 03/03/2021 1157   HGBUR MODERATE (A) 03/03/2021 1157   BILIRUBINUR NEGATIVE 03/03/2021 1157   KETONESUR 5 (A) 03/03/2021 1157   PROTEINUR NEGATIVE 03/03/2021 1157   UROBILINOGEN 0.2 05/27/2010 2038   NITRITE NEGATIVE 03/03/2021 1157   LEUKOCYTESUR LARGE (A) 03/03/2021 1157    Radiological Exams on Admission: DG Chest Port 1 View  Result Date: 05/28/2021 CLINICAL DATA:  Evaluate for infection EXAM: PORTABLE CHEST 1 VIEW COMPARISON:  Chest radiograph 03/03/2021 FINDINGS: The heart is enlarged. The mediastinal contours are stable, allowing for rightward patient rotation. The lung volumes are low. There is asymmetric elevation of the right hemidiaphragm. There is no focal consolidation. There is mild vascular congestion without overt pulmonary edema. There is no pleural effusion or pneumothorax. There is no acute osseous abnormality. IMPRESSION: Cardiomegaly and low lung volumes. Otherwise, no radiographic evidence of acute cardiopulmonary process. Electronically Signed   By: Valetta Mole M.D.   On: 05/28/2021 14:05   CT HEAD CODE STROKE WO CONTRAST  Result Date: 05/28/2021 CLINICAL DATA:  Code stroke.  Neuro deficit, acute, stroke suspected EXAM: CT HEAD WITHOUT CONTRAST TECHNIQUE: Contiguous axial images were  obtained from the base of the skull through the vertex without intravenous contrast. COMPARISON:  Multiple priors, most recent 09/11/2020. FINDINGS: Brain: No evidence of acute infarction, hemorrhage, hydrocephalus, extra-axial collection or mass lesion/mass effect. Similar calcification in bilateral basal ganglia and white matter. Vascular: No hyperdense vessel identified. Skull: No acute fracture. Sinuses/Orbits: Clear sinuses.  No acute orbital findings. Other: Large right mastoid effusion. ASPECTS [facility] Stroke Program Early CT Score) Total score (0-10 with 10 being normal): 10. IMPRESSION: 1. No evidence of acute large vascular territory infarct or acute hemorrhage. ASPECTS is 10. 2. Chronic basal ganglia and white matter calcifications. 3. Large right mastoid effusion with middle ear fluid. Correlate with the presence or absence of signs/symptoms of otomastoiditis. Code stroke imaging results were communicated on 05/28/2021 at 12:01 pm to provider [place] via telephone. Electronically Signed   By: Margaretha Sheffield M.D.   On: 05/28/2021 12:04    EKG: Independently reviewed.  Normal sinus rhythm with occasional PVCs, QTC prolongation.  Assessment/Plan Principal Problem:   Seizure ([place]) Active Problems:   Acute metabolic encephalopathy   Essential hypertension   Iron deficiency anemia due to chronic blood loss   atrophic Gastritis   Dementia without behavioral disturbance (HCC)   Pressure injury of skin   H/O colon cancer, stage III   Mastoiditis, right: Per CT head    #1 acute metabolic encephalopathy/concern for seizures -Patient presented with acute metabolic encephalopathy with episodes and spells of fixators and unresponsiveness with some concerns initially at facility for postictal state. -Patient with underlying baseline dementia, head CT done negative for any acute abnormalities however concerning for otomastoiditis which could lower seizure threshold per telemetry  neurologist. -Patient seen in consultation by telemetry neurology, Dr. Leonel Ramsay who is recommending MRI head, EEG. -UA with cultures and sensitivities to rule out other infectious etiologies. -Patient loaded with Keppra 1500 mg IV x1 per teleneurology recommendations. -Currently awaiting swallow evaluation and as such currently n.p.o. and as such we will place on Keppra 500 mg IV twice daily per  neurology recommendations. -Once tolerating oral intake teleneurology favoring Depakote 500 mg twice daily with a 1 g oral load. -Placed under seizure precautions. -Ativan as needed -Formal inpatient consultation with neurology, placed.  2.  Otomastoiditis -Noted on CT head. -Patient denies any ear pain, denies any hearing loss. -Physical exam was unremarkable for any infection as patient had no redness and no pain and no inflammation noted.   -Case discussed with ENT, Dr. Redmond Baseman who recommended that if clinical exam is not consistent with acute infection then antibiotics are not recommended at this time and will need close outpatient follow-up.   -Patient received a dose of IV cefepime and we will discontinue cefepime at this time.   -Outpatient follow-up with ENT.   3.  QT prolongation -Replete electrolytes to keep potassium approximately at 4, magnesium approximately 2. -Repeat EKG in the morning.  4.  Diabetes mellitus type 2 -Check a hemoglobin A1c. -Hold oral hypoglycemic agents. -SSI.  5.  Debility/nonambulatory status -Patient noted to be nonambulatory, requiring assistance with ADLs. -PT/OT. -Likely back to skilled nursing facility when medically stable.  6.  Hyperlipidemia -Continue home regimen statin.  7.  Hypertension -Continue Lasix.  8.  Iron deficiency anemia/vitamin B12 deficiency -Resume home regimen oral vitamin B12 supplementation. -Check an anemia panel. -Follow H&H. -Transfusion threshold hemoglobin < 7.   9.  Dementia without behavioral  disturbance -Stable.  DVT prophylaxis: Lovenox Code Status:  DNR Family Communication:  Updated patient.  No family at bedside. Disposition Plan:   Patient is from:  SNF  Anticipated DC to:  SNF  Anticipated DC date:  3 to 4 days  Anticipated DC barriers: None  Consults called:  Inpatient neurology consultation pending./Patient seen by telemetry neurology, Dr. Leonel Ramsay 05/28/2021. Admission status:  Admit to inpatient/telemetry.  Severity of Illness: The appropriate patient status for this patient is INPATIENT. Inpatient status is judged to be reasonable and necessary in order to provide the required intensity of service to ensure the patient's safety. The patient's presenting symptoms, physical exam findings, and initial radiographic and laboratory data in the context of their chronic comorbidities is felt to place them at high risk for further clinical deterioration. Furthermore, it is not anticipated that the patient will be medically stable for discharge from the hospital within 2 midnights of admission. The following factors support the patient status of inpatient.   " The patient's presenting symptoms include acute metabolic encephalopathy concerning for seizures.. " The worrisome physical exam findings include acute metabolic encephalopathy.. " The chronic co-morbidities include nonambulatory status, dementia, bedbound, high fall risk..   * I certify that at the point of admission it is my clinical judgment that the patient will require inpatient hospital care spanning beyond 2 midnights from the point of admission due to high intensity of service, high risk for further deterioration and high frequency of surveillance required.*    Irine Seal MD [facility]  How to contact the [facility] Attending or Consulting provider [place] or covering provider during after hours [place], for this patient?   Check the care team in [facility] and look for a) attending/consulting [facility] provider  listed and b) the [facility] team listed Log into [email or web address] and use [place]'s universal password to access. If you do not have the password, please contact the hospital operator. Locate the [facility] provider you are looking for under [facility] and page to a number that you can be directly reached. If you still have difficulty reaching the provider, please page  the [facility] (Director on Call) for the Hospitalists listed on amion for assistance.  05/28/2021, 3:22 PM

## 2021-05-28 NOTE — Consult Note (Signed)
[facility] Telemedicine Consult   Requesting Provider: Dollene Cleveland Consult Participants: PAtient, Bedside nurse  Location of the provider: [facility] Location of the patient: [facility]  This consult was provided via telemedicine with 2-way video and audio communication. The patient/family was informed that care would be provided in this way and agreed to receive care in this manner.    Chief Complaint: AMS  HPI: 75 yo M With a history of previous stroke and exam from this year documenting 3/5 strength bilaterally who presents with AMS that began this morning.  He is essentially total care at the nursing home, he is able to feed himself some but requires assistance with feedings.  He is somewhat conversant, but does have significant dementia and therefore does not know answers to questions.  Nurse practitioner saw him earlier while eating breakfast and he appeared relatively normal, but when she went back to checkup on him around 1030, she noticed that he was significantly more confused than typical.  He seemed to be weaker in bilateral upper extremities and she felt like his left pupil was slightly sluggish.    It was also noticed that he had intermittent, recurrent spells where he would be completely unresponsive with a fixed gaze.  Eyes would be open, but you would not build to get his attention even by shaking him.  He also had a breathing change during those events("almost like a postictal breathing" per the nurse practitioner) coupled with lipsmacking activity.  Since arriving to the emergency department, he has more responsive than was described in the spells, able to look around and fixate and answer simple questions.  He does appear to be less conversant than he is at baseline, however.  LKW: 10am  tpa given?: No, seizure at onset IR Thrombectomy? No, poor baseline.  MRS: 4 Time of teleneurologist evaluation: 11:47  Exam: Vitals:    05/28/21 1145 05/28/21 1148  BP: 119/61 119/61  Pulse: 91 92  Resp: 18 16  Temp: 97.7 F (36.5 C)   SpO2: 98% 97%    General: in bed, NAD  1A: Level of Consciousness - 0 1B: Ask Month and Age - 2 1C: 'Blink Eyes' & 'Squeeze Hands' - 0 2: Test Horizontal Extraocular Movements - 0 3: Test Visual Fields - 0 4: Test Facial Palsy - 2 appears to have some weakness bilaterally, worse on right.  5A: Test Left Arm Motor Drift - 3 5B: Test Right Arm Motor Drift - 3 6A: Test Left Leg Motor Drift - 3(able to wiggle toes, but little other movement)  6B: Test Right Leg Motor Drift - 3 7: Test Limb Ataxia - 1 8: Test Sensation - 1 9: Test Language/Aphasia- 1(slowed speech) 10: Test Dysarthria - 1 11: Test Extinction/Inattention - 0 NIHSS score: 19   Imaging Reviewed: CT head-no acute intracranial findings, he does have a right mastoid effusion  Labs reviewed in epic and pertinent values follow: CBG 108     Assessment: 75 yo M with acutely worsened mental status coupled with lipsmacking, episodes of unresponsiveness with fixed gaze.  The description of these episodes sounds much more consistent with seizure with postictal state then new onset stroke.  Given this presentation, I would not favor IV thrombolytics at this time.  He has a relatively impaired baseline, and from description of the nursing of staff I think he is not very far rom his baseline but his speech seems slower than what was described.  At this time, based  on the description of the spells in the setting of his underlying dementia I would favor treating these as seizures.  He does have a mastoid effusion, if he had a mastoiditis this could certainly lower seizure threshold.   Recommendations:  1) Keppra 1500 mg x 1 2) if he is able to take oral medications, I would favor maintenance with Depakote 500 mg twice daily, with a 1 g oral load.  Unfortunately there is a short supply of Depakote IV at the current time. If unable  to take PO, would use keppra IV 500mg  BID. 3) EEG 4) MRI brain 5) evaluation for otomastoiditis per ER/IM  6) inpatient neurology consultation   This patient is receiving care for possible acute neurological changes. There was 40 minutes of care by this provider at the time of service, including time for direct evaluation via telemedicine, review of medical records, imaging studies and discussion of findings with providers, the patient and/or family.  Roland Rack, MD [facility] [phone]  If 7pm- 7am, please page neurology on call as listed in [place].

## 2021-05-29 ENCOUNTER — Other Ambulatory Visit: Payer: Self-pay

## 2021-05-29 ENCOUNTER — Inpatient Hospital Stay (HOSPITAL_COMMUNITY)
Admit: 2021-05-29 | Discharge: 2021-05-29 | Disposition: A | Discharge status: Home / Self Care | Payer: Medicare (Managed Care) | Attending: Internal Medicine | Admitting: Internal Medicine

## 2021-05-29 LAB — COMPREHENSIVE METABOLIC PANEL
ALT: 22 U/L (ref 0–44)
AST: 21 U/L (ref 15–41)
Albumin: 3.6 g/dL (ref 3.5–5.0)
Alkaline Phosphatase: 103 U/L (ref 38–126)
Anion gap: 10 (ref 5–15)
BUN: 17 mg/dL (ref 8–23)
CO2: 27 mmol/L (ref 22–32)
Calcium: 8.9 mg/dL (ref 8.9–10.3)
Chloride: 101 mmol/L (ref 98–111)
Creatinine, Ser: 0.71 mg/dL (ref 0.61–1.24)
GFR, Estimated: >60 mL/min (ref >60)
Glucose, Bld: 95 mg/dL (ref 70–99)
Potassium: 3.6 mmol/L (ref 3.5–5.1)
Sodium: 138 mmol/L (ref 135–145)
Total Bilirubin: 1 mg/dL (ref 0.3–1.2)
Total Protein: 6.8 g/dL (ref 6.5–8.1)

## 2021-05-29 LAB — RAPID URINE DRUG SCREEN, HOSP PERFORMED
Amphetamines: NOT DETECTED
Barbiturates: NOT DETECTED
Benzodiazepines: NOT DETECTED
Cocaine: NOT DETECTED
Opiates: NOT DETECTED
Tetrahydrocannabinol: NOT DETECTED

## 2021-05-29 LAB — FOLATE: Folate: 8.1 ng/mL (ref >5.9)

## 2021-05-29 LAB — FERRITIN: Ferritin: 9 ng/mL — ABNORMAL LOW (ref 24–336)

## 2021-05-29 LAB — URINALYSIS, ROUTINE W REFLEX MICROSCOPIC
Bilirubin Urine: NEGATIVE
Glucose, UA: NEGATIVE mg/dL
Hgb urine dipstick: NEGATIVE
Ketones, ur: NEGATIVE mg/dL
Nitrite: POSITIVE — AB
Protein, ur: NEGATIVE mg/dL
Specific Gravity, Urine: 1.02 (ref 1.005–1.030)
pH: 6 (ref 5.0–8.0)

## 2021-05-29 LAB — CBC
HCT: 38 % — ABNORMAL LOW (ref 39.0–52.0)
Hemoglobin: 12.1 g/dL — ABNORMAL LOW (ref 13.0–17.0)
MCH: 28.7 pg (ref 26.0–34.0)
MCHC: 31.8 g/dL (ref 30.0–36.0)
MCV: 90.3 fL (ref 80.0–100.0)
Platelets: 156 10*3/uL (ref 150–400)
RBC: 4.21 MIL/uL — ABNORMAL LOW (ref 4.22–5.81)
RDW: 13.2 % (ref 11.5–15.5)
WBC: 5.4 10*3/uL (ref 4.0–10.5)
nRBC: 0 % (ref 0.0–0.2)

## 2021-05-29 LAB — GLUCOSE, CAPILLARY
Glucose-Capillary: 100 mg/dL — ABNORMAL HIGH (ref 70–99)
Glucose-Capillary: 76 mg/dL (ref 70–99)
Glucose-Capillary: 79 mg/dL (ref 70–99)
Glucose-Capillary: 86 mg/dL (ref 70–99)
Glucose-Capillary: 89 mg/dL (ref 70–99)

## 2021-05-29 LAB — MRSA NEXT GEN BY PCR, NASAL: MRSA by PCR Next Gen: DETECTED — AB

## 2021-05-29 LAB — IRON AND TIBC
Iron: 75 ug/dL (ref 45–182)
Saturation Ratios: 21 % (ref 17.9–39.5)
TIBC: 359 ug/dL (ref 250–450)
UIBC: 284 ug/dL

## 2021-05-29 LAB — VITAMIN B12: Vitamin B-12: 255 pg/mL (ref 180–914)

## 2021-05-29 LAB — URINALYSIS, MICROSCOPIC (REFLEX)
RBC / HPF: NONE SEEN RBC/hpf (ref 0–5)
Squamous Epithelial / HPF: NONE SEEN (ref 0–5)
WBC, UA: >50 WBC/hpf (ref 0–5)

## 2021-05-29 MED ORDER — HYDROCERIN EX CREA
TOPICAL_CREAM | Freq: Two times a day (BID) | CUTANEOUS | Status: DC
  Filled 2021-05-29: qty 113

## 2021-05-29 MED ORDER — SODIUM CHLORIDE 0.9 % IV SOLN
1.0000 g | INTRAVENOUS | Status: DC
  Administered 2021-05-29 – 2021-05-30 (×2): 1 g via INTRAVENOUS
  Filled 2021-05-29 (×3): qty 10

## 2021-05-29 MED ORDER — TRIAMCINOLONE ACETONIDE 0.1 % EX CREA
TOPICAL_CREAM | Freq: Two times a day (BID) | CUTANEOUS | Status: DC
  Filled 2021-05-29: qty 15

## 2021-05-29 NOTE — Progress Notes (Signed)
EEG Completed; Results Pending  

## 2021-05-29 NOTE — Progress Notes (Signed)
EKG done and given to nurse 

## 2021-05-29 NOTE — Progress Notes (Addendum)
PROGRESS NOTE   [name]  ZDG:[id number] DOB: [birth year] DOA: 05/28/2021 PCP: Caprice Renshaw, MD   Chief Complaint  Patient presents with   Altered Mental Status   Level of care: Telemetry  Brief Admission History:  75 y.o. male living at nursing facility, with medical history significant of dementia, nonambulatory status requiring assistance with ADLs, vitamin B12 deficiency, type 2 diabetes, muscle weakness who is usually alert and oriented and following simple commands at baseline presenting to the ED with altered mental status that started on the morning of admission.  He was admitted with acute encephalopathy.   Assessment & Plan:   Principal Problem:   Seizure ([place]) Active Problems:   Essential hypertension   Iron deficiency anemia due to chronic blood loss   atrophic Gastritis   Dementia without behavioral disturbance (HCC)   Pressure injury of skin   H/O colon cancer, stage III   Acute metabolic encephalopathy   Mastoiditis, right: Per CT head   Acute metabolic encephalopathy-likely multifactorial as he does have evidence of a UTI and we have started him on treatment with ceftriaxone for that.  Continue other supportive measures.  In addition there was concern for epileptic seizure and postictal state.  He has been loaded with Keppra and neurology has been consulted and following.  Appreciate neurology recommendations.  EEG has been ordered and will follow up on results.  UTI-follow urine culture treating as above.  Prolonged QT-repeat EKG with persistent QT elevation, follow magnesium and replace as needed.  Type 2 diabetes mellitus-we are monitoring with serial CBG monitoring tests and SSI coverage holding home oral hypoglycemic agents  Chronic debilitated state.  Patient is long-term resident of SNF and will return when medically stabilized.  Hyperlipidemia-resume home statin treatment.  Essential hypertension-blood pressure stable on current regimen.  Chronic  iron deficiency anemia and vitamin B12 deficiency-resume home supplements.  Dementia without behavioral disturbance-he seems to be slowly returning to baseline.  DVT prophylaxis: Enoxaparin Code Status: DNR Family Communication: Not applicable Disposition: Anticipate return to SNF when medically stabilized Status is: Inpatient  Remains inpatient appropriate because:IV treatments appropriate due to intensity of illness or inability to take PO and Inpatient level of care appropriate due to severity of illness  Dispo: The patient is from: SNF              Anticipated d/c is to: SNF              Patient currently is not medically stable to d/c.   Difficult to place patient No  Consultants:  Neurology  Procedures:  EEG  Antimicrobials:  Ceftriaxone 05/29/2021  Subjective: Patient able to follow some commands and there are some questions but remains somnolent  Objective: Vitals:   05/29/21 0449 05/29/21 0452 05/29/21 0826 05/29/21 1342  BP: 112/61 112/61 103/67 114/61  Pulse: 78 78 78 86  Resp: 16   16  Temp: (!) 95.1 F (35.1 C) (!) 97.1 F (36.2 C) 97.6 F (36.4 C) 98.9 F (37.2 C)  TempSrc:  Oral Oral Oral  SpO2: 95% 95% 98% 100%  Weight:  98.6 kg    Height:        Intake/Output Summary (Last 24 hours) at 05/29/2021 1620 Last data filed at 05/29/2021 1511 Gross per 24 hour  Intake 2082.85 ml  Output --  Net 2082.85 ml   Filed Weights   05/29/21 0452  Weight: 98.6 kg    Examination:  General exam: Somnolent but easily arousable, appears calm and comfortable  Respiratory system: Clear to auscultation. Respiratory effort normal. Cardiovascular system: normal S1 & S2 heard. No JVD, murmurs, rubs, gallops or clicks. No pedal edema. Gastrointestinal system: Abdomen is nondistended, soft and nontender. No organomegaly or masses felt. Normal bowel sounds heard. Central nervous system: Somnolent. No focal neurological deficits. Extremities: Symmetric 5 x 5 power. Skin:  No rashes, lesions or ulcers Psychiatry: Judgement and insight appear poor. Mood & affect flat.   Data Reviewed: I have personally reviewed following labs and imaging studies  CBC: Recent Labs  Lab 05/28/21 1147 05/29/21 0543  WBC 5.6 5.4  NEUTROABS 2.9  --   HGB 12.7*  13.3 12.1*  HCT 38.9*  39.0 38.0*  MCV 89.6 90.3  PLT 168 539    Basic Metabolic Panel: Recent Labs  Lab 05/28/21 1147 05/29/21 0543  NA 135  139 138  K 3.8  4.1 3.6  CL 100  99 101  CO2 29 27  GLUCOSE 105*  104* 95  BUN 16  16 17   CREATININE 0.78  0.80 0.71  CALCIUM 9.1 8.9  MG 1.9  --   PHOS 3.2  --     GFR: Estimated Creatinine Clearance: 95.6 mL/min (by C-G formula based on SCr of 0.71 mg/dL).  Liver Function Tests: Recent Labs  Lab 05/28/21 1147 05/29/21 0543  AST 25 21  ALT 25 22  ALKPHOS 110 103  BILITOT 0.5 1.0  PROT 7.2 6.8  ALBUMIN 3.7 3.6    CBG: Recent Labs  Lab 05/28/21 2311 05/29/21 0051 05/29/21 0737 05/29/21 1116 05/29/21 1615  GLUCAP 100* 79 89 100* 76    Recent Results (from the past 240 hour(s))  Resp Panel by RT-PCR (Flu A&B, Covid) Nasopharyngeal Swab     Status: None   Collection Time: 05/28/21 12:08 PM   Specimen: Nasopharyngeal Swab; Nasopharyngeal(NP) swabs in vial transport medium  Result Value Ref Range Status   SARS Coronavirus 2 by RT PCR NEGATIVE NEGATIVE Final    Comment: (NOTE) SARS-CoV-2 target nucleic acids are NOT DETECTED.  The SARS-CoV-2 RNA is generally detectable in upper respiratory specimens during the acute phase of infection. The lowest concentration of SARS-CoV-2 viral copies this assay can detect is 138 copies/mL. A negative result does not preclude SARS-Cov-2 infection and should not be used as the sole basis for treatment or other patient management decisions. A negative result may occur with  improper specimen collection/handling, submission of specimen other than nasopharyngeal swab, presence of viral mutation(s)  within the areas targeted by this assay, and inadequate number of viral copies(<138 copies/mL). A negative result must be combined with clinical observations, patient history, and epidemiological information. The expected result is Negative.  Fact Sheet for Patients:  EntrepreneurPulse.com.au  Fact Sheet for Healthcare Providers:  IncredibleEmployment.be  This test is no t yet approved or cleared by the [place] FDA and  has been authorized for detection and/or diagnosis of SARS-CoV-2 by FDA under an Emergency Use Authorization (EUA). This EUA will remain  in effect (meaning this test can be used) for the duration of the COVID-19 declaration under Section 564(b)(1) of the Act, 21 U.S.C.section 360bbb-3(b)(1), unless the authorization is terminated  or revoked sooner.       Influenza A by PCR NEGATIVE NEGATIVE Final   Influenza B by PCR NEGATIVE NEGATIVE Final    Comment: (NOTE) The Xpert Xpress SARS-CoV-2/FLU/RSV plus assay is intended as an aid in the diagnosis of influenza from Nasopharyngeal swab specimens and should not be used as a sole  basis for treatment. Nasal washings and aspirates are unacceptable for Xpert Xpress SARS-CoV-2/FLU/RSV testing.  Fact Sheet for Patients: EntrepreneurPulse.com.au  Fact Sheet for Healthcare Providers: IncredibleEmployment.be  This test is not yet approved or cleared by the [place] FDA and has been authorized for detection and/or diagnosis of SARS-CoV-2 by FDA under an Emergency Use Authorization (EUA). This EUA will remain in effect (meaning this test can be used) for the duration of the COVID-19 declaration under Section 564(b)(1) of the Act, 21 U.S.C. section 360bbb-3(b)(1), unless the authorization is terminated or revoked.  Performed at [facility], [address], [place], [place] [address]   MRSA Next Gen by PCR, Nasal     Status: Abnormal    Collection Time: 05/29/21  6:55 AM   Specimen: Nasal Mucosa; Nasal Swab  Result Value Ref Range Status   MRSA by PCR Next Gen DETECTED (A) NOT DETECTED Final    Comment: RESULT CALLED TO, READ BACK BY AND VERIFIED WITH: HUMPHRIES J. @ [id number] ON [id number] BY HENDERSON L. (NOTE) The GeneXpert MRSA Assay (FDA approved for NASAL specimens only), is one component of a comprehensive MRSA colonization surveillance program. It is not intended to diagnose MRSA infection nor to guide or monitor treatment for MRSA infections. Test performance is not FDA approved in patients less than 74 years old. Performed at [facility], [address], [place], [place] [address]   Culture, blood (routine x 2)     Status: None (Preliminary result)   Collection Time: 05/29/21 11:35 AM   Specimen: Right Antecubital; Blood  Result Value Ref Range Status   Specimen Description   Final    RIGHT ANTECUBITAL BOTTLES DRAWN AEROBIC AND ANAEROBIC   Special Requests   Final    Blood Culture results may not be optimal due to an excessive volume of blood received in culture bottles Performed at [facility], [address] [place], [place] [address]    Culture PENDING  Incomplete   Report Status PENDING  Incomplete  Culture, blood (routine x 2)     Status: None (Preliminary result)   Collection Time: 05/29/21 11:45 AM   Specimen: BLOOD LEFT ARM  Result Value Ref Range Status   Specimen Description BLOOD LEFT ARM BOTTLES DRAWN AEROBIC AND ANAEROBIC  Final   Special Requests   Final    Blood Culture results may not be optimal due to an inadequate volume of blood received in culture bottles Performed at [facility], [address], [place], [place] [address]    Culture PENDING  Incomplete   Report Status PENDING  Incomplete     Radiology Studies: MR BRAIN WO CONTRAST  Result Date: 05/28/2021 CLINICAL DATA:  Seizure, abnormal neuro exam. Altered mental status and slurred speech. EXAM: MRI HEAD WITHOUT CONTRAST TECHNIQUE:  Multiplanar, multiecho pulse sequences of the brain and surrounding structures were obtained without intravenous contrast. COMPARISON:  Head CT 05/28/2021 and MRI 09/07/2020 FINDINGS: The study is mildly motion degraded. Brain: There is no evidence of an acute infarct, intracranial hemorrhage, mass, midline shift, or extra-axial fluid collection. Mild trace diffusion weighted signal hyperintensity involving bilateral cerebral hemispheric white matter extending along the corticospinal tracts through the posterior limbs of the internal capsules and into the midbrain as well as in the splenium of the corpus callosum is chronic based on the prior MRI. There is also mild diffusion weighted signal abnormality in the superior and middle cerebellar peduncles. Susceptibility artifact in the basal ganglia and white matter correspond to longstanding calcifications on  CT. Patchy T2 hyperintensities in the cerebral white matter bilaterally are nonspecific but compatible with mild-to-moderate chronic small vessel ischemic disease. The ventricles and sulci are within normal limits for age. Vascular: Major intracranial vascular flow voids are preserved. Skull and upper cervical spine: No suspicious marrow lesion. Sinuses/Orbits: Bilateral cataract extraction. Clear paranasal sinuses. Large right mastoid and middle ear effusions. Other: None. IMPRESSION: 1. No acute intracranial abnormality. 2. Mild-to-moderate chronic small vessel ischemic disease. 3. Large right mastoid and middle ear effusions. Electronically Signed   By: Logan Bores M.D.   On: 05/28/2021 18:06   DG Chest Port 1 View  Result Date: 05/28/2021 CLINICAL DATA:  Evaluate for infection EXAM: PORTABLE CHEST 1 VIEW COMPARISON:  Chest radiograph 03/03/2021 FINDINGS: The heart is enlarged. The mediastinal contours are stable, allowing for rightward patient rotation. The lung volumes are low. There is asymmetric elevation of the right hemidiaphragm. There is no focal  consolidation. There is mild vascular congestion without overt pulmonary edema. There is no pleural effusion or pneumothorax. There is no acute osseous abnormality. IMPRESSION: Cardiomegaly and low lung volumes. Otherwise, no radiographic evidence of acute cardiopulmonary process. Electronically Signed   By: Valetta Mole M.D.   On: 05/28/2021 14:05   EEG adult  Result Date: 05/29/2021 Lora Havens, MD     05/29/2021  2:52 PM Patient Name: [name] MRN: [id number] Epilepsy Attending: Lora Havens Referring Physician/Provider: Dr Irine Seal Date: 05/29/2021 Duration: 23.07 mins Patient history: 75 year old male with altered mental status.  EEG to evaluate for seizures. Level of alertness: Awake, asleep AEDs during EEG study: LEV Technical aspects: This EEG study was done with scalp electrodes positioned according to the 10-20 International system of electrode placement. Electrical activity was acquired at a sampling rate of 500Hz  and reviewed with a high frequency filter of 70Hz  and a low frequency filter of 1Hz . EEG data were recorded continuously and digitally stored. Description: The posterior dominant rhythm consists of 8 Hz activity of moderate voltage (25-35 uV) seen predominantly in posterior head regions, symmetric and reactive to eye opening and eye closing. Sleep was characterized by sleep spindles (12 to 14 Hz), maximal frontocentral region. EEG showed continuous generalized 5 to 7 Hz theta as well as intermittent generalized 2 to 3 Hz delta slowing. Hyperventilation and photic stimulation were not performed.   ABNORMALITY - Continuous slow, generalized IMPRESSION: This study is suggestive of moderate diffuse encephalopathy, nonspecific etiology. No seizures or epileptiform discharges were seen throughout the recording. Lora Havens   CT HEAD CODE STROKE WO CONTRAST  Result Date: 05/28/2021 CLINICAL DATA:  Code stroke.  Neuro deficit, acute, stroke suspected EXAM: CT HEAD WITHOUT  CONTRAST TECHNIQUE: Contiguous axial images were obtained from the base of the skull through the vertex without intravenous contrast. COMPARISON:  Multiple priors, most recent 09/11/2020. FINDINGS: Brain: No evidence of acute infarction, hemorrhage, hydrocephalus, extra-axial collection or mass lesion/mass effect. Similar calcification in bilateral basal ganglia and white matter. Vascular: No hyperdense vessel identified. Skull: No acute fracture. Sinuses/Orbits: Clear sinuses.  No acute orbital findings. Other: Large right mastoid effusion. ASPECTS [facility] Stroke Program Early CT Score) Total score (0-10 with 10 being normal): 10. IMPRESSION: 1. No evidence of acute large vascular territory infarct or acute hemorrhage. ASPECTS is 10. 2. Chronic basal ganglia and white matter calcifications. 3. Large right mastoid effusion with middle ear fluid. Correlate with the presence or absence of signs/symptoms of otomastoiditis. Code stroke imaging results were communicated on 05/28/2021 at 12:01 pm  to provider [facility] via telephone. Electronically Signed   By: Margaretha Sheffield M.D.   On: 05/28/2021 12:04    Scheduled Meds:  allopurinol  100 mg Oral Daily   vitamin C  250 mg Oral Daily   atorvastatin  5 mg Oral QHS   cycloSPORINE  1 drop Both Eyes BID   enoxaparin (LOVENOX) injection  40 mg Subcutaneous Q24H   [START ON 05/30/2021] furosemide  20 mg Oral QODAY   hydrocerin   Topical BID   insulin aspart  0-9 Units Subcutaneous TID WC   multivitamin with minerals  1 tablet Oral Daily   [START ON 05/30/2021] potassium chloride  10 mEq Oral QODAY   sodium chloride flush  3 mL Intravenous Q12H   triamcinolone cream   Topical BID   vitamin B-12  1,000 mcg Oral Daily   Continuous Infusions:  cefTRIAXone (ROCEPHIN)  IV Stopped (05/29/21 1200)   levETIRAcetam Stopped (05/29/21 0919)     LOS: 1 day   Time spent: 47 mins  Malya Cirillo Wynetta Emery, MD How to contact the [facility] Attending or Consulting provider [place] or  covering provider during after hours [place], for this patient?  Check the care team in [facility] and look for a) attending/consulting [facility] provider listed and b) the [facility] team listed Log into [email or web address] and use [place]'s universal password to access. If you do not have the password, please contact the hospital operator. Locate the [facility] provider you are looking for under [facility] and page to a number that you can be directly reached. If you still have difficulty reaching the provider, please page the [facility] (Director on Call) for the Hospitalists listed on amion for assistance.  05/29/2021, 4:20 PM

## 2021-05-29 NOTE — Progress Notes (Signed)
SLP Cancellation Note  Patient Details Name: [name] MRN: [id number] DOB: 1946-02-10   Cancelled treatment:       Reason Eval/Treat Not Completed: Fatigue/lethargy limiting ability to participate. Attempted to complete SLE this afternoon, however Pt was unable to maintain alertness to participate. ST will continue efforts.   Maat Kafer H. Roddie Mc, CCC-SLP Speech Language Pathologist    Wende Bushy 05/29/2021, 3:30 PM

## 2021-05-29 NOTE — TOC Initial Note (Addendum)
Transition of Care [facility]) - Initial/Assessment Note    Patient Details  Name: [name] MRN: [id number] Date of Birth: 08-Jan-1946  [facility] CM/SW Contact:    Ihor Gully, LCSW Phone Number: 05/29/2021, 12:44 PM  Clinical Narrative:                 Patient is a LTC resident at [facility]. Patient feeds himself. He uses a lift for transfers to wc. He is total care for ADLs. He uses 3L oxygen at baseline. Debbie at [facility] advised and is agreeable for weekend discharge.   Expected Discharge Plan: Skilled Nursing Facility Barriers to Discharge: Continued Medical Work up   Patient Goals and CMS Choice        Expected Discharge Plan and Services Expected Discharge Plan: [place] arrangements for the past 2 months: [place]                                      Prior Living Arrangements/Services Living arrangements for the past 2 months: [place] Lives with:: Facility Resident Patient language and need for interpreter reviewed:: Yes        Need for Family Participation in Patient Care: Yes (Comment) Care giver support system in place?: Yes (comment)   Criminal Activity/Legal Involvement Pertinent to Current Situation/Hospitalization: No - Comment as needed  Activities of Daily Living Home Assistive Devices/Equipment: Hospital bed, Wheelchair ADL Screening (condition at time of admission) Patient's cognitive ability adequate to safely complete daily activities?: No Is the patient deaf or have difficulty hearing?: No Does the patient have difficulty seeing, even when wearing glasses/contacts?: No Does the patient have difficulty concentrating, remembering, or making decisions?: Yes Patient able to express need for assistance with ADLs?: No Does the patient have difficulty dressing or bathing?: Yes Independently performs ADLs?: No Communication: Independent Dressing (OT): Dependent Is  this a change from baseline?: Pre-admission baseline Grooming: Dependent Is this a change from baseline?: Pre-admission baseline Feeding: Needs assistance Is this a change from baseline?: Pre-admission baseline Bathing: Dependent Is this a change from baseline?: Pre-admission baseline Toileting: Dependent Is this a change from baseline?: Pre-admission baseline In/Out Bed: Dependent Walks in Home: Dependent Does the patient have difficulty walking or climbing stairs?: Yes Weakness of Legs: Both Weakness of Arms/Hands: Both  Permission Sought/Granted                  Emotional Assessment       Orientation: : Oriented to Self Alcohol / Substance Use: Not Applicable Psych Involvement: No (comment)  Admission diagnosis:  Transient alteration of awareness [R40.4] Slurred speech [R47.81] Seizure ([place]) [R56.9] Seizure-like activity ([place]) [R56.9] Patient Active Problem List   Diagnosis Date Noted   Seizure ([place]) 62/37/6283   Acute metabolic encephalopathy 15/17/6160   Mastoiditis, right: Per CT head 05/28/2021   Sepsis ([place]) 03/03/2021   Aspiration pneumonia of both lower lobes due to gastric secretions ([place]) 03/03/2021   SBO (small bowel obstruction) ([place]) 03/03/2021   Acute lower UTI 03/03/2021   H/O colon cancer, stage III 12/30/2020   Elevated LFTs    Pressure injury of skin 09/07/2020   Bacteremia due to Klebsiella pneumoniae 09/06/2020   AKI (acute kidney injury) ([place]) 09/06/2020   Lactic acidosis 09/06/2020   Mild protein-calorie malnutrition ([place]) 09/06/2020   Septic shock ([place]) 09/06/2020   Cecal cancer (  [place])    Colonic mass 05/09/2019   atrophic Gastritis 05/08/2019   Abdominal pain 05/08/2019   Dementia without behavioral disturbance (HCC)    SOB (shortness of breath) on exertion    Hypotension    Acute blood loss anemia 05/01/2018   Heme positive stool 05/01/2018   Guaiac positive stools    Iron deficiency anemia due to chronic blood loss    GIB  (gastrointestinal bleeding) 04/30/2018   History of Bleeding ulcer    Essential hypertension    Encounter for screening colonoscopy 10/08/2011   CLOSED DISLOCATION OF FOOT UNSPECIFIED PART 01/15/2010   PCP:  Caprice Renshaw, MD Pharmacy:   [facility] [place] [address] [place] [address] Phone: [phone] Fax: [phone]     Social Determinants of Health ([place]) Interventions    Readmission Risk Interventions Readmission Risk Prevention Plan 05/15/2019  Post Dischage Appt Not Complete  Appt Comments Pt returning to SNF. SNF MD will follow up at the facility  Medication Screening Complete  Transportation Screening Complete  Some recent data might be hidden

## 2021-05-29 NOTE — Procedures (Signed)
Patient Name: [name]  MRN: [id number]  Epilepsy Attending: Lora Havens  Referring Physician/Provider: Dr Irine Seal Date: 05/29/2021 Duration: 23.07 mins  Patient history: 75 year old male with altered mental status.  EEG to evaluate for seizures.  Level of alertness: Awake, asleep  AEDs during EEG study: LEV  Technical aspects: This EEG study was done with scalp electrodes positioned according to the 10-20 International system of electrode placement. Electrical activity was acquired at a sampling rate of 500Hz  and reviewed with a high frequency filter of 70Hz  and a low frequency filter of 1Hz . EEG data were recorded continuously and digitally stored.   Description: The posterior dominant rhythm consists of 8 Hz activity of moderate voltage (25-35 uV) seen predominantly in posterior head regions, symmetric and reactive to eye opening and eye closing. Sleep was characterized by sleep spindles (12 to 14 Hz), maximal frontocentral region. EEG showed continuous generalized 5 to 7 Hz theta as well as intermittent generalized 2 to 3 Hz delta slowing. Hyperventilation and photic stimulation were not performed.     ABNORMALITY - Continuous slow, generalized  IMPRESSION: This study is suggestive of moderate diffuse encephalopathy, nonspecific etiology. No seizures or epileptiform discharges were seen throughout the recording.  [name] [name]

## 2021-05-29 NOTE — NC FL2 (Signed)
[place] LEVEL OF CARE SCREENING TOOL     IDENTIFICATION  Patient Name: [name] Birthdate: August 20, 1946 Sex: male Admission Date (Current Location): 05/28/2021  [facility] and [place] Number:  [employer] and Address:  [place] [address] [place]      Provider Number: [id number]  Attending Physician Name and Address:  Murlean Iba, MD  Relative Name and Phone Number:  Claudette Laws (Legal Guardian)   [phone]    Current Level of Care: Hospital Recommended Level of Care: [place] Prior Approval Number:    Date Approved/Denied:   PASRR Number:    Discharge Plan: SNF    Current Diagnoses: Patient Active Problem List   Diagnosis Date Noted   Seizure ([place]) 35/46/5681   Acute metabolic encephalopathy 27/51/7001   Mastoiditis, right: Per CT head 05/28/2021   Sepsis ([place]) 03/03/2021   Aspiration pneumonia of both lower lobes due to gastric secretions ([place]) 03/03/2021   SBO (small bowel obstruction) ([place]) 03/03/2021   Acute lower UTI 03/03/2021   H/O colon cancer, stage III 12/30/2020   Elevated LFTs    Pressure injury of skin 09/07/2020   Bacteremia due to Klebsiella pneumoniae 09/06/2020   AKI (acute kidney injury) ([place]) 09/06/2020   Lactic acidosis 09/06/2020   Mild protein-calorie malnutrition ([place]) 09/06/2020   Septic shock ([place]) 09/06/2020   Cecal cancer ([place])    Colonic mass 05/09/2019   atrophic Gastritis 05/08/2019   Abdominal pain 05/08/2019   Dementia without behavioral disturbance (HCC)    SOB (shortness of breath) on exertion    Hypotension    Acute blood loss anemia 05/01/2018   Heme positive stool 05/01/2018   Guaiac positive stools    Iron deficiency anemia due to chronic blood loss    GIB (gastrointestinal bleeding) 04/30/2018   History of Bleeding ulcer    Essential hypertension    Encounter for screening colonoscopy 10/08/2011   CLOSED DISLOCATION OF FOOT  UNSPECIFIED PART 01/15/2010    Orientation RESPIRATION BLADDER Height & Weight     Self  O2 Incontinent Weight: 217 lb 6 oz (98.6 kg) Height:  6' (182.9 cm)  BEHAVIORAL SYMPTOMS/MOOD NEUROLOGICAL BOWEL NUTRITION STATUS      Incontinent Diet (DYS 3)  AMBULATORY STATUS COMMUNICATION OF NEEDS Skin   Total Care Verbally Normal                       Personal Care Assistance Level of Assistance  Bathing, Feeding, Dressing Bathing Assistance: Maximum assistance Feeding assistance: Limited assistance Dressing Assistance: Maximum assistance     Functional Limitations Info  Sight, Hearing, Speech Sight Info: Adequate Hearing Info: Adequate Speech Info: Adequate    SPECIAL CARE FACTORS FREQUENCY                       Contractures Contractures Info: Not present    Additional Factors Info  Code Status, Allergies Code Status Info: DNR Allergies Info: Hydrocodone, Hydrocodone-acetaminophen, Aplisol           Current Medications (05/29/2021):  This is the current hospital active medication list Current Facility-Administered Medications  Medication Dose Route Frequency Provider Last Rate Last Admin   0.9 %  sodium chloride infusion   Intravenous Continuous Eugenie Filler, MD 75 mL/hr at 05/29/21 0613 New Bag at 05/29/21 7494   acetaminophen (TYLENOL) tablet 650 mg  650 mg Oral Q6H PRN Eugenie Filler, MD  Or   acetaminophen (TYLENOL) suppository 650 mg  650 mg Rectal Q6H PRN Eugenie Filler, MD       allopurinol (ZYLOPRIM) tablet 100 mg  100 mg Oral Daily Eugenie Filler, MD   100 mg at 05/29/21 4854   ascorbic acid (VITAMIN C) tablet 250 mg  250 mg Oral Daily Eugenie Filler, MD   250 mg at 05/29/21 0850   atorvastatin (LIPITOR) tablet 5 mg  5 mg Oral QHS Eugenie Filler, MD   5 mg at 05/28/21 2149   cefTRIAXone (ROCEPHIN) 1 g in sodium chloride 0.9 % 100 mL IVPB  1 g Intravenous Q24H Johnson, Clanford L, MD 200 mL/hr at 05/29/21 1123 1 g at  05/29/21 1123   cycloSPORINE (RESTASIS) 0.05 % ophthalmic emulsion 1 drop  1 drop Both Eyes BID Eugenie Filler, MD   1 drop at 05/29/21 0850   enoxaparin (LOVENOX) injection 40 mg  40 mg Subcutaneous Q24H Eugenie Filler, MD   40 mg at 05/28/21 1746   [START ON 05/30/2021] furosemide (LASIX) tablet 20 mg  20 mg Oral Neville Route, MD       hydrocerin (EUCERIN) cream   Topical BID Murlean Iba, MD   Given at 05/29/21 1204   insulin aspart (novoLOG) injection 0-9 Units  0-9 Units Subcutaneous TID WC Eugenie Filler, MD       ketoconazole (NIZORAL) 2 % shampoo 1 application  1 application Topical O27O PRN Eugenie Filler, MD       levETIRAcetam (KEPPRA) IVPB 500 mg/100 mL premix  500 mg Intravenous Q12H Eugenie Filler, MD 400 mL/hr at 05/29/21 0904 500 mg at 05/29/21 0904   LORazepam (ATIVAN) injection 1 mg  1 mg Intravenous Q2H PRN Eugenie Filler, MD       metoprolol tartrate (LOPRESSOR) injection 5 mg  5 mg Intravenous Q6H PRN Eugenie Filler, MD       multivitamin with minerals tablet 1 tablet  1 tablet Oral Daily Eugenie Filler, MD   1 tablet at 05/29/21 0848   ondansetron (ZOFRAN) tablet 4 mg  4 mg Oral Q6H PRN Eugenie Filler, MD       Or   ondansetron [facility]) injection 4 mg  4 mg Intravenous Q6H PRN Eugenie Filler, MD       polyethylene glycol (MIRALAX / GLYCOLAX) packet 17 g  17 g Oral Daily PRN Eugenie Filler, MD       polyethylene glycol (MIRALAX / GLYCOLAX) packet 17 g  17 g Oral Daily PRN Eugenie Filler, MD       [START ON 05/30/2021] potassium chloride (KLOR-CON) CR tablet 10 mEq  10 mEq Oral Neville Route, MD       sodium chloride flush (NS) 0.9 % injection 3 mL  3 mL Intravenous Q12H Eugenie Filler, MD   3 mL at 05/29/21 0852   sorbitol 70 % solution 30 mL  30 mL Oral Daily PRN Eugenie Filler, MD       triamcinolone cream (KENALOG) 0.1 % cream   Topical BID Murlean Iba, MD   Given at 05/29/21 1202    vitamin B-12 (CYANOCOBALAMIN) tablet 1,000 mcg  1,000 mcg Oral Daily Eugenie Filler, MD   1,000 mcg at 05/29/21 3500     Discharge Medications: Please see discharge summary for a list of discharge medications.  Relevant Imaging Results:  Relevant Lab Results:   Additional  Information SS # [id number]  Ihor Gully, LCSW

## 2021-05-29 NOTE — Consult Note (Signed)
Douglas A. Merlene Laughter, MD     [email or web address]          [name] is an 75 y.o. male.   ASSESSMENT/PLAN:  ACUTE ENCEPHALOPATHY DUE TO UIT AND PARKINSONISM: EEG fine; consider DC Keppra later on; on Sinement; avoid reglan  Remote multiple lacunar infarcts on imaging: Aspirin 325 mg is recommended.  Dementia at baseline   More responsive    GENERAL:  The patient is cooperates with evaluation. He has significant psychomotor slowing. More responsive today  HEENT:  Neck is supple no trauma noted.  ABDOMEN: soft  EXTREMITIES: No edema;  There is a brace on the left knee. There is diffuse arthritic changes noted throughout. There is a left footdrop.   BACK: Normal  SKIN: Normal by inspection.    MENTAL STATUS:  He is awake and cooperates with evaluation. He does follow commands briskly in the midline and the in the extremities. He recognizes that he is that the hospital and names that appropriately. He is not oriented to time or to his medical condition.  CRANIAL NERVES: Pupils are equal, round and reactive to light and accomodation; extra ocular movements are full, there is no significant nystagmus; visual fields are full; upper and lower facial muscles are normal in strength and symmetric, there is no flattening of the nasolabial folds; tongue is midline; Visual fields are intact.  MOTOR:  He has antigravity strength throughout.  COORDINATION:  No tremors are noted. He however has moderate to severe bradykinesia. There is some cogwheeling noted. No mild clonus, negative myoclonus or dyskinesias are noted.     Blood pressure 114/61, pulse 86, temperature 98.9 F (37.2 C), temperature source Oral, resp. rate 16, height 6' (1.829 m), weight 98.6 kg, SpO2 100 %.  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   Dementia    disoriented to time.does not  know medical hx.information obtained from previous charts   Diabetes mellitus without complication (HCC)    GERD (gastroesophageal reflux disease)    Hard of hearing    Hyperlipemia    Hypertension    Iron deficiency anemia    Obesity    SOB (shortness of breath) on exertion     Past Surgical History:  Procedure Laterality Date   BIOPSY  05/09/2019   Procedure: BIOPSY;  Surgeon: Daneil Dolin, MD;  [facility];  Service: Endoscopy;;   COLONOSCOPY  10/06/06   internal hemorrhoids and anal papilla/poor prep   COLONOSCOPY N/A 05/09/2019   Dr. Gala Romney: Cecal adenocarcinoma   CYSTOSCOPY W/ URETERAL STENT PLACEMENT Right 09/08/2020   Procedure: CYSTOSCOPY WITH RETROGRADE PYELOGRAM/URETERAL STENT PLACEMENT;  Surgeon: Irine Seal, MD;  Location: [facility];  Service: Urology;  Laterality: Right;   CYSTOSCOPY/URETEROSCOPY/HOLMIUM LASER/STENT PLACEMENT Right 09/30/2020   Procedure: CYSTOSCOPY RIGHT URETEROSCOPY/HOLMIUM LASER/STENT PLACEMENT;  Surgeon: Irine Seal, MD;  [facility];  Service: Urology;  Laterality: Right;   ESOPHAGOGASTRODUODENOSCOPY N/A 05/01/2018   multiple 3-6 mm gastric nodules and gastritis, surrounding telangectasias. Biopsy with atrophic gastritis.    FOOT SURGERY     KNEE ARTHROSCOPY  06/17/2011   Procedure: ARTHROSCOPY KNEE;  Surgeon: Sanjuana Kava;  Location: [facility];  Service: Orthopedics;  Laterality: Right;   LAPAROSCOPIC PARTIAL COLECTOMY N/A 05/11/2019   Procedure: ATTEMPTED LAPAROSCOPIC PARTIAL COLECTOMY;  Surgeon: [name], MD;  Location: [facility];  Service: General;  Laterality: N/A;   PARTIAL COLECTOMY  05/11/2019   Procedure: PARTIAL COLECTOMY (Laparotomy incision at 0837;  Surgeon: [name], MD;  Location: [facility];  Service: General;;   POLYPECTOMY  05/09/2019   Procedure: POLYPECTOMY;  Surgeon: Daneil Dolin, MD;  [facility];  Service: Endoscopy;;    Family History  Problem Relation Age of Onset   COPD Mother    Cancer  Brother    Anesthesia problems Neg Hx    Hypotension Neg Hx    Malignant hyperthermia Neg Hx    Pseudochol deficiency Neg Hx    Colon cancer Neg Hx     Social History:  reports that he has quit smoking. His smoking use included cigarettes. He has a 30.00 pack-year smoking history. He has never used smokeless tobacco. He reports that he does not drink alcohol and does not use drugs.  Allergies:  Allergies  Allergen Reactions   Hydrocodone    Hydrocodone-Acetaminophen     unknown   Aplisol [Tuberculin Ppd] Rash    Medications: Prior to Admission medications   Medication Sig Start Date End Date Taking? Authorizing Provider  allopurinol (ZYLOPRIM) 100 MG tablet Take 100 mg by mouth daily.   Yes [provider]  Ascorbic Acid (VITAMIN C PO) Take 1 tablet by mouth daily.   Yes [provider]  atorvastatin (LIPITOR) 10 MG tablet Take 5 mg by mouth at bedtime.    Yes [provider]  feeding supplement (ENSURE ENLIVE / ENSURE PLUS) LIQD Take 237 mLs by mouth 2 (two) times daily between meals. 09/11/20  Yes Hongalgi, Lenis Dickinson, MD  furosemide (LASIX) 20 MG tablet Take 1 tablet (20 mg total) by mouth every other day. 03/11/21  Yes Johnson, Clanford L, MD  metFORMIN (GLUCOPHAGE) 500 MG tablet Take 500 mg by mouth at bedtime.   Yes [provider]  Multiple Vitamin (MULTIVITAMIN) tablet Take 1 tablet by mouth daily.   Yes [provider]  polyethylene glycol (MIRALAX / GLYCOLAX) 17 g packet Take 17 g by mouth daily as needed for mild constipation. 03/09/21  Yes Johnson, Clanford L, MD  potassium chloride (KLOR-CON) 10 MEQ tablet Take 1 tablet (10 mEq total) by mouth every other day. 03/11/21  Yes Johnson, Clanford L, MD  acetaminophen (TYLENOL) 650 MG CR tablet Take 1 tablet (650 mg total) by mouth every 8 (eight) hours as needed for pain or fever. 09/11/20   Hongalgi, Lenis Dickinson, MD  cholecalciferol (VITAMIN D3) 25 MCG (1000 UNIT) tablet Take 1,000 Units by  mouth daily.    [provider]  clobetasol cream (TEMOVATE) 7.49 % Apply 1 application topically 2 (two) times daily.    [provider]  Colloidal Oatmeal (EUCERIN ECZEMA RELIEF) 1 % CREA Apply 1 application topically 2 (two) times daily. Apply to face and neck    [provider]  cycloSPORINE (RESTASIS) 0.05 % ophthalmic emulsion Place 1 drop into both eyes 2 (two) times daily.    [provider]  ketoconazole (NIZORAL) 2 % shampoo Apply 1 application topically every 12 (twelve) hours as needed for irritation. Apply to Scalp    [provider]  metoCLOPramide (REGLAN) 5 MG tablet Take 1 tablet (5 mg total) by mouth 3 (three) times daily before meals. 03/09/21   Johnson, Clanford L, MD  ondansetron (ZOFRAN) 4 MG tablet Take 4 mg by mouth every 6 (six) hours as needed for nausea or vomiting.    [provider]  vitamin B-12 (CYANOCOBALAMIN) 1000 MCG tablet Take 1,000 mcg by mouth daily.    [provider]    Scheduled Meds:  allopurinol  100 mg Oral Daily   vitamin C  250 mg Oral Daily   atorvastatin  5 mg Oral QHS   cycloSPORINE  1 drop Both Eyes BID   enoxaparin (LOVENOX) injection  40 mg Subcutaneous Q24H   [START ON 05/30/2021] furosemide  20 mg Oral QODAY   hydrocerin   Topical BID   insulin aspart  0-9 Units Subcutaneous TID WC   multivitamin with minerals  1 tablet Oral Daily   [START ON 05/30/2021] potassium chloride  10 mEq Oral QODAY   sodium chloride flush  3 mL Intravenous Q12H   triamcinolone cream   Topical BID   vitamin B-12  1,000 mcg Oral Daily   Continuous Infusions:  cefTRIAXone (ROCEPHIN)  IV Stopped (05/29/21 1200)   levETIRAcetam Stopped (05/29/21 0919)   PRN Meds:.acetaminophen **OR** acetaminophen, ketoconazole, LORazepam, metoprolol tartrate, ondansetron **OR** ondansetron (ZOFRAN) IV, polyethylene glycol, polyethylene glycol, sorbitol     Results for orders placed or performed during the hospital  encounter of 05/28/21 (from the past 48 hour(s))  CBG monitoring, ED     Status: Abnormal   Collection Time: 05/28/21 11:44 AM  Result Value Ref Range   Glucose-Capillary 108 (H) 70 - 99 mg/dL    Comment: Glucose reference range applies only to samples taken after fasting for at least 8 hours.  I-stat chem 8, ED     Status: Abnormal   Collection Time: 05/28/21 11:47 AM  Result Value Ref Range   Sodium 139 135 - 145 mmol/L   Potassium 4.1 3.5 - 5.1 mmol/L   Chloride 99 98 - 111 mmol/L   BUN 16 8 - 23 mg/dL   Creatinine, Ser 0.80 0.61 - 1.24 mg/dL   Glucose, Bld 104 (H) 70 - 99 mg/dL    Comment: Glucose reference range applies only to samples taken after fasting for at least 8 hours.   Calcium, Ion 1.17 1.15 - 1.40 mmol/L   TCO2 30 22 - 32 mmol/L   Hemoglobin 13.3 13.0 - 17.0 g/dL   HCT 39.0 39.0 - 52.0 %  Ethanol     Status: None   Collection Time: 05/28/21 11:47 AM  Result Value Ref Range   Alcohol, Ethyl (B) <10 <10 mg/dL    Comment: (NOTE) Lowest detectable limit for serum alcohol is 10 mg/dL.  For medical purposes only. Performed at [facility], [address] [place], [place] [address]   Protime-INR     Status: None   Collection Time: 05/28/21 11:47 AM  Result Value Ref Range   Prothrombin Time 13.7 11.4 - 15.2 seconds   INR 1.1 0.8 - 1.2    Comment: (NOTE) INR goal varies based on device and disease states. Performed at [facility], [address], [place], [place] [address]   APTT     Status: None   Collection Time: 05/28/21 11:47 AM  Result Value Ref Range   aPTT 25 24 - 36 seconds    Comment: Performed at [facility], [address] [place], [place] [address]  CBC     Status: Abnormal   Collection Time: 05/28/21 11:47 AM  Result Value Ref Range   WBC 5.6 4.0 - 10.5 K/uL   RBC 4.34 4.22 - 5.81 MIL/uL   Hemoglobin 12.7 (L) 13.0 - 17.0 g/dL   HCT 38.9 (L) 39.0 - 52.0 %  MCV 89.6 80.0 - 100.0 fL   MCH 29.3 26.0 - 34.0 pg   MCHC 32.6 30.0 - 36.0 g/dL   RDW  13.1 11.5 - 15.5 %   Platelets 168 150 - 400 K/uL   nRBC 0.0 0.0 - 0.2 %    Comment: Performed at [facility], [address] [place], [place] [address]  Differential     Status: None   Collection Time: 05/28/21 11:47 AM  Result Value Ref Range   Neutrophils Relative % 52 %   Neutro Abs 2.9 1.7 - 7.7 K/uL   Lymphocytes Relative 35 %   Lymphs Abs 2.0 0.7 - 4.0 K/uL   Monocytes Relative 9 %   Monocytes Absolute 0.5 0.1 - 1.0 K/uL   Eosinophils Relative 3 %   Eosinophils Absolute 0.2 0.0 - 0.5 K/uL   Basophils Relative 1 %   Basophils Absolute 0.0 0.0 - 0.1 K/uL   Immature Granulocytes 0 %   Abs Immature Granulocytes 0.01 0.00 - 0.07 K/uL    Comment: Performed at [facility], [address], [place], [place] [address]  Comprehensive metabolic panel     Status: Abnormal   Collection Time: 05/28/21 11:47 AM  Result Value Ref Range   Sodium 135 135 - 145 mmol/L   Potassium 3.8 3.5 - 5.1 mmol/L   Chloride 100 98 - 111 mmol/L   CO2 29 22 - 32 mmol/L   Glucose, Bld 105 (H) 70 - 99 mg/dL    Comment: Glucose reference range applies only to samples taken after fasting for at least 8 hours.   BUN 16 8 - 23 mg/dL   Creatinine, Ser 0.78 0.61 - 1.24 mg/dL   Calcium 9.1 8.9 - 10.3 mg/dL   Total Protein 7.2 6.5 - 8.1 g/dL   Albumin 3.7 3.5 - 5.0 g/dL   AST 25 15 - 41 U/L   ALT 25 0 - 44 U/L   Alkaline Phosphatase 110 38 - 126 U/L   Total Bilirubin 0.5 0.3 - 1.2 mg/dL   GFR, Estimated >60 >60 mL/min    Comment: (NOTE) Calculated using the CKD-EPI Creatinine Equation (2021)    Anion gap 6 5 - 15    Comment: Performed at [facility], [address] [place], [place] [address]  Magnesium     Status: None   Collection Time: 05/28/21 11:47 AM  Result Value Ref Range   Magnesium 1.9 1.7 - 2.4 mg/dL    Comment: Performed at [facility], [address], [place], [place] [address]  Phosphorus     Status: None   Collection Time: 05/28/21 11:47 AM  Result Value Ref Range   Phosphorus 3.2 2.5 -  4.6 mg/dL    Comment: Performed at [facility], [address], [place], [place] [address]  Hemoglobin A1c     Status: None   Collection Time: 05/28/21 11:47 AM  Result Value Ref Range   Hgb A1c MFr Bld 5.6 4.8 - 5.6 %    Comment: (NOTE) Pre diabetes:          5.7%-6.4%  Diabetes:              >6.4%  Glycemic control for   <7.0% adults with diabetes    Mean Plasma Glucose 114.02 mg/dL    Comment: Performed at [facility] [address] [place],  [address]  Resp Panel by RT-PCR (Flu A&B, Covid) Nasopharyngeal Swab     Status: None   Collection Time: 05/28/21 12:08  PM   Specimen: Nasopharyngeal Swab; Nasopharyngeal(NP) swabs in vial transport medium  Result Value Ref Range   SARS Coronavirus 2 by RT PCR NEGATIVE NEGATIVE    Comment: (NOTE) SARS-CoV-2 target nucleic acids are NOT DETECTED.  The SARS-CoV-2 RNA is generally detectable in upper respiratory specimens during the acute phase of infection. The lowest concentration of SARS-CoV-2 viral copies this assay can detect is 138 copies/mL. A negative result does not preclude SARS-Cov-2 infection and should not be used as the sole basis for treatment or other patient management decisions. A negative result may occur with  improper specimen collection/handling, submission of specimen other than nasopharyngeal swab, presence of viral mutation(s) within the areas targeted by this assay, and inadequate number of viral copies(<138 copies/mL). A negative result must be combined with clinical observations, patient history, and epidemiological information. The expected result is Negative.  Fact Sheet for Patients:  EntrepreneurPulse.com.au  Fact Sheet for Healthcare Providers:  IncredibleEmployment.be  This test is no t yet approved or cleared by the [place] FDA and  has been authorized for detection and/or diagnosis of SARS-CoV-2 by FDA under an Emergency Use Authorization  (EUA). This EUA will remain  in effect (meaning this test can be used) for the duration of the COVID-19 declaration under Section 564(b)(1) of the Act, 21 U.S.C.section 360bbb-3(b)(1), unless the authorization is terminated  or revoked sooner.       Influenza A by PCR NEGATIVE NEGATIVE   Influenza B by PCR NEGATIVE NEGATIVE    Comment: (NOTE) The Xpert Xpress SARS-CoV-2/FLU/RSV plus assay is intended as an aid in the diagnosis of influenza from Nasopharyngeal swab specimens and should not be used as a sole basis for treatment. Nasal washings and aspirates are unacceptable for Xpert Xpress SARS-CoV-2/FLU/RSV testing.  Fact Sheet for Patients: EntrepreneurPulse.com.au  Fact Sheet for Healthcare Providers: IncredibleEmployment.be  This test is not yet approved or cleared by the [place] FDA and has been authorized for detection and/or diagnosis of SARS-CoV-2 by FDA under an Emergency Use Authorization (EUA). This EUA will remain in effect (meaning this test can be used) for the duration of the COVID-19 declaration under Section 564(b)(1) of the Act, 21 U.S.C. section 360bbb-3(b)(1), unless the authorization is terminated or revoked.  Performed at [facility], [address] [place], [place] [address]   Blood gas, venous (at [facility] and [facility], not at [facility])     Status: Abnormal   Collection Time: 05/28/21 12:34 PM  Result Value Ref Range   FIO2 21.00    pH, Ven 7.395 7.250 - 7.430   pCO2, Ven 51.9 44.0 - 60.0 mmHg   pO2, Ven <31.0 (LL) 32.0 - 45.0 mmHg    Comment: CRITICAL RESULT CALLED TO, READ BACK BY AND VERIFIED WITH: DAVIS,N AT 1300 ON 9.1.22 BY RUCINSKI,B    Bicarbonate 27.8 20.0 - 28.0 mmol/L   Acid-Base Excess 6.3 (H) 0.0 - 2.0 mmol/L   O2 Saturation 31.6 %   Patient temperature 37.1     Comment: Performed at [facility], [address], [place], [place] [address]  CBG monitoring, ED     Status: Abnormal   Collection Time: 05/28/21   5:50 PM  Result Value Ref Range   Glucose-Capillary 103 (H) 70 - 99 mg/dL    Comment: Glucose reference range applies only to samples taken after fasting for at least 8 hours.  Glucose, capillary     Status: Abnormal   Collection Time: 05/28/21 11:11 PM  Result Value Ref Range  Glucose-Capillary 100 (H) 70 - 99 mg/dL    Comment: Glucose reference range applies only to samples taken after fasting for at least 8 hours.  Glucose, capillary     Status: None   Collection Time: 05/29/21 12:51 AM  Result Value Ref Range   Glucose-Capillary 79 70 - 99 mg/dL    Comment: Glucose reference range applies only to samples taken after fasting for at least 8 hours.   Comment 1 Notify RN    Comment 2 Document in Chart   Comprehensive metabolic panel     Status: None   Collection Time: 05/29/21  5:43 AM  Result Value Ref Range   Sodium 138 135 - 145 mmol/L   Potassium 3.6 3.5 - 5.1 mmol/L   Chloride 101 98 - 111 mmol/L   CO2 27 22 - 32 mmol/L   Glucose, Bld 95 70 - 99 mg/dL    Comment: Glucose reference range applies only to samples taken after fasting for at least 8 hours.   BUN 17 8 - 23 mg/dL   Creatinine, Ser 0.71 0.61 - 1.24 mg/dL   Calcium 8.9 8.9 - 10.3 mg/dL   Total Protein 6.8 6.5 - 8.1 g/dL   Albumin 3.6 3.5 - 5.0 g/dL   AST 21 15 - 41 U/L   ALT 22 0 - 44 U/L   Alkaline Phosphatase 103 38 - 126 U/L   Total Bilirubin 1.0 0.3 - 1.2 mg/dL   GFR, Estimated >60 >60 mL/min    Comment: (NOTE) Calculated using the CKD-EPI Creatinine Equation (2021)    Anion gap 10 5 - 15    Comment: Performed at [facility], [address], [place], [place] [address]  CBC     Status: Abnormal   Collection Time: 05/29/21  5:43 AM  Result Value Ref Range   WBC 5.4 4.0 - 10.5 K/uL   RBC 4.21 (L) 4.22 - 5.81 MIL/uL   Hemoglobin 12.1 (L) 13.0 - 17.0 g/dL   HCT 38.0 (L) 39.0 - 52.0 %   MCV 90.3 80.0 - 100.0 fL   MCH 28.7 26.0 - 34.0 pg   MCHC 31.8 30.0 - 36.0 g/dL   RDW 13.2 11.5 - 15.5 %   Platelets  156 150 - 400 K/uL   nRBC 0.0 0.0 - 0.2 %    Comment: Performed at [facility], [address] [place], [place] [address]  Vitamin B12     Status: None   Collection Time: 05/29/21  5:43 AM  Result Value Ref Range   Vitamin B-12 255 180 - 914 pg/mL    Comment: (NOTE) This assay is not validated for testing neonatal or myeloproliferative syndrome specimens for Vitamin B12 levels. Performed at [facility], [address] [place], [place] [address]   Folate     Status: None   Collection Time: 05/29/21  5:43 AM  Result Value Ref Range   Folate 8.1 >5.9 ng/mL    Comment: Performed at [facility], [address] [place], [place] [address]  Iron and TIBC     Status: None   Collection Time: 05/29/21  5:43 AM  Result Value Ref Range   Iron 75 45 - 182 ug/dL   TIBC 359 250 - 450 ug/dL   Saturation Ratios 21 17.9 - 39.5 %   UIBC 284 ug/dL    Comment: Performed at [facility], [address], [place], [place] [address]  Ferritin     Status: Abnormal   Collection Time: 05/29/21  5:43  AM  Result Value Ref Range   Ferritin 9 (L) 24 - 336 ng/mL    Comment: Performed at [facility], [address] [place], [place] [address]  Urine rapid drug screen (hosp performed)     Status: None   Collection Time: 05/29/21  6:55 AM  Result Value Ref Range   Opiates NONE DETECTED NONE DETECTED   Cocaine NONE DETECTED NONE DETECTED   Benzodiazepines NONE DETECTED NONE DETECTED   Amphetamines NONE DETECTED NONE DETECTED   Tetrahydrocannabinol NONE DETECTED NONE DETECTED   Barbiturates NONE DETECTED NONE DETECTED    Comment: (NOTE) DRUG SCREEN FOR MEDICAL PURPOSES ONLY.  IF CONFIRMATION IS NEEDED FOR ANY PURPOSE, NOTIFY LAB WITHIN 5 DAYS.  LOWEST DETECTABLE LIMITS FOR URINE DRUG SCREEN Drug Class                     Cutoff (ng/mL) Amphetamine and metabolites    1000 Barbiturate and metabolites    200 Benzodiazepine                 761 Tricyclics and metabolites     300 Opiates and metabolites         300 Cocaine and metabolites        300 THC                            50 Performed at [facility], [address], [place], [place] [address]   Urinalysis, Routine w reflex microscopic Urine, Clean Catch     Status: Abnormal   Collection Time: 05/29/21  6:55 AM  Result Value Ref Range   Color, Urine YELLOW YELLOW   APPearance CLEAR CLEAR   Specific Gravity, Urine 1.020 1.005 - 1.030   pH 6.0 5.0 - 8.0   Glucose, UA NEGATIVE NEGATIVE mg/dL   Hgb urine dipstick NEGATIVE NEGATIVE   Bilirubin Urine NEGATIVE NEGATIVE   Ketones, ur NEGATIVE NEGATIVE mg/dL   Protein, ur NEGATIVE NEGATIVE mg/dL   Nitrite POSITIVE (A) NEGATIVE   Leukocytes,Ua LARGE (A) NEGATIVE    Comment: Performed at [facility], [address], [place], [place] [address]  MRSA Next Gen by PCR, Nasal     Status: Abnormal   Collection Time: 05/29/21  6:55 AM   Specimen: Nasal Mucosa; Nasal Swab  Result Value Ref Range   MRSA by PCR Next Gen DETECTED (A) NOT DETECTED    Comment: RESULT CALLED TO, READ BACK BY AND VERIFIED WITH: HUMPHRIES J. @ [id number] ON [id number] BY HENDERSON L. (NOTE) The GeneXpert MRSA Assay (FDA approved for NASAL specimens only), is one component of a comprehensive MRSA colonization surveillance program. It is not intended to diagnose MRSA infection nor to guide or monitor treatment for MRSA infections. Test performance is not FDA approved in patients less than 3 years old. Performed at [facility], [address] [place], [place] [address]   Urinalysis, Microscopic (reflex)     Status: Abnormal   Collection Time: 05/29/21  6:55 AM  Result Value Ref Range   RBC / HPF NONE SEEN 0 - 5 RBC/hpf   WBC, UA >50 0 - 5 WBC/hpf   Bacteria, UA MANY (A) NONE SEEN   Squamous Epithelial / LPF NONE SEEN 0 - 5    Comment: Performed at [facility], [address], [place], [place] [address]  Glucose, capillary     Status: None   Collection Time: 05/29/21  7:37 AM  Result Value  Ref Range   Glucose-Capillary 89  70 - 99 mg/dL    Comment: Glucose reference range applies only to samples taken after fasting for at least 8 hours.  Glucose, capillary     Status: Abnormal   Collection Time: 05/29/21 11:16 AM  Result Value Ref Range   Glucose-Capillary 100 (H) 70 - 99 mg/dL    Comment: Glucose reference range applies only to samples taken after fasting for at least 8 hours.  Culture, blood (routine x 2)     Status: None (Preliminary result)   Collection Time: 05/29/21 11:35 AM   Specimen: Right Antecubital; Blood  Result Value Ref Range   Specimen Description      RIGHT ANTECUBITAL BOTTLES DRAWN AEROBIC AND ANAEROBIC   Special Requests      Blood Culture results may not be optimal due to an excessive volume of blood received in culture bottles Performed at [facility], [address] [place], [place] [address]    Culture PENDING    Report Status PENDING   Culture, blood (routine x 2)     Status: None (Preliminary result)   Collection Time: 05/29/21 11:45 AM   Specimen: BLOOD LEFT ARM  Result Value Ref Range   Specimen Description BLOOD LEFT ARM BOTTLES DRAWN AEROBIC AND ANAEROBIC    Special Requests      Blood Culture results may not be optimal due to an inadequate volume of blood received in culture bottles Performed at [facility], [address] [place], [place] [address]    Culture PENDING    Report Status PENDING     Studies/Results:     HEAD CT FINDINGS: The study is mildly motion degraded.   Brain: There is no evidence of an acute infarct, intracranial hemorrhage, mass, midline shift, or extra-axial fluid collection. Mild trace diffusion weighted signal hyperintensity involving bilateral cerebral hemispheric white matter extending along the corticospinal tracts through the posterior limbs of the internal capsules and into the midbrain as well as in the splenium of the corpus callosum is chronic based on the prior MRI. There is also mild diffusion weighted signal abnormality  in the superior and middle cerebellar peduncles.   Susceptibility artifact in the basal ganglia and white matter correspond to longstanding calcifications on CT. Patchy T2 hyperintensities in the cerebral white matter bilaterally are nonspecific but compatible with mild-to-moderate chronic small vessel ischemic disease. The ventricles and sulci are within normal limits for age.   Vascular: Major intracranial vascular flow voids are preserved.   Skull and upper cervical spine: No suspicious marrow lesion.   Sinuses/Orbits: Bilateral cataract extraction. Clear paranasal sinuses. Large right mastoid and middle ear effusions.   Other: None.   IMPRESSION: 1. No acute intracranial abnormality. 2. Mild-to-moderate chronic small vessel ischemic disease. 3. Large right mastoid and middle ear effusions.     EEG Description: The posterior dominant rhythm consists of 8 Hz activity of moderate voltage (25-35 uV) seen predominantly in posterior head regions, symmetric and reactive to eye opening and eye closing. Sleep was characterized by sleep spindles (12 to 14 Hz), maximal frontocentral region. EEG showed continuous generalized 5 to 7 Hz theta as well as intermittent generalized 2 to 3 Hz delta slowing. Hyperventilation and photic stimulation were not performed.      ABNORMALITY - Continuous slow, generalized   IMPRESSION: This study is suggestive of moderate diffuse encephalopathy, nonspecific etiology. No seizures or epileptiform discharges were seen throughout the recording.       Shalene Gallen A. Merlene [name], M.D.  Diplomate, Tax adviser of Psychiatry and Neurology ( Neurology). 05/29/2021, 4:06 PM

## 2021-05-29 NOTE — Evaluation (Signed)
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   Dementia    disoriented to time.does not know medical hx.information obtained from previous charts   Diabetes mellitus without complication (HCC)    GERD (gastroesophageal reflux disease)    Hard of hearing    Hyperlipemia    Hypertension    Iron deficiency anemia    Obesity    SOB (shortness of breath) on exertion    Past Surgical History:  Past Surgical History:  Procedure Laterality Date   BIOPSY  05/09/2019   Procedure: BIOPSY;  Surgeon: Daneil Dolin, MD;  Location: [facility];  Service: Endoscopy;;   COLONOSCOPY  10/06/06   internal hemorrhoids and anal papilla/poor prep   COLONOSCOPY N/A 05/09/2019   Dr. Gala Romney: Cecal adenocarcinoma   CYSTOSCOPY W/ URETERAL STENT PLACEMENT Right 09/08/2020   Procedure: CYSTOSCOPY WITH RETROGRADE PYELOGRAM/URETERAL STENT PLACEMENT;  Surgeon: Irine Seal, MD;  Location: [facility];  Service: Urology;  Laterality: Right;   CYSTOSCOPY/URETEROSCOPY/HOLMIUM LASER/STENT PLACEMENT Right 09/30/2020   Procedure: CYSTOSCOPY RIGHT URETEROSCOPY/HOLMIUM LASER/STENT PLACEMENT;  Surgeon: Irine Seal, MD;  Location: [facility];  Service: Urology;  Laterality: Right;   ESOPHAGOGASTRODUODENOSCOPY N/A 05/01/2018   multiple 3-6 mm gastric nodules and gastritis, surrounding telangectasias. Biopsy with atrophic gastritis.    FOOT SURGERY     KNEE ARTHROSCOPY  06/17/2011   Procedure: ARTHROSCOPY KNEE;  Surgeon: Sanjuana Kava;  Location: [facility];  Service: Orthopedics;  Laterality: Right;   LAPAROSCOPIC PARTIAL COLECTOMY N/A 05/11/2019   Procedure:  ATTEMPTED LAPAROSCOPIC PARTIAL COLECTOMY;  Surgeon: [name], MD;  Location: [facility];  Service: General;  Laterality: N/A;   PARTIAL COLECTOMY  05/11/2019   Procedure: PARTIAL COLECTOMY (Laparotomy incision at [place];  Surgeon: [name], MD;  Location: [facility];  Service: General;;   POLYPECTOMY  05/09/2019   Procedure: POLYPECTOMY;  Surgeon: Daneil Dolin, MD;  [facility]: [facility];  Service: Endoscopy;;   HPI:  ADD [name] is a 75 y.o. male living at nursing facility, with medical history significant of dementia, nonambulatory status requiring assistance with ADLs, vitamin B12 deficiency, type 2 diabetes, muscle weakness who is usually alert and oriented and following simple commands at baseline presenting to the ED with altered mental status that started on the morning of admission.  Patient with a baseline dementia and as such history obtained per ED physician and per telemetry neurologist note.  Neurology note reports acute enchephalopathy of unclear etiology.   Assessment / Plan / Recommendation Clinical Impression  Clinical swallowing evaluation completed while Pt was sitting upright in bed; Pt was lethargic and required mod verbal and tactile cues to rouse to be appropriate for PO and continued to require cues throughout trials to remain alert. Note mild xerostomia; Pt consumed thin liquids, puree and soft textures without overt s/sx of aspiration. Oral stage with soft textures was appropriate despite lean to the R and lethargy and palpation indicates timely swallow trigger. Recommend continue with D3/soft textures and thin liquids. Recommend meds be crushed in puree. There are no further needs for dysphagia at  this time. Acknowledge order for Speech language evaluation also; ST will complete later today. Thank you, SLP Visit Diagnosis: Dysphagia, unspecified (R13.10)    Aspiration Risk  Mild aspiration risk    Diet Recommendation Dysphagia 3 (Mech soft);Thin liquid    Liquid Administration via: Cup;Straw Medication Administration: Crushed with puree Supervision: Full supervision/cueing for compensatory strategies Compensations: Slow rate;Small sips/bites Postural Changes: Seated upright at 90 degrees    Other  Recommendations Oral Care Recommendations: Oral care BID   Follow up Recommendations Skilled Nursing facility        Swallow Study   General Date of Onset: 05/28/21 HPI: [name] is a 75 y.o. male living at nursing facility, with medical history significant of dementia, nonambulatory status requiring assistance with ADLs, vitamin B12 deficiency, type 2 diabetes, muscle weakness who is usually alert and oriented and following simple commands at baseline presenting to the ED with altered mental status that started on the morning of admission.  Patient with a baseline dementia and as such history obtained per ED physician and per telemetry neurologist note.  Neurology note reports acute enchephalopathy of unclear etiology. Type of Study: Bedside Swallow Evaluation Previous Swallow Assessment: BSE 08/2020 Diet Prior to this Study: Dysphagia 3 (soft);Thin liquids Temperature Spikes Noted: No Respiratory Status: Room air History of Recent Intubation: No Behavior/Cognition: Lethargic/Drowsy;Cooperative;Pleasant mood Oral Cavity Assessment: Dry Oral Care Completed by SLP: Recent completion by staff Self-Feeding Abilities: Needs set up;Total assist Patient Positioning: Upright in bed Baseline Vocal Quality: Normal Volitional Cough: Strong Volitional Swallow: Able to elicit    Oral/Motor/Sensory Function Overall Oral Motor/Sensory Function: Mild impairment Facial ROM: Reduced left Facial Symmetry: Abnormal symmetry left   Ice Chips Ice chips: Within functional limits   Thin Liquid Thin Liquid: Within functional limits    Nectar Thick Nectar Thick Liquid: Not tested   Honey Thick Honey Thick Liquid: Not tested   Puree Puree: Within  functional limits   Solid     Solid: Within functional limits     Doretta Remmert H. Roddie Mc, CCC-SLP Speech Language Pathologist  Wende Bushy 05/29/2021,9:37 AM

## 2021-05-30 DIAGNOSIS — Z85038 Personal history of other malignant neoplasm of large intestine: Secondary | ICD-10-CM

## 2021-05-30 LAB — BASIC METABOLIC PANEL
Anion gap: 10 (ref 5–15)
BUN: 14 mg/dL (ref 8–23)
CO2: 26 mmol/L (ref 22–32)
Calcium: 8.8 mg/dL — ABNORMAL LOW (ref 8.9–10.3)
Chloride: 103 mmol/L (ref 98–111)
Creatinine, Ser: 0.78 mg/dL (ref 0.61–1.24)
GFR, Estimated: >60 mL/min (ref >60)
Glucose, Bld: 102 mg/dL — ABNORMAL HIGH (ref 70–99)
Potassium: 4 mmol/L (ref 3.5–5.1)
Sodium: 139 mmol/L (ref 135–145)

## 2021-05-30 LAB — GLUCOSE, CAPILLARY
Glucose-Capillary: 81 mg/dL (ref 70–99)
Glucose-Capillary: 109 mg/dL — ABNORMAL HIGH (ref 70–99)

## 2021-05-30 MED ORDER — CEFDINIR 300 MG PO CAPS: 300.0000 mg | ORAL_CAPSULE | Freq: Two times a day (BID) | ORAL | 0 refills | Status: AC

## 2021-05-30 MED ORDER — LEVETIRACETAM 500 MG PO TABS: 500.0000 mg | ORAL_TABLET | Freq: Two times a day (BID) | ORAL | Status: AC

## 2021-05-30 MED ORDER — FUROSEMIDE 20 MG PO TABS: 20.0000 mg | ORAL_TABLET | ORAL | Status: AC

## 2021-05-30 MED ORDER — LEVETIRACETAM 500 MG PO TABS
500.0000 mg | ORAL_TABLET | Freq: Two times a day (BID) | ORAL | Status: DC
  Administered 2021-05-30: 500 mg via ORAL
  Filled 2021-05-30: qty 1

## 2021-05-30 MED ORDER — POTASSIUM CHLORIDE CRYS ER 10 MEQ PO TBCR: 10.0000 meq | EXTENDED_RELEASE_TABLET | ORAL | Status: AC

## 2021-05-30 NOTE — Discharge Summary (Signed)
Physician Discharge Summary  [name]:[id number] DOB: 12-03-1945 DOA: 05/28/2021  PCP: Caprice Renshaw, MD  Admit date: 05/28/2021 Discharge date: 05/30/2021  Admitted From:  [facility] Disposition: [name]   Recommendations for Outpatient Follow-up:  Follow up with Dr. Delton Coombes as scheduled Follow up for outpatient CT scan arranged by Dr. Delton Coombes  Discharge Condition: STABLE   CODE STATUS: DNR DIET:  Dys 3, thin    Brief Hospitalization Summary: Please see all hospital notes, images, labs for full details of the hospitalization. ADMISSION HPI: [name] is a 75 y.o. male living at nursing facility, with medical history significant of dementia, nonambulatory status requiring assistance with ADLs, vitamin B12 deficiency, type 2 diabetes, muscle weakness who is usually alert and oriented and following simple commands at baseline presenting to the ED with altered mental status that started on the morning of admission.  Patient with a baseline dementia and as such history obtained per ED physician and per telemetry neurologist note.  It is noted that patient was seen by nurse practitioner early on in the morning while eating breakfast around 10 AM and appeared relatively normal however when patient was assessed again around 10:30 AM patient noted to be more significantly confused than his baseline noted to be weak in his bilateral upper extremities and was felt as if patient had slightly sluggish left pupil.  It was also noted that patient had some intermittent recurrent episodes where he would be completely unresponsive with a fixed gaze eyes would be open however not responding to verbal or when patient was shaking.  It was also noted that patient did have some breathing changes and thought it was like a postictal breathing with some lipsmacking as well.  Patient with no recent history of fever, no chills, no nausea, no vomiting, no abdominal pain, no diarrhea, no constipation, no  bleeding.   ED Course: Patient seen in the ED and per ED physician patient noted to be more responsive and answering simple questions with some left-sided weakness and slurred speech.  Code stroke was called on presentation.  Head CT done was negative for any evidence of acute large vascular territory infarct or acute hemorrhage.  Chronic basal ganglia white matter calcifications noted.  Large right mastoid effusion with middle ear fluid, correlate with presence or absence of signs or symptoms of otomastoiditis. Patient seen by telemetry neurology and concern was for seizures in the setting of underlying dementia and recommended treating seizures.  Patient given Keppra 1500 mg loading dose x1.  Hospitalist were consulted to admit the patient for further evaluation and management.  Comprehensive metabolic profile obtained within normal limits.  CBC done within normal limits.  Urinalysis pending.  INR 1.1.  Chest x-ray with cardiomegaly with low lung volumes otherwise no evidence of acute cardiopulmonary process.  HOSPITAL COURSE  Acute metabolic encephalopathy-likely multifactorial as he does had evidence of a UTI and we have started him on treatment with ceftriaxone and he is clinically much improved and seems back to his baseline.   He was treated with other supportive measures.  In addition there was concern for epileptic seizure and postictal state.  He has been loaded with Keppra and neurology has been consulted and he remains on keppra 500 mg BID to avoid seizure activity.  He seems to be tolerating it well.  He is back to baseline.  He is eating and drinking well.  Stable to DC back to SNF.  Appreciate neurology recommendations.  EEG was suggestive of  moderate diffuse encephalopathy.   UTI-Final urine culture pending.  Treated with IV ceftriaxone. DC on oral cefdnir to complete 3 more days of therapy.   Type 2 diabetes mellitus-CBG remained stable and controlled.  Resume prior home therapies.      Chronic debilitated state.  Patient is long-term resident of SNF and will return when medically stabilized.  Recommend palliative care services.    Hyperlipidemia-resume home statin treatment.   Essential hypertension-blood pressure stable on current regimen.   Chronic iron deficiency anemia and vitamin B12 deficiency-resume home supplements.   Dementia without behavioral disturbance-he seems to be slowly returning to baseline.   DVT prophylaxis: Enoxaparin Code Status: DNR Family Communication: Not applicable Disposition: Return to SNF Status is: Inpatient  Discharge Diagnoses:  Principal Problem:   Seizure ([place]) Active Problems:   Essential hypertension   Iron deficiency anemia due to chronic blood loss   atrophic Gastritis   Dementia without behavioral disturbance (HCC)   Pressure injury of skin   H/O colon cancer, stage III   Acute metabolic encephalopathy   Mastoiditis, right: Per CT head  Discharge Instructions:  Allergies as of 05/30/2021       Reactions   Hydrocodone    Hydrocodone-acetaminophen    unknown   Aplisol [tuberculin Ppd] Rash        Medication List     TAKE these medications    acetaminophen 650 MG CR tablet Commonly known as: TYLENOL Take 1 tablet (650 mg total) by mouth every 8 (eight) hours as needed for pain or fever.   allopurinol 100 MG tablet Commonly known as: ZYLOPRIM Take 100 mg by mouth daily.   atorvastatin 10 MG tablet Commonly known as: LIPITOR Take 5 mg by mouth at bedtime.   cefdinir 300 MG capsule Commonly known as: OMNICEF Take 1 capsule (300 mg total) by mouth 2 (two) times daily for 3 days.   cholecalciferol 25 MCG (1000 UNIT) tablet Commonly known as: VITAMIN D3 Take 1,000 Units by mouth daily.   clobetasol cream 0.05 % Commonly known as: TEMOVATE Apply 1 application topically 2 (two) times daily.   cycloSPORINE 0.05 % ophthalmic emulsion Commonly known as: RESTASIS Place 1 drop into both eyes 2 (two)  times daily.   Eucerin Eczema Relief 1 % Crea Generic drug: Colloidal Oatmeal Apply 1 application topically 2 (two) times daily. Apply to face and neck   feeding supplement Liqd Take 237 mLs by mouth 2 (two) times daily between meals.   furosemide 20 MG tablet Commonly known as: LASIX Take 1 tablet (20 mg total) by mouth every other day. Start taking on: June 01, 2021   ketoconazole 2 % shampoo Commonly known as: NIZORAL Apply 1 application topically every 12 (twelve) hours as needed for irritation. Apply to Scalp   levETIRAcetam 500 MG tablet Commonly known as: KEPPRA Take 1 tablet (500 mg total) by mouth 2 (two) times daily.   metFORMIN 500 MG tablet Commonly known as: GLUCOPHAGE Take 500 mg by mouth at bedtime.   metoCLOPramide 5 MG tablet Commonly known as: REGLAN Take 1 tablet (5 mg total) by mouth 3 (three) times daily before meals.   multivitamin tablet Take 1 tablet by mouth daily.   ondansetron 4 MG tablet Commonly known as: ZOFRAN Take 4 mg by mouth every 6 (six) hours as needed for nausea or vomiting.   polyethylene glycol 17 g packet Commonly known as: MIRALAX / GLYCOLAX Take 17 g by mouth daily as needed for mild constipation.  potassium chloride 10 MEQ tablet Commonly known as: KLOR-CON Take 1 tablet (10 mEq total) by mouth every other day. Start taking on: June 01, 2021   vitamin B-12 1000 MCG tablet Commonly known as: CYANOCOBALAMIN Take 1,000 mcg by mouth daily.   VITAMIN C PO Take 1 tablet by mouth daily.        Contact information for after-discharge care     [facility] .   Service: Skilled Nursing Contact information: [place] [phone]                    Allergies  Allergen Reactions   Hydrocodone    Hydrocodone-Acetaminophen     unknown   Aplisol [Tuberculin Ppd] Rash   Allergies as of 05/30/2021       Reactions    Hydrocodone    Hydrocodone-acetaminophen    unknown   Aplisol [tuberculin Ppd] Rash        Medication List     TAKE these medications    acetaminophen 650 MG CR tablet Commonly known as: TYLENOL Take 1 tablet (650 mg total) by mouth every 8 (eight) hours as needed for pain or fever.   allopurinol 100 MG tablet Commonly known as: ZYLOPRIM Take 100 mg by mouth daily.   atorvastatin 10 MG tablet Commonly known as: LIPITOR Take 5 mg by mouth at bedtime.   cefdinir 300 MG capsule Commonly known as: OMNICEF Take 1 capsule (300 mg total) by mouth 2 (two) times daily for 3 days.   cholecalciferol 25 MCG (1000 UNIT) tablet Commonly known as: VITAMIN D3 Take 1,000 Units by mouth daily.   clobetasol cream 0.05 % Commonly known as: TEMOVATE Apply 1 application topically 2 (two) times daily.   cycloSPORINE 0.05 % ophthalmic emulsion Commonly known as: RESTASIS Place 1 drop into both eyes 2 (two) times daily.   Eucerin Eczema Relief 1 % Crea Generic drug: Colloidal Oatmeal Apply 1 application topically 2 (two) times daily. Apply to face and neck   feeding supplement Liqd Take 237 mLs by mouth 2 (two) times daily between meals.   furosemide 20 MG tablet Commonly known as: LASIX Take 1 tablet (20 mg total) by mouth every other day. Start taking on: June 01, 2021   ketoconazole 2 % shampoo Commonly known as: NIZORAL Apply 1 application topically every 12 (twelve) hours as needed for irritation. Apply to Scalp   levETIRAcetam 500 MG tablet Commonly known as: KEPPRA Take 1 tablet (500 mg total) by mouth 2 (two) times daily.   metFORMIN 500 MG tablet Commonly known as: GLUCOPHAGE Take 500 mg by mouth at bedtime.   metoCLOPramide 5 MG tablet Commonly known as: REGLAN Take 1 tablet (5 mg total) by mouth 3 (three) times daily before meals.   multivitamin tablet Take 1 tablet by mouth daily.   ondansetron 4 MG tablet Commonly known as: ZOFRAN Take 4 mg by  mouth every 6 (six) hours as needed for nausea or vomiting.   polyethylene glycol 17 g packet Commonly known as: MIRALAX / GLYCOLAX Take 17 g by mouth daily as needed for mild constipation.   potassium chloride 10 MEQ tablet Commonly known as: KLOR-CON Take 1 tablet (10 mEq total) by mouth every other day. Start taking on: June 01, 2021   vitamin B-12 1000 MCG tablet Commonly known as: CYANOCOBALAMIN Take 1,000 mcg by mouth daily.   VITAMIN C PO Take 1 tablet by  mouth daily.        Procedures/Studies: MR BRAIN WO CONTRAST  Result Date: 05/28/2021 CLINICAL DATA:  Seizure, abnormal neuro exam. Altered mental status and slurred speech. EXAM: MRI HEAD WITHOUT CONTRAST TECHNIQUE: Multiplanar, multiecho pulse sequences of the brain and surrounding structures were obtained without intravenous contrast. COMPARISON:  Head CT 05/28/2021 and MRI 09/07/2020 FINDINGS: The study is mildly motion degraded. Brain: There is no evidence of an acute infarct, intracranial hemorrhage, mass, midline shift, or extra-axial fluid collection. Mild trace diffusion weighted signal hyperintensity involving bilateral cerebral hemispheric white matter extending along the corticospinal tracts through the posterior limbs of the internal capsules and into the midbrain as well as in the splenium of the corpus callosum is chronic based on the prior MRI. There is also mild diffusion weighted signal abnormality in the superior and middle cerebellar peduncles. Susceptibility artifact in the basal ganglia and white matter correspond to longstanding calcifications on CT. Patchy T2 hyperintensities in the cerebral white matter bilaterally are nonspecific but compatible with mild-to-moderate chronic small vessel ischemic disease. The ventricles and sulci are within normal limits for age. Vascular: Major intracranial vascular flow voids are preserved. Skull and upper cervical spine: No suspicious marrow lesion. Sinuses/Orbits:  Bilateral cataract extraction. Clear paranasal sinuses. Large right mastoid and middle ear effusions. Other: None. IMPRESSION: 1. No acute intracranial abnormality. 2. Mild-to-moderate chronic small vessel ischemic disease. 3. Large right mastoid and middle ear effusions. Electronically Signed   By: [place] Bores M.D.   On: 05/28/2021 18:06   DG Chest Port 1 View  Result Date: 05/28/2021 CLINICAL DATA:  Evaluate for infection EXAM: PORTABLE CHEST 1 VIEW COMPARISON:  Chest radiograph 03/03/2021 FINDINGS: The heart is enlarged. The mediastinal contours are stable, allowing for rightward patient rotation. The lung volumes are low. There is asymmetric elevation of the right hemidiaphragm. There is no focal consolidation. There is mild vascular congestion without overt pulmonary edema. There is no pleural effusion or pneumothorax. There is no acute osseous abnormality. IMPRESSION: Cardiomegaly and low lung volumes. Otherwise, no radiographic evidence of acute cardiopulmonary process. Electronically Signed   By: Valetta Mole M.D.   On: 05/28/2021 14:05   EEG adult  Result Date: 05/29/2021 Lora Havens, MD     05/29/2021  2:52 PM Patient Name: ELDREDGE VELDHUIZEN MRN: [id number] Epilepsy Attending: Lora Havens Referring Physician/Provider: Dr Irine Seal Date: 05/29/2021 Duration: 23.07 mins Patient history: 75 year old male with altered mental status.  EEG to evaluate for seizures. Level of alertness: Awake, asleep AEDs during EEG study: LEV Technical aspects: This EEG study was done with scalp electrodes positioned according to the 10-20 International system of electrode placement. Electrical activity was acquired at a sampling rate of 500Hz  and reviewed with a high frequency filter of 70Hz  and a low frequency filter of 1Hz . EEG data were recorded continuously and digitally stored. Description: The posterior dominant rhythm consists of 8 Hz activity of moderate voltage (25-35 uV) seen predominantly in posterior  head regions, symmetric and reactive to eye opening and eye closing. Sleep was characterized by sleep spindles (12 to 14 Hz), maximal frontocentral region. EEG showed continuous generalized 5 to 7 Hz theta as well as intermittent generalized 2 to 3 Hz delta slowing. Hyperventilation and photic stimulation were not performed.   ABNORMALITY - Continuous slow, generalized IMPRESSION: This study is suggestive of moderate diffuse encephalopathy, nonspecific etiology. No seizures or epileptiform discharges were seen throughout the recording. [place]   CT HEAD CODE STROKE WO CONTRAST  Result Date: 05/28/2021 CLINICAL DATA:  Code stroke.  Neuro deficit, acute, stroke suspected EXAM: CT HEAD WITHOUT CONTRAST TECHNIQUE: Contiguous axial images were obtained from the base of the skull through the vertex without intravenous contrast. COMPARISON:  Multiple priors, most recent 09/11/2020. FINDINGS: Brain: No evidence of acute infarction, hemorrhage, hydrocephalus, extra-axial collection or mass lesion/mass effect. Similar calcification in bilateral basal ganglia and white matter. Vascular: No hyperdense vessel identified. Skull: No acute fracture. Sinuses/Orbits: Clear sinuses.  No acute orbital findings. Other: Large right mastoid effusion. ASPECTS [facility] Stroke Program Early CT Score) Total score (0-10 with 10 being normal): 10. IMPRESSION: 1. No evidence of acute large vascular territory infarct or acute hemorrhage. ASPECTS is 10. 2. Chronic basal ganglia and white matter calcifications. 3. Large right mastoid effusion with middle ear fluid. Correlate with the presence or absence of signs/symptoms of otomastoiditis. Code stroke imaging results were communicated on 05/28/2021 at 12:01 pm to provider [place] via telephone. Electronically Signed   By: Margaretha Sheffield M.D.   On: 05/28/2021 12:04     Subjective: Pt without complaints, seems back to his baseline. He is eating and drinking well.   Discharge  Exam: Vitals:   05/29/21 2110 05/30/21 0619  BP: (!) 108/54 (!) 108/59  Pulse: 81 78  Resp: 19 18  Temp: (!) 97.3 F (36.3 C) 97.8 F (36.6 C)  SpO2: 95% 97%   Vitals:   05/29/21 1342 05/29/21 2110 05/30/21 0549 05/30/21 0619  BP: 114/61 (!) 108/54  (!) 108/59  Pulse: 86 81  78  Resp: 16 19  18   Temp: 98.9 F (37.2 C) (!) 97.3 F (36.3 C)  97.8 F (36.6 C)  TempSrc: Oral Oral  Oral  SpO2: 100% 95%  97%  Weight:   101.8 kg   Height:       General: Pt is alert, awake, not in acute distress Cardiovascular: normal S1/S2 +, no rubs, no gallops Respiratory: CTA bilaterally, no wheezing, no rhonchi Abdominal: Soft, NT, ND, bowel sounds + Extremities: no edema, no cyanosis   The results of significant diagnostics from this hospitalization (including imaging, microbiology, ancillary and laboratory) are listed below for reference.     Microbiology: Recent Results (from the past 240 hour(s))  Resp Panel by RT-PCR (Flu A&B, Covid) Nasopharyngeal Swab     Status: None   Collection Time: 05/28/21 12:08 PM   Specimen: Nasopharyngeal Swab; Nasopharyngeal(NP) swabs in vial transport medium  Result Value Ref Range Status   SARS Coronavirus 2 by RT PCR NEGATIVE NEGATIVE Final    Comment: (NOTE) SARS-CoV-2 target nucleic acids are NOT DETECTED.  The SARS-CoV-2 RNA is generally detectable in upper respiratory specimens during the acute phase of infection. The lowest concentration of SARS-CoV-2 viral copies this assay can detect is 138 copies/mL. A negative result does not preclude SARS-Cov-2 infection and should not be used as the sole basis for treatment or other patient management decisions. A negative result may occur with  improper specimen collection/handling, submission of specimen other than nasopharyngeal swab, presence of viral mutation(s) within the areas targeted by this assay, and inadequate number of viral copies(<138 copies/mL). A negative result must be combined  with clinical observations, patient history, and epidemiological information. The expected result is Negative.  Fact Sheet for Patients:  EntrepreneurPulse.com.au  Fact Sheet for Healthcare Providers:  IncredibleEmployment.be  This test is no t yet approved or cleared by the [place] FDA and  has been authorized for detection and/or diagnosis of SARS-CoV-2 by FDA under  an Emergency Use Authorization (EUA). This EUA will remain  in effect (meaning this test can be used) for the duration of the COVID-19 declaration under Section 564(b)(1) of the Act, 21 U.S.C.section 360bbb-3(b)(1), unless the authorization is terminated  or revoked sooner.       Influenza A by PCR NEGATIVE NEGATIVE Final   Influenza B by PCR NEGATIVE NEGATIVE Final    Comment: (NOTE) The Xpert Xpress SARS-CoV-2/FLU/RSV plus assay is intended as an aid in the diagnosis of influenza from Nasopharyngeal swab specimens and should not be used as a sole basis for treatment. Nasal washings and aspirates are unacceptable for Xpert Xpress SARS-CoV-2/FLU/RSV testing.  Fact Sheet for Patients: EntrepreneurPulse.com.au  Fact Sheet for Healthcare Providers: IncredibleEmployment.be  This test is not yet approved or cleared by the [place] FDA and has been authorized for detection and/or diagnosis of SARS-CoV-2 by FDA under an Emergency Use Authorization (EUA). This EUA will remain in effect (meaning this test can be used) for the duration of the COVID-19 declaration under Section 564(b)(1) of the Act, 21 U.S.C. section 360bbb-3(b)(1), unless the authorization is terminated or revoked.  Performed at [facility], [address], [place], [place] [address]   MRSA Next Gen by PCR, Nasal     Status: Abnormal   Collection Time: 05/29/21  6:55 AM   Specimen: Nasal Mucosa; Nasal Swab  Result Value Ref Range Status   MRSA by PCR Next Gen DETECTED  (A) NOT DETECTED Final    Comment: RESULT CALLED TO, READ BACK BY AND VERIFIED WITH: HUMPHRIES J. @ [phone] ON [id number] BY HENDERSON L. (NOTE) The GeneXpert MRSA Assay (FDA approved for NASAL specimens only), is one component of a comprehensive MRSA colonization surveillance program. It is not intended to diagnose MRSA infection nor to guide or monitor treatment for MRSA infections. Test performance is not FDA approved in patients less than 109 years old. Performed at [facility], [address], [place], [place] [address]   Culture, blood (routine x 2)     Status: None (Preliminary result)   Collection Time: 05/29/21 11:35 AM   Specimen: Right Antecubital; Blood  Result Value Ref Range Status   Specimen Description   Final    RIGHT ANTECUBITAL BOTTLES DRAWN AEROBIC AND ANAEROBIC   Special Requests   Final    Blood Culture results may not be optimal due to an excessive volume of blood received in culture bottles   Culture   Final    NO GROWTH < 24 HOURS Performed at [facility], [address] [place], [place] [address]    Report Status PENDING  Incomplete  Culture, blood (routine x 2)     Status: None (Preliminary result)   Collection Time: 05/29/21 11:45 AM   Specimen: BLOOD LEFT ARM  Result Value Ref Range Status   Specimen Description BLOOD LEFT ARM BOTTLES DRAWN AEROBIC AND ANAEROBIC  Final   Special Requests   Final    Blood Culture results may not be optimal due to an inadequate volume of blood received in culture bottles   Culture   Final    NO GROWTH < 24 HOURS Performed at [facility], [address], [place], [place] [address]    Report Status PENDING  Incomplete     Labs: BNP (last 3 results) No results for input(s): BNP in the last 8760 hours. Basic Metabolic Panel: Recent Labs  Lab 05/28/21 1147 05/29/21 0543 05/30/21 0756  NA 135  139 138 139  K 3.8  4.1 3.6 4.0  CL 100  99 101 103  CO2 29 27 26   GLUCOSE 105*  104* 95 102*  BUN 16  16 17 14    CREATININE 0.78  0.80 0.71 0.78  CALCIUM 9.1 8.9 8.8*  MG 1.9  --   --   PHOS 3.2  --   --    Liver Function Tests: Recent Labs  Lab 05/28/21 1147 05/29/21 0543  AST 25 21  ALT 25 22  ALKPHOS 110 103  BILITOT 0.5 1.0  PROT 7.2 6.8  ALBUMIN 3.7 3.6   No results for input(s): LIPASE, AMYLASE in the last 168 hours. No results for input(s): AMMONIA in the last 168 hours. CBC: Recent Labs  Lab 05/28/21 1147 05/29/21 0543  WBC 5.6 5.4  NEUTROABS 2.9  --   HGB 12.7*  13.3 12.1*  HCT 38.9*  39.0 38.0*  MCV 89.6 90.3  PLT 168 156   Cardiac Enzymes: No results for input(s): CKTOTAL, CKMB, CKMBINDEX, TROPONINI in the last 168 hours. BNP: Invalid input(s): POCBNP CBG: Recent Labs  Lab 05/29/21 1116 05/29/21 1615 05/29/21 2217 05/30/21 0714 05/30/21 1119  GLUCAP 100* 76 86 81 109*   D-Dimer No results for input(s): DDIMER in the last 72 hours. Hgb A1c Recent Labs    05/28/21 1147  HGBA1C 5.6   Lipid Profile No results for input(s): CHOL, HDL, LDLCALC, TRIG, CHOLHDL, LDLDIRECT in the last 72 hours. Thyroid function studies No results for input(s): TSH, T4TOTAL, T3FREE, THYROIDAB in the last 72 hours.  Invalid input(s): FREET3 Anemia work up Recent Labs    05/29/21 0543  VITAMINB12 255  FOLATE 8.1  FERRITIN 9*  TIBC 359  IRON 75   Urinalysis    Component Value Date/Time   COLORURINE YELLOW 05/29/2021 [place] 05/29/2021 0655   LABSPEC 1.020 05/29/2021 0655   PHURINE 6.0 05/29/2021 0655   GLUCOSEU NEGATIVE 05/29/2021 [place] 05/29/2021 0655   BILIRUBINUR NEGATIVE 05/29/2021 0655   KETONESUR NEGATIVE 05/29/2021 0655   PROTEINUR NEGATIVE 05/29/2021 0655   UROBILINOGEN 0.2 05/27/2010 2038   NITRITE POSITIVE (A) 05/29/2021 0655   LEUKOCYTESUR LARGE (A) 05/29/2021 0655   Sepsis Labs Invalid input(s): PROCALCITONIN,  WBC,  LACTICIDVEN Microbiology Recent Results (from the past 240 hour(s))  Resp Panel by RT-PCR  (Flu A&B, Covid) Nasopharyngeal Swab     Status: None   Collection Time: 05/28/21 12:08 PM   Specimen: Nasopharyngeal Swab; Nasopharyngeal(NP) swabs in vial transport medium  Result Value Ref Range Status   SARS Coronavirus 2 by RT PCR NEGATIVE NEGATIVE Final    Comment: (NOTE) SARS-CoV-2 target nucleic acids are NOT DETECTED.  The SARS-CoV-2 RNA is generally detectable in upper respiratory specimens during the acute phase of infection. The lowest concentration of SARS-CoV-2 viral copies this assay can detect is 138 copies/mL. A negative result does not preclude SARS-Cov-2 infection and should not be used as the sole basis for treatment or other patient management decisions. A negative result may occur with  improper specimen collection/handling, submission of specimen other than nasopharyngeal swab, presence of viral mutation(s) within the areas targeted by this assay, and inadequate number of viral copies(<138 copies/mL). A negative result must be combined with clinical observations, patient history, and epidemiological information. The expected result is Negative.  Fact Sheet for Patients:  EntrepreneurPulse.com.au  Fact Sheet for Healthcare Providers:  [employer]credible[employer]ployment.be  This test is no t yet approved or cleared by the [place] FDA and  has been  authorized for detection and/or diagnosis of SARS-CoV-2 by FDA under an Emergency Use Authorization (EUA). This EUA will remain  in effect (meaning this test can be used) for the duration of the COVID-19 declaration under Section 564(b)(1) of the Act, 21 U.S.C.section 360bbb-3(b)(1), unless the authorization is terminated  or revoked sooner.       Influenza A by PCR NEGATIVE NEGATIVE Final   Influenza B by PCR NEGATIVE NEGATIVE Final    Comment: (NOTE) The Xpert Xpress SARS-CoV-2/FLU/RSV plus assay is intended as an aid in the diagnosis of influenza from Nasopharyngeal swab specimens  and should not be used as a sole basis for treatment. Nasal washings and aspirates are unacceptable for Xpert Xpress SARS-CoV-2/FLU/RSV testing.  Fact Sheet for Patients: EntrepreneurPulse.com.au  Fact Sheet for Healthcare Providers: IncredibleEmployment.be  This test is not yet approved or cleared by the [place] FDA and has been authorized for detection and/or diagnosis of SARS-CoV-2 by FDA under an Emergency Use Authorization (EUA). This EUA will remain in effect (meaning this test can be used) for the duration of the COVID-19 declaration under Section 564(b)(1) of the Act, 21 U.S.C. section 360bbb-3(b)(1), unless the authorization is terminated or revoked.  Performed at [facility], [address] [place], [place] [address]   MRSA Next Gen by PCR, Nasal     Status: Abnormal   Collection Time: 05/29/21  6:55 AM   Specimen: Nasal Mucosa; Nasal Swab  Result Value Ref Range Status   MRSA by PCR Next Gen DETECTED (A) NOT DETECTED Final    Comment: RESULT CALLED TO, READ BACK BY AND VERIFIED WITH: HUMPHRIES J. @ [id number] ON [id number] BY HENDERSON L. (NOTE) The GeneXpert MRSA Assay (FDA approved for NASAL specimens only), is one component of a comprehensive MRSA colonization surveillance program. It is not intended to diagnose MRSA infection nor to guide or monitor treatment for MRSA infections. Test performance is not FDA approved in patients less than 24 years old. Performed at [facility], [address], [place], [place] [address]   Culture, blood (routine x 2)     Status: None (Preliminary result)   Collection Time: 05/29/21 11:35 AM   Specimen: Right Antecubital; Blood  Result Value Ref Range Status   Specimen Description   Final    RIGHT ANTECUBITAL BOTTLES DRAWN AEROBIC AND ANAEROBIC   Special Requests   Final    Blood Culture results may not be optimal due to an excessive volume of blood received in culture bottles   Culture   Final     NO GROWTH < 24 HOURS Performed at [facility], [address] [place], [place] [address]    Report Status PENDING  Incomplete  Culture, blood (routine x 2)     Status: None (Preliminary result)   Collection Time: 05/29/21 11:45 AM   Specimen: BLOOD LEFT ARM  Result Value Ref Range Status   Specimen Description BLOOD LEFT ARM BOTTLES DRAWN AEROBIC AND ANAEROBIC  Final   Special Requests   Final    Blood Culture results may not be optimal due to an inadequate volume of blood received in culture bottles   Culture   Final    NO GROWTH < 24 HOURS Performed at [facility], [address] [place], [place] [address]    Report Status PENDING  Incomplete   Time coordinating discharge: 38 mins   SIGNED:  Irwin Brakeman, MD  [facility] 05/30/2021, 12:32 PM How to contact the [facility] Attending or Consulting provider 7A - 7P  or covering provider during after hours [place], for this patient?  Check the care team in [facility] and look for a) attending/consulting TRH provider listed and b) the [facility] team listed Log into [email or web address] and use [place]'s universal password to access. If you do not have the password, please contact the hospital operator. Locate the [facility] provider you are looking for under [facility] and page to a number that you can be directly reached. If you still have difficulty reaching the provider, please page the [facility] (Director on Call) for the Hospitalists listed on amion for assistance.

## 2021-05-30 NOTE — TOC Transition Note (Signed)
Transition of Care [facility]) - CM/SW Discharge Note   Patient Details  Name: [name] MRN: [id number] Date of Birth: [birth year]  [facility] CM/SW Contact:  Natasha Bence, LCSW Phone Number: 05/30/2021, 2:36 PM   Clinical Narrative:    CSW notified of patient's readiness for discharge. Debbie with [name] agreeable to take patient back. CSW scheduled transport with [facility] and completed med necessity Nurse to call report. TOC to follow..    Final next level of care: Long Term Nursing Home Barriers to Discharge: Barriers Resolved   Patient Goals and CMS Choice Patient states their goals for this hospitalization and ongoing recovery are:: Return to LTC CMS Medicare.gov Compare Post Acute Care list provided to:: Patient    Discharge Placement                Patient to be transferred to facility by: [facility] Name of family member notified: Claudette Laws (Legal Guardian)   [phone] Patient and family notified of of transfer: 05/30/21  Discharge Plan and Services                                     Social Determinants of Health (SDOH) Interventions     Readmission Risk Interventions Readmission Risk Prevention Plan 05/15/2019  Post Dischage Appt Not Complete  Appt Comments Pt returning to SNF. SNF MD will follow up at the facility  Medication Screening Complete  Transportation Screening Complete  Some recent data might be hidden

## 2021-05-30 NOTE — Discharge Instructions (Signed)
IMPORTANT INFORMATION: PAY CLOSE ATTENTION   PHYSICIAN DISCHARGE INSTRUCTIONS  Follow with Primary care provider  Caprice Renshaw, MD  and other consultants as instructed by your Hospitalist Physician  [place] IF SYMPTOMS COME BACK, WORSEN OR NEW PROBLEM DEVELOPS   Please note: You were cared for by a hospitalist during your hospital stay. Every effort will be made to forward records to your primary care provider.  You can request that your primary care provider send for your hospital records if they have not received them.  Once you are discharged, your primary care physician will handle any further medical issues. Please note that NO REFILLS for any discharge medications will be authorized once you are discharged, as it is imperative that you return to your primary care physician (or establish a relationship with a primary care physician if you do not have one) for your post hospital discharge needs so that they can reassess your need for medications and monitor your lab values.  Please get a complete blood count and chemistry panel checked by your Primary MD at your next visit, and again as instructed by your Primary MD.  Get Medicines reviewed and adjusted: Please take all your medications with you for your next visit with your Primary MD  Laboratory/radiological data: Please request your Primary MD to go over all hospital tests and procedure/radiological results at the follow up, please ask your primary care provider to get all Hospital records sent to his/her office.  In some cases, they will be blood work, cultures and biopsy results pending at the time of your discharge. Please request that your primary care provider follow up on these results.  If you are diabetic, please bring your blood sugar readings with you to your follow up appointment with primary care.    Please call and make your follow up appointments as soon as possible.    Also Note the  following: If you experience worsening of your admission symptoms, develop shortness of breath, life threatening emergency, suicidal or homicidal thoughts you must seek medical attention immediately by calling 911 or calling your MD immediately  if symptoms less severe.  You must read complete instructions/literature along with all the possible adverse reactions/side effects for all the Medicines you take and that have been prescribed to you. Take any new Medicines after you have completely understood and accpet all the possible adverse reactions/side effects.   Do not drive when taking Pain medications or sleeping medications (Benzodiazepines)  Do not take more than prescribed Pain, Sleep and Anxiety Medications. It is not advisable to combine anxiety,sleep and pain medications without talking with your primary care practitioner  Special Instructions: If you have smoked or chewed Tobacco  in the last 2 yrs please stop smoking, stop any regular Alcohol  and or any Recreational drug use.  Wear Seat belts while driving.  Do not drive if taking any narcotic, mind altering or controlled substances or recreational drugs or alcohol.

## 2021-05-30 NOTE — Progress Notes (Signed)
This nurse attempted to call report to [facility] several times, and once I was transferred to the nurses station no one will answer. Notified SW and MD.

## 2021-05-31 LAB — URINE CULTURE: Culture: <10000 — AB

## 2021-06-03 LAB — CULTURE, BLOOD (ROUTINE X 2)
Culture: NO GROWTH
Culture: NO GROWTH

## 2021-06-07 ENCOUNTER — Emergency Department (HOSPITAL_COMMUNITY)
Admission: EM | Admit: 2021-06-07 | Discharge: 2021-06-08 | Disposition: A | Discharge status: Home / Self Care | Payer: Medicare (Managed Care) | Attending: Emergency Medicine | Admitting: Emergency Medicine

## 2021-06-07 DIAGNOSIS — R339 Retention of urine, unspecified: Secondary | ICD-10-CM | POA: Diagnosis present

## 2021-06-07 DIAGNOSIS — Z85038 Personal history of other malignant neoplasm of large intestine: Secondary | ICD-10-CM | POA: Diagnosis not present

## 2021-06-07 DIAGNOSIS — I10 Essential (primary) hypertension: Secondary | ICD-10-CM | POA: Diagnosis not present

## 2021-06-07 DIAGNOSIS — Z87891 Personal history of nicotine dependence: Secondary | ICD-10-CM | POA: Insufficient documentation

## 2021-06-07 DIAGNOSIS — Z79899 Other long term (current) drug therapy: Secondary | ICD-10-CM | POA: Diagnosis not present

## 2021-06-07 DIAGNOSIS — Z7984 Long term (current) use of oral hypoglycemic drugs: Secondary | ICD-10-CM | POA: Diagnosis not present

## 2021-06-07 DIAGNOSIS — E119 Type 2 diabetes mellitus without complications: Secondary | ICD-10-CM | POA: Diagnosis not present

## 2021-06-07 DIAGNOSIS — F0390 Unspecified dementia without behavioral disturbance: Secondary | ICD-10-CM | POA: Insufficient documentation

## 2021-06-07 LAB — LACTIC ACID, PLASMA: Lactic Acid, Venous: 1.4 mmol/L (ref 0.5–1.9)

## 2021-06-07 LAB — COMPREHENSIVE METABOLIC PANEL
ALT: 17 U/L (ref 0–44)
AST: 21 U/L (ref 15–41)
Albumin: 3.1 g/dL — ABNORMAL LOW (ref 3.5–5.0)
Alkaline Phosphatase: 94 U/L (ref 38–126)
Anion gap: 4 — ABNORMAL LOW (ref 5–15)
BUN: 18 mg/dL (ref 8–23)
CO2: 26 mmol/L (ref 22–32)
Calcium: 7.6 mg/dL — ABNORMAL LOW (ref 8.9–10.3)
Chloride: 109 mmol/L (ref 98–111)
Creatinine, Ser: 0.62 mg/dL (ref 0.61–1.24)
GFR, Estimated: >60 mL/min (ref >60)
Glucose, Bld: 114 mg/dL — ABNORMAL HIGH (ref 70–99)
Potassium: 3.2 mmol/L — ABNORMAL LOW (ref 3.5–5.1)
Sodium: 139 mmol/L (ref 135–145)
Total Bilirubin: 0.7 mg/dL (ref 0.3–1.2)
Total Protein: 6.1 g/dL — ABNORMAL LOW (ref 6.5–8.1)

## 2021-06-07 LAB — CBC WITH DIFFERENTIAL/PLATELET
Abs Immature Granulocytes: 0.02 10*3/uL (ref 0.00–0.07)
Basophils Absolute: 0 10*3/uL (ref 0.0–0.1)
Basophils Relative: 1 %
Eosinophils Absolute: 0.2 10*3/uL (ref 0.0–0.5)
Eosinophils Relative: 3 %
HCT: 34.1 % — ABNORMAL LOW (ref 39.0–52.0)
Hemoglobin: 10.6 g/dL — ABNORMAL LOW (ref 13.0–17.0)
Immature Granulocytes: 0 %
Lymphocytes Relative: 26 %
Lymphs Abs: 1.6 10*3/uL (ref 0.7–4.0)
MCH: 28.3 pg (ref 26.0–34.0)
MCHC: 31.1 g/dL (ref 30.0–36.0)
MCV: 91.2 fL (ref 80.0–100.0)
Monocytes Absolute: 0.7 10*3/uL (ref 0.1–1.0)
Monocytes Relative: 11 %
Neutro Abs: 3.8 10*3/uL (ref 1.7–7.7)
Neutrophils Relative %: 59 %
Platelets: 142 10*3/uL — ABNORMAL LOW (ref 150–400)
RBC: 3.74 MIL/uL — ABNORMAL LOW (ref 4.22–5.81)
RDW: 13.4 % (ref 11.5–15.5)
WBC: 6.4 10*3/uL (ref 4.0–10.5)
nRBC: 0 % (ref 0.0–0.2)

## 2021-06-07 NOTE — ED Provider Notes (Signed)
[facility] EMERGENCY DEPARTMENT Provider Note   CSN: [id number] Arrival date & time: 06/07/21  2231     History Chief Complaint  Patient presents with   Urinary Tract Infection    [name] is a 75 y.o. male.  Patient sent to the emergency department from skilled nursing facility.  Nursing facility reports that he has had decreased activity today.  Patient was recently hospitalized for urinary tract infection and facility was concerned about the possibility of recurrence.  Patient with history of dementia, level 5 caveat.      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   Dementia    disoriented to time.does not know medical hx.information obtained from previous charts   Diabetes mellitus without complication ([place])    GERD (gastroesophageal reflux disease)    Hard of hearing    Hyperlipemia    Hypertension    Iron deficiency anemia    Obesity    SOB (shortness of breath) on exertion     Patient Active Problem List   Diagnosis Date Noted   Seizure ([place]) 37/06/6268   Acute metabolic encephalopathy 48/54/6270   Mastoiditis, right: Per CT head 05/28/2021   Sepsis ([place]) 03/03/2021   Aspiration pneumonia of both lower lobes due to gastric secretions ([place]) 03/03/2021   SBO (small bowel obstruction) ([employer]) 03/03/2021   Acute lower UTI 03/03/2021   H/O colon cancer, stage III 12/30/2020   Elevated LFTs    Pressure injury of skin 09/07/2020   Bacteremia due to Klebsiella pneumoniae 09/06/2020   AKI (acute kidney injury) ([place]) 09/06/2020   Lactic acidosis 09/06/2020   Mild protein-calorie malnutrition ([place]) 09/06/2020   Septic shock ([place]) 09/06/2020   Cecal cancer ([place])    Colonic mass 05/09/2019   atrophic Gastritis 05/08/2019   Abdominal pain 05/08/2019   Dementia without behavioral disturbance ([facility])    SOB (shortness of breath) on exertion    Hypotension     Acute blood loss anemia 05/01/2018   Heme positive stool 05/01/2018   Guaiac positive stools    Iron deficiency anemia due to chronic blood loss    GIB (gastrointestinal bleeding) 04/30/2018   History of Bleeding ulcer    Essential hypertension    Encounter for screening colonoscopy 10/08/2011   CLOSED DISLOCATION OF FOOT UNSPECIFIED PART 01/15/2010    Past Surgical History:  Procedure Laterality Date   BIOPSY  05/09/2019   Procedure: BIOPSY;  Surgeon: Daneil Dolin, MD;  [facility];  Service: Endoscopy;;   COLONOSCOPY  10/06/06   internal hemorrhoids and anal papilla/poor prep   COLONOSCOPY N/A 05/09/2019   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 Biopsy with atrophic gastritis.    FOOT SURGERY     KNEE ARTHROSCOPY  06/17/2011   Procedure: ARTHROSCOPY KNEE;  Surgeon: Sanjuana Kava;  Location: [facility];  Service: Orthopedics;  Laterality: Right;   LAPAROSCOPIC PARTIAL COLECTOMY  N/A 05/11/2019   Procedure: ATTEMPTED LAPAROSCOPIC PARTIAL COLECTOMY;  Surgeon: [name], MD;  Location: [facility];  Service: General;  Laterality: N/A;   PARTIAL COLECTOMY  05/11/2019   Procedure: PARTIAL COLECTOMY (Laparotomy incision at [place];  Surgeon: [name], MD;  Location: [facility];  Service: General;;   POLYPECTOMY  05/09/2019   Procedure: POLYPECTOMY;  Surgeon: Daneil Dolin, MD;  [facility];  Service: Endoscopy;;        Family History  Problem Relation Age of Onset   COPD Mother    Cancer Brother    Anesthesia problems Neg Hx    Hypotension Neg Hx    Malignant hyperthermia Neg Hx    Pseudochol deficiency Neg Hx    Colon cancer Neg Hx     Social History   Tobacco Use   Smoking status: Former    Packs/day: 1.00    Years: 30.00    Pack years: 30.00    Types: Cigarettes   Smokeless tobacco: Never   Tobacco comments:    quit a "long time ago"   Vaping Use   Vaping Use: Never used  Substance Use Topics   Alcohol use: No   Drug use: No    Home Medications Prior to Admission medications   Medication Sig Start Date End Date Taking? Authorizing Provider  acetaminophen (TYLENOL) 650 MG CR tablet Take 1 tablet (650 mg total) by mouth every 8 (eight) hours as needed for pain or fever. 09/11/20   Hongalgi, Lenis Dickinson, MD  allopurinol (ZYLOPRIM) 100 MG tablet Take 100 mg by mouth daily.    [provider]  Ascorbic Acid (VITAMIN C PO) Take 1 tablet by mouth daily.    [provider]  atorvastatin (LIPITOR) 10 MG tablet Take 5 mg by mouth at bedtime.     [provider]  cholecalciferol (VITAMIN D3) 25 MCG (1000 UNIT) tablet Take 1,000 Units by mouth daily.    [provider]  clobetasol cream (TEMOVATE) 0.53 % Apply 1 application topically 2 (two) times daily.    [provider]  Colloidal Oatmeal (EUCERIN ECZEMA RELIEF) 1 % CREA Apply 1 application topically 2 (two) times daily. Apply to face and neck    [provider]  cycloSPORINE (RESTASIS) 0.05 % ophthalmic emulsion Place 1 drop into both eyes 2 (two) times daily.    [provider]  feeding supplement (ENSURE ENLIVE / ENSURE PLUS) LIQD Take 237 mLs by mouth 2 (two) times daily between meals. 09/11/20   Hongalgi, Lenis Dickinson, MD  furosemide (LASIX) 20 MG tablet Take 1 tablet (20 mg total) by mouth every other day. 06/01/21   Johnson, Clanford L, MD  ketoconazole (NIZORAL) 2 % shampoo  Apply 1 application topically every 12 (twelve) hours as needed for irritation. Apply to Scalp    [provider]  levETIRAcetam (KEPPRA) 500 MG tablet Take 1 tablet (500 mg total) by mouth 2 (two) times daily. 05/30/21   Johnson, Clanford L, MD  metFORMIN (GLUCOPHAGE) 500 MG tablet Take 500 mg by mouth at bedtime.    [provider]  metoCLOPramide (REGLAN) 5 MG tablet Take 1 tablet (5 mg total) by mouth 3 (three) times daily before meals. 03/09/21   Murlean Iba, MD  Multiple Vitamin (MULTIVITAMIN) tablet Take 1 tablet by mouth daily.    [provider]  ondansetron (ZOFRAN) 4 MG tablet Take 4 mg by mouth every 6 (six) hours as needed for nausea or vomiting.  [provider]  polyethylene glycol (MIRALAX / GLYCOLAX) 17 g packet Take 17 g by mouth daily as needed for mild constipation. 03/09/21   Johnson, Clanford L, MD  potassium chloride (KLOR-CON) 10 MEQ tablet Take 1 tablet (10 mEq total) by mouth every other day. 06/01/21   Johnson, Clanford L, MD  vitamin B-12 (CYANOCOBALAMIN) 1000 MCG tablet Take 1,000 mcg by mouth daily.    [provider]    Allergies    Hydrocodone, Hydrocodone-acetaminophen, and Aplisol [tuberculin ppd]  Review of Systems   Review of Systems  Unable to perform ROS: Dementia   Physical Exam Updated Vital Signs BP (!) 146/74   Pulse 95   Temp 98 F (36.7 C) (Rectal)   Resp 19   SpO2 98%   Physical Exam Constitutional:      Appearance: He is obese. He is ill-appearing.  HENT:     Head: Atraumatic.  Eyes:     Pupils: Pupils are equal, round, and reactive to light.  Cardiovascular:     Rate and Rhythm: Normal rate and regular rhythm.  Pulmonary:     [place]: Pulmonary effort is normal.     Breath sounds: Normal breath sounds.  Abdominal:     Palpations: Abdomen is soft.  Musculoskeletal:        General: No deformity.     Cervical back: Neck supple.  Skin:    Coloration: Skin is pale.  Neurological:      General: No focal deficit present.     Mental Status: He is alert. He is disoriented.     Cranial Nerves: No cranial nerve deficit.    ED Results / Procedures / Treatments   Labs (all labs ordered are listed, but only abnormal results are displayed) Labs Reviewed  COMPREHENSIVE METABOLIC PANEL - Abnormal; Notable for the following components:      Result Value   Potassium 3.2 (*)    Glucose, Bld 114 (*)    Calcium 7.6 (*)    Total Protein 6.1 (*)    Albumin 3.1 (*)    Anion gap 4 (*)    All other components within normal limits  CBC WITH DIFFERENTIAL/PLATELET - Abnormal; Notable for the following components:   RBC 3.74 (*)    Hemoglobin 10.6 (*)    HCT 34.1 (*)    Platelets 142 (*)    All other components within normal limits  URINALYSIS, ROUTINE W REFLEX MICROSCOPIC - Abnormal; Notable for the following components:   Leukocytes,Ua TRACE (*)    All other components within normal limits  URINALYSIS, MICROSCOPIC (REFLEX) - Abnormal; Notable for the following components:   Bacteria, UA RARE (*)    All other components within normal limits  LACTIC ACID, PLASMA  LACTIC ACID, PLASMA    EKG None  Radiology CT HEAD WO CONTRAST (5MM)  Result Date: 06/08/2021 CLINICAL DATA:  Altered mental status, UTI, history of cecal cancer EXAM: CT HEAD WITHOUT CONTRAST TECHNIQUE: Contiguous axial images were obtained from the base of the skull through the vertex without intravenous contrast. COMPARISON:  CT head dated 05/28/2021 FINDINGS: Brain: No evidence of acute infarction, hemorrhage, hydrocephalus, extra-axial collection or mass lesion/mass effect. Subcortical white matter and periventricular small vessel ischemic changes. Basal ganglia and white matter calcifications, unchanged. Vascular: Intracranial atherosclerosis. Skull: Normal. Negative for fracture or focal lesion. Hyperostosis frontalis. Sinuses/Orbits: Visualized paranasal sinuses are clear. Right mastoid effusion, unchanged. Other:  None. IMPRESSION: No evidence of acute intracranial abnormality. Small vessel ischemic changes. Chronic right mastoid  effusion. Electronically Signed   By: Julian Hy M.D.   On: 06/08/2021 01:49   DG Chest Port 1 View  Result Date: 06/08/2021 CLINICAL DATA:  Weakness, altered mental status EXAM: PORTABLE CHEST 1 VIEW COMPARISON:  05/28/2021 FINDINGS: Lungs are clear.  No pleural effusion or pneumothorax. The heart is normal in size. IMPRESSION: No evidence of acute cardiopulmonary disease. Electronically Signed   By: Julian Hy M.D.   On: 06/08/2021 01:27    Procedures Procedures   Medications Ordered in ED Medications - No data to display  ED Course  I have reviewed the triage vital signs and the nursing notes.  Pertinent labs & imaging results that were available during my care of the patient were reviewed by me and considered in my medical decision making (see chart for details).    MDM Rules/Calculators/A&P                           Sent to the emergency department from skilled nursing facility to be evaluated for mental status change.  Patient does have a history of severe dementia at baseline.  Nursing home staff noted that he was not as active as usual.  At arrival to the ER he is awake and alert.  He answers simple questions.  He has no specific complaints.  He does not appear ill.    CT head, chest x-ray, blood work all very reassuring.  Patient was unable to give a urine sample.  Bladder scan showed acute urinary retention.  Bladder decompressed with Foley catheter.  Urinalysis without signs of infection.  Patient appropriate for return to nursing facility.  Final Clinical Impression(s) / ED Diagnoses Final diagnoses:  Urinary retention    Rx / DC Orders ED Discharge Orders     None        Anyae Griffith, Gwenyth Allegra, MD 06/08/21 [id number]

## 2021-06-07 NOTE — ED Triage Notes (Signed)
Patient arrives via [facility] from [facility] for UTI.  Patient was discharged from hospital earlier today and staff sent him back for possible UTI.

## 2021-06-07 NOTE — ED Notes (Signed)
One blood cx obtained at this time. Sent to lab to hold for orders.

## 2021-06-08 ENCOUNTER — Emergency Department (HOSPITAL_COMMUNITY): Payer: Medicare (Managed Care)

## 2021-06-08 DIAGNOSIS — R339 Retention of urine, unspecified: Secondary | ICD-10-CM | POA: Diagnosis not present

## 2021-06-08 LAB — URINALYSIS, ROUTINE W REFLEX MICROSCOPIC
Bilirubin Urine: NEGATIVE
Glucose, UA: NEGATIVE mg/dL
Hgb urine dipstick: NEGATIVE
Ketones, ur: NEGATIVE mg/dL
Nitrite: NEGATIVE
Protein, ur: NEGATIVE mg/dL
Specific Gravity, Urine: 1.02 (ref 1.005–1.030)
pH: 5 (ref 5.0–8.0)

## 2021-06-08 LAB — URINALYSIS, MICROSCOPIC (REFLEX)

## 2021-06-08 LAB — LACTIC ACID, PLASMA: Lactic Acid, Venous: 1.2 mmol/L (ref 0.5–1.9)

## 2021-06-08 NOTE — ED Notes (Signed)
C-com called at this time for pt to go back to [facility]

## 2021-06-11 ENCOUNTER — Ambulatory Visit: Payer: Medicare (Managed Care) | Admitting: Urology

## 2021-06-16 ENCOUNTER — Inpatient Hospital Stay (HOSPITAL_COMMUNITY): Payer: Medicare (Managed Care)

## 2021-06-16 ENCOUNTER — Ambulatory Visit (HOSPITAL_COMMUNITY): Payer: Medicare (Managed Care)

## 2021-06-17 NOTE — Progress Notes (Signed)
Patient on Hospice per Epic records.

## 2021-06-18 ENCOUNTER — Ambulatory Visit (HOSPITAL_COMMUNITY): Payer: Medicare (Managed Care) | Admitting: Hematology

## 2021-07-01 ENCOUNTER — Ambulatory Visit (HOSPITAL_COMMUNITY): Payer: Medicare (Managed Care)

## 2021-07-01 ENCOUNTER — Other Ambulatory Visit (HOSPITAL_COMMUNITY): Payer: Medicare (Managed Care)

## 2021-07-08 ENCOUNTER — Ambulatory Visit (HOSPITAL_COMMUNITY): Payer: Medicare (Managed Care) | Admitting: Hematology

## 2021-08-27 DEATH — deceased

## 2022-04-01 ENCOUNTER — Other Ambulatory Visit (HOSPITAL_COMMUNITY): Payer: Medicare (Managed Care)

## 2022-04-01 ENCOUNTER — Encounter (HOSPITAL_COMMUNITY): Payer: Self-pay | Admitting: Internal Medicine

## 2022-04-08 ENCOUNTER — Ambulatory Visit: Payer: Medicare (Managed Care) | Admitting: Urology
